# Patient Record
Sex: Female | Born: 1945 | Race: White | Hispanic: No | Smoking: Former smoker
Health system: Southern US, Community
[De-identification: ages and names within clinical notes are randomized; demographics above are authoritative.]

## PROBLEM LIST (undated history)

## (undated) DIAGNOSIS — I509 Heart failure, unspecified: Secondary | ICD-10-CM

## (undated) DIAGNOSIS — C801 Malignant (primary) neoplasm, unspecified: Secondary | ICD-10-CM

## (undated) DIAGNOSIS — I350 Nonrheumatic aortic (valve) stenosis: Secondary | ICD-10-CM

## (undated) DIAGNOSIS — I5033 Acute on chronic diastolic (congestive) heart failure: Secondary | ICD-10-CM

## (undated) DIAGNOSIS — M1990 Unspecified osteoarthritis, unspecified site: Secondary | ICD-10-CM

## (undated) DIAGNOSIS — I272 Pulmonary hypertension, unspecified: Secondary | ICD-10-CM

## (undated) DIAGNOSIS — R0602 Shortness of breath: Secondary | ICD-10-CM

## (undated) DIAGNOSIS — I340 Nonrheumatic mitral (valve) insufficiency: Secondary | ICD-10-CM

## (undated) DIAGNOSIS — E669 Obesity, unspecified: Secondary | ICD-10-CM

## (undated) DIAGNOSIS — J90 Pleural effusion, not elsewhere classified: Secondary | ICD-10-CM

## (undated) DIAGNOSIS — I50810 Right heart failure, unspecified: Secondary | ICD-10-CM

## (undated) DIAGNOSIS — G4733 Obstructive sleep apnea (adult) (pediatric): Secondary | ICD-10-CM

## (undated) DIAGNOSIS — R590 Localized enlarged lymph nodes: Secondary | ICD-10-CM

## (undated) DIAGNOSIS — I2510 Atherosclerotic heart disease of native coronary artery without angina pectoris: Secondary | ICD-10-CM

## (undated) DIAGNOSIS — J40 Bronchitis, not specified as acute or chronic: Secondary | ICD-10-CM

## (undated) DIAGNOSIS — E785 Hyperlipidemia, unspecified: Secondary | ICD-10-CM

## (undated) DIAGNOSIS — I071 Rheumatic tricuspid insufficiency: Secondary | ICD-10-CM

## (undated) DIAGNOSIS — E66811 Obesity, class 1: Secondary | ICD-10-CM

## (undated) DIAGNOSIS — I219 Acute myocardial infarction, unspecified: Secondary | ICD-10-CM

## (undated) DIAGNOSIS — J449 Chronic obstructive pulmonary disease, unspecified: Secondary | ICD-10-CM

## (undated) DIAGNOSIS — Z8719 Personal history of other diseases of the digestive system: Secondary | ICD-10-CM

## (undated) DIAGNOSIS — K219 Gastro-esophageal reflux disease without esophagitis: Secondary | ICD-10-CM

## (undated) DIAGNOSIS — F419 Anxiety disorder, unspecified: Secondary | ICD-10-CM

## (undated) DIAGNOSIS — J189 Pneumonia, unspecified organism: Secondary | ICD-10-CM

## (undated) HISTORY — PX: APPENDECTOMY: SHX54

## (undated) HISTORY — DX: Pulmonary hypertension, unspecified: I27.20

## (undated) HISTORY — DX: Localized enlarged lymph nodes: R59.0

## (undated) HISTORY — PX: HEMORRHOID SURGERY: SHX153

## (undated) HISTORY — DX: Chronic obstructive pulmonary disease, unspecified: J44.9

## (undated) HISTORY — PX: OTHER SURGICAL HISTORY: SHX169

## (undated) HISTORY — PX: LUMBAR DISC SURGERY: SHX700

## (undated) HISTORY — DX: Acute myocardial infarction, unspecified: I21.9

## (undated) HISTORY — DX: Obstructive sleep apnea (adult) (pediatric): G47.33

## (undated) HISTORY — DX: Hyperlipidemia, unspecified: E78.5

## (undated) HISTORY — PX: TONSILLECTOMY AND ADENOIDECTOMY: SUR1326

## (undated) HISTORY — PX: CORONARY ANGIOPLASTY: SHX604

## (undated) HISTORY — PX: EYE SURGERY: SHX253

---

## 2000-11-13 ENCOUNTER — Inpatient Hospital Stay (HOSPITAL_COMMUNITY): Admission: EM | Admit: 2000-11-13 | Discharge: 2000-11-15 | Payer: Self-pay | Admitting: Emergency Medicine

## 2000-11-13 ENCOUNTER — Encounter: Payer: Self-pay | Admitting: Emergency Medicine

## 2000-12-24 ENCOUNTER — Encounter (HOSPITAL_COMMUNITY): Admission: RE | Admit: 2000-12-24 | Discharge: 2001-03-24 | Payer: Self-pay | Admitting: Cardiovascular Disease

## 2001-01-13 ENCOUNTER — Other Ambulatory Visit: Admission: RE | Admit: 2001-01-13 | Discharge: 2001-01-13 | Payer: Self-pay

## 2001-01-20 ENCOUNTER — Encounter: Payer: Self-pay | Admitting: Family Medicine

## 2001-01-20 ENCOUNTER — Encounter: Admission: RE | Admit: 2001-01-20 | Discharge: 2001-01-20 | Payer: Self-pay | Admitting: Family Medicine

## 2001-03-16 ENCOUNTER — Encounter (HOSPITAL_COMMUNITY): Admission: RE | Admit: 2001-03-16 | Discharge: 2001-06-14 | Payer: Self-pay | Admitting: Cardiovascular Disease

## 2002-08-17 ENCOUNTER — Encounter: Payer: Self-pay | Admitting: Family Medicine

## 2002-08-17 ENCOUNTER — Other Ambulatory Visit: Admission: RE | Admit: 2002-08-17 | Discharge: 2002-08-17 | Payer: Self-pay | Admitting: Family Medicine

## 2002-08-17 ENCOUNTER — Encounter: Admission: RE | Admit: 2002-08-17 | Discharge: 2002-08-17 | Payer: Self-pay | Admitting: Family Medicine

## 2002-08-31 ENCOUNTER — Encounter: Payer: Self-pay | Admitting: Family Medicine

## 2003-07-03 ENCOUNTER — Inpatient Hospital Stay (HOSPITAL_COMMUNITY): Admission: EM | Admit: 2003-07-03 | Discharge: 2003-07-04 | Payer: Self-pay | Admitting: Emergency Medicine

## 2004-06-27 ENCOUNTER — Ambulatory Visit: Payer: Self-pay | Admitting: Family Medicine

## 2004-10-03 ENCOUNTER — Ambulatory Visit: Payer: Self-pay | Admitting: Family Medicine

## 2006-02-11 ENCOUNTER — Ambulatory Visit: Payer: Self-pay | Admitting: Family Medicine

## 2006-06-17 ENCOUNTER — Ambulatory Visit: Payer: Self-pay | Admitting: Family Medicine

## 2006-07-17 ENCOUNTER — Ambulatory Visit: Payer: Self-pay | Admitting: Family Medicine

## 2007-03-17 ENCOUNTER — Telehealth: Payer: Self-pay | Admitting: Family Medicine

## 2007-08-04 ENCOUNTER — Ambulatory Visit: Payer: Self-pay | Admitting: Family Medicine

## 2007-08-04 DIAGNOSIS — K219 Gastro-esophageal reflux disease without esophagitis: Secondary | ICD-10-CM

## 2007-08-04 DIAGNOSIS — R609 Edema, unspecified: Secondary | ICD-10-CM

## 2007-08-04 DIAGNOSIS — M1990 Unspecified osteoarthritis, unspecified site: Secondary | ICD-10-CM | POA: Insufficient documentation

## 2007-08-04 DIAGNOSIS — I10 Essential (primary) hypertension: Secondary | ICD-10-CM | POA: Insufficient documentation

## 2007-08-04 DIAGNOSIS — F411 Generalized anxiety disorder: Secondary | ICD-10-CM | POA: Insufficient documentation

## 2007-08-04 DIAGNOSIS — E785 Hyperlipidemia, unspecified: Secondary | ICD-10-CM | POA: Insufficient documentation

## 2007-08-04 DIAGNOSIS — I2510 Atherosclerotic heart disease of native coronary artery without angina pectoris: Secondary | ICD-10-CM | POA: Insufficient documentation

## 2007-08-12 ENCOUNTER — Ambulatory Visit: Payer: Self-pay | Admitting: Family Medicine

## 2007-08-13 ENCOUNTER — Telehealth (INDEPENDENT_AMBULATORY_CARE_PROVIDER_SITE_OTHER): Payer: Self-pay | Admitting: *Deleted

## 2007-08-13 ENCOUNTER — Ambulatory Visit: Payer: Self-pay | Admitting: Family Medicine

## 2007-08-14 ENCOUNTER — Telehealth (INDEPENDENT_AMBULATORY_CARE_PROVIDER_SITE_OTHER): Payer: Self-pay | Admitting: *Deleted

## 2007-08-15 ENCOUNTER — Telehealth: Payer: Self-pay | Admitting: Family Medicine

## 2007-08-18 ENCOUNTER — Ambulatory Visit: Payer: Self-pay | Admitting: Family Medicine

## 2007-08-18 DIAGNOSIS — E119 Type 2 diabetes mellitus without complications: Secondary | ICD-10-CM

## 2007-08-18 DIAGNOSIS — I5032 Chronic diastolic (congestive) heart failure: Secondary | ICD-10-CM

## 2007-08-18 LAB — CONVERTED CEMR LAB
Bilirubin Urine: NEGATIVE
Protein, U semiquant: NEGATIVE
Urobilinogen, UA: 0.2
WBC Urine, dipstick: NEGATIVE

## 2007-08-26 ENCOUNTER — Ambulatory Visit: Payer: Self-pay | Admitting: Cardiology

## 2007-08-29 ENCOUNTER — Telehealth: Payer: Self-pay | Admitting: Family Medicine

## 2007-09-01 ENCOUNTER — Ambulatory Visit: Payer: Self-pay

## 2007-09-01 ENCOUNTER — Encounter: Payer: Self-pay | Admitting: Family Medicine

## 2007-09-01 ENCOUNTER — Encounter: Payer: Self-pay | Admitting: Cardiology

## 2007-09-24 ENCOUNTER — Ambulatory Visit: Payer: Self-pay

## 2007-09-30 ENCOUNTER — Ambulatory Visit: Payer: Self-pay | Admitting: Family Medicine

## 2007-10-02 ENCOUNTER — Telehealth: Payer: Self-pay | Admitting: Family Medicine

## 2007-10-02 LAB — CONVERTED CEMR LAB
Creatinine,U: 110 mg/dL
Hgb A1c MFr Bld: 8.3 % — ABNORMAL HIGH (ref 4.6–6.0)

## 2007-10-06 ENCOUNTER — Ambulatory Visit: Payer: Self-pay | Admitting: Family Medicine

## 2007-10-06 ENCOUNTER — Encounter: Admission: RE | Admit: 2007-10-06 | Discharge: 2007-10-06 | Payer: Self-pay | Admitting: Family Medicine

## 2007-10-07 ENCOUNTER — Encounter: Payer: Self-pay | Admitting: Family Medicine

## 2007-10-27 ENCOUNTER — Ambulatory Visit: Payer: Self-pay | Admitting: Family Medicine

## 2007-11-05 ENCOUNTER — Telehealth: Payer: Self-pay | Admitting: Family Medicine

## 2007-11-12 ENCOUNTER — Telehealth: Payer: Self-pay | Admitting: Family Medicine

## 2008-01-19 ENCOUNTER — Telehealth: Payer: Self-pay | Admitting: Family Medicine

## 2008-02-03 ENCOUNTER — Telehealth: Payer: Self-pay | Admitting: Family Medicine

## 2008-06-15 ENCOUNTER — Ambulatory Visit: Payer: Self-pay | Admitting: Family Medicine

## 2008-07-13 ENCOUNTER — Telehealth: Payer: Self-pay | Admitting: Family Medicine

## 2008-08-03 ENCOUNTER — Telehealth: Payer: Self-pay | Admitting: Family Medicine

## 2008-08-04 ENCOUNTER — Ambulatory Visit: Payer: Self-pay | Admitting: Family Medicine

## 2008-08-04 DIAGNOSIS — R109 Unspecified abdominal pain: Secondary | ICD-10-CM

## 2008-08-04 LAB — CONVERTED CEMR LAB
Blood in Urine, dipstick: NEGATIVE
Glucose, Urine, Semiquant: NEGATIVE
Specific Gravity, Urine: 1.02
WBC Urine, dipstick: NEGATIVE
pH: 5.5

## 2008-12-06 ENCOUNTER — Encounter: Payer: Self-pay | Admitting: Family Medicine

## 2009-02-03 ENCOUNTER — Telehealth (INDEPENDENT_AMBULATORY_CARE_PROVIDER_SITE_OTHER): Payer: Self-pay | Admitting: *Deleted

## 2009-05-03 ENCOUNTER — Ambulatory Visit: Payer: Self-pay | Admitting: Family Medicine

## 2009-05-09 ENCOUNTER — Telehealth: Payer: Self-pay | Admitting: Family Medicine

## 2009-05-09 LAB — CONVERTED CEMR LAB
ALT: 22 units/L (ref 0–35)
Albumin: 3.8 g/dL (ref 3.5–5.2)
Alkaline Phosphatase: 63 units/L (ref 39–117)
Basophils Relative: 0 % (ref 0.0–3.0)
Bilirubin, Direct: 0.1 mg/dL (ref 0.0–0.3)
CO2: 36 meq/L — ABNORMAL HIGH (ref 19–32)
Chloride: 95 meq/L — ABNORMAL LOW (ref 96–112)
Creatinine, Ser: 0.7 mg/dL (ref 0.4–1.2)
Eosinophils Relative: 1.4 % (ref 0.0–5.0)
Hemoglobin: 14 g/dL (ref 12.0–15.0)
MCHC: 32.9 g/dL (ref 30.0–36.0)
MCV: 87.4 fL (ref 78.0–100.0)
Monocytes Absolute: 0.3 10*3/uL (ref 0.1–1.0)
Neutro Abs: 5.5 10*3/uL (ref 1.4–7.7)
Neutrophils Relative %: 68.1 % (ref 43.0–77.0)
Potassium: 3.6 meq/L (ref 3.5–5.1)
Pro B Natriuretic peptide (BNP): 14 pg/mL (ref 0.0–100.0)
RBC: 4.85 M/uL (ref 3.87–5.11)
Sodium: 142 meq/L (ref 135–145)
Total Protein: 7 g/dL (ref 6.0–8.3)
WBC: 8.1 10*3/uL (ref 4.5–10.5)

## 2009-05-13 ENCOUNTER — Ambulatory Visit: Payer: Self-pay | Admitting: Family Medicine

## 2009-05-13 ENCOUNTER — Telehealth: Payer: Self-pay | Admitting: Family Medicine

## 2009-05-17 ENCOUNTER — Ambulatory Visit: Payer: Self-pay

## 2009-05-17 ENCOUNTER — Encounter: Payer: Self-pay | Admitting: Family Medicine

## 2009-05-17 ENCOUNTER — Ambulatory Visit (HOSPITAL_COMMUNITY): Admission: RE | Admit: 2009-05-17 | Discharge: 2009-05-17 | Payer: Self-pay | Admitting: Family Medicine

## 2009-05-17 ENCOUNTER — Ambulatory Visit: Payer: Self-pay | Admitting: Cardiology

## 2009-05-19 ENCOUNTER — Telehealth: Payer: Self-pay | Admitting: Family Medicine

## 2009-05-19 ENCOUNTER — Encounter: Payer: Self-pay | Admitting: Cardiovascular Disease

## 2009-05-19 ENCOUNTER — Ambulatory Visit: Payer: Self-pay

## 2009-05-19 ENCOUNTER — Encounter: Payer: Self-pay | Admitting: Family Medicine

## 2009-05-25 ENCOUNTER — Ambulatory Visit: Payer: Self-pay | Admitting: Cardiology

## 2009-05-25 DIAGNOSIS — I272 Pulmonary hypertension, unspecified: Secondary | ICD-10-CM

## 2009-05-26 ENCOUNTER — Telehealth: Payer: Self-pay | Admitting: Cardiology

## 2009-05-26 ENCOUNTER — Ambulatory Visit: Payer: Self-pay | Admitting: Cardiology

## 2009-05-26 LAB — CONVERTED CEMR LAB
BUN: 11 mg/dL (ref 6–23)
Basophils Absolute: 0 10*3/uL (ref 0.0–0.1)
CO2: 37 meq/L — ABNORMAL HIGH (ref 19–32)
Calcium: 9.5 mg/dL (ref 8.4–10.5)
Chloride: 93 meq/L — ABNORMAL LOW (ref 96–112)
Creatinine, Ser: 0.8 mg/dL (ref 0.4–1.2)
Eosinophils Absolute: 0.1 10*3/uL (ref 0.0–0.7)
HCT: 43.1 % (ref 36.0–46.0)
Lymphs Abs: 1.3 10*3/uL (ref 0.7–4.0)
MCV: 84 fL (ref 78.0–100.0)
Monocytes Absolute: 0.5 10*3/uL (ref 0.1–1.0)
Neutrophils Relative %: 75.3 % (ref 43.0–77.0)
Platelets: 221 10*3/uL (ref 150.0–400.0)
Prothrombin Time: 11.2 s (ref 9.1–11.7)
RDW: 14.5 % (ref 11.5–14.6)

## 2009-05-30 ENCOUNTER — Telehealth: Payer: Self-pay | Admitting: Cardiology

## 2009-05-30 ENCOUNTER — Ambulatory Visit: Payer: Self-pay | Admitting: Cardiology

## 2009-05-30 ENCOUNTER — Ambulatory Visit (HOSPITAL_COMMUNITY): Admission: RE | Admit: 2009-05-30 | Discharge: 2009-05-30 | Payer: Self-pay | Admitting: Cardiology

## 2009-05-30 ENCOUNTER — Inpatient Hospital Stay (HOSPITAL_BASED_OUTPATIENT_CLINIC_OR_DEPARTMENT_OTHER): Admission: RE | Admit: 2009-05-30 | Discharge: 2009-05-30 | Payer: Self-pay | Admitting: Cardiology

## 2009-05-30 DIAGNOSIS — J439 Emphysema, unspecified: Secondary | ICD-10-CM

## 2009-06-01 ENCOUNTER — Ambulatory Visit (HOSPITAL_COMMUNITY): Admission: RE | Admit: 2009-06-01 | Discharge: 2009-06-01 | Payer: Self-pay | Admitting: Cardiology

## 2009-06-01 ENCOUNTER — Encounter: Payer: Self-pay | Admitting: Cardiology

## 2009-06-06 ENCOUNTER — Telehealth: Payer: Self-pay | Admitting: Family Medicine

## 2009-06-09 ENCOUNTER — Telehealth: Payer: Self-pay | Admitting: Cardiology

## 2009-06-10 ENCOUNTER — Ambulatory Visit: Payer: Self-pay | Admitting: Cardiology

## 2009-06-10 ENCOUNTER — Ambulatory Visit: Payer: Self-pay | Admitting: Pulmonary Disease

## 2009-06-10 DIAGNOSIS — E662 Morbid (severe) obesity with alveolar hypoventilation: Secondary | ICD-10-CM

## 2009-06-10 DIAGNOSIS — G4733 Obstructive sleep apnea (adult) (pediatric): Secondary | ICD-10-CM | POA: Insufficient documentation

## 2009-06-10 LAB — CONVERTED CEMR LAB
BUN: 25 mg/dL — ABNORMAL HIGH (ref 6–23)
Calcium: 9.8 mg/dL (ref 8.4–10.5)
Creatinine, Ser: 0.8 mg/dL (ref 0.40–1.20)
Glucose, Bld: 61 mg/dL — ABNORMAL LOW (ref 70–99)

## 2009-06-13 ENCOUNTER — Encounter: Payer: Self-pay | Admitting: Pulmonary Disease

## 2009-06-13 ENCOUNTER — Ambulatory Visit: Admission: RE | Admit: 2009-06-13 | Discharge: 2009-06-13 | Payer: Self-pay | Admitting: Pulmonary Disease

## 2009-06-14 ENCOUNTER — Telehealth: Payer: Self-pay | Admitting: Pulmonary Disease

## 2009-06-14 ENCOUNTER — Telehealth (INDEPENDENT_AMBULATORY_CARE_PROVIDER_SITE_OTHER): Payer: Self-pay | Admitting: *Deleted

## 2009-06-14 ENCOUNTER — Ambulatory Visit: Payer: Self-pay | Admitting: Cardiology

## 2009-06-15 ENCOUNTER — Encounter: Payer: Self-pay | Admitting: Pulmonary Disease

## 2009-06-15 ENCOUNTER — Telehealth (INDEPENDENT_AMBULATORY_CARE_PROVIDER_SITE_OTHER): Payer: Self-pay | Admitting: *Deleted

## 2009-06-19 ENCOUNTER — Encounter: Payer: Self-pay | Admitting: Pulmonary Disease

## 2009-06-19 ENCOUNTER — Ambulatory Visit (HOSPITAL_BASED_OUTPATIENT_CLINIC_OR_DEPARTMENT_OTHER): Admission: RE | Admit: 2009-06-19 | Discharge: 2009-06-19 | Payer: Self-pay | Admitting: Pulmonary Disease

## 2009-06-20 ENCOUNTER — Telehealth: Payer: Self-pay | Admitting: Family Medicine

## 2009-06-20 LAB — CONVERTED CEMR LAB
Calcium: 9.8 mg/dL (ref 8.4–10.5)
GFR calc non Af Amer: 76.46 mL/min (ref >60)
Glucose, Bld: 96 mg/dL (ref 70–99)
Sodium: 138 meq/L (ref 135–145)

## 2009-06-22 ENCOUNTER — Ambulatory Visit: Payer: Self-pay | Admitting: Family Medicine

## 2009-06-24 ENCOUNTER — Ambulatory Visit: Payer: Self-pay | Admitting: Family Medicine

## 2009-06-24 ENCOUNTER — Telehealth: Payer: Self-pay | Admitting: Family Medicine

## 2009-07-06 ENCOUNTER — Ambulatory Visit: Payer: Self-pay | Admitting: Pulmonary Disease

## 2009-07-06 ENCOUNTER — Telehealth (INDEPENDENT_AMBULATORY_CARE_PROVIDER_SITE_OTHER): Payer: Self-pay | Admitting: *Deleted

## 2009-07-12 ENCOUNTER — Telehealth (INDEPENDENT_AMBULATORY_CARE_PROVIDER_SITE_OTHER): Payer: Self-pay | Admitting: *Deleted

## 2009-07-13 ENCOUNTER — Ambulatory Visit: Payer: Self-pay | Admitting: Pulmonary Disease

## 2009-07-18 ENCOUNTER — Telehealth (INDEPENDENT_AMBULATORY_CARE_PROVIDER_SITE_OTHER): Payer: Self-pay | Admitting: *Deleted

## 2009-07-18 ENCOUNTER — Telehealth: Payer: Self-pay | Admitting: Pulmonary Disease

## 2009-07-21 ENCOUNTER — Ambulatory Visit: Payer: Self-pay | Admitting: Family Medicine

## 2009-07-25 ENCOUNTER — Ambulatory Visit: Payer: Self-pay | Admitting: Cardiology

## 2009-07-30 ENCOUNTER — Encounter: Payer: Self-pay | Admitting: Cardiology

## 2009-08-08 ENCOUNTER — Telehealth: Payer: Self-pay | Admitting: Cardiology

## 2009-08-12 ENCOUNTER — Ambulatory Visit: Payer: Self-pay

## 2009-08-12 ENCOUNTER — Encounter: Payer: Self-pay | Admitting: Cardiology

## 2009-08-12 ENCOUNTER — Telehealth (INDEPENDENT_AMBULATORY_CARE_PROVIDER_SITE_OTHER): Payer: Self-pay | Admitting: *Deleted

## 2009-08-17 ENCOUNTER — Ambulatory Visit: Payer: Self-pay | Admitting: Pulmonary Disease

## 2009-08-17 DIAGNOSIS — R0902 Hypoxemia: Secondary | ICD-10-CM | POA: Insufficient documentation

## 2009-08-17 DIAGNOSIS — M79609 Pain in unspecified limb: Secondary | ICD-10-CM | POA: Insufficient documentation

## 2009-08-18 ENCOUNTER — Telehealth: Payer: Self-pay | Admitting: Family Medicine

## 2009-08-30 ENCOUNTER — Encounter: Payer: Self-pay | Admitting: Pulmonary Disease

## 2009-08-30 ENCOUNTER — Ambulatory Visit (HOSPITAL_BASED_OUTPATIENT_CLINIC_OR_DEPARTMENT_OTHER): Admission: RE | Admit: 2009-08-30 | Discharge: 2009-08-30 | Payer: Self-pay | Admitting: Pulmonary Disease

## 2009-09-26 ENCOUNTER — Telehealth: Payer: Self-pay | Admitting: Pulmonary Disease

## 2009-10-04 ENCOUNTER — Ambulatory Visit: Payer: Self-pay | Admitting: Pulmonary Disease

## 2009-10-12 ENCOUNTER — Telehealth: Payer: Self-pay | Admitting: Pulmonary Disease

## 2009-10-14 ENCOUNTER — Telehealth: Payer: Self-pay | Admitting: Cardiology

## 2009-10-14 ENCOUNTER — Encounter: Payer: Self-pay | Admitting: Cardiology

## 2009-10-17 ENCOUNTER — Encounter: Payer: Self-pay | Admitting: Cardiology

## 2009-11-07 ENCOUNTER — Ambulatory Visit: Payer: Self-pay | Admitting: Cardiology

## 2009-11-07 LAB — CONVERTED CEMR LAB
AST: 26 units/L (ref 0–37)
Chloride: 100 meq/L (ref 96–112)
Cholesterol: 206 mg/dL — ABNORMAL HIGH (ref 0–200)
Creatinine, Ser: 0.9 mg/dL (ref 0.4–1.2)
Pro B Natriuretic peptide (BNP): 3.7 pg/mL (ref 0.0–100.0)
Sodium: 141 meq/L (ref 135–145)
Total Bilirubin: 0.6 mg/dL (ref 0.3–1.2)
Total CHOL/HDL Ratio: 5
Triglycerides: 213 mg/dL — ABNORMAL HIGH (ref 0.0–149.0)
VLDL: 42.6 mg/dL — ABNORMAL HIGH (ref 0.0–40.0)

## 2009-11-10 ENCOUNTER — Encounter: Payer: Self-pay | Admitting: Pulmonary Disease

## 2009-11-14 ENCOUNTER — Ambulatory Visit: Payer: Self-pay | Admitting: Cardiology

## 2009-11-21 ENCOUNTER — Encounter: Payer: Self-pay | Admitting: Pulmonary Disease

## 2009-12-08 ENCOUNTER — Encounter: Payer: Self-pay | Admitting: Pulmonary Disease

## 2009-12-20 ENCOUNTER — Encounter: Payer: Self-pay | Admitting: Pulmonary Disease

## 2010-01-16 ENCOUNTER — Ambulatory Visit: Payer: Self-pay | Admitting: Pulmonary Disease

## 2010-01-16 ENCOUNTER — Ambulatory Visit: Payer: Self-pay | Admitting: Cardiology

## 2010-01-18 LAB — CONVERTED CEMR LAB
ALT: 24 units/L (ref 0–35)
Albumin: 3.8 g/dL (ref 3.5–5.2)
Alkaline Phosphatase: 64 units/L (ref 39–117)
Cholesterol: 159 mg/dL (ref 0–200)
LDL Cholesterol: 98 mg/dL (ref 0–99)
Total Protein: 6.6 g/dL (ref 6.0–8.3)
Triglycerides: 131 mg/dL (ref 0.0–149.0)
VLDL: 26.2 mg/dL (ref 0.0–40.0)

## 2010-01-22 ENCOUNTER — Encounter: Payer: Self-pay | Admitting: Pulmonary Disease

## 2010-02-08 ENCOUNTER — Telehealth: Payer: Self-pay | Admitting: Cardiology

## 2010-03-01 ENCOUNTER — Telehealth: Payer: Self-pay | Admitting: Family Medicine

## 2010-04-10 ENCOUNTER — Ambulatory Visit: Payer: Self-pay | Admitting: Pulmonary Disease

## 2010-04-12 ENCOUNTER — Ambulatory Visit: Payer: Self-pay | Admitting: Cardiology

## 2010-04-17 ENCOUNTER — Telehealth: Payer: Self-pay | Admitting: Cardiology

## 2010-04-17 LAB — CONVERTED CEMR LAB
Albumin: 4.2 g/dL (ref 3.5–5.2)
Alkaline Phosphatase: 64 units/L (ref 39–117)
Bilirubin, Direct: 0.1 mg/dL (ref 0.0–0.3)
HDL: 38.5 mg/dL — ABNORMAL LOW (ref >39.00)
Total Protein: 7.1 g/dL (ref 6.0–8.3)
Triglycerides: 176 mg/dL — ABNORMAL HIGH (ref 0.0–149.0)
VLDL: 35.2 mg/dL (ref 0.0–40.0)

## 2010-04-27 ENCOUNTER — Ambulatory Visit: Payer: Self-pay

## 2010-04-27 ENCOUNTER — Encounter: Payer: Self-pay | Admitting: Cardiology

## 2010-04-27 ENCOUNTER — Ambulatory Visit (HOSPITAL_COMMUNITY)
Admission: RE | Admit: 2010-04-27 | Discharge: 2010-04-27 | Payer: Self-pay | Source: Home / Self Care | Admitting: Cardiology

## 2010-04-27 ENCOUNTER — Ambulatory Visit: Payer: Self-pay | Admitting: Cardiology

## 2010-05-31 ENCOUNTER — Encounter: Payer: Self-pay | Admitting: Family Medicine

## 2010-06-08 ENCOUNTER — Encounter: Payer: Self-pay | Admitting: Pulmonary Disease

## 2010-06-12 ENCOUNTER — Encounter (HOSPITAL_COMMUNITY)
Admission: RE | Admit: 2010-06-12 | Discharge: 2010-06-27 | Payer: Self-pay | Source: Home / Self Care | Attending: Pulmonary Disease | Admitting: Pulmonary Disease

## 2010-06-25 LAB — CONVERTED CEMR LAB
ALT: 26 units/L (ref 0–35)
Alkaline Phosphatase: 85 units/L (ref 39–117)
BUN: 15 mg/dL (ref 6–23)
Basophils Relative: 0.2 % (ref 0.0–1.0)
Bilirubin, Direct: 0.2 mg/dL (ref 0.0–0.3)
CO2: 35 meq/L — ABNORMAL HIGH (ref 19–32)
CO2: 39 meq/L — ABNORMAL HIGH (ref 19–32)
Calcium: 9.1 mg/dL (ref 8.4–10.5)
Calcium: 9.2 mg/dL (ref 8.4–10.5)
Chloride: 90 meq/L — ABNORMAL LOW (ref 96–112)
Creatinine, Ser: 0.7 mg/dL (ref 0.4–1.2)
Creatinine, Ser: 1 mg/dL (ref 0.4–1.2)
Eosinophils Relative: 1.9 % (ref 0.0–5.0)
GFR calc Af Amer: 109 mL/min
Glucose, Bld: 214 mg/dL — ABNORMAL HIGH (ref 70–99)
Hemoglobin: 13.8 g/dL (ref 12.0–15.0)
Lymphocytes Relative: 28.4 % (ref 12.0–46.0)
Monocytes Absolute: 0.3 10*3/uL (ref 0.2–0.7)
Neutro Abs: 5.2 10*3/uL (ref 1.4–7.7)
Potassium: 3.7 meq/L (ref 3.5–5.1)
Pro B Natriuretic peptide (BNP): 11.9 pg/mL (ref 0.0–100.0)
RDW: 14.3 % (ref 11.5–14.6)
Total Bilirubin: 0.8 mg/dL (ref 0.3–1.2)
Total Protein: 6.1 g/dL (ref 6.0–8.3)
WBC: 7.9 10*3/uL (ref 4.5–10.5)

## 2010-06-27 NOTE — Assessment & Plan Note (Signed)
Summary: 6 WKS/D.MILLER      Allergies Added:   Referring Provider:  Marca Ancona Primary Provider:  Nelwyn Salisbury MD  CC:  6 week follow up/D. Miller.  Pt states even with oxygen she is still winded walking in today.  She says that sometimes it even seems worse.  She has decided to see Dr. Craige Cotta instead of Dr. Shelle Iron.Marland Kitchen  History of Present Illness: 65 yo with history of CAD s/p RCA PCI in 2002 and CHF with peripheral edema presented initially for evaluation of shortness of breath and edema.  Patient had significant peripheral edema in 2009.  Workup at that time revealed a normal stress test and preserved EF on echo. Patient was treated with Lasix and swelling improved.  She redeveloped significant lower extremity edema over the last 2-3 months.  She got to the point where she was short of breath with pulling her trash can to the street or walking the equivalent of about 1/2 a city block.  Echo was done showing RV failure and pulmonary hypertension.   We found her oxygen saturation to be low on room air at rest and with exertion.  Right heart catheterization showed moderate pulmonary hypertension that responded well to oxygen with decreased PA pressure.  She had PFTs that suggested COPD and was diagnosed with OHS/OSA by the pulmonary service.  She is going back for CPAP titration.    On oxygen, she has felt better and is less short of breath.  She is using oxygen most of the time and is sleeping with it.  She is still short of breath after walking 50-100 feet.  She is out of work now.  She continues to stay off cigarettes.  Her weight is up 1 lb since last appointment.  Her peripheral edema has gone down but she is eating more off cigarettes.    Labs (12/10): creatinine 0.8, BNP 73, TSH normal, HCT 42.4, ANA negative Labs (1/11): K 4.4, creatinine 0.8  Current Medications (verified): 1)  Prevacid 15 Mg Cpdr (Lansoprazole) .Marland Kitchen.. 1-2 Caps Once Daily 2)  Simvastatin 80 Mg Tabs (Simvastatin) .Marland Kitchen.. 1  By Mouth Once Daily 3)  Furosemide 40 Mg  Tabs (Furosemide) .... Take 2 Tabs By Mouth Two Times A Day 4)  Bayer Low Strength 81 Mg  Tbec (Aspirin) .... Once Daily 5)  Metformin Hcl 1000 Mg  Tabs (Metformin Hcl) .Marland Kitchen.. 1 Tab  By Mouth Two Times A Day 6)  Accu-Chek Aviva  Soln (Blood Glucose Calibration) .... Check Once Daily 7)  Tessalon Perles 100 Mg Caps (Benzonatate) .Marland Kitchen.. 1 Q 4 Hours As Needed Cough 8)  Xanax 0.5 Mg Tabs (Alprazolam) .... One Tablet By Mouth Three Times A Day 9)  Neurontin 300 Mg Caps (Gabapentin) .... 3  Times A Day 10)  Glipizide 10 Mg Tabs (Glipizide) .Marland Kitchen.. 1 By Mouth Two Times A Day 11)  Spironolactone 50 Mg Tabs (Spironolactone) .... Take 1 Tablet By Mouth One  Time A Day 12)  Zyrtec Allergy 10 Mg Caps (Cetirizine Hcl) .... As Needed 13)  Mucinex 600 Mg Xr12h-Tab (Guaifenesin) .... As Needed 14)  Aleve 220 Mg Tabs (Naproxen Sodium) .... As Needed 15)  Chantix Continuing Month Pak 1 Mg Tabs (Varenicline Tartrate) .... Use As Directed 16)  Lancets  Misc (Lancets) .... Once Daily 17)  Symbicort 160-4.5 Mcg/act  Aero (Budesonide-Formoterol Fumarate) .... Two Puffs Twice Daily 18)  Ventolin Hfa 108 (90 Base) Mcg/act  Aers (Albuterol Sulfate) .... 2 Puffs Every 4-6 Hours As  Needed 19)  Oxygen .... 2l For 24 Hours  Allergies (verified): 1)  ! Codeine  Past History:  Past Medical History: 1. GERD 2. Hyperlipidemia 3. Hypertension 4. CAD: MI 2002 with PCI to proximal and distal RCA.  Adenosine myoview (4/09) with EF 72%, no ischemia or infarction. 5. Diabetes mellitus, type II 6. CHF: Echo (4/09) showed EF 60-65% with mild diastolic dysfunction.  Echo (12/10) showed mild LVH, EF 60-65%, mild AS (mean gradient 13 mmHg), mildly dilated RV with mild to moderate RV systolic dysfunction, and PASP 59 mmHg (moderate pulmonary hypertension).  7.  COPD: Has quit smoking.   8.  OSA/obesity-hypoventilation 9.  Pulmonary hypertension: Suspect secondary to hypoxic pulmonary  vasoconstriction (COPD, OHS/OSA).  RHC (1/11) showed mean RA pressure 15, PA 57/42 mean 41 on room air and PA 43/24 mean 32 on nonrebreather mask, PCWP 15, SVO2 51%, CI 1.9.   ANA was negative and V/Q scan did not show evidence for chronic PE.    Family History: Reviewed history from 06/14/2009 and no changes required. 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 pt has no children. pt started smoking at age 66s.  1 1/2 ppd for years but quit 1/11 using Chantix.   Review of Systems       All systems reviewed and negative except as per HPI.   Vital Signs:  Patient profile:   65 year old female Height:      66 inches Weight:      214 pounds BMI:     34.67 O2 Sat:      91 % on Room air Pulse rate:   95 / minute Pulse rhythm:   regular BP sitting:   98 / 64  (left arm) Cuff size:   regular  Vitals Entered By: [name] CMA (July 25, 2009 9:49 AM)  O2 Flow:  Room air  Physical Exam  General:  Well developed, well nourished, in no acute distress. Neck:  Neck thick, JVP difficult. No masses, thyromegaly or abnormal cervical nodes. Lungs:  Clear bilaterally to auscultation and percussion. Heart:  Somewhat distant heart sounds.  Non-displaced PMI, chest non-tender; regular rate and rhythm, S1, S2 without murmurs, rubs or gallops. Carotid upstroke normal, no bruit. Pedals normal pulses. No edema, no varicosities. Abdomen:  Bowel sounds positive; abdomen soft and non-tender without masses, organomegaly, or hernias noted. No hepatosplenomegaly. Extremities:  No clubbing or cyanosis. Neurologic:  Alert and oriented x 3. Psych:  Normal affect.   Impression &  Recommendations:  Problem # 1:  PULMONARY HYPERTENSION (ICD-416.8) Patient has moderate pulmonary hyperetnsion by echo and cath as well as a dilated RV.  I suspect that this is primarily due to hypoxic pulmonary vasoconstriction from COPD and OSA/OHS.  V/Q scan showed no evidence of chronic PE.  On right heart cath, her mean PA pressure decreased from 41 mmHg to 32 mmHg with oxygen.  She is now on oxygen at all times.  She is going to undergo CPAP titration. Right heart hemodynamics should improve over time on oxygen.  Will continue current diuretics for now.  Will check BNP and BMET prior to next appointment.   Problem # 2:  CORONARY ARTERY DISEASE (ICD-414.00) No chest pain, normal LV systolic function on echo.  Continue  ASA and statin.  Will need fasting lipids/LFTs prior to next appointment.    Patient Instructions: 1)  Your physician recommends that you schedule a follow-up appointment in: 4 months with Dr Shirlee Latch. 2)  Your physician recommends that you return for lab work  a few days before the appointment in 4 months with Dr McLean--BMP/BNP 416.8

## 2010-06-27 NOTE — Procedures (Signed)
Summary: Oximetry/[facility]  Oximetry/[facility]   Imported By: Lester [place] 02/06/2010 10:57:19  _____________________________________________________________________  External Attachment:    Type:   Image     Comment:   External Document

## 2010-06-27 NOTE — Letter (Signed)
Summary: Physician's Statement  Physician's Statement   Imported By: Marylou Mccoy 11/29/2009 11:33:11  _____________________________________________________________________  External Attachment:    Type:   Image     Comment:   External Document

## 2010-06-27 NOTE — Assessment & Plan Note (Signed)
Summary: fill out forms/dm   Vital Signs:  Patient profile:   65 year old female Weight:      217 pounds Temp:     97.8 degrees F oral BP sitting:   102 / 66  (left arm) Cuff size:   large  Vitals Entered By: Alfred Levins, CMA (June 22, 2009 3:34 PM) CC: hypotensive   History of Present Illness: Here to discuss forms that she needs to be filled out. First she needs me to certify that she has been totally unable to work since 05-13-09, which was the day i saw her in clinic with severe SOB and leg swelling. At that point we ordered an ECHO and LE venous dopplers, and we found evidence of right sided heart failure and pulmonary HTN. She has been seeing Dr. Shirlee Latch and Dr. Shelle Iron for these problems. Also she needs a handicapped form filled out for the [facility]. Sh eis wearing continuous oxygen, and this has helped a great deal. She is proud to tell me that she quit smoking over a week ago and intends to stay off tobacco.   Current Medications (verified): 1)  Prevacid 15 Mg Cpdr (Lansoprazole) .Marland Kitchen.. 1-2 Caps Once Daily 2)  Simvastatin 80 Mg Tabs (Simvastatin) .Marland Kitchen.. 1 By Mouth Once Daily 3)  Furosemide 40 Mg  Tabs (Furosemide) .... Take 2 Tabs By Mouth Two Times A Day 4)  Bayer Low Strength 81 Mg  Tbec (Aspirin) .... Once Daily 5)  Metformin Hcl 1000 Mg  Tabs (Metformin Hcl) .Marland Kitchen.. 1 Tab  By Mouth Two Times A Day 6)  Accu-Chek Aviva  Soln (Blood Glucose Calibration) .... Check Once Daily 7)  Tessalon Perles 100 Mg Caps (Benzonatate) .Marland Kitchen.. 1 Q 4 Hours As Needed Cough 8)  Xanax 0.5 Mg Tabs (Alprazolam) .... One Tablet By Mouth Three Times A Day 9)  Neurontin 300 Mg Caps (Gabapentin) .... 3  Times A Day 10)  Glipizide 10 Mg Tabs (Glipizide) .Marland Kitchen.. 1 By Mouth Two Times A Day 11)  Vicodin 5-500 Mg Tabs (Hydrocodone-Acetaminophen) .Marland Kitchen.. 1 Q 6 Hours As Needed Pain 12)  Spironolactone 50 Mg Tabs (Spironolactone) .... Take 1 Tablet By Mouth One  Time A Day 13)  Zyrtec Allergy 10 Mg Caps (Cetirizine Hcl) .... As  Needed 14)  Mucinex 600 Mg Xr12h-Tab (Guaifenesin) .... As Needed 15)  Aleve 220 Mg Tabs (Naproxen Sodium) .... As Needed 16)  Spiriva Handihaler 18 Mcg Caps (Tiotropium Bromide Monohydrate) .... Use Daily As Directed 17)  Chantix Starting Month Pak 0.5 Mg X 11 & 1 Mg X 42 Tabs (Varenicline Tartrate) .... Use As Directed 18)  Chantix Continuing Month Pak 1 Mg Tabs (Varenicline Tartrate) .... Use As Directed 19)  Lancets  Misc (Lancets) .... Once Daily 20)  Symbicort 160-4.5 Mcg/act  Aero (Budesonide-Formoterol Fumarate) .... Two Puffs Twice Daily 21)  Ventolin Hfa 108 (90 Base) Mcg/act  Aers (Albuterol Sulfate) .... 2 Puffs Every 4-6 Hours As Needed 22)  Oxygen .... 2l For 24 Hours 23)  Spiriva Handihaler 18 Mcg  Caps (Tiotropium Bromide Monohydrate) .... Contents of One Capsule Inhaled Daily  Allergies (verified): 1)  ! Codeine  Past History:  Past Medical History: 1. GERD 2. Hyperlipidemia 3. Hypertension 4. CAD: MI 2002 with PCI to proximal and distal RCA.  Adenosine myoview (4/09) with EF 72%, no ischemia or infarction. Sees Dr. Marca Ancona 5. Diabetes mellitus, type II 6. CHF: Echo (4/09) showed EF 60-65% with mild diastolic dysfunction.  Echo (12/10) showed mild LVH, EF  60-65%, mild AS (mean gradient 13 mmHg), mildly dilated RV with mild to moderate RV systolic dysfunction, and PASP 59 mmHg (moderate pulmonary hypertension).  7.  COPD, sees Dr. Marcelyn Bruins  8.  Possible OSA/obesity-hypoventilation 9.  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 OHS).  RHC (1/11) showed mean RA pressure 15, PA 57/42 mean 41 on room air and PA 43/24 mean 32 on nonrebreather mask, PCWP 15, SVO2 51%, CI 1.9.   ANA was negative and V/Q scan did not show evidence for chronic PE.    Past Surgical History: Reviewed history from 06/10/2009 and no changes required. Lumbar disk surgery 1985 Appendectomy 1960s Hemorrhoidectomy Tonsillectomy and  adenoidectomy colonoscopy 09-23-03 per Dr. Corinda Gubler, repeat 5 yrs stents 2002  Review of Systems  The patient denies anorexia, fever, weight loss, weight gain, vision loss, decreased hearing, hoarseness, chest pain, syncope, peripheral edema, prolonged cough, headaches, hemoptysis, abdominal pain, melena, hematochezia, severe indigestion/heartburn, hematuria, incontinence, genital sores, muscle weakness, suspicious skin lesions, transient blindness, difficulty walking, depression, unusual weight change, abnormal bleeding, enlarged lymph nodes, angioedema, breast masses, and testicular masses.    Physical Exam  General:  Well-developed,well-nourished,in no acute distress; alert,appropriate and cooperative throughout examination, wearing O2 Lungs:  Normal respiratory effort, chest expands symmetrically. Lungs are clear to auscultation, no crackles or wheezes. Heart:  Normal rate and regular rhythm. S1 and S2 normal without gallop, murmur, click, rub or other extra sounds. Extremities:  2+ left pedal edema and 2+ right pedal edema.     Impression & Recommendations:  Problem # 1:  COPD (ICD-496)  The following medications were removed from the medication list:    Spiriva Handihaler 18 Mcg Caps (Tiotropium bromide monohydrate) .Marland Kitchen... Contents of one capsule inhaled daily Her updated medication list for this problem includes:    Spiriva Handihaler 18 Mcg Caps (Tiotropium bromide monohydrate) ..... Use daily as directed    Symbicort 160-4.5 Mcg/act Aero (Budesonide-formoterol fumarate) .Marland Kitchen..Marland Kitchen Two puffs twice daily    Ventolin Hfa 108 (90 Base) Mcg/act Aers (Albuterol sulfate) .Marland Kitchen... 2 puffs every 4-6 hours as needed  Problem # 2:  PULMONARY HYPERTENSION (ICD-416.8)  Problem # 3:  CONGESTIVE HEART FAILURE (ICD-428.0)  Her updated medication list for this problem includes:    Furosemide 40 Mg Tabs (Furosemide) .Marland Kitchen... Take 2 tabs by mouth two times a day    Bayer Low Strength 81 Mg Tbec (Aspirin)  ..... Once daily    Spironolactone 50 Mg Tabs (Spironolactone) .Marland Kitchen... Take 1 tablet by mouth one  time a day  Problem # 4:  DEPENDENT EDEMA (ICD-782.3)  Her updated medication list for this problem includes:    Furosemide 40 Mg Tabs (Furosemide) .Marland Kitchen... Take 2 tabs by mouth two times a day    Spironolactone 50 Mg Tabs (Spironolactone) .Marland Kitchen... Take 1 tablet by mouth one  time a day  Complete Medication List: 1)  Prevacid 15 Mg Cpdr (Lansoprazole) .Marland Kitchen.. 1-2 caps once daily 2)  Simvastatin 80 Mg Tabs (Simvastatin) .Marland Kitchen.. 1 by mouth once daily 3)  Furosemide 40 Mg Tabs (Furosemide) .... Take 2 tabs by mouth two times a day 4)  Bayer Low Strength 81 Mg Tbec (Aspirin) .... Once daily 5)  Metformin Hcl 1000 Mg Tabs (Metformin hcl) .Marland Kitchen.. 1 tab  by mouth two times a day 6)  Accu-chek Aviva Soln (Blood glucose calibration) .... Check once daily 7)  Tessalon Perles 100 Mg Caps (Benzonatate) .Marland Kitchen.. 1 q 4 hours as needed cough 8)  Xanax 0.5 Mg Tabs (  Alprazolam) .... One tablet by mouth three times a day 9)  Neurontin 300 Mg Caps (Gabapentin) .... 3  times a day 10)  Glipizide 10 Mg Tabs (Glipizide) .Marland Kitchen.. 1 by mouth two times a day 11)  Vicodin 5-500 Mg Tabs (Hydrocodone-acetaminophen) .Marland Kitchen.. 1 q 6 hours as needed pain 12)  Spironolactone 50 Mg Tabs (Spironolactone) .... Take 1 tablet by mouth one  time a day 13)  Zyrtec Allergy 10 Mg Caps (Cetirizine hcl) .... As needed 14)  Mucinex 600 Mg Xr12h-tab (Guaifenesin) .... As needed 15)  Aleve 220 Mg Tabs (Naproxen sodium) .... As needed 16)  Spiriva Handihaler 18 Mcg Caps (Tiotropium bromide monohydrate) .... Use daily as directed 17)  Chantix Starting Month Pak 0.5 Mg X 11 & 1 Mg X 42 Tabs (Varenicline tartrate) .... Use as directed 18)  Chantix Continuing Month Pak 1 Mg Tabs (Varenicline tartrate) .... Use as directed 19)  Lancets Misc (Lancets) .... Once daily 20)  Symbicort 160-4.5 Mcg/act Aero (Budesonide-formoterol fumarate) .... Two puffs twice daily 21)   Ventolin Hfa 108 (90 Base) Mcg/act Aers (Albuterol sulfate) .... 2 puffs every 4-6 hours as needed 22)  Oxygen  .... 2l for 24 hours  Patient Instructions: 1)  She seems to be doing fairly well. I congratulated her on staying off tobacco. Forms were filled out as above.  2)  Please schedule a follow-up appointment in 1 month.   Appended Document: fill out forms/dm her COPD, pulmonary HTN, CHF, and dependent edema are all stable and unchanged

## 2010-06-27 NOTE — Assessment & Plan Note (Signed)
Summary: switch from [facility] to VS/ COPD and SLEEP APNEA/mg   Copy to:  Marca Ancona Primary Provider/Referring Provider:  Nelwyn Salisbury MD  CC:  Former Dr. Shelle Iron patient switching to Dr. Craige Cotta for sleep apnea and COPD.  The patient c/o increased sob with any exertion and says she is not wearing CPAP.Marland Kitchen  History of Present Illness: 65 yo female with COPD, Tobacco abuse, OSA, hypoxemia, and secondary pulmonary hypertension.  She was initially seen by Dr. Shelle Iron for these problems, but has requested a change of pulmonologists.  She has an extensive prior history of smoking, but quit in December 2010 after using chantix.  Her breathing has improved since then, and with the addition of inhaler therapy.  She still gets winded, but does not have much cough anymore.  Her wheeze has also improved.  She does not have much sputum, and denies hemoptysis.  She has not been having fever or sweats.  She does get allergies in the Spring.  She has not needed to use her ventolin much.  She was to be set up for an autoCPAP as an outpatient.  She has some type of reaction to the mask when being fitted, and as a result has not been started on CPAP yet.  She feels sleepy during the day, and gets tired very easily.  She says her mood is okay.  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  She c/o having pain in her legs when she walks.  Echo from 05/17/09: EF 60%, grade 1 diastolic dysfx, mild AS Rt heart cath 05/30/09: PA 57/29, PAOP 15 V/Q scan 06/01/09: low probability for PE, mild obstruction ABG 06/13/09: 7.44/50.6/55 on RA PFT 06/13/09: FEV1 1.96(76%), FVC 2.70(79%), FEV1% 73, TLC 5.39(101%), DLCO 65%, +BD PSG 06/19/09: AHI 13   Preventive Screening-Counseling & Management  Alcohol-Tobacco     Smoking Status: quit < 6 months     Year Quit: Jan 2011     Pack years: 35 years x2 ppd  Current Medications (verified): 1)  Prevacid 15 Mg Cpdr (Lansoprazole) .Marland Kitchen.. 1-2 Caps  Once Daily 2)  Simvastatin 80 Mg Tabs (Simvastatin) .Marland Kitchen.. 1 By Mouth Once Daily 3)  Furosemide 40 Mg  Tabs (Furosemide) .... Take 2 Tabs By Mouth Two Times A Day 4)  Bayer Low Strength 81 Mg  Tbec (Aspirin) .... Once Daily 5)  Metformin Hcl 1000 Mg  Tabs (Metformin Hcl) .Marland Kitchen.. 1 Tab  By Mouth Two Times A Day 6)  Accu-Chek Aviva  Soln (Blood Glucose Calibration) .... Check Once Daily 7)  Xanax 0.5 Mg Tabs (Alprazolam) .... One Tablet By Mouth Three Times A Day 8)  Neurontin 300 Mg Caps (Gabapentin) .... 3  Times A Day 9)  Glipizide 10 Mg Tabs (Glipizide) .Marland Kitchen.. 1 By Mouth Two Times A Day 10)  Spironolactone 50 Mg Tabs (Spironolactone) .... Take One Tablet Two Times A Day 11)  Zyrtec Allergy 10 Mg Caps (Cetirizine Hcl) .... As Needed 12)  Aleve 220 Mg Tabs (Naproxen Sodium) .... As Needed 13)  Chantix Continuing Month Pak 1 Mg Tabs (Varenicline Tartrate) .... Use As Directed 14)  Lancets  Misc (Lancets) .... Once Daily 15)  Symbicort 160-4.5 Mcg/act  Aero (Budesonide-Formoterol Fumarate) .... Two Puffs Twice Daily 16)  Ventolin Hfa 108 (90 Base) Mcg/act  Aers (Albuterol Sulfate) .... 2 Puffs Every 4-6 Hours As Needed 17)  Oxygen .... 2l For 24 Hours 18)  Spiriva Handihaler 18 Mcg Caps (Tiotropium Bromide  Monohydrate) .Marland Kitchen.. 1 Capsule in Handihaler Daily 19)  Hydrocodone-Acetaminophen 5-500 Mg Tabs (Hydrocodone-Acetaminophen) .Marland Kitchen.. 1 By Mouth Every 6 Hours As Needed  Allergies (verified): 1)  ! Codeine  Past History:  Past Medical History: 1. GERD 2. Hyperlipidemia 3. Hypertension 4. CAD: MI 2002 with PCI to proximal and distal RCA.  Adenosine myoview (4/09) with EF 72%, no ischemia or infarction. 5. Diabetes mellitus, type II 6. CHF: Echo (4/09) showed EF 60-65% with mild diastolic dysfunction.  Echo (12/10) showed mild LVH, EF 60-65%, mild AS (mean gradient 13 mmHg), mildly dilated RV with mild to moderate RV systolic dysfunction, and PASP 59 mmHg (moderate pulmonary hypertension).  7.  COPD:  Has quit smoking.          - PFT 06/13/09 FEV1 1.97(76%), FVC 2.70(79%), FEV1% 73, TLC 5.35(101%), DLCO 65% 8.  OSA/obesity-hypoventilation        - PSG 06/19/09 AHI 13        - ABG 06/13/09 7.44/50.6/55 on room air 9.  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  RHC (1/11) showed mean RA pressure 15, PA 57/42 mean 41 on room air and PA 43/24 mean 32 on nonrebreather mask, PCWP 15, SVO2 51%, CI 1.9.   ANA was negative and V/Q scan did not show evidence for chronic PE.    Past Surgical History: Reviewed history from 06/10/2009 and no changes required. Lumbar disk surgery 1985 Appendectomy 1960s Hemorrhoidectomy Tonsillectomy and adenoidectomy colonoscopy 09-23-03 per Dr. Corinda Gubler, repeat 5 yrs stents 2002  Family History: Reviewed history from 06/14/2009 and no changes required. Family History High cholesterol Family History Hypertension emphysema: maternal grandfather heart disease: father, mother, paternal grandfather clotting disorder: mother (stroke) rheumatism: mother cancer: mother ( female organs) brother (lung)   Social History: Reviewed history from 07/25/2009 and no changes required. 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 pt started smoking at age 45s.  1 1/2 ppd for years but quit 1/11 using Chantix.  Smoking Status:  quit < 6 months Pack years:  35 years x2 ppd  Review of Systems       The patient complains of shortness of breath with activity, shortness of breath at rest, non-productive cough, tooth/dental problems, hand/feet swelling, and joint stiffness or pain.  The patient denies productive cough, coughing up blood, chest pain, irregular heartbeats, acid heartburn, indigestion, loss of appetite, weight change, abdominal pain, difficulty swallowing, sore throat, headaches, nasal congestion/difficulty breathing through nose, sneezing, itching, ear  ache, anxiety, depression, rash, change in color of mucus, and fever.    Vital Signs:  Patient profile:   65 year old female Height:      66 inches (167.64 cm) Weight:      219 pounds (99.55 kg) BMI:     35.48 O2 Sat:      92 % on Room air Temp:     97.9 degrees F (36.61 degrees C) oral Pulse rate:   95 / minute BP sitting:   118 / 76  (left arm) Cuff size:   large  Vitals Entered By: Michel Bickers CMA (August 17, 2009 10:12 AM)  O2 Sat at Rest %:  89 O2 Flow:  Room air  O2 Sat Comments Patient came in on 4 liters oxygen. Oxygen removed to get a room air sat.Michel Bickers CMA  August 17, 2009 10:14 AM  Serial Vital Signs/Assessments:  Comments: 10:26 AM Ambulatory Pulse Oximetry  Resting; HR__96___  02 Sat__92___  Lap1 (185 feet)   HR__102___   02 Sat___92__ Lap2 (185 feet)   HR__106___   02 Sat__91___    Lap3 (185 feet)   HR__103__   02 Sat_92____  _x__Test Completed without Difficulty   The patient did c/o feeling short of breath and leg fatigue. ___Test Stopped due to:  By: Michel Bickers CMA   CC: Former Dr. Shelle Iron patient switching to Dr. Craige Cotta for sleep apnea and COPD.  The patient c/o increased sob with any exertion and says she is not wearing CPAP.   Physical Exam  General:  obese.   Nose:  no deformity, discharge, inflammation, or lesions Mouth:  MP 3, no oral lesions Neck:  no JVD.   Chest Wall:  no deformities noted Lungs:  diminished breath sounds, no wheezing or rales Heart:  regular rhythm, normal rate, and no murmurs.   Abdomen:  obese, soft, nontender Pulses:  pulses normal Extremities:  no clubbing, cyanosis, edema, or deformity noted Neurologic:  decreased sensation in lower extremities Cervical Nodes:  no significant adenopathy Psych:  alert and cooperative; normal mood and affect; normal attention span and concentration   Impression & Recommendations:  Problem # 1:  COPD (ICD-496) She has improved with smoking cessation, and inhaler therapy.   She is concerned about the expense of spiriva.  I have advised her to try using this every other day.  She is to continue symbicort, and as needed ventolin.  Problem # 2:  OBSTRUCTIVE SLEEP APNEA (ICD-327.23) She had difficulty with outpt set up CPAP.  Will arrange for in-lab titration study.  Will use BPAP as she has chronic hypoxic, and hypercapneic respiratory failure.  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  Problem # 4:  PULMONARY HYPERTENSION (ICD-416.8) This is secondary to her COPD, OSA, OHS, and diastolic dysfunction.    Problem # 5:  HYPOXEMIA (ICD-799.02) Her oxygen level was okay on room air at rest today.  Will continue supplemental oxygen at 2 liters with exertion, and 4 liters with sleep.  Will adjust this after review of her sleep test.  Problem # 6:  LEG PAIN (ICD-729.5) She has leg pain with exertion.  She may have peripheral vascular disease as the cause of this. I have advised her to follow up with primary care and cardiology for further assessment of this.  Medications Added to Medication List This Visit: 1)  Spiriva Handihaler 18 Mcg Caps (Tiotropium bromide monohydrate) .... One puff every other day 2)  Spiriva Handihaler 18 Mcg Caps (Tiotropium bromide monohydrate) .Marland Kitchen.. 1 capsule in handihaler daily 3)  Hydrocodone-acetaminophen 5-500 Mg Tabs (Hydrocodone-acetaminophen) .Marland Kitchen.. 1 by mouth every 6 hours as needed  Complete Medication List: 1)  Spiriva Handihaler 18 Mcg Caps (Tiotropium bromide monohydrate) .... One puff every other day 2)  Symbicort 160-4.5 Mcg/act Aero (Budesonide-formoterol fumarate) .... Two puffs twice daily 3)  Ventolin Hfa 108 (90 Base) Mcg/act Aers (Albuterol sulfate) .... 2 puffs every 4-6 hours as needed 4)  Prevacid 15 Mg Cpdr (Lansoprazole) .Marland Kitchen.. 1-2 caps once daily 5)  Simvastatin 80 Mg Tabs (Simvastatin) .Marland Kitchen.. 1 by mouth once daily 6)  Furosemide 40 Mg  Tabs (Furosemide) .... Take 2 tabs by mouth two times a day 7)  Bayer Low Strength 81 Mg Tbec (Aspirin) .... Once daily 8)  Metformin Hcl 1000 Mg Tabs (Metformin hcl) .Marland Kitchen.. 1 tab  by mouth two times a day 9)  Accu-chek Aviva Soln (Blood glucose calibration) .... Check once daily 10)  Xanax 0.5 Mg Tabs (Alprazolam) .... One tablet by mouth three times a day 11)  Neurontin 300 Mg Caps (Gabapentin) .... 3  times a day 12)  Glipizide 10 Mg Tabs (Glipizide) .Marland Kitchen.. 1 by mouth two times a day 13)  Spironolactone 50 Mg Tabs (Spironolactone) .... Take one tablet two times a day 14)  Zyrtec Allergy 10 Mg Caps (Cetirizine hcl) .... As needed 15)  Aleve 220 Mg Tabs (Naproxen sodium) .... As needed 16)  Chantix Continuing Month Pak 1 Mg Tabs (Varenicline tartrate) .... Use as directed 17)  Lancets Misc (Lancets) .... Once daily 18)  Oxygen  .... 2l for 24 hours 19)  Hydrocodone-acetaminophen 5-500 Mg Tabs (Hydrocodone-acetaminophen) .Marland Kitchen.. 1 by mouth every 6 hours as needed  Other Orders: Est. Patient Level IV (16109) Sleep Disorder Referral (Sleep Disorder)  Patient Instructions: 1)  Spiriva one puff every other day  2)  Will arrange for sleep test 3)  Continue symbicort two puffs two times a day 4)  Ventolin two puffs up to four times per day as needed for cough, wheeze, congestion, or shortness of breath 5)  Continue oxygen at 2 liters with exertion, and 4 liters with sleep until after review of sleep test 6)  Will call to schedule follow up after sleep test reviewed

## 2010-06-27 NOTE — Assessment & Plan Note (Signed)
Summary: COPD and eval. for sleep study/jd   CC:  Pulmonary Consult.  History of Present Illness: The pt is a very complicated 65y/o female who I have been asked to see for pulmonary htn and obstructive lung disease.  She has had a recent echo which showed mild diastolic dysfunction, mildly dilated RV, and elevated pulmonary artery pressures.  She underwent right heart cath the beginning of the year which showed PA pressure of 43/24, PAOP of 15, CO 3.5, and a calculated PVR at 4 wood units.  She has had a v/q scan with nothing to suggest chronic thromboembolic disease, but has been found to have chronic hypoxemia which is now being treated with supplemental oxygen.  She has had spirometry which showed an abnormal FVC and FEV1, but her ratio is normal.  Lung volumes are not available to differentiate btw obstruction vs. restriction.  Current Medications (verified): 1)  Prevacid 15 Mg Cpdr (Lansoprazole) .Marland Kitchen.. 1-2 Caps Once Daily 2)  Simvastatin 80 Mg Tabs (Simvastatin) .Marland Kitchen.. 1 By Mouth Once Daily 3)  Furosemide 40 Mg  Tabs (Furosemide) .... Take 2 Tabs By Mouth Two Times A Day 4)  Bayer Low Strength 81 Mg  Tbec (Aspirin) .... Once Daily 5)  Metformin Hcl 1000 Mg  Tabs (Metformin Hcl) .Marland Kitchen.. 1 Tab  By Mouth Two Times A Day 6)  Accu-Chek Aviva  Soln (Blood Glucose Calibration) .... Check Once Daily 7)  Tessalon Perles 100 Mg Caps (Benzonatate) .Marland Kitchen.. 1 Q 4 Hours As Needed Cough 8)  Xanax 0.5 Mg Tabs (Alprazolam) .... One Tablet By Mouth Three Times A Day 9)  Neurontin 300 Mg Caps (Gabapentin) .... 3  Times A Day 10)  Glipizide 10 Mg Tabs (Glipizide) .Marland Kitchen.. 1 By Mouth Two Times A Day 11)  Vicodin 5-500 Mg Tabs (Hydrocodone-Acetaminophen) .Marland Kitchen.. 1 Q 6 Hours As Needed Pain 12)  Spironolactone 50 Mg Tabs (Spironolactone) .... Take 1 Tablet By Mouth Two Times A Day 13)  Zyrtec Allergy 10 Mg Caps (Cetirizine Hcl) .... As Needed 14)  Mucinex 600 Mg Xr12h-Tab (Guaifenesin) .... As Needed 15)  Aleve 220 Mg Tabs  (Naproxen Sodium) .... As Needed 16)  Spiriva Handihaler 18 Mcg Caps (Tiotropium Bromide Monohydrate) .... Use Daily As Directed 17)  Chantix Starting Month Pak 0.5 Mg X 11 & 1 Mg X 42 Tabs (Varenicline Tartrate) .... Use As Directed 18)  Chantix Continuing Month Pak 1 Mg Tabs (Varenicline Tartrate) .... Use As Directed 19)  Lancets  Misc (Lancets) .... Once Daily  Allergies: 1)  ! Codeine  Past History:  Past Medical History: Reviewed history from 05/25/2009 and no changes required. 1. GERD 2. Hyperlipidemia 3. Hypertension 4. CAD: MI 2002 with PCI to proximal and distal RCA.  Adenosine myoview (4/09) with EF 72%, no ischemia or infarction.  5. Diabetes mellitus, type II 6. CHF: Echo (4/09) showed EF 60-65% with mild diastolic dysfunction.  Echo (12/10) showed mild LVH, EF 60-65%, mild AS (mean gradient 13 mmHg), mildly dilated RV with mild to moderate RV systolic dysfunction, and PASP 59 mmHg (moderate pulmonary hypertension).  7.  Probable COPD with active tobacco abuse.   Past Surgical History: Lumbar disk surgery 1985 Appendectomy 1960s Hemorrhoidectomy Tonsillectomy and adenoidectomy colonoscopy 09-23-03 per Dr. 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   pt is divorced and now single. pt has no children. pt started smoking at age 17s.  1 1/2 ppd.   Review of Systems       The patient complains of shortness of breath with activity, shortness of breath at rest, productive cough, non-productive cough, chest pain, weight change, tooth/dental problems, headaches, sneezing, itching, anxiety, hand/feet swelling, joint stiffness or pain, and change  in color of mucus.  The patient denies coughing up blood, irregular heartbeats, acid heartburn, indigestion, loss of appetite, abdominal pain, difficulty swallowing, sore throat, nasal congestion/difficulty breathing through nose, ear ache, depression, rash, and fever.    Vital Signs:  Patient profile:   65 year old female Height:      66 inches Weight:      220.50 pounds BMI:     35.72 O2 Sat:      91 % on Room air Temp:     98.0 degrees F oral Pulse rate:   90 / minute BP sitting:   108 / 60  (left arm) Cuff size:   regular  Vitals Entered By: Arman Filter LPN (June 10, 2009 9:22 AM)  O2 Flow:  Room air CC: Pulmonary Consult Comments Medications reviewed with patient  Arman Filter LPN  June 10, 2009 9:22 AM    Physical Exam  General:  obese female in nad Eyes:  PERRLA and EOMI.   Nose:  mild narrowing bilat Mouth:  moderate elongation of soft palate and uvula Neck:  no jvd, tmg, LN Lungs:  mod decrease in bs bilat, no wheezing, mild rhonchi Heart:  rrr, 2/6 sem Abdomen:  soft and nontender, bs+ Extremities:  2+ pedal edema, no cyanosis pulses intact distally Neurologic:  alert, but sleepy, answers all questions appropriately. moves all 4.   Impression & Recommendations:  Problem # 1:  PULMONARY HYPERTENSION (ICD-416.8) the pt has secondary pulmonary htn that is multifactorial.  She clearly has chronic hypoxemia, underlying obstructive lung disease, probable osa/ohs.  She does not have chronic thromboembolic disease by v/q, and unlikely to have autoimmune disease.  At this point, the treatment should consist of correcting her hypoxemia with supplemental oxygen, checking a sleep study ( I suspect she has both osa and ohs), and aggressive treatment of her obstructive lung disease.  She will also need to continue her diuresis per cardiology.  Once we have all the treatments in place, would recheck echo in 6mos to see if we have made any headway.  I have also  encouraged her to work aggressively on weight loss.  Problem # 2:  COPD (ICD-496)  The pt has a decreased FEV1 and FVC with a normal FEV1/FVC.  It is unclear how much of this is due to restriction, and how much may be due to airtrapping (i.e.  obstruction).  She will need lung volumes to sort this out.  She obviously has some degree of emphysema though.  It will be essential for her to stop smoking, and I have told her she will never improve until she does.  I would like to treat her with an aggressive bronchodilator regimen, but the pt states that she cannot afford them.  I have given her samples, and have referred to pt assistance program for assistance.  I have also told her the money she will save from smoking cessation can also be used for her meds!!  She has a very elevated serum bicarbonate, and I suspect she has significant CO2 retention.  This could be from copd or ohs.  Will check abg.  Her updated medication list for this problem includes:    Spiriva Handihaler 18 Mcg Caps (Tiotropium bromide monohydrate) ..... Use daily as directed    Symbicort 160-4.5 Mcg/act Aero (Budesonide-formoterol fumarate) .Marland Kitchen..Marland Kitchen Two puffs twice daily    Ventolin Hfa 108 (90 Base) Mcg/act Aers (Albuterol sulfate) .Marland Kitchen... 2 puffs every 4-6 hours as needed    Spiriva Handihaler 18 Mcg Caps (Tiotropium bromide monohydrate) ..... One puff in handihaler daily  Orders: Consultation Level V (04540) Pulmonary Referral (Pulmonary) Misc. Referral (Misc. Ref) Sleep Disorder Referral (Sleep Disorder)  Medications Added to Medication List This Visit: 1)  Furosemide 40 Mg Tabs (Furosemide) .... Take 2 tabs by mouth two times a day 2)  Spironolactone 50 Mg Tabs (Spironolactone) .... Take 1 tablet by mouth two times a day 3)  Symbicort 160-4.5 Mcg/act Aero (Budesonide-formoterol fumarate) .... Two puffs twice daily 4)  Ventolin Hfa 108 (90 Base) Mcg/act Aers (Albuterol sulfate) .... 2 puffs every 4-6 hours as needed 5)   Spiriva Handihaler 18 Mcg Caps (Tiotropium bromide monohydrate) .... One puff in handihaler daily  Patient Instructions: 1)  will set up for sleep study 2)  will set up for arterial blood gas and full breathing studies. 3)  you must stop smoking!! 4)  spiriva one each am 5)  symbicort 160/4.5  2 inhalations am and pm...rinse mouth 6)  ventolin (albuterol)  2 inhalations every 6 hrs if needed for rescue/emergencies only. 7)  will see if you qualify for pt assistance program for inhalers 8)  work on weight loss 9)  stay on oxygen 24/7 for now 10)  continue your fluid medicine per Dr Shirlee Latch 11)  followup with me in 3 weeks.   Prescriptions: SPIRIVA HANDIHALER 18 MCG  CAPS (TIOTROPIUM BROMIDE MONOHYDRATE) one puff in handihaler daily  #30 x 6   Entered and Authorized by:   Barbaraann Share MD   Signed by:   Barbaraann Share MD on 06/10/2009   Method used:   Print then Give to Patient   RxID:   [id number] VENTOLIN HFA 108 (90 BASE) MCG/ACT  AERS (ALBUTEROL SULFATE) 2 puffs every 4-6 hours as needed  #1 x 6   Entered and Authorized by:   Barbaraann Share MD   Signed by:   Barbaraann Share MD on 06/10/2009   Method used:   Print then Give to Patient   RxID:   [id number] SYMBICORT 160-4.5 MCG/ACT  AERO (BUDESONIDE-FORMOTEROL FUMARATE) Two puffs twice daily  #1 x 6   Entered and Authorized by:   Barbaraann Share MD   Signed by:   Barbaraann Share MD on 06/10/2009   Method used:   Print then Give to Patient   RxID:   [id number]

## 2010-06-27 NOTE — Progress Notes (Signed)
Summary: CHANGE DRS  Phone Note Call from Patient Call back at [phone]   Caller: Patient Call For: clance Summary of Call: pt does want to see you would like to see Dr Micheal Likens Initial call taken by: Rickard Patience,  July 18, 2009 8:21 AM  Follow-up for Phone Call        Please advise.Michel Bickers [facility]  July 18, 2009 8:56 AM  Additional Follow-up for Phone Call Additional follow up Details #1::        ok with me if ok with Dr. Craige Cotta.  He should review records and decide if he wants to be her managing pulmonologist. Additional Follow-up by: Barbaraann Share MD,  July 18, 2009 9:15 AM    Additional Follow-up for Phone Call Additional follow up Details #2::    Dr. Craige Cotta is this swtich okay with you? Please advise.Carron Curie CMA  July 18, 2009 9:18 AM   Additional Follow-up for Phone Call Additional follow up Details #3:: Details for Additional Follow-up Action Taken: That is fine.  Please advise the patient that I will be on vacation the first several weeks in March, so may not be able to schedule evaluation until after that. Additional Follow-up by: Coralyn Helling MD,  July 18, 2009 3:21 PM   Appended Document: CHANGE DRS LMOMTCB  Appended Document: CHANGE DRS LMTCB  Appended Document: CHANGE DRS called and spoke with pt.  pt scheduled to see VS as a consult on 08-17-2009 at 10:00am

## 2010-06-27 NOTE — Letter (Signed)
Summary: Unable to Work  Unable to Work   Imported By: Maryln Gottron 07/14/2009 13:05:33  _____________________________________________________________________  External Attachment:    Type:   Image     Comment:   External Document

## 2010-06-27 NOTE — Progress Notes (Signed)
Summary: SOB    Phone Note Call from Patient Call back at Home Phone [phone] Call back at Work Phone [phone]   Caller: Patient Reason for Call: Talk to Nurse Summary of Call: last friday was walking into to the [employer], SOB and heaviness in her legs, request appt in the am w/ Dr Shirlee Latch Initial call taken by: Migdalia Dk,  August 08, 2009 4:25 PM  Follow-up for Phone Call        spoke with pt, last friday she had an episode of walking into [employer] and she got SOB and was very fatiqued. she was wearing her O2. she states she has had other episodes but the one on friday bothered her. she weighs daily and she is up about 3 1/2 lbs since last week. she has some swelling in her feet and ankles today but not alot. she is not SOB with ADL or walking inside the house to the bathroom. she has noticed she gets out of breath with shorter distances than usual. she is not smoking. medications confirmed with pt. she wants to see dr Shirlee Latch tomorrow or friday. schedule is full will foward to dr Shirlee Latch for his review Deliah Goody, RN  August 08, 2009 5:00 PM reviewed with Dr Shirlee Latch and discussed with patient--appt with Dr Shirlee Latch 08-12-09

## 2010-06-27 NOTE — Progress Notes (Signed)
Summary: cpap  Phone Note Call from Patient   Caller: [email or web address]   Call For: clance Summary of Call: pt has been set up on cpap of 10cm and she just can not tolerate it Initial call taken by: Oneita Jolly,  July 18, 2009 8:58 AM  Follow-up for Phone Call        please call the pt and find out what her tolerance issues are specifically. Follow-up by: Barbaraann Share MD,  July 18, 2009 9:16 AM  Additional Follow-up for Phone Call Additional follow up Details #1::        Pt states when she went to [facility] for fitting when the mask was placed onher her and cpap was turned on her face became very fllushed as well as her neck and chest, she felt like she couldn't breathe, she became nauseated as well. She was using a nasal mask, it was not full face mask. Please advise. Carron Curie CMA  July 18, 2009 9:27 AM     Additional Follow-up for Phone Call Additional follow up Details #2::    called pt at home to discuss, and lmom to call us back with number where we can reach her. Follow-up by: Barbaraann Share MD,  July 18, 2009 4:11 PM  Additional Follow-up for Phone Call Additional follow up Details #3:: Details for Additional Follow-up Action Taken: pt called back.  pt can be reached on her cell phone:  [phone].Marland KitchenMarland KitchenAundra Millet Reynolds LPN  July 19, 2009 10:41 AM   Called and spoke with pt.  she is describing what sounds like a panic attack or anxiety attack when tried on cpap.  Will try bipap as an alternative.  will send order to [facility]. Additional Follow-up by: Barbaraann Share MD,  July 20, 2009 5:41 PM

## 2010-06-27 NOTE — Assessment & Plan Note (Signed)
Summary: follow up/cb   Copy to:  Marca Ancona Primary Provider/Referring Provider:  Nelwyn Salisbury MD  CC:  The patient says Jillian Bright could not do sleep study and is not on cpap. Jillian Bright says Jillian Bright feels like Jillian Bright is suffocationg.  Patient says her breathing is only good when Jillian Bright wears the oxygen.Jillian Bright  History of Present Illness: 65 yo female with mild COPD, Tobacco abuse, OSA/OHS, hypoxemia, and secondary pulmonary hypertension.  Jillian Bright has tried using BPAP, but has not been able to tolerate this.  Jillian Bright has tried multiple different masks.  Jillian Bright can not get used to have air blowing in her face while trying to sleep.  Jillian Bright continues to use oxygen.  Jillian Bright gets winded easily.  Jillian Bright has occasional cough with clear sputum, mostly in the morning.  Jillian Bright has been getting some leg swelling.  Jillian Bright recenlty had her dose of lasix decreased.  Jillian Bright has not been using ventolin much.   Current Medications (verified): 1)  Spiriva Handihaler 18 Mcg Caps (Tiotropium Bromide Monohydrate) .... One Puff Every Other Day 2)  Symbicort 160-4.5 Mcg/act  Aero (Budesonide-Formoterol Fumarate) .... Two Puffs Twice Daily 3)  Ventolin Hfa 108 (90 Base) Mcg/act  Aers (Albuterol Sulfate) .... 2 Puffs Every 4-6 Hours As Needed 4)  Prevacid 15 Mg Cpdr (Lansoprazole) .Jillian Bright.. 1-2 Caps Once Daily 5)  Furosemide 40 Mg  Tabs (Furosemide) .... Take One  By Mouth Two Times A Day 6)  Bayer Low Strength 81 Mg  Tbec (Aspirin) .... Once Daily 7)  Metformin Hcl 1000 Mg  Tabs (Metformin Hcl) .Jillian Bright.. 1 Tab  By Mouth Two Times A Day 8)  Accu-Chek Aviva  Soln (Blood Glucose Calibration) .... Check Once Daily 9)  Xanax 0.5 Mg Tabs (Alprazolam) .... One Tablet By Mouth Three Times A Day 10)  Neurontin 300 Mg Caps (Gabapentin) .... 3  Times A Day 11)  Glipizide 10 Mg Tabs (Glipizide) .Jillian Bright.. 1 By Mouth Two Times A Day 12)  Spironolactone 25 Mg Tabs (Spironolactone) .... One Twice A Day 13)  Zyrtec Allergy 10 Mg Caps (Cetirizine Hcl) .... As Needed 14)  Aleve 220 Mg Tabs  (Naproxen Sodium) .... As Needed 15)  Lancets  Misc (Lancets) .... Once Daily 16)  Oxygen .... 2l For 24 Hours 17)  Hydrocodone-Acetaminophen 5-500 Mg Tabs (Hydrocodone-Acetaminophen) .Jillian Bright.. 1 By Mouth Every 6 Hours As Needed 18)  Lipitor 80 Mg Tabs (Atorvastatin Calcium) .... One Tablet Daily  Allergies (verified): 1)  ! Codeine  Past History:  Past Medical History: 1. GERD 2. Hyperlipidemia 3. Hypertension 4. CAD: MI 2002 with PCI to proximal and distal RCA.  Adenosine myoview (4/09) with EF 72%, no ischemia or infarction. 5. Diabetes mellitus, type II 6. CHF: Echo (4/09) showed EF 60-65% with mild diastolic dysfunction.  Echo (12/10) showed mild LVH, EF 60-65%, mild AS (mean gradient 13 mmHg), mildly dilated RV with mild to moderate RV systolic dysfunction, and PASP 59 mmHg (moderate pulmonary hypertension).  7.  COPD: Has quit smoking.          - PFT 06/13/09 FEV1 1.97(76%), FVC 2.70(79%), FEV1% 73, TLC 5.35(101%), DLCO 65% 8.  OSA/obesity-hypoventilation        - PSG 06/19/09 AHI 13        - ABG 06/13/09 7.44/50.6/55 on room air        - intolerant of CPAP/BPAP 9.  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  RHC (1/11) showed mean RA pressure 15, PA 57/42 mean 41 on  room air and PA 43/24 mean 32 on nonrebreather mask, PCWP 15, SVO2 51%, CI 1.9.   ANA was negative and V/Q scan did not show evidence for chronic PE.    Past Surgical History: Reviewed history from 06/10/2009 and no changes required. Lumbar disk surgery 1985 Appendectomy 1960s Hemorrhoidectomy Tonsillectomy and adenoidectomy colonoscopy 09-23-03 per Dr. Corinda Gubler, repeat 5 yrs stents 2002  Social History: Lives alone, recently quit working, was a Interior and spatial designer Jillian Bright is divorced and now single. Jillian Bright has no children. Jillian Bright started smoking at age 42s.  1 1/2 ppd for years but quit 1/11 after using Chantix.   Vital Signs:  Patient profile:   65 year old female Height:       66 inches (167.64 cm) Weight:      213 pounds (96.82 kg) BMI:     34.50 O2 Sat:      90 % on 3 L/min pulsed Temp:     97.7 degrees F (36.50 degrees C) oral Pulse rate:   96 / minute BP sitting:   120 / 78  (right arm) Cuff size:   large  Vitals Entered By: Michel Bickers CMA (January 16, 2010 3:29 PM)  O2 Sat at Rest %:  90 O2 Flow:  3 L/min pulsed  Serial Vital Signs/Assessments:  Comments: Ambulatory Pulse Oximetry  Resting; HR__95___    02 Sat___94__ on 3 liters pulsed  Lap1 (185 feet)   HR_106____   02 Sat__90___ Lap2 (185 feet)   HR__108___   02 Sat__90___    Lap3 (185 feet)   HR_____   02 Sat_____  ___Test Completed without Difficulty __x_Test Stopped due to: Jillian Bright request   By: Carron Curie CMA   CC: The patient says Jillian Bright could not do sleep study and is not on cpap. Jillian Bright says Jillian Bright feels like Jillian Bright is suffocationg.  Patient says her breathing is only good when Jillian Bright wears the oxygen. Comments Medications reviewed with the patient. Daytime phone verified. Michel Bickers [facility]  January 16, 2010 3:39 PM   Physical Exam  General:  obese, on supplemental oxygen.   Nose:  no deformity, discharge, inflammation, or lesions Mouth:  MP 3, no oral lesions Neck:  no JVD.   Lungs:  diminished breath sounds, no wheezing or rales Heart:  regular rhythm, normal rate, and no murmurs.   Extremities:  minimal ankle edema Cervical Nodes:  no significant adenopathy   Impression & Recommendations:  Problem # 1:  COPD (ICD-496) Jillian Bright has mild COPD based on recent PFT.  Jillian Bright is not sure if the inhalers are helping that much.  Will try her on spiriva every other day, and symbicort one puff two times a day.  Advised her to call if her breathing gets worse with change to inhaler regimen.  Problem # 2:  OBSTRUCTIVE SLEEP APNEA (ICD-327.23) Jillian Bright has not been able to tolerate CPAP/BPAP.  Explained to her the rationale for using PAP therapy in relation to her cardiovascular disease and pulmonary  hypertension.  Offered multiple options to get her used to sleeping with BPAP, and explained to her that Jillian Bright was able to sleep with BPAP therapy during her recent in-lab titration study.  Jillian Bright is reluctant to try PAP therapy again at this time.  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  Explained that rationale for trying to continue with BPAP therapy.  Problem # 4:  HYPOXEMIA (ICD-799.02) Jillian Bright is to continue on supplemental oxygen at 3  liters.  Since Jillian Bright is not using BPAP at night, will arrange for ONO on 3 liters to see if her oxygen requirements need adjustment.  Problem # 5:  PULMONARY HYPERTENSION (ICD-416.8) This is secondary to her COPD, OSA, OHS, and diastolic dysfunction.  Continue to optimize therapies for these.  Explained to her that we may be somewhat limited in our ability to improve this without being able to treat her sleep apnea more aggressively.  Problem # 6:  DEPENDENT EDEMA (ICD-782.3) Jillian Bright has noticed some increase in leg swelling.  I have advised her to monitor her weight.  If this persists, I have advised her to follow up with primary care and cardiology to assess whether her diuretic regimen needs to be adjusted.  Medications Added to Medication List This Visit: 1)  Symbicort 160-4.5 Mcg/act Aero (Budesonide-formoterol fumarate) .... One puff bid 2)  Oxygen  .Jillian Bright.. 3 liters 24/7  Complete Medication List: 1)  Spiriva Handihaler 18 Mcg Caps (Tiotropium bromide monohydrate) .... One puff every other day 2)  Symbicort 160-4.5 Mcg/act Aero (Budesonide-formoterol fumarate) .... One puff bid 3)  Ventolin Hfa 108 (90 Base) Mcg/act Aers (Albuterol sulfate) .... 2 puffs every 4-6 hours as needed 4)  Oxygen  .Jillian Bright.. 3 liters 24/7 5)  Prevacid 15 Mg Cpdr (Lansoprazole) .Jillian Bright.. 1-2 caps once daily 6)  Furosemide 40 Mg Tabs (Furosemide) .... Take one  by mouth two times a day 7)  Spironolactone 25 Mg Tabs  (Spironolactone) .... One twice a day 8)  Bayer Low Strength 81 Mg Tbec (Aspirin) .... Once daily 9)  Lipitor 80 Mg Tabs (Atorvastatin calcium) .... One tablet daily 10)  Metformin Hcl 1000 Mg Tabs (Metformin hcl) .Jillian Bright.. 1 tab  by mouth two times a day 11)  Glipizide 10 Mg Tabs (Glipizide) .Jillian Bright.. 1 by mouth two times a day 12)  Accu-chek Aviva Soln (Blood glucose calibration) .... Check once daily 13)  Lancets Misc (Lancets) .... Once daily 14)  Xanax 0.5 Mg Tabs (Alprazolam) .... One tablet by mouth three times a day 15)  Neurontin 300 Mg Caps (Gabapentin) .... 3  times a day 16)  Hydrocodone-acetaminophen 5-500 Mg Tabs (Hydrocodone-acetaminophen) .Jillian Bright.. 1 by mouth every 6 hours as needed 17)  Aleve 220 Mg Tabs (Naproxen sodium) .... As needed 18)  Zyrtec Allergy 10 Mg Caps (Cetirizine hcl) .... As needed  Other Orders: Est. Patient Level III ([address]) DME Referral (DME)  Patient Instructions: 1)  Spiriva one puff every other day 2)  Symbicort one puff two times a day 3)  Ventolin two puffs up to four times per day as needed 4)  Will set up oxygen test at night 5)  Follow up in 2 to 3 months

## 2010-06-27 NOTE — Progress Notes (Signed)
Summary: results  Phone Note Call from Patient   Caller: Patient Call For: Leeandre Nordling Summary of Call: calling for sleep study results Initial call taken by: Rickard Patience,  Sep 26, 2009 3:28 PM  Follow-up for Phone Call        please advise. Carron Curie CMA  Sep 26, 2009 3:31 PM

## 2010-06-27 NOTE — Miscellaneous (Signed)
Summary: pfts/abg results.   Clinical Lists Changes  pfts show no obstruction by FEV1%, but there is mild airtrapping on lung volumes.  There is a 15% improvement in FVC with bronchodilator.  This is c/w mild obstruction at best, and most likely will resolve with smoking cessation.  Her Abg shows hypoxemia and hypercarbia, and given her pfts, are most c/w obesity hypoventilation syndrome.  She has a pending sleep study.

## 2010-06-27 NOTE — Letter (Signed)
Summary: Generic Letter--continuous oxygen  [employer], Main Office  [address]   [place], [place] [address]   Phone: [phone]  Fax: [phone]    06/14/2009  Jillian Bright [address] [place], [place]  [address]   To Whom It May Concern:  Since December 29,2010 it has been medically necessary for [name] to have continuous oxygen, both with rest and with exertion.           Sincerely,  Luana Shu for Dr Marca Ancona

## 2010-06-27 NOTE — Assessment & Plan Note (Signed)
Summary: rov for review of pfts and npsg   Copy to:  Marca Ancona Primary Provider/Referring Provider:  Nelwyn Salisbury MD  CC:  Pt is here for a f/u appt to discuss PFT and Sleep Study results.  Pt c/o increased sob with exertion.  Pt states she has quit smoking 4 weeks ago!  Pt states she is out of Spiriva samples.  Cannot afford prescription.  History of Present Illness: The pt comes in today for f/u of her recent sleep study and full pfts.  She was found on pfts to have a normal FEV1%, but did have increased RV/TLC c/w airtrapping.  There was a mild improvement with bronchodilators.  This is c/w mild obstructive disease with FEV1 69% of predicted.  Her DLCO was only mildly reduced.  She was also found to have on her abg a pCO2 of 51 with normal pH.  Her sleep study showed mild osa with AHI of 13/hr and desat to 75% even on 2 LPM of oxygen primarily during her REM events.  She has continued on her symbicort, but is unable to afford spiriva until her drug coverage plan kicks in.  She has not smoked in 4 weeks!  Current Medications (verified): 1)  Prevacid 15 Mg Cpdr (Lansoprazole) .Marland Kitchen.. 1-2 Caps Once Daily 2)  Simvastatin 80 Mg Tabs (Simvastatin) .Marland Kitchen.. 1 By Mouth Once Daily 3)  Furosemide 40 Mg  Tabs (Furosemide) .... Take 2 Tabs By Mouth Two Times A Day 4)  Bayer Low Strength 81 Mg  Tbec (Aspirin) .... Once Daily 5)  Metformin Hcl 1000 Mg  Tabs (Metformin Hcl) .Marland Kitchen.. 1 Tab  By Mouth Two Times A Day 6)  Accu-Chek Aviva  Soln (Blood Glucose Calibration) .... Check Once Daily 7)  Tessalon Perles 100 Mg Caps (Benzonatate) .Marland Kitchen.. 1 Q 4 Hours As Needed Cough 8)  Xanax 0.5 Mg Tabs (Alprazolam) .... One Tablet By Mouth Three Times A Day 9)  Neurontin 300 Mg Caps (Gabapentin) .... 3  Times A Day 10)  Glipizide 10 Mg Tabs (Glipizide) .Marland Kitchen.. 1 By Mouth Two Times A Day 11)  Vicodin 5-500 Mg Tabs (Hydrocodone-Acetaminophen) .Marland Kitchen.. 1 Q 6 Hours As Needed Pain 12)  Spironolactone 50 Mg Tabs (Spironolactone) .... Take  1 Tablet By Mouth One  Time A Day 13)  Zyrtec Allergy 10 Mg Caps (Cetirizine Hcl) .... As Needed 14)  Mucinex 600 Mg Xr12h-Tab (Guaifenesin) .... As Needed 15)  Aleve 220 Mg Tabs (Naproxen Sodium) .... As Needed 16)  Chantix Continuing Month Pak 1 Mg Tabs (Varenicline Tartrate) .... Use As Directed 17)  Lancets  Misc (Lancets) .... Once Daily 18)  Symbicort 160-4.5 Mcg/act  Aero (Budesonide-Formoterol Fumarate) .... Two Puffs Twice Daily 19)  Ventolin Hfa 108 (90 Base) Mcg/act  Aers (Albuterol Sulfate) .... 2 Puffs Every 4-6 Hours As Needed 20)  Oxygen .... 2l For 24 Hours  Allergies (verified): 1)  ! Codeine  Review of Systems      See HPI  Vital Signs:  Patient profile:   65 year old female Height:      66 inches Weight:      216.38 pounds O2 Sat:      91 % on Room air Temp:     97.8 degrees F oral Pulse rate:   109 / minute BP sitting:   110 / 60  (left arm) Cuff size:   regular  Vitals Entered By: Arman Filter LPN (July 13, 2009 10:14 AM)  O2 Flow:  Room air CC:  Pt is here for a f/u appt to discuss PFT and Sleep Study results.  Pt c/o increased sob with exertion.  Pt states she has quit smoking 4 weeks ago!  Pt states she is out of Spiriva samples.  Cannot afford prescription Comments Medications reviewed with patient Arman Filter LPN  July 13, 2009 10:16 AM    Physical Exam  General:  obese female in nad   Impression & Recommendations:  Problem # 1:  OBESITY HYPOVENTILATION SYNDROME (ICD-278.03) The pt clearly has OHS/OSA that will benefit from noninvasive positive pressure ventilation.  Her OHS in more of a problem than her sleep apnea.  Her lung disease is not signficant enough to cause hypercarbia.  Will start her on cpap initially, but may have to convert over to bipap if she does not tolerate.  I have emphasized the importance of weight loss.  She will need ONO done once we get her to an optimal pressure to make sure that we are adequately oxygenating  her at night.  DME is to bleed in 2 lpm for now thru cpap machine.  Time spent with pt today reviewing all tests and treatments was .  Problem # 2:  COPD (ICD-496) the pt has mild disease by her pfts.  She has done the main treatment with smoking cessation.  I would like for her to continue on an aggressive bronchodilator regimen for now.  Will assist with samples until her drug covage begins next month.  Problem # 3:  PULMONARY HYPERTENSION (ICD-416.8) multifactorial, and probably related to chronic nocturnal hypoxemia more than anything else.  Agree with continued diuretic therapy, and hopefully will improve once we improve nocturnal sats with cpap/oxygen.    Other Orders: Est. Patient Level IV ([address]) DME Referral (DME)  Patient Instructions: 1)  will start on cpap for your sleep apnea and small lung volumes while sleeping.  Stay on oxygen at night.  Please call if having issues with tolerance. 2)  stay off cigarettes, no change in breathing meds. 3)  work on weight loss 4)  stay on fluid meds per cardiology 5)  followup with me in 4 weeks.

## 2010-06-27 NOTE — Progress Notes (Signed)
Summary: pt calling again for results-requesting call back today  Phone Note Call from Patient   Caller: Patient Call For: clance Summary of Call: pt calling to get results from lung test Initial call taken by: Rickard Patience,  June 15, 2009 4:41 PM  Follow-up for Phone Call        pt calling again formPFT and AGB results that were done on 06/13/09/ at [facility]-  requesting a call back today.  Will forward message to [facility] advise.  THanks!  Follow-up by: Gweneth Dimitri RN,  June 15, 2009 4:45 PM  Additional Follow-up for Phone Call Additional follow up Details #1::        let her know that she has very mild emphysema, but still needs to quit smoking because it makes her more sob.  Her blood gas does show that her carbon dioxide level is up, and I think this is due to her inability to take a deep breath because of her abdomen squishing her lungs.  I will get her back into office once sleep study is done to go over all of this. Additional Follow-up by: Barbaraann Share MD,  June 15, 2009 5:04 PM    Additional Follow-up for Phone Call Additional follow up Details #2::    called, spoke with pt.  Pt informed of results and recs as stated above per KC-she verbalized understanding. Follow-up by: Gweneth Dimitri RN,  June 15, 2009 5:11 PM

## 2010-06-27 NOTE — Progress Notes (Signed)
Summary: calling with question about lab   Phone Note Call from Patient Call back at Home Phone [phone] Call back at Work Phone [phone]   Caller: Patient Summary of Call: Pt calling regarding getting her labs done Friday instead of Monday. Initial call taken by: Judie Grieve,  June 09, 2009 8:30 AM  Follow-up for Phone Call        [facility] Katina Dung, RN, BSN  June 09, 2009 10:16 AM  [facility] Katina Dung, RN, BSN  June 09, 2009 1:50 PM  [facility] Katina Dung, RN, BSN  June 09, 2009 5:30 PM talked with pt at appt with Dr. Marca Ancona 06-14-09

## 2010-06-27 NOTE — Letter (Signed)
Summary: CMN for Supplies / [facility]  CMN for Supplies / [facility]   Imported By: Lennie Odor 12/12/2009 16:13:12  _____________________________________________________________________  External Attachment:    Type:   Image     Comment:   External Document

## 2010-06-27 NOTE — Progress Notes (Signed)
Summary: Calling to get paper work filled out for oxygen   Phone Note Call from Patient Call back at [facility] Phone [phone] Call back at Work Phone [phone]   Caller: Patient Summary of Call: Pt calling regarding  getting paper work filled out for oxygen  for pt to go out of town  Initial call taken by: Judie Grieve,  Oct 12, 2009 4:42 PM

## 2010-06-27 NOTE — Progress Notes (Signed)
Summary: refill alprazolam   Phone Note From Pharmacy   [facility]  [facility] [address] [phone]* Reason for Call: Needs renewal Summary of Call: refill apprazolam  Initial call taken by: Pura Spice, RN,  March 01, 2010 4:41 PM  Follow-up for Phone Call        call in #90 with 5 rf Follow-up by: Nelwyn Salisbury MD,  March 02, 2010 2:19 PM    Prescriptions: Jillian Bright 0.5 MG TABS (ALPRAZOLAM) one tablet by mouth three times a day  #90 x 5   Entered by:   Kathrynn Speed CMA   Authorized by:   Nelwyn Salisbury MD   Signed by:   Kathrynn Speed CMA on 03/02/2010   Method used:   Telephoned to ...       [facility] [address] [phone]* (retail)       [address]       [place], [place]  [address]       Ph: [phone] or [phone]       Fax: [phone]   RxID:   [id number]

## 2010-06-27 NOTE — Assessment & Plan Note (Signed)
Summary: rov  Medications Added SPIRONOLACTONE 50 MG TABS (SPIRONOLACTONE) take one tablet two times a day      Allergies Added:   Referring Provider:  Marca Ancona Primary Provider:  Nelwyn Salisbury MD   History of Present Illness: 65 yo with history of CAD s/p RCA PCI in 2002 and CHF with peripheral edema presented initially for evaluation of shortness of breath and edema.  Patient had significant peripheral edema in 2009.  Workup at that time revealed a normal stress test and preserved EF on echo. Patient was treated with Lasix and swelling improved.  She redeveloped significant lower extremity edema over the last 2-3 months.  She got to the point where she was short of breath with pulling her trash can to the street or walking the equivalent of about 1/2 a city block.  Echo was done showing RV failure and pulmonary hypertension.   We found her oxygen saturation to be low on room air at rest and with exertion.  Right heart catheterization showed moderate pulmonary hypertension that responded well to oxygen with decreased PA pressure.  She had PFTs that suggested COPD and was diagnosed with OHS/OSA by the pulmonary service.  She has quit smoking.  On oxygen, she has felt better but still has significant exertional dyspnea.  Last Friday, she noted herself becoming extremely short of breath walking down the hall at the [facility].  She is still short of breath after walking about 50 feet.  No cough, fever, congestion, or productive sputum.  No chest pain.  She is using her oxygen at almost all times.  She has had some trouble with the CPAP mask and will be seeing pulmonary again next week to deal with this.   We walked the patient today with 2 liters oxygen by nasal cannula on.  She dropped her oxygen saturation to as low as 83%.  After turning the oxygen up to 4 liters, saturation stayed in the 90s with ambulation.   Labs (12/10): creatinine 0.8, BNP 73, TSH normal, HCT 42.4, ANA  negative Labs (1/11): K 4.4, creatinine 0.8  Current Medications (verified): 1)  Prevacid 15 Mg Cpdr (Lansoprazole) .Marland Kitchen.. 1-2 Caps Once Daily 2)  Simvastatin 80 Mg Tabs (Simvastatin) .Marland Kitchen.. 1 By Mouth Once Daily 3)  Furosemide 40 Mg  Tabs (Furosemide) .... Take 2 Tabs By Mouth Two Times A Day 4)  Bayer Low Strength 81 Mg  Tbec (Aspirin) .... Once Daily 5)  Metformin Hcl 1000 Mg  Tabs (Metformin Hcl) .Marland Kitchen.. 1 Tab  By Mouth Two Times A Day 6)  Accu-Chek Aviva  Soln (Blood Glucose Calibration) .... Check Once Daily 7)  Xanax 0.5 Mg Tabs (Alprazolam) .... One Tablet By Mouth Three Times A Day 8)  Neurontin 300 Mg Caps (Gabapentin) .... 3  Times A Day 9)  Glipizide 10 Mg Tabs (Glipizide) .Marland Kitchen.. 1 By Mouth Two Times A Day 10)  Spironolactone 50 Mg Tabs (Spironolactone) .... Take One Tablet Two Times A Day 11)  Zyrtec Allergy 10 Mg Caps (Cetirizine Hcl) .... As Needed 12)  Aleve 220 Mg Tabs (Naproxen Sodium) .... As Needed 13)  Chantix Continuing Month Pak 1 Mg Tabs (Varenicline Tartrate) .... Use As Directed 14)  Lancets  Misc (Lancets) .... Once Daily 15)  Symbicort 160-4.5 Mcg/act  Aero (Budesonide-Formoterol Fumarate) .... Two Puffs Twice Daily 16)  Ventolin Hfa 108 (90 Base) Mcg/act  Aers (Albuterol Sulfate) .... 2 Puffs Every 4-6 Hours As Needed 17)  Oxygen .... 2l  For 24 Hours  Allergies (verified): 1)  ! Codeine  Past History:  Past Medical History: Reviewed history from 07/25/2009 and no changes required. 1. GERD 2. Hyperlipidemia 3. Hypertension 4. CAD: MI 2002 with PCI to proximal and distal RCA.  Adenosine myoview (4/09) with EF 72%, no ischemia or infarction. 5. Diabetes mellitus, type II 6. CHF: Echo (4/09) showed EF 60-65% with mild diastolic dysfunction.  Echo (12/10) showed mild LVH, EF 60-65%, mild AS (mean gradient 13 mmHg), mildly dilated RV with mild to moderate RV systolic dysfunction, and PASP 59 mmHg (moderate pulmonary hypertension).  7.  COPD: Has quit smoking.   8.   OSA/obesity-hypoventilation 9.  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  RHC (1/11) showed mean RA pressure 15, PA 57/42 mean 41 on room air and PA 43/24 mean 32 on nonrebreather mask, PCWP 15, SVO2 51%, CI 1.9.   ANA was negative and V/Q scan did not show evidence for chronic PE.    Family History: Reviewed history from 06/14/2009 and no changes required. Family History High cholesterol Family History Hypertension  emphysema: maternal grandfather heart disease: father, mother, paternal grandfather clotting disorder: mother (stroke) rheumatism: mother cancer: mother ( female organs) brother (lung)   Social History: Reviewed history from 07/25/2009 and no changes required. 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 pt started smoking at age 61s.  1 1/2 ppd for years but quit 1/11 using Chantix.   Review of Systems       All systems reviewed and negative except as per HPI.   Vital Signs:  Patient profile:   65 year old female Height:      66 inches Weight:      217 pounds BMI:     35.15 O2 Sat:      88 % on Room air Pulse rate:   97 / minute Pulse rhythm:   regular BP sitting:   97 / 65  (left arm) Cuff size:   large  Vitals Entered By: [name] CMA (August 12, 2009 11:01 AM)  O2 Flow:  Room air  Serial Vital Signs/Assessments:  Comments: 11:56 AM O2 sat with 2 liters at 88% start-after brisk walk O2 at 83 % O2 sat with 4 liters at 95% start-after brisk walk O2 at 88%  Pt reports being winded, and legs were feeling very heavy By: [name] CMA    Physical Exam  General:  Well developed, well nourished, in no acute distress. Obese. Neck:  Neck supple, no JVD. No masses, thyromegaly or abnormal cervical nodes. Lungs:  Clear bilaterally to auscultation and percussion. Heart:  Somewhat distant heart sounds.  Non-displaced PMI, chest  non-tender; regular rate and rhythm, S1, S2 without murmurs, rubs or gallops. Carotid upstroke normal, no bruit. Pedals normal pulses. 1+ edema 3/4 up lower legs bilaterally.  Abdomen:  Bowel sounds positive; abdomen soft and non-tender without masses, organomegaly, or hernias noted. No hepatosplenomegaly. Extremities:  No clubbing or cyanosis. Neurologic:  Alert and oriented x 3. Psych:  Normal affect.   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  She is wearing her O2 during the day but has had trouble with the CPAP mask.  She will see Dr. Craige Cotta next week to address this.  She is still desaturating significantly with exertion despite 2 liters oxygen.  I will have her increase oxygen to 4 liters by  nasal cannula (good oxygen saturation with exertion on this setting).   Problem # 2:  PULMONARY HYPERTENSION (ICD-416.8) Patient had moderate pulmonary hyperetnsion by echo and cath as well as a dilated RV.  I suspect that this is primarily due to hypoxic pulmonary vasoconstriction from COPD and OSA/OHS.  V/Q scan showed no evidence of chronic PE.  On right heart cath, her mean PA pressure decreased from 41 mmHg to 32 mmHg with oxygen.  She is now on oxygen at all times.  She will be starting CPAP. Right heart hemodynamics should improve over time on oxygen.  Will continue current diuretics for now.    Problem # 3:  CORONARY ARTERY DISEASE (ICD-414.00) No chest pain, normal LV systolic function on echo.  Continue ASA and statin.  Will need fasting lipids/LFTs prior to next appointment.    Patient Instructions: 1)  Your physician recommends that you return for a FASTING lipid profile/liver profile in 3 months.  June 13,2011 2)  Your physician recommends that you schedule a follow-up appointment in: 3  months with Dr Shirlee Latch.    JUNE 20,2011 at 9:45am 3)  Increase you oxygen to 4L/min.

## 2010-06-27 NOTE — Assessment & Plan Note (Signed)
Summary: per check out/echo @ 8:30/saf  Medications Added FUROSEMIDE 40 MG  TABS (FUROSEMIDE) take one daily SPIRONOLACTONE 25 MG TABS (SPIRONOLACTONE) one daily      Allergies Added:   Visit Type:  follow-up / echo Primary Provider:  Nelwyn Salisbury MD  CC:  swelling, SOB, and CP.  History of Present Illness: 65 yo with history of CAD s/p RCA PCI in 2002 and CHF with peripheral edema presented initially for evaluation of shortness of breath and edema.  Patient had significant peripheral edema in 2009.  Workup at that time revealed a normal stress test and preserved EF on echo. Patient was treated with Lasix and swelling improved.  She redeveloped significant lower extremity edema in early 2010 as well .  Echo was done showing RV failure and pulmonary hypertension.   We found her oxygen saturation to be low on room air at rest and with exertion.  Right heart catheterization showed moderate pulmonary hypertension that responded well to oxygen with decreased PA pressure.  She had PFTs that suggested COPD and was diagnosed with OHS/OSA by the pulmonary service.  She has quit smoking and is using CPAP.   Patient still has significant dyspnea.  She is using oxygen at all times.  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  She has stable dyspnea after walking 50-100 feet.  She gets very short of breath pushing her trashcans to the street.  She is looking into pulmonary rehab.  She does have occasional episodes of atypical chest pain.  This is lower substernal aching that lasts < 1 minute and is not related to exertion.  This pattern has been stable for over a year.  Weight is down 5 lbs since last appointment.   I reviewed her echo today.  The RV does appear improved and there is less tricuspid regurgitation (unable to estimate PA systolic pressure).   Labs (12/10): creatinine 0.8, BNP 73, TSH normal, HCT 42.4, ANA negative Labs (1/11): K 4.4, creatinine  0.8 Labs (6/11): K 3.7, creatinine 0.9, BNP 37, HDL 38, TGs 213, LDL 137 Labs (11/11): LDL 87, HDL 39, LFTs normal Labs 12/11): BNP 12  ECG: NSR, RBBB  Current Medications (verified): 1)  Ventolin Hfa 108 (90 Base) Mcg/act  Aers (Albuterol Sulfate) .... 2 Puffs Every 4-6 Hours As Needed 2)  Oxygen .Marland Kitchen.. 3 Liters 24/7 3)  Prevacid 15 Mg Cpdr (Lansoprazole) .Marland Kitchen.. 1-2 Caps Once Daily 4)  Furosemide 40 Mg  Tabs (Furosemide) .... Take One  By Mouth Two Times A Day 5)  Spironolactone 25 Mg Tabs (Spironolactone) .... One Twice A Day 6)  Bayer Low Strength 81 Mg  Tbec (Aspirin) .... Once Daily 7)  Crestor 40 Mg Tabs (Rosuvastatin Calcium) .... One Daily 8)  Metformin Hcl 1000 Mg  Tabs (Metformin Hcl) .Marland Kitchen.. 1 Tab  By Mouth Two Times A Day 9)  Glipizide 10 Mg Tabs (Glipizide) .Marland Kitchen.. 1 By Mouth Two Times A Day 10)  Accu-Chek Aviva  Soln (Blood Glucose Calibration) .... Check Once Daily 11)  Lancets  Misc (Lancets) .... Once Daily 12)  Xanax 0.5 Mg Tabs (Alprazolam) .... One Tablet By Mouth Three Times A Day 13)  Neurontin 300 Mg Caps (Gabapentin) .... 3  Times A Day 14)  Hydrocodone-Acetaminophen 5-500 Mg Tabs (Hydrocodone-Acetaminophen) .Marland Kitchen.. 1 By Mouth Every 6 Hours As Needed 15)  Aleve 220 Mg Tabs (Naproxen Sodium) .... As Needed 16)  Zyrtec Allergy 10 Mg Caps (Cetirizine  Hcl) .... As Needed  Allergies (verified): 1)  ! Codeine  Past History:  Past Medical History: 1. GERD 2. Hyperlipidemia 3. Hypertension 4. CAD: MI 2002 with PCI to proximal and distal RCA.  Adenosine myoview (4/09) with EF 72%, no ischemia or infarction. 5. Diabetes mellitus, type II 6. CHF: Echo (4/09) showed EF 60-65% with mild diastolic dysfunction.  Echo (12/10) showed mild LVH, EF 60-65%, mild AS (mean gradient 13 mmHg), mildly dilated RV with mild to moderate RV systolic dysfunction, and PASP 59 mmHg (moderate pulmonary hypertension).  Echo (11/11): EF 60-65%, mild aortic stenosis with mean gradient 12 mmHg, grade I  diastolic dysfunction, normal RV size and systolic function, unable to estimate PA systolic pressure.  7.  COPD: Has quit smoking.          - PFT 06/13/09 FEV1 1.97(76%), FEV1% 73, TLC 5.35(101%), DLCO 65% 8.  OSA/obesity-hypoventilation        - PSG 06/19/09 AHI 13        - ABG 06/13/09 7.44/50.6/55 on room air        - intolerant of CPAP/BPAP 9.  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  RHC (1/11) showed mean RA pressure 15, PA 57/42 mean 41 on room air and PA 43/24 mean 32 on nonrebreather mask, PCWP 15, SVO2 51%, CI 1.9.   ANA was negative and V/Q scan did not show evidence for chronic PE.  RV appeared improved on 11/11 echo.  10. Mild aortic stenosis.   Family History: Reviewed history from 08/17/2009 and no changes required. Family History High cholesterol Family History Hypertension emphysema: maternal grandfather heart disease: father, mother, paternal grandfather clotting disorder: mother (stroke) rheumatism: mother cancer: mother ( female organs) brother (lung)   Social History: Reviewed history from 01/16/2010 and no changes required. 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 pt started smoking at age 47s.  1 1/2 ppd for years but quit 1/11 after using Chantix.   Review of Systems       All systems reviewed and negative except as per HPI.   Vital Signs:  Patient profile:   65 year old female Height:      68 inches Weight:      204.50 pounds BMI:     31.21 Pulse rate:   88 / minute BP sitting:   102 / 64  (left arm) Cuff size:   regular  Vitals Entered By: Caralee Ates CMA (April 27, 2010 9:56 AM)  Physical Exam  General:  obese, on supplemental oxygen.   Neck:  Neck supple, no JVD. No masses, thyromegaly or abnormal cervical nodes. Lungs:  Distant breath sounds.   Heart:  Non-displaced PMI, chest non-tender; regular rate and rhythm, S1,  S2 without rubs or gallops. 2/6 SEM.  Carotid upstroke normal, no bruit. Pedals normal pulses. No edema, no varicosities. Abdomen:  Bowel sounds positive; abdomen soft and non-tender without masses, organomegaly, or hernias noted. No hepatosplenomegaly. Extremities:  No clubbing or cyanosis. Neurologic:  Alert and oriented x 3. Psych:  Normal affect.   Impression & Recommendations:  Problem # 1:  PULMONARY HYPERTENSION (ICD-416.8) This is secondary to her COPD, OSA, OHS, and diastolic dysfunction.  Her RV looked better on echo today and there was less tricuspid regurgitation.  No complete TR doppler signal so I was unable to estimate PA systolic pressure.  However, I suspect that her PA pressures are lower, likely due  to oxygen use and CPAP.  BNP today was normal.  She had no peripheral edema and neck veins were flat.  I think that she can decrease Lasix and sprionolactone to once daily.   Problem # 2:  HYPERLIPIDEMIA (ICD-272.4) Goal LDL < 70.  Problem # 3:  CORONARY ARTERY DISEASE (ICD-414.00) Atypical chest pain only.  Continue treatment with aspirin and statin, monitor symptoms.    Problem # 4:  AORTIC STENOSIS Mild by echo and exam.   Other Orders: TLB-BNP (B-Natriuretic Peptide) (83880-BNPR) TLB-BMP (Basic Metabolic Panel-BMET) (80048-METABOL)  Patient Instructions: 1)  Your physician has recommended you make the following change in your medication:  2)  Decrease Spironolactone to 25mg  daily. 3)  Decrease Lasix(furosemide) to 40mg  daily. 4)  Lab today--BMP/BNP 414.01 5)  Your physician wants you to follow-up in: 6 months with Dr Shirlee Latch.  You will receive a reminder letter in the mail two months in advance. If you don't receive a letter, please call our office to schedule the follow-up appointment.

## 2010-06-27 NOTE — Letter (Signed)
Summary: Architectural technologist - Walk-In Patient Form  Architectural technologist - Walk-In Patient Form   Imported By: Marylou Mccoy 11/29/2009 11:39:42  _____________________________________________________________________  External Attachment:    Type:   Image     Comment:   External Document

## 2010-06-27 NOTE — Assessment & Plan Note (Signed)
Summary: 3 month f/u ///kp   Visit Type:  Follow-up Copy to:  Marca Ancona Primary Rithvik Orcutt/Referring Vesta Wheeland:  Nelwyn Salisbury MD  CC:  COPD...OSA...the patient c/owaking up 3-4 times every night...slight increase in SOB with exertion...fatigues easily.  History of Present Illness: 65 yo female with mild COPD, Tobacco abuse, OSA/OHS, hypoxemia, and secondary pulmonary hypertension.  She continues to get winded easily with exertion.  She has occasional cough and wheeze.  She is not having much sputum.  She denies chest pain, hemoptysis, or fever.  She gets occasional leg swelling.    She continues to use oxygen day and night.  She wakes up several times per night.  She feels sleepy and has no energy during the day.  This has not changed much.  She is worried that she may have to apply for disability.   Current Medications (verified): 1)  Spiriva Handihaler 18 Mcg Caps (Tiotropium Bromide Monohydrate) .... One Puff Every Other Day 2)  Symbicort 160-4.5 Mcg/act  Aero (Budesonide-Formoterol Fumarate) .... One Puff Bid 3)  Ventolin Hfa 108 (90 Base) Mcg/act  Aers (Albuterol Sulfate) .... 2 Puffs Every 4-6 Hours As Needed 4)  Oxygen .Marland Kitchen.. 3 Liters 24/7 5)  Prevacid 15 Mg Cpdr (Lansoprazole) .Marland Kitchen.. 1-2 Caps Once Daily 6)  Furosemide 40 Mg  Tabs (Furosemide) .... Take One  By Mouth Two Times A Day 7)  Spironolactone 25 Mg Tabs (Spironolactone) .... One Twice A Day 8)  Bayer Low Strength 81 Mg  Tbec (Aspirin) .... Once Daily 9)  Crestor 40 Mg Tabs (Rosuvastatin Calcium) .... One Daily 10)  Metformin Hcl 1000 Mg  Tabs (Metformin Hcl) .Marland Kitchen.. 1 Tab  By Mouth Two Times A Day 11)  Glipizide 10 Mg Tabs (Glipizide) .Marland Kitchen.. 1 By Mouth Two Times A Day 12)  Accu-Chek Aviva  Soln (Blood Glucose Calibration) .... Check Once Daily 13)  Lancets  Misc (Lancets) .... Once Daily 14)  Xanax 0.5 Mg Tabs (Alprazolam) .... One Tablet By Mouth Three Times A Day 15)  Neurontin 300 Mg Caps (Gabapentin) .... 3  Times A  Day 16)  Hydrocodone-Acetaminophen 5-500 Mg Tabs (Hydrocodone-Acetaminophen) .Marland Kitchen.. 1 By Mouth Every 6 Hours As Needed 17)  Aleve 220 Mg Tabs (Naproxen Sodium) .... As Needed 18)  Zyrtec Allergy 10 Mg Caps (Cetirizine Hcl) .... As Needed  Allergies (verified): 1)  ! Codeine  Past History:  Past Medical History: 1. GERD 2. Hyperlipidemia 3. Hypertension 4. CAD: MI 2002 with PCI to proximal and distal RCA.  Adenosine myoview (4/09) with EF 72%, no ischemia or infarction. 5. Diabetes mellitus, type II 6. CHF: Echo (4/09) showed EF 60-65% with mild diastolic dysfunction.  Echo (12/10) showed mild LVH, EF 60-65%, mild AS (mean gradient 13 mmHg), mildly dilated RV with mild to moderate RV systolic dysfunction, and PASP 59 mmHg (moderate pulmonary hypertension).  7.  COPD: Has quit smoking.          - PFT 06/13/09 FEV1 1.97(76%), FEV1% 73, TLC 5.35(101%), DLCO 65% 8.  OSA/obesity-hypoventilation        - PSG 06/19/09 AHI 13        - ABG 06/13/09 7.44/50.6/55 on room air        - intolerant of CPAP/BPAP 9.  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  RHC (1/11) showed mean RA pressure 15, PA 57/42 mean 41 on room air and PA 43/24 mean 32 on nonrebreather mask, PCWP 15, SVO2 51%, CI 1.9.   ANA was negative  and V/Q scan did not show evidence for chronic PE.    Past Surgical History: Reviewed history from 06/10/2009 and no changes required. Lumbar disk surgery 1985 Appendectomy 1960s Hemorrhoidectomy Tonsillectomy and adenoidectomy colonoscopy 09-23-03 per Dr. Corinda Gubler, repeat 5 yrs stents 2002  Vital Signs:  Patient profile:   65 year old female Height:      68 inches (172.72 cm) Weight:      207 pounds (94.09 kg) BMI:     31.59 O2 Sat:      95 % on 3 L/min Temp:     97.9 degrees F (36.61 degrees C) oral Pulse rate:   93 / minute BP sitting:   126 / 82  (left arm) Cuff size:   large  Vitals Entered By: Michel Bickers CMA (April 10, 2010 1:42 PM)  O2 Sat at Rest %:  95 O2 Flow:  3 L/min CC: COPD...OSA...the patient c/owaking up 3-4 times every night...slight increase in SOB with exertion...fatigues easily Comments Medications reviewed with patient Michel Bickers [facility]  April 10, 2010 1:43 PM   Physical Exam  General:  obese, on supplemental oxygen.   Nose:  no deformity, discharge, inflammation, or lesions Mouth:  MP 3, no oral lesions, wears dentures Neck:  no JVD.   Lungs:  diminished breath sounds, no wheezing or rales Heart:  regular rhythm, normal rate, and no murmurs.   Extremities:  minimal ankle edema Neurologic:  normal CN II-XII and strength normal.   Cervical Nodes:  no significant adenopathy   Impression & Recommendations:  Problem # 1:  COPD (ICD-496) This is relatively stable.  Will stop spiriva.  She is to continue symbicort and as needed ventolin.  She is to call if her breathing gets worse off of spiriva.  Will arrange for pulmonary rehab.  She will get her flu and pneumonia shot today.  Problem # 2:  HYPOXEMIA (ICD-799.02) Her oxygen saturation was okay with exertion on room air.  Will continue supplemental oxygen at night.  Problem # 3:  OBSTRUCTIVE SLEEP APNEA (ICD-327.23) She has been intolerant of CPAP.  She is not a candidate for oral appliance.  Surgery would not be a good option.  Encouraged her to try to loss weight.  Hopefully she will get benefit from pulmonary rehab.  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  Problem # 5:  PULMONARY HYPERTENSION (ICD-416.8)  This is secondary to her COPD, OSA, OHS, and diastolic dysfunction.  Continue to optimize therapies for these.  Explained to her that we may be somewhat limited in our ability to improve this without being able to treat her sleep apnea more aggressively.  She is to have Echo later this month with cardiology.  Complete Medication List: 1)  Symbicort 160-4.5 Mcg/act Aero  (Budesonide-formoterol fumarate) .... One puff bid 2)  Ventolin Hfa 108 (90 Base) Mcg/act Aers (Albuterol sulfate) .... 2 puffs every 4-6 hours as needed 3)  Oxygen  .Marland Kitchen.. 3 liters 24/7 4)  Prevacid 15 Mg Cpdr (Lansoprazole) .Marland Kitchen.. 1-2 caps once daily 5)  Furosemide 40 Mg Tabs (Furosemide) .... Take one  by mouth two times a day 6)  Spironolactone 25 Mg Tabs (Spironolactone) .... One twice a day 7)  Bayer Low Strength 81 Mg Tbec (Aspirin) .... Once daily 8)  Crestor 40 Mg Tabs (Rosuvastatin calcium) .... One daily 9)  Metformin Hcl 1000 Mg Tabs (Metformin hcl) .Marland Kitchen.. 1 tab  by mouth two times a day 10)  Glipizide 10 Mg Tabs (  Glipizide) .Marland Kitchen.. 1 by mouth two times a day 11)  Accu-chek Aviva Soln (Blood glucose calibration) .... Check once daily 12)  Lancets Misc (Lancets) .... Once daily 13)  Xanax 0.5 Mg Tabs (Alprazolam) .... One tablet by mouth three times a day 14)  Neurontin 300 Mg Caps (Gabapentin) .... 3  times a day 15)  Hydrocodone-acetaminophen 5-500 Mg Tabs (Hydrocodone-acetaminophen) .Marland Kitchen.. 1 by mouth every 6 hours as needed 16)  Aleve 220 Mg Tabs (Naproxen sodium) .... As needed 17)  Zyrtec Allergy 10 Mg Caps (Cetirizine hcl) .... As needed  Other Orders: Est. Patient Level III (16109) Rehabilitation Referral (Rehab)  Patient Instructions: 1)  Stop spiriva 2)  Symbicort one puff two times a day 3)  Ventolin two puffs as needed 4)  Flu and pneumonia shot today 5)  Will arrange for pulmonary rehab 6)  Use oxygen at night, and as needed during the day 7)  Follow up in 4 months    Appended Document: 3 month f/u ///kp Ambulatory Pulse Oximetry  Resting; HR__87___    02 Sat__95%RA___  Lap1 (185 feet)   HR__103___   02 Sat__93%RA___ Lap2 (185 feet)   HR__104___   02 Sat__92%RA___    Lap3 (185 feet)   HR__106___   02 Sat__93%RA___  _X__Test Completed without Difficulty ___Test Stopped due to:     Appended Document: 3 month f/u ///kp     Clinical Lists  Changes  Observations: Added new observation of FLU VAX: Historical (04/10/2010 8:44)       Immunization History:  Influenza Immunization History:    Influenza:  historical (04/10/2010)

## 2010-06-27 NOTE — Progress Notes (Signed)
Summary: pt rtn your call/lg   Phone Note Call from Patient Call back at Home Phone [phone] Call back at Work Phone [phone]   Caller: Patient Reason for Call: Talk to Nurse, Talk to Doctor Summary of Call: pt rtn your call not sure why you called but would like a rtn call today Initial call taken by: Omer Jack,  April 17, 2010 4:10 PM  Follow-up for Phone Call        pt given results of lab

## 2010-06-27 NOTE — Letter (Signed)
Summary: Certificate of Medical Necessity   Certificate of Medical Necessity   Imported By: Roderic Ovens 08/18/2009 11:43:01  _____________________________________________________________________  External Attachment:    Type:   Image     Comment:   External Document

## 2010-06-27 NOTE — Progress Notes (Signed)
Summary: handicapped sticker and letter to insurance company.  Phone Note Call from Patient   Caller: Patient Summary of Call: Pt needs a handicapped sticker signed.  Then.....she needs a note TO WHOM IT MAY CONCERN stating the date in December that Dr. Clent Ridges instructed her get off of her feet and elevate her legs, and was referred to Cardiology.  Needs to be on official office letter head, and pt needs it for her house insurance policy.  Would like to pick both up ASAP.  Please call when ready.  [phone] Initial call taken by: Lynann Beaver CMA,  June 20, 2009 8:22 AM  Follow-up for Phone Call        make an OV for this, it takes time and we need to get dates straight Follow-up by: Nelwyn Salisbury MD,  June 20, 2009 1:29 PM  Additional Follow-up for Phone Call Additional follow up Details #1::        appt scheduled. Additional Follow-up by: Lynann Beaver CMA,  June 20, 2009 2:55 PM

## 2010-06-27 NOTE — Progress Notes (Signed)
Summary: post cath instructions  Medications Added FUROSEMIDE 40 MG  TABS (FUROSEMIDE) three tablets   by mouth in the morning and two tablets by mouth in the evening       Phone Note Outgoing Call   Call placed by: Katina Dung, RN, BSN,  May 30, 2009 2:19 PM Call placed to: Patient Summary of Call: post cath instructions  Follow-up for Phone Call        post-cath instructions per Dr Lindaann Slough Lasix to 120mg  in the morning and 80mg  in the evening--appt in 10-14 days with Dr Christie Beckers prior to appt with Dr Shirlee Latch in 10-14 days--referral to pulmonary for management of  COPD and right heart failure and evaluation for sleep study--VQ scan to R/O chronic PE ---talked with [name] at [facility] lab and he will discuss with pt      New Problems: COPD (ICD-496)   New Problems: COPD (ICD-496) New/Updated Medications: FUROSEMIDE 40 MG  TABS (FUROSEMIDE) three tablets   by mouth in the morning and two tablets by mouth in the evening

## 2010-06-27 NOTE — Letter (Signed)
Summary: Increase oxygen flow rate  [facility], Main Office  [address]   [place], [place] [address]   Phone: [phone]  Fax: [phone]        August 12, 2009 MRN: [id number]    Jillian Bright    [address] [place], [place]  [address]  Increase  oxygen to 4L/min.          Jillian Trejos,MD           This letter has been electronically signed by your physician.  Appended Document: Increase oxygen flow rate faxed to [facility] [phone]

## 2010-06-27 NOTE — Assessment & Plan Note (Signed)
Summary: 4 month rov/sl  Medications Added FUROSEMIDE 40 MG  TABS (FUROSEMIDE) take one  by mouth two times a day SPIRONOLACTONE 25 MG TABS (SPIRONOLACTONE) one twice a day * CPAP  LIPITOR 40 MG TABS (ATORVASTATIN CALCIUM) one tablet daily      Allergies Added:   Referring Provider:  Marca Ancona Primary Provider:  Nelwyn Salisbury MD   History of Present Illness: 65 yo with history of CAD s/p RCA PCI in 2002 and CHF with peripheral edema presented initially for evaluation of shortness of breath and edema.  Patient had significant peripheral edema in 2009.  Workup at that time revealed a normal stress test and preserved EF on echo. Patient was treated with Lasix and swelling improved.  She redeveloped significant lower extremity edema in early 2001as well .  Echo was done showing RV failure and pulmonary hypertension.   We found her oxygen saturation to be low on room air at rest and with exertion.  Right heart catheterization showed moderate pulmonary hypertension that responded well to oxygen with decreased PA pressure.  She had PFTs that suggested COPD and was diagnosed with OHS/OSA by the pulmonary service.  She has quit smoking and is using CPAP.   On oxygen, she has felt better but still has significant exertional dyspnea.  She does not have any leg swelling.  She is still short of breath with housework or walking about 50 feet.  Weight is down 8 lbs.   Labs (12/10): creatinine 0.8, BNP 73, TSH normal, HCT 42.4, ANA negative Labs (1/11): K 4.4, creatinine 0.8 Labs (6/11): K 3.7, creatinine 0.9, BNP 37, HDL 38, TGs 213, LDL 137  Current Medications (verified): 1)  Spiriva Handihaler 18 Mcg Caps (Tiotropium Bromide Monohydrate) .... One Puff Every Other Day 2)  Symbicort 160-4.5 Mcg/act  Aero (Budesonide-Formoterol Fumarate) .... Two Puffs Twice Daily 3)  Ventolin Hfa 108 (90 Base) Mcg/act  Aers (Albuterol Sulfate) .... 2 Puffs Every 4-6 Hours As Needed 4)  Prevacid 15 Mg Cpdr  (Lansoprazole) .Marland Kitchen.. 1-2 Caps Once Daily 5)  Simvastatin 80 Mg Tabs (Simvastatin) .Marland Kitchen.. 1 By Mouth Once Daily 6)  Furosemide 40 Mg  Tabs (Furosemide) .... Take 2 Tabs By Mouth Two Times A Day 7)  Bayer Low Strength 81 Mg  Tbec (Aspirin) .... Once Daily 8)  Metformin Hcl 1000 Mg  Tabs (Metformin Hcl) .Marland Kitchen.. 1 Tab  By Mouth Two Times A Day 9)  Accu-Chek Aviva  Soln (Blood Glucose Calibration) .... Check Once Daily 10)  Xanax 0.5 Mg Tabs (Alprazolam) .... One Tablet By Mouth Three Times A Day 11)  Neurontin 300 Mg Caps (Gabapentin) .... 3  Times A Day 12)  Glipizide 10 Mg Tabs (Glipizide) .Marland Kitchen.. 1 By Mouth Two Times A Day 13)  Spironolactone 50 Mg Tabs (Spironolactone) .... Take One Tablet Two Times A Day 14)  Zyrtec Allergy 10 Mg Caps (Cetirizine Hcl) .... As Needed 15)  Aleve 220 Mg Tabs (Naproxen Sodium) .... As Needed 16)  Lancets  Misc (Lancets) .... Once Daily 17)  Oxygen .... 2l For 24 Hours 18)  Hydrocodone-Acetaminophen 5-500 Mg Tabs (Hydrocodone-Acetaminophen) .Marland Kitchen.. 1 By Mouth Every 6 Hours As Needed  Allergies (verified): 1)  ! Codeine  Past History:  Past Medical History: Reviewed history from 08/17/2009 and no changes required. 1. GERD 2. Hyperlipidemia 3. Hypertension 4. CAD: MI 2002 with PCI to proximal and distal RCA.  Adenosine myoview (4/09) with EF 72%, no ischemia or infarction. 5. Diabetes mellitus, type II 6.  CHF: Echo (4/09) showed EF 60-65% with mild diastolic dysfunction.  Echo (12/10) showed mild LVH, EF 60-65%, mild AS (mean gradient 13 mmHg), mildly dilated RV with mild to moderate RV systolic dysfunction, and PASP 59 mmHg (moderate pulmonary hypertension).  7.  COPD: Has quit smoking.          - PFT 06/13/09 FEV1 1.97(76%), FVC 2.70(79%), FEV1% 73, TLC 5.35(101%), DLCO 65% 8.  OSA/obesity-hypoventilation        - PSG 06/19/09 AHI 13        - ABG 06/13/09 7.44/50.6/55 on room air 9.  Pulmonary hypertension: Suspect secondary to hypoxic pulmonary vasoconstriction  (COPD, OHS/OSA).  RHC (1/11) showed mean RA pressure 15, PA 57/42 mean 41 on room air and PA 43/24 mean 32 on nonrebreather mask, PCWP 15, SVO2 51%, CI 1.9.   ANA was negative and V/Q scan did not show evidence for chronic PE.    Family History: Reviewed history from 08/17/2009 and no changes required. Family History High cholesterol Family History Hypertension emphysema: maternal grandfather heart disease: father, mother, paternal grandfather clotting disorder: mother (stroke) rheumatism: mother cancer: mother ( female organs) brother (lung)   Social History: Reviewed history from 07/25/2009 and no changes required. 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 pt started smoking at age 41s.  1 1/2 ppd for years but quit 1/11 using Chantix.   Review of Systems       All systems reviewed and negative except as per HPI.   Vital Signs:  Patient profile:   65 year old female Height:      66 inches Weight:      209 pounds BMI:     33.86 O2 Sat:      94 % on Room air Pulse rate:   97 / minute Pulse rhythm:   regular BP sitting:   90 / 62  (left arm) Cuff size:   large  Vitals Entered By: [name] CMA (November 14, 2009 9:37 AM)  O2 Flow:  Room air  Physical Exam  General:  Well developed, well nourished, in no acute distress.  Obese.  Neck:  Neck supple, no JVD. No masses, thyromegaly or abnormal cervical nodes. Lungs:  Diminished breath sounds, no wheezing or rales Heart:  Non-displaced PMI, chest non-tender; regular rate and rhythm, S1, S2 without murmurs, rubs or gallops. Carotid upstroke normal, no bruit.  Pedals normal pulses. No edema, prominent lower leg varicosities.   Abdomen:  Bowel sounds positive; abdomen soft and non-tender without masses, organomegaly, or hernias noted. No hepatosplenomegaly. Extremities:  No clubbing or cyanosis. Neurologic:  Alert and oriented x 3. Psych:  Normal  affect.   Impression & Recommendations:  Problem # 1:  CORONARY ARTERY DISEASE (ICD-414.00) No chest pain, normal LV systolic function on echo.  Continue ASA and statin.    Problem # 2:  HYPERLIPIDEMIA (ICD-272.4) Goal LDL < 70.  She has been on Zocor 80 mg daily, which should be changed.  I will have her start Lipitor 80 mg daily.   Problem # 3:  PULMONARY HYPERTENSION (ICD-416.8) Patient had moderate pulmonary hyperetnsion by echo and cath as well as a dilated RV.  I suspect that this is primarily due to hypoxic pulmonary vasoconstriction from COPD and OSA/OHS.  V/Q scan showed no evidence of chronic PE.  On right heart cath, her mean PA pressure decreased from 41 mmHg to 32 mmHg with oxygen.  She is now  on oxygen at all times.  She is using CPAP.  Right heart hemodynamics will hopefully improve over time on oxygen.  I would like to try to lower her Lasix dose as I think that she is not volume overloaded today and oxygen therapy should have improved her RV function and PA pressure.  I will decrease Lasix to 80 mg qam and 40 mg qpm today and then decrease it to 40 mg two times a day in 1 week.  I will decrease spironolactone to 50 mg qam and 25 mg qpm then decrease it to 25 mg two times a day in 1 week.  I will have her followup in 6 months with an echo beforehand to reassess RV function and PA pressure.   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   Other Orders: Echocardiogram (Echo)  Patient Instructions: 1)  Your physician has recommended you make the following change in your medication:  2)  Stop Simvastatin 3)  Start Lipitor 40mg  daily 4)  Decrease Furosemide to 80mg  in the morning and 40mg  in the evening for 1 week, then decrease to 40mg  twice a day 5)  Decrease Spironolactone to 50mg  in the morning and 25mg  in the evening for 1 week, then decrease to 25mg  twice a day 6)  Your physician recommends that you return for a FASTING lipid profile/liver  profile in 2 months--786.09 272.0 v58.69 416.0 7)  Your physician has requested that you have an echocardiogram.  Echocardiography is a painless test that uses sound waves to create images of your heart. It provides your doctor with information about the size and shape of your heart and how well your heart's chambers and valves are working.  This procedure takes approximately one hour. There are no restrictions for this procedure. In 6 MONTHS 8)  Your physician wants you to follow-up in:  6 months with Dr Shirlee Latch. You will receive a reminder letter in the mail two months in advance. If you don't receive a letter, please call our office to schedule the follow-up appointment. Prescriptions: SPIRONOLACTONE 25 MG TABS (SPIRONOLACTONE) one twice a day  #60 x 11   Entered by:   Katina Dung, RN, BSN   Authorized by:   Marca Ancona, MD   Signed by:   Katina Dung, RN, BSN on 11/14/2009   Method used:   [facility]ron[facility] to        [facility] [address] [phone]* (retail)       [address]       [place], [place]  [address]       Ph: [phone] or [phone]       Fax: [phone]   RxID:   [id number] FUROSEMIDE 40 MG  TABS (FUROSEMIDE) take one  by mouth two times a day  #60 x 11   Entered by:   Katina Dung, RN, BSN   Authorized by:   Marca Ancona, MD   Signed by:   Katina Dung, RN, BSN on 11/14/2009   Method used:   [facility]        [facility] [address] [phone]* (retail)       [address]       [place], [place]  [address]       Ph: [phone] or [phone]       Fax: [phone]   RxID:   [id number] LIPITOR 40 MG TABS (ATORVASTATIN  CALCIUM) one tablet daily  #30 x 3   Entered by:   Katina Dung, RN, BSN   Authorized by:   Marca Ancona, MD   Signed by:   Katina Dung, RN, BSN on 11/14/2009   Method used:   [facility]        [facility] [address] [phone]* (retail)       [address]       [place], [place]  [address]       Ph: [phone] or [phone]       Fax: [phone]   RxID:   [id number]    Appended Document: 4 month rov/sl Dr Shirlee Latch recommended pt take Lipitor 80mg  instead of Lipitor 40mg  --LMTCB  Appended Document: 4 month rov/sl discussed with pt by telephone--she will start Lipitor 80mg    Clinical Lists Changes  Medications: Changed medication from LIPITOR 40 MG TABS (ATORVASTATIN CALCIUM) one tablet daily to LIPITOR 80 MG TABS (ATORVASTATIN CALCIUM) one tablet daily - Signed Rx of LIPITOR 80 MG TABS (ATORVASTATIN CALCIUM) one tablet daily;  #30 x 2;  Signed;  Entered by: Katina Dung, RN, BSN;  Authorized by: Marca Ancona, MD;  Method used: [facility] [address] [phone]*, [address] [place], [place], [place]  [address], Ph: [phone] or [phone], Fax: [phone]    Prescriptions: LIPITOR 80 MG TABS (ATORVASTATIN CALCIUM) one tablet daily  #30 x 2   Entered by:   Katina Dung, RN, BSN   Authorized by:   Marca Ancona, MD   Signed by:   Katina Dung, RN, BSN on 11/14/2009   Method used:   [facility]ronically to        [facility] [address] [phone]* (retail)       [address]       [place], [place]  [address]       Ph: [phone] or [phone]       Fax: [phone]   RxID:   [id number]

## 2010-06-27 NOTE — Miscellaneous (Signed)
Summary: BPAP titration study 08/30/09   Clinical Lists Changes Titrate to BPAP 15/8 with AHI 0.  Observed in REM and supine sleep.  She used 3 liters supplemental oxygen.  She still had oxygen desaturation inspite of using BPAP and 3 liters oxygen.  Will arrange for BPAP 15/10 with 3 liters oxygen.  Will get download from machine, and ONO on this set up. Will f/u in the office 6 to 8 weeks after set up. Orders: Added new Referral order of DME Referral (DME) - Signed

## 2010-06-27 NOTE — Miscellaneous (Signed)
  Clinical Lists Changes  Observations: Added new observation of CARDCATHFIND: IMPRESSION:  This is a 65 year old with right heart failure and moderate pulmonary hypertension.  She additionally has a diagnosis of chronic obstructive pulmonary disease and is hypoxic on room air at rest.  She has been recently started on oxygen.  I suspect that a significant portion of her pulmonary hypertension is due to hypoxic pulmonary vasoconstriction.  On room air, her mean PA saturation was 41 mmHg and when she breathed 100% oxygen mean, PA pressure dropped to 32 mmHg which is significantly different.  Pulmonary capillary wedge pressure was only mildly elevated at most.  I did strongly encourage the patient to wear oxygen at all times.  She also has been started on Spiriva.  She has significant peripheral edema and has very uncomfortable swelling in her ankles.  I am going to go ahead and increase her Lasix to 120 mg in the morning and 80 mg in the evening; however, this probably will not have much effect if she does not wear her oxygen.  I will refer her to pulmonary service for treatment of her chronic obstructive pulmonary disease and also evaluation for sleep apnea.  We will get a VQ scan to rule out chronic pulmonary embolus as a contribution to her presentation.         (05/30/2009 11:59)      Cardiac Cath  Procedure date:  05/30/2009  Findings:      IMPRESSION:  This is a 65 year old with right heart failure and moderate pulmonary hypertension.  She additionally has a diagnosis of chronic obstructive pulmonary disease and is hypoxic on room air at rest.  She has been recently started on oxygen.  I suspect that a significant portion of her pulmonary hypertension is due to hypoxic pulmonary vasoconstriction.  On room air, her mean PA saturation was 41 mmHg and when she breathed 100% oxygen mean, PA pressure dropped to 32 mmHg which is significantly different.  Pulmonary capillary  wedge pressure was only mildly elevated at most.  I did strongly encourage the patient to wear oxygen at all times.  She also has been started on Spiriva.  She has significant peripheral edema and has very uncomfortable swelling in her ankles.  I am going to go ahead and increase her Lasix to 120 mg in the morning and 80 mg in the evening; however, this probably will not have much effect if she does not wear her oxygen.  I will refer her to pulmonary service for treatment of her chronic obstructive pulmonary disease and also evaluation for sleep apnea.  We will get a VQ scan to rule out chronic pulmonary embolus as a contribution to her presentation.

## 2010-06-27 NOTE — Miscellaneous (Signed)
Summary: Orders Update-pna   Clinical Lists Changes  Orders: Added new Service order of Pneumococcal Vaccine (02725) - Signed Added new Service order of Admin 1st Vaccine (36644) - Signed Observations: Added new observation of PNEUMOVAXLOT: 1137aa (04/10/2010 14:59) Added new observation of PNEUMOVAXEXP: 09/21/2011 (04/10/2010 14:59) Added new observation of PNEUMOVAXBY: Zackery Barefoot CMA (04/10/2010 14:59) Added new observation of PNEUMOVAXRTE: IM (04/10/2010 14:59) Added new observation of PNEUMOVAXDOS: 0.5 ml (04/10/2010 14:59) Added new observation of PNEUMOVAXMFR: Merck (04/10/2010 14:59) Added new observation of PNEUMOVAXSIT: left deltoid (04/10/2010 14:59) Added new observation of PNEUMOVAX: Pneumovax (Medicare) (04/10/2010 14:59)      Immunizations Administered:  Pneumonia Vaccine:    Vaccine Type: Pneumovax (Medicare)    Site: left deltoid    Mfr: Merck    Dose: 0.5 ml    Route: IM    Given by: Zackery Barefoot CMA    Exp. Date: 09/21/2011    Lot #: [id number]

## 2010-06-27 NOTE — Assessment & Plan Note (Signed)
Summary: P CATH  Medications Added SPIRONOLACTONE 50 MG TABS (SPIRONOLACTONE) Take 1 tablet by mouth one  time a day      Allergies Added:   Referring Provider:  Marca Ancona Primary Provider:  Nelwyn Salisbury MD  CC:  follow up catheterization.  History of Present Illness: 65 yo with history of CAD s/p RCA PCI in 2002 and CHF with peripheral edema presented initially for evaluation of shortness of breath and edema.  Patient had significant peripheral edema in 2009.  Workup at that time revealed a normal stress test and preserved EF on echo. Patient was treated with Lasix and swelling improved.  She redeveloped significant lower extremity edema over the last 2-3 months.  She got to the point where she was short of breath with pulling her trash can to the street or walking the equivalent of about 1/2 a city block.  Echo was done showing RV failure and pulmonary hypertension.   We found her oxygen saturation to be low on room air at rest and with exertion.  Right heart catheterization showed moderate pulmonary hypertension that responded well to oxygen with decreased PA pressure.  She had PFTs that suggested COPD and had an appointment with the pulmonary service who suspected that she has OSA probably with obesity-hypoventilation syndrome so a sleep study has been set up.    On oxygen, she has felt better and is less short of breath.  Her lower extremity edema has started to resolve with a 13 lb weight loss since I last saw her in clinic.  She quit smoking using Chantix 2 days ago.   Labs (12/10): creatinine 0.8, BNP 73, TSH normal, HCT 42.4, ANA negative  Current Medications (verified): 1)  Prevacid 15 Mg Cpdr (Lansoprazole) .Marland Kitchen.. 1-2 Caps Once Daily 2)  Simvastatin 80 Mg Tabs (Simvastatin) .Marland Kitchen.. 1 By Mouth Once Daily 3)  Furosemide 40 Mg  Tabs (Furosemide) .... Take 2 Tabs By Mouth Two Times A Day 4)  Bayer Low Strength 81 Mg  Tbec (Aspirin) .... Once Daily 5)  Metformin Hcl 1000 Mg  Tabs  (Metformin Hcl) .Marland Kitchen.. 1 Tab  By Mouth Two Times A Day 6)  Accu-Chek Aviva  Soln (Blood Glucose Calibration) .... Check Once Daily 7)  Tessalon Perles 100 Mg Caps (Benzonatate) .Marland Kitchen.. 1 Q 4 Hours As Needed Cough 8)  Xanax 0.5 Mg Tabs (Alprazolam) .... One Tablet By Mouth Three Times A Day 9)  Neurontin 300 Mg Caps (Gabapentin) .... 3  Times A Day 10)  Glipizide 10 Mg Tabs (Glipizide) .Marland Kitchen.. 1 By Mouth Two Times A Day 11)  Vicodin 5-500 Mg Tabs (Hydrocodone-Acetaminophen) .Marland Kitchen.. 1 Q 6 Hours As Needed Pain 12)  Spironolactone 50 Mg Tabs (Spironolactone) .... Take 1 Tablet By Mouth One  Time A Day 13)  Zyrtec Allergy 10 Mg Caps (Cetirizine Hcl) .... As Needed 14)  Mucinex 600 Mg Xr12h-Tab (Guaifenesin) .... As Needed 15)  Aleve 220 Mg Tabs (Naproxen Sodium) .... As Needed 16)  Spiriva Handihaler 18 Mcg Caps (Tiotropium Bromide Monohydrate) .... Use Daily As Directed 17)  Chantix Starting Month Pak 0.5 Mg X 11 & 1 Mg X 42 Tabs (Varenicline Tartrate) .... Use As Directed 18)  Chantix Continuing Month Pak 1 Mg Tabs (Varenicline Tartrate) .... Use As Directed 19)  Lancets  Misc (Lancets) .... Once Daily 20)  Symbicort 160-4.5 Mcg/act  Aero (Budesonide-Formoterol Fumarate) .... Two Puffs Twice Daily 21)  Ventolin Hfa 108 (90 Base) Mcg/act  Aers (Albuterol Sulfate) .... 2 Puffs  Every 4-6 Hours As Needed  Allergies (verified): 1)  ! Codeine  Past History:  Past Medical History: 1. GERD 2. Hyperlipidemia 3. Hypertension 4. CAD: MI 2002 with PCI to proximal and distal RCA.  Adenosine myoview (4/09) with EF 72%, no ischemia or infarction.  5. Diabetes mellitus, type II 6. CHF: Echo (4/09) showed EF 60-65% with mild diastolic dysfunction.  Echo (12/10) showed mild LVH, EF 60-65%, mild AS (mean gradient 13 mmHg), mildly dilated RV with mild to moderate RV systolic dysfunction, and PASP 59 mmHg (moderate pulmonary hypertension).  7.  COPD 8.  Possible OSA/obesity-hypoventilation 9.  Pulmonary hypertension:  Suspect secondary to hypoxic pulmonary vasoconstriction (COPD, ? OHS).  RHC (1/11) showed mean RA pressure 15, PA 57/42 mean 41 on room air and PA 43/24 mean 32 on nonrebreather mask, PCWP 15, SVO2 51%, CI 1.9.   ANA was negative and V/Q scan did not show evidence for chronic PE.    Family History: Reviewed history from 06/10/2009 and no changes required. 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   pt is divorced and now single. pt has no children. pt started smoking at age 50s.  1 1/2 ppd for years but quit 1/11 using Chantix.   Review of Systems       All systems reviewed and negative except as per HPI.   Vital Signs:  Patient profile:   65 year old female Height:      66 inches Weight:      213 pounds BMI:     34.50 O2 Sat:      91 % on Room air Pulse rate:   92 / minute Pulse rhythm:   regular BP sitting:   106 / 64  (left arm) Cuff size:   regular  Vitals Entered By: [name] CMA (June 14, 2009 11:35 AM)  O2 Flow:  Room air  Physical Exam  General:  obese female in nad Neck:  Neck thick, JVP difficult. No masses, thyromegaly or abnormal cervical nodes. Lungs:  Decreased breath sounds bilaterally.  Heart:  Non-displaced PMI, chest non-tender; regular rate and rhythm, S1, S2 without rubs or gallops. 1/6 HSM LLSB.  Carotid upstroke normal, no bruit.  Pedals normal pulses. 1+ edema to knees, improved.  Abdomen:  Bowel sounds positive; abdomen soft and non-tender without masses, organomegaly, or hernias noted. No hepatosplenomegaly. Extremities:  No clubbing or cyanosis. Neurologic:  Alert and oriented x 3. Psych:  Normal affect.   Impression & Recommendations:  Problem # 1:  PULMONARY  HYPERTENSION (ICD-416.8) Patient has moderate pulmonary hyperetnsion by echo and cath as well as a dilated RV.  I suspect that this is primarily due to hypoxic pulmonary vasoconstriction from COPD and possible untreated OSA/OHS.  V/Q scan showed no evidence of chronic PE.  On right heart cath, her mean PA pressure decreased from 41 mmHg to 32 mmHg with oxygen.  She will continue home oxygen and will be evaluated for OSA/OHS (sleep study this weekend).  It will be imperative for her to continue to use her oxygen.  She actually seems to be doing quite a bit better since starting oxygen.  Weight is down 13 lbs.  Possibly with treatment of hypoxia, RV function has improved (because of lower PA pressure).   - Decrease Lasix back  to 80 mg two times a day (and may be able to lower further).  - BMET today.   Problem # 2:  CORONARY ARTERY DISEASE (ICD-414.00) No chest pain, normal LV systolic function on echo.  Continue ASA and statin.   Problem # 3:  SMOKING It is imperative that she stay off cigarettes.  Continue Chantix.   Other Orders: TLB-BMP (Basic Metabolic Panel-BMET) (80048-METABOL)  Patient Instructions: 1)  Your physician has recommended you make the following change in your medication:  2)  Decrease Lasix(furosemide) to 80mg  twice a day 3)  Your physician recommends that you have lab work today--BMP 428.32  4)  Your physician recommends that you schedule a follow-up appointment in: 6 weeks with Dr. Marca Ancona

## 2010-06-27 NOTE — Miscellaneous (Signed)
Summary: CPAP Bi-Level Set up info/[facility]  CPAP Bi-Level Set up info/[facility]   Imported By: Sherian Rein 11/17/2009 15:26:26  _____________________________________________________________________  External Attachment:    Type:   Image     Comment:   External Document

## 2010-06-27 NOTE — Progress Notes (Signed)
  Pt is requesting a coupon for Chantix.  Told pt that I would check for one and if we had one I would call her back.  She will either pick it up or have it mailed.  Follow up needed.  [name], CMA/AAMA Pt contacted to inform we do not have any coupons for Chantix.  I checked with [facility], and in supply closet.  Pt will need to check with her pharmacy for programs.     Follow-up for Phone Call       Follow-up by: [name] CMA,  July 15, 2009 8:54 AM

## 2010-06-27 NOTE — Progress Notes (Signed)
Summary: Pt req script for Accu Check Test Strips and Lancets  Phone Note Call from Patient Call back at Home Phone [phone]   Caller: Patient Summary of Call: Pt called and said that [facility] will cover Accu- check  testing strips at [facility] at [address]. Pts script has expired and needs new script and also for the lancets.  Initial call taken by: Lucy Antigua,  June 06, 2009 11:47 AM  Follow-up for Phone Call        Phone Call Completed, Rx Called In Follow-up by: Alfred Levins, CMA,  June 06, 2009 2:16 PM    New/Updated Medications: LANCETS  MISC (LANCETS) once daily Prescriptions: LANCETS  MISC (LANCETS) once daily  #50 x 11   Entered by:   Alfred Levins, CMA   Authorized by:   Nelwyn Salisbury MD   Signed by:   Alfred Levins, CMA on 06/06/2009   Method used:   Electronically to        [facility] [address] [phone]* (retail)       [address]       [address]       [place], [place]  [address]       Ph: [phone]       Fax: [phone]   RxID:   [id number] ACCU-CHEK AVIVA  SOLN (BLOOD GLUCOSE CALIBRATION) check once daily  #50 x 11   Entered by:   Alfred Levins, CMA   Authorized by:   Nelwyn Salisbury MD   Signed by:   Alfred Levins, CMA on 06/06/2009   Method used:   Electronically to        [facility] [address] [phone]* (retail)       [address]       [address]       [place], [place]  [address]       Ph: [phone]       Fax: [phone]   RxID:   [id number]

## 2010-06-27 NOTE — Miscellaneous (Signed)
Summary: Overnight oximetry on 3 liters oxygen   Clinical Lists Changes Test time 6hrs 39 min.  Mean SpO2 90%, low 67%.  Spent 44 min (11%) with SpO2 < 88%.  Explained that supplemental oxygen is helping, but not sufficient.  Explained that she would need to use either CPAP or BPAP to get maximal benefit.  Offered to assist in getting her used to using PAP therapy.  She again expressed great reluctance in using PAP therapy.  Will continue on her current oxygen set up.  Advised her to contact me if she changes her mind about using PAP.  Will also discuss further at her next ROV.

## 2010-06-27 NOTE — Progress Notes (Signed)
Summary: Status of Paperwork  Phone Note Call from Patient   Caller: Patient [phone] Reason for Call: Talk to Nurse, Talk to Doctor Summary of Call: Pt called to ck on status of paperwork that she brought in for Dr Clent Ridges / Arline Asp, CMA to fill out.... Pt adv that she can be reached @ [phone] when same is ready.  Initial call taken by: Debbra Riding,  June 24, 2009 2:29 PM  Follow-up for Phone Call        forms are done and signed, ready for p/u Follow-up by: Alfred Levins, CMA,  June 24, 2009 3:24 PM

## 2010-06-27 NOTE — Miscellaneous (Signed)
Summary: Fluvirin/[facility]  [facility]in/[facility]   Imported By: Lester Wayland 04/17/2010 08:01:46  _____________________________________________________________________  External Attachment:    Type:   Image     Comment:   External Document

## 2010-06-27 NOTE — Progress Notes (Signed)
Summary: sleep study   [id number]  Phone Note Call from Patient Call back at Home Phone [phone]   Caller: Patient Call For: clance Summary of Call: Scheduled for sleep on 1/23, wants to know if this is absolutely necessary, re: insurance issue. Please advise. Initial call taken by: Darletta Moll,  June 14, 2009 9:08 AM  Follow-up for Phone Call        [facility].   Aundra Millet Reynolds LPN  June 14, 2009 9:23 AM  pt returned my call.  pt just wanted to check with [facility] to make sure "sleep study was absolutely necessary" because pt states study is very expensive and has concerns regarding this.  Will forward message to [facility] to address.  Aundra Millet Reynolds LPN  June 14, 2009 10:55 AM   Additional Follow-up for Phone Call Additional follow up Details #1::        Her abnormal pressure may be from sleep apnea, which is a treatable disease.  It is absolutely necessary. Additional Follow-up by: Barbaraann Share MD,  June 14, 2009 11:23 AM    Additional Follow-up for Phone Call Additional follow up Details #2::    [facility] informing pt per [facility] test is absolutely necessary.  Aundra Millet Reynolds LPN  June 14, 2009 11:46 AM

## 2010-06-27 NOTE — Miscellaneous (Signed)
Summary: [facility]  [facility]   Imported By: Kassie Mends 08/08/2009 13:14:57  _____________________________________________________________________  External Attachment:    Type:   Image     Comment:   External Document

## 2010-06-27 NOTE — Miscellaneous (Signed)
Summary: pulmonary consult for hypoxemia, dyspnea   Clinical Lists Changes     CC:  Pulmonary Consult.  History of Present Illness: The pt is a very complicated 65y/o female who I have been asked to see for pulmonary htn and obstructive lung disease.  She has had a recent echo which showed mild diastolic dysfunction, mildly dilated RV, and elevated pulmonary artery pressures.  She underwent right heart cath the beginning of the year which showed PA pressure of 43/24, PAOP of 15, CO 3.5, and a calculated PVR at 4 wood units.  She has had a v/q scan with nothing to suggest chronic thromboembolic disease, but has been found to have chronic hypoxemia which is now being treated with supplemental oxygen.  She has had spirometry which showed an abnormal FVC and FEV1, but her ratio is normal.  Lung volumes are not available to differentiate btw obstruction vs. restriction.  Current Medications (verified): 1)  Prevacid 15 Mg Cpdr (Lansoprazole) .Marland Kitchen.. 1-2 Caps Once Daily 2)  Simvastatin 80 Mg Tabs (Simvastatin) .Marland Kitchen.. 1 By Mouth Once Daily 3)  Furosemide 40 Mg  Tabs (Furosemide) .... Take 2 Tabs By Mouth Two Times A Day 4)  Bayer Low Strength 81 Mg  Tbec (Aspirin) .... Once Daily 5)  Metformin Hcl 1000 Mg  Tabs (Metformin Hcl) .Marland Kitchen.. 1 Tab  By Mouth Two Times A Day 6)  Accu-Chek Aviva  Soln (Blood Glucose Calibration) .... Check Once Daily 7)  Tessalon Perles 100 Mg Caps (Benzonatate) .Marland Kitchen.. 1 Q 4 Hours As Needed Cough 8)  Xanax 0.5 Mg Tabs (Alprazolam) .... One Tablet By Mouth Three Times A Day 9)  Neurontin 300 Mg Caps (Gabapentin) .... 3  Times A Day 10)  Glipizide 10 Mg Tabs (Glipizide) .Marland Kitchen.. 1 By Mouth Two Times A Day 11)  Vicodin 5-500 Mg Tabs (Hydrocodone-Acetaminophen) .Marland Kitchen.. 1 Q 6 Hours As Needed Pain 12)  Spironolactone 50 Mg Tabs (Spironolactone) .... Take 1 Tablet By Mouth Two Times A Day 13)  Zyrtec Allergy 10 Mg Caps (Cetirizine Hcl) .... As Needed 14)  Mucinex 600 Mg Xr12h-Tab (Guaifenesin)  .... As Needed 15)  Aleve 220 Mg Tabs (Naproxen Sodium) .... As Needed 16)  Spiriva Handihaler 18 Mcg Caps (Tiotropium Bromide Monohydrate) .... Use Daily As Directed 17)  Chantix Starting Month Pak 0.5 Mg X 11 & 1 Mg X 42 Tabs (Varenicline Tartrate) .... Use As Directed 18)  Chantix Continuing Month Pak 1 Mg Tabs (Varenicline Tartrate) .... Use As Directed 19)  Lancets  Misc (Lancets) .... Once Daily  Allergies: 1)  ! Codeine  Past History:  Past Medical History: Reviewed history from 05/25/2009 and no changes required. 1. GERD 2. Hyperlipidemia 3. Hypertension 4. CAD: MI 2002 with PCI to proximal and distal RCA.  Adenosine myoview (4/09) with EF 72%, no ischemia or infarction.  5. Diabetes mellitus, type II 6. CHF: Echo (4/09) showed EF 60-65% with mild diastolic dysfunction.  Echo (12/10) showed mild LVH, EF 60-65%, mild AS (mean gradient 13 mmHg), mildly dilated RV with mild to moderate RV systolic dysfunction, and PASP 59 mmHg (moderate pulmonary hypertension).  7.  Probable COPD with active tobacco abuse.   Past Surgical History: Lumbar disk surgery 1985 Appendectomy 1960s Hemorrhoidectomy Tonsillectomy and adenoidectomy colonoscopy 09-23-03 per Dr. 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   pt is divorced and now single. pt has no children. pt started smoking at age 11s.  1 1/2 ppd.   Review of Systems       The patient complains of shortness of breath with activity, shortness of breath at rest, productive cough, non-productive cough, chest pain, weight change, tooth/dental problems, headaches, sneezing, itching, anxiety, hand/feet  swelling, joint stiffness or pain, and change in color of mucus.  The patient denies coughing up blood, irregular heartbeats, acid heartburn, indigestion, loss of appetite, abdominal pain, difficulty swallowing, sore throat, nasal congestion/difficulty breathing through nose, ear ache, depression, rash, and fever.    Vital Signs:  Patient profile:   65 year old female Height:      66 inches Weight:      220.50 pounds BMI:     35.72 O2 Sat:      91 % on Room air Temp:     98.0 degrees F oral Pulse rate:   90 / minute BP sitting:   108 / 60  (left arm) Cuff size:   regular  Vitals Entered By: Arman Filter LPN (June 10, 2009 9:22 AM)  O2 Flow:  Room air CC: Pulmonary Consult Comments Medications reviewed with patient  Arman Filter LPN  June 10, 2009 9:22 AM    Physical Exam  General:  obese female in nad Eyes:  PERRLA and EOMI.   Nose:  mild narrowing bilat Mouth:  moderate elongation of soft palate and uvula Neck:  no jvd, tmg, LN Lungs:  mod decrease in bs bilat, no wheezing, mild rhonchi Heart:  rrr, 2/6 sem Abdomen:  soft and nontender, bs+ Extremities:  2+ pedal edema, no cyanosis pulses intact distally Neurologic:  alert, but sleepy, answers all questions appropriately. moves all 4.   Impression & Recommendations:  Problem # 1:  PULMONARY HYPERTENSION (ICD-416.8) the pt has secondary pulmonary htn that is multifactorial.  She clearly has chronic hypoxemia, underlying obstructive lung disease, probable osa/ohs.  She does not have chronic thromboembolic disease by v/q, and unlikely to have autoimmune disease.  At this point, the treatment should consist of correcting her hypoxemia with supplemental oxygen, checking a sleep study ( I suspect she has both osa and ohs), and aggressive treatment of her obstructive lung disease.  She will also need to continue her diuresis per cardiology.  Once we have all the treatments in place, would recheck echo in 6mos to see if we  have made any headway.  I have also encouraged her to work aggressively on weight loss.  Problem # 2:  COPD (ICD-496)  The pt has a decreased FEV1 and FVC with a normal FEV1/FVC.  It is unclear how much of this is due to restriction, and how much may be due to airtrapping (i.e.  obstruction).  She will need lung volumes to sort this out.  She obviously has some degree of emphysema though.  It will be essential for her to stop smoking, and I have told her she will never improve until she does.  I would like to treat her with an aggressive bronchodilator regimen, but the pt states that she cannot afford them.  I have given her samples, and have referred to pt assistance program for assistance.  I have also told her the money she will save from smoking cessation can also be used for her meds!!  She has a very elevated serum bicarbonate, and I suspect she has  significant CO2 retention.  This could be from copd or ohs.  Will check abg.  Her updated medication list for this problem includes:    Spiriva Handihaler 18 Mcg Caps (Tiotropium bromide monohydrate) ..... Use daily as directed    Symbicort 160-4.5 Mcg/act Aero (Budesonide-formoterol fumarate) .Marland Kitchen..Marland Kitchen Two puffs twice daily    Ventolin Hfa 108 (90 Base) Mcg/act Aers (Albuterol sulfate) .Marland Kitchen... 2 puffs every 4-6 hours as needed    Spiriva Handihaler 18 Mcg Caps (Tiotropium bromide monohydrate) ..... One puff in handihaler daily  Orders: Consultation Level V (45409) Pulmonary Referral (Pulmonary) Misc. Referral (Misc. Ref) Sleep Disorder Referral (Sleep Disorder)  Medications Added to Medication List This Visit: 1)  Furosemide 40 Mg Tabs (Furosemide) .... Take 2 tabs by mouth two times a day 2)  Spironolactone 50 Mg Tabs (Spironolactone) .... Take 1 tablet by mouth two times a day 3)  Symbicort 160-4.5 Mcg/act Aero (Budesonide-formoterol fumarate) .... Two puffs twice daily 4)  Ventolin Hfa 108 (90 Base) Mcg/act Aers (Albuterol sulfate) .... 2 puffs  every 4-6 hours as needed 5)  Spiriva Handihaler 18 Mcg Caps (Tiotropium bromide monohydrate) .... One puff in handihaler daily  Patient Instructions: 1)  will set up for sleep study 2)  will set up for arterial blood gas and full breathing studies. 3)  you must stop smoking!! 4)  spiriva one each am 5)  symbicort 160/4.5  2 inhalations am and pm...rinse mouth 6)  ventolin (albuterol)  2 inhalations every 6 hrs if needed for rescue/emergencies only. 7)  will see if you qualify for pt assistance program for inhalers 8)  work on weight loss 9)  stay on oxygen 24/7 for now 10)  continue your fluid medicine per Dr Shirlee Latch 11)  followup with me in 3 weeks.   Prescriptions: SPIRIVA HANDIHALER 18 MCG  CAPS (TIOTROPIUM BROMIDE MONOHYDRATE) one puff in handihaler daily  #30 x 6   Entered and Authorized by:   Barbaraann Share MD   Signed by:   Barbaraann Share MD on 06/10/2009   Method used:   Print then Give to Patient   RxID:   [id number] VENTOLIN HFA 108 (90 BASE) MCG/ACT  AERS (ALBUTEROL SULFATE) 2 puffs every 4-6 hours as needed  #1 x 6   Entered and Authorized by:   Barbaraann Share MD   Signed by:   Barbaraann Share MD on 06/10/2009   Method used:   Print then Give to Patient   RxID:   [id number] SYMBICORT 160-4.5 MCG/ACT  AERO (BUDESONIDE-FORMOTEROL FUMARATE) Two puffs twice daily  #1 x 6   Entered and Authorized by:   Barbaraann Share MD   Signed by:   Barbaraann Share MD on 06/10/2009   Method used:   Print then Give to Patient   RxID:   [id number]     Signed by Jenelle Mages on 06/10/2009 at 6:07 PM  ________________________________________________________________________ This is an addendum to the above consult note..someone signed my note who was not authorized to do so before the note was finished.    Copy to:  Marca Ancona Primary Provider/Referring Provider:  Nelwyn Salisbury MD   History of Present Illness: The pt has had breathing issues for at least 2  years, and has been progressive in nature.   The pt describes less than one block doe at a moderate pace, and sometimes can get sob just walking through the house.  This is better since she has been diuresed.  She  describes a cough with white mucus, but no purulence.  She has had very severe LE edema.    Allergies: 1)  ! Codeine    Signed by Barbaraann Share MD on 06/14/2009 at 11:30 AM  ________________________________________________________________________

## 2010-06-27 NOTE — Miscellaneous (Signed)
Summary: [facility] Orders  [facility] Orders   Imported By: Kassie Mends 06/01/2009 09:58:13  _____________________________________________________________________  External Attachment:    Type:   Image     Comment:   External Document

## 2010-06-27 NOTE — Letter (Signed)
Summary: Certificate of Medical Necessity   Certificate of Medical Necessity   Imported By: Roderic Ovens 08/18/2009 11:27:47  _____________________________________________________________________  External Attachment:    Type:   Image     Comment:   External Document

## 2010-06-27 NOTE — Progress Notes (Signed)
Summary: need to sched ov with KC  [id number]  ---- Converted from flag ---- ---- 07/06/2009 10:41 AM, Arman Filter LPN wrote: pt needs ov with kc to discuss sleep study results. ------------------------------  [id number].  Aundra Millet Reynolds LPN  July 06, 2009 10:44 AM  Phone Note Call from Patient   Caller: Patient Call For: clance Summary of Call: pt in on 07/13/2009 @ 10:15am to discuss sleep results and pft's. Initial call taken by: Eugene Gavia,  July 06, 2009 3:25 PM

## 2010-06-27 NOTE — Letter (Signed)
Summary: PHysician Statement for Work Release  PHysician Statement for Work Release   Imported By: Maryln Gottron 07/25/2009 10:27:08  _____________________________________________________________________  External Attachment:    Type:   Image     Comment:   External Document

## 2010-06-27 NOTE — Progress Notes (Signed)
   Recieved paper pt left today while @ Appt w/ Dr.McLean..please complete and call when ready pt will pick-up form from [facility]..forwarded to [place] for processing [facility]  August 12, 2009 12:42 PM

## 2010-06-27 NOTE — Progress Notes (Signed)
Summary: oxygen   Phone Note Call from Patient Call back at Work Phone [phone]   Caller: Patient Summary of Call: calling again about oxygen, has not heard anything back, request call back, needs this done by Monday Initial call taken by: Migdalia Dk,  Oct 14, 2009 10:32 AM  Follow-up for Phone Call        I called and spoke with the pt. She states she has paperwork that Dr. Shirlee Latch needs to fill out in order for her to fly with oxygen next week. She needs this filled out by monday so she can fly out on tues. It has to last her 10 days after being signed. I explained I will need to contact Dr. Shirlee Latch and run this by him. I will call the pt back.  Follow-up by: Sherri Rad, RN, BSN,  Oct 14, 2009 10:47 AM     Appended Document: oxygen That is fine.  I am not in office today.  Maybe DOD will sign paperwork for her to fly with oxygen?   Appended Document: oxygen I spoke with Dr. Shirlee Latch. He states it is ok to fill this paperwork out and have the DOD sign. I have contacted the pt. She will try to get this to [place] today and I will call her on monday after it has been completed and signed. She verbalizes understanding.   Appended Document: oxygen The pt's form was signed by Dr. Jens Som and the pt is aware it has been placed at the front desk for pick up.

## 2010-06-27 NOTE — Progress Notes (Signed)
Summary: results  Phone Note Call from Patient Call back at Home Phone [phone]   Caller: Patient Call For: clance Summary of Call: pt had tests done at [facility] last fri 1/17. she was told [facility] had the results. she wants to hear from nurse "one way or the other today".  Initial call taken by: Tivis Ringer,  June 14, 2009 1:49 PM  Follow-up for Phone Call        pt calling for PFT and ABG results done at [facility] yesterday 06-13-2009.  Informed pt KC out of the office this afternoon and will return tomorrow.  Arman Filter LPN  June 14, 2009 3:30 PM   test results in [facility] very important look at folder.  Aundra Millet Reynolds LPN  June 15, 2009 3:18 PM   Additional Follow-up for Phone Call Additional follow up Details #1::        noted.  see repeat phone note. Additional Follow-up by: Barbaraann Share MD,  June 15, 2009 5:01 PM

## 2010-06-27 NOTE — Progress Notes (Signed)
Summary: refill xanax  Phone Note Refill Request Message from:  Pharmacy on August 18, 2009 9:41 AM  Refills Requested: Medication #1:  XANAX 0.5 MG TABS one tablet by mouth three times a day   Dosage confirmed as above?Dosage Confirmed   Supply Requested: 1 month   Last Refilled: 07/26/2009 Initial call taken by: Raechel Ache, RN,  August 18, 2009 9:41 AM  Follow-up for Phone Call        call in #90 with 5 rf Follow-up by: Nelwyn Salisbury MD,  August 18, 2009 12:21 PM  Additional Follow-up for Phone Call Additional follow up Details #1::        Rx called to pharmacy Additional Follow-up by: Raechel Ache, RN,  August 18, 2009 12:34 PM    Prescriptions: Prudy Feeler 0.5 MG TABS (ALPRAZOLAM) one tablet by mouth three times a day  #90 x 5   Entered by:   Raechel Ache, RN   Authorized by:   Nelwyn Salisbury MD   Signed by:   Raechel Ache, RN on 08/18/2009   Method used:   Historical   RxID:   [id number]

## 2010-06-27 NOTE — Progress Notes (Signed)
Summary: questions re med   Phone Note Call from Patient   Refills Requested: Medication #1:  CRESTOR 40 MG TABS one daily  Medication #2:  VENTOLIN HFA 108 (90 BASE) MCG/ACT  AERS 2 puffs every 4-6 hours as needed Reason for Call: Talk to Nurse Summary of Call: pt has questions re [phone]  Initial call taken by: Glynda Jaeger,  February 08, 2010 4:58 PM  Follow-up for Phone Call        discussed meds with pt ---pt states her face/hands/feet are puffy--she has not weighed regularly and she states there has not been a change in her breathing--she saw Dr Craige Cotta 01/16/10 and her symptoms are unchanged from that time--currently she is taking Lasix 40mg  twice a day --I will review with Dr Gae Gallop, RN, BSN  February 08, 2010 5:48 PM --reviewed with Dr Darvin Neighbours recommended pt weigh daily and if increase  in weight or SOB to call  [facility] Katina Dung, RN, BSN  February 09, 2010 1:49 PM   Additional Follow-up for Phone Call Additional follow up Details #1::        pt returned call-pls call [phone] Glynda Jaeger  February 09, 2010 4:35 PM--I talked with pt by telephone--she is aware of Dr Alford Highland recommendations

## 2010-06-27 NOTE — Assessment & Plan Note (Signed)
Summary: consult re: swelling/dissability/cjr   Vital Signs:  Patient profile:   65 year old female Weight:      214 pounds Temp:     97.6 degrees F oral BP sitting:   104 / 70  (left arm) Cuff size:   regular  Vitals Entered By: Alfred Levins, CMA (July 21, 2009 9:06 AM) CC: discuss paperwork   History of Present Illness: Here to follow up on CHF, pulmonary HTN, and CPOD among other things. She is seeing Dr. Shirlee Latch within the week, and she will see Dr. Craige Cotta next month. She is doing about the same, she admits that wearing her oxygen continuously helps her feel much better. She remains off of cigarettes, and she is proud of this achievement. She brings more frome with her today to be filled out as to her disability status.   Current Medications (verified): 1)  Prevacid 15 Mg Cpdr (Lansoprazole) .Marland Kitchen.. 1-2 Caps Once Daily 2)  Simvastatin 80 Mg Tabs (Simvastatin) .Marland Kitchen.. 1 By Mouth Once Daily 3)  Furosemide 40 Mg  Tabs (Furosemide) .... Take 2 Tabs By Mouth Two Times A Day 4)  Bayer Low Strength 81 Mg  Tbec (Aspirin) .... Once Daily 5)  Metformin Hcl 1000 Mg  Tabs (Metformin Hcl) .Marland Kitchen.. 1 Tab  By Mouth Two Times A Day 6)  Accu-Chek Aviva  Soln (Blood Glucose Calibration) .... Check Once Daily 7)  Tessalon Perles 100 Mg Caps (Benzonatate) .Marland Kitchen.. 1 Q 4 Hours As Needed Cough 8)  Xanax 0.5 Mg Tabs (Alprazolam) .... One Tablet By Mouth Three Times A Day 9)  Neurontin 300 Mg Caps (Gabapentin) .... 3  Times A Day 10)  Glipizide 10 Mg Tabs (Glipizide) .Marland Kitchen.. 1 By Mouth Two Times A Day 11)  Vicodin 5-500 Mg Tabs (Hydrocodone-Acetaminophen) .Marland Kitchen.. 1 Q 6 Hours As Needed Pain 12)  Spironolactone 50 Mg Tabs (Spironolactone) .... Take 1 Tablet By Mouth One  Time A Day 13)  Zyrtec Allergy 10 Mg Caps (Cetirizine Hcl) .... As Needed 14)  Mucinex 600 Mg Xr12h-Tab (Guaifenesin) .... As Needed 15)  Aleve 220 Mg Tabs (Naproxen Sodium) .... As Needed 16)  Chantix Continuing Month Pak 1 Mg Tabs (Varenicline Tartrate)  .... Use As Directed 17)  Lancets  Misc (Lancets) .... Once Daily 18)  Symbicort 160-4.5 Mcg/act  Aero (Budesonide-Formoterol Fumarate) .... Two Puffs Twice Daily 19)  Ventolin Hfa 108 (90 Base) Mcg/act  Aers (Albuterol Sulfate) .... 2 Puffs Every 4-6 Hours As Needed 20)  Oxygen .... 2l For 24 Hours  Allergies (verified): 1)  ! Codeine  Past History:  Past Medical History: 1. GERD 2. Hyperlipidemia 3. Hypertension 4. CAD: MI 2002 with PCI to proximal and distal RCA.  Adenosine myoview (4/09) with EF 72%, no ischemia or infarction. Sees Dr. Marca Ancona 5. Diabetes mellitus, type II 6. CHF: Echo (4/09) showed EF 60-65% with mild diastolic dysfunction.  Echo (12/10) showed mild LVH, EF 60-65%, mild AS (mean gradient 13 mmHg), mildly dilated RV with mild to moderate RV systolic dysfunction, and PASP 59 mmHg (moderate pulmonary hypertension).  7.  COPD, sees Dr. Marcelyn Bruins  8.  OSA/obesity-hypoventilation 9.  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 OHS).  RHC (1/11) showed mean RA pressure 15, PA 57/42 mean 41 on room air and PA 43/24 mean 32 on nonrebreather mask, PCWP 15, SVO2 51%, CI 1.9.   ANA was negative and V/Q scan did not show evidence for chronic PE.    Past Surgical History:  Reviewed history from 06/10/2009 and no changes required. Lumbar disk surgery 1985 Appendectomy 1960s Hemorrhoidectomy Tonsillectomy and adenoidectomy colonoscopy 09-23-03 per Dr. Corinda Gubler, repeat 5 yrs stents 2002  Review of Systems  The patient denies anorexia, fever, weight loss, weight gain, vision loss, decreased hearing, hoarseness, chest pain, syncope, peripheral edema, prolonged cough, headaches, hemoptysis, abdominal pain, melena, hematochezia, severe indigestion/heartburn, hematuria, incontinence, genital sores, muscle weakness, suspicious skin lesions, transient blindness, difficulty walking, depression, unusual weight change, abnormal bleeding,  enlarged lymph nodes, angioedema, breast masses, and testicular masses.    Physical Exam  General:  Well-developed,well-nourished,in no acute distress; alert,appropriate and cooperative throughout examination Lungs:  Normal respiratory effort, chest expands symmetrically. Lungs are clear to auscultation, no crackles or wheezes. Heart:  Normal rate and regular rhythm. S1 and S2 normal without gallop, murmur, click, rub or other extra sounds.   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 2 puffs every 4-6 hours as needed  Problem # 4:  PULMONARY HYPERTENSION (ICD-416.8)  Problem # 5:  CONGESTIVE HEART FAILURE (ICD-428.0)  Her updated medication list for this problem includes:    Furosemide 40 Mg Tabs (Furosemide) .Marland Kitchen... Take 2 tabs by mouth two times a day    Bayer Low Strength 81 Mg Tbec (Aspirin) ..... Once daily    Spironolactone 50 Mg Tabs (Spironolactone) .Marland Kitchen... Take 1 tablet by mouth one  time a day  Complete Medication List: 1)  Prevacid 15 Mg Cpdr (Lansoprazole) .Marland Kitchen.. 1-2 caps once daily 2)  Simvastatin 80 Mg Tabs (Simvastatin) .Marland Kitchen.. 1 by mouth once daily 3)  Furosemide 40 Mg Tabs (Furosemide) .... Take 2 tabs by mouth two times a day 4)  Bayer Low Strength 81 Mg Tbec (Aspirin) .... Once daily 5)  Metformin Hcl 1000 Mg Tabs (Metformin hcl) .Marland Kitchen.. 1 tab  by mouth two times a day 6)  Accu-chek Aviva Soln (Blood glucose calibration) .... Check once daily 7)  Tessalon Perles 100 Mg Caps (Benzonatate) .Marland Kitchen.. 1 q 4 hours as needed cough 8)  Xanax 0.5 Mg Tabs (Alprazolam) .... One tablet by mouth three times a day 9)  Neurontin 300 Mg Caps  (Gabapentin) .... 3  times a day 10)  Glipizide 10 Mg Tabs (Glipizide) .Marland Kitchen.. 1 by mouth two times a day 11)  Vicodin 5-500 Mg Tabs (Hydrocodone-acetaminophen) .Marland Kitchen.. 1 q 6 hours as needed pain 12)  Spironolactone 50 Mg Tabs (Spironolactone) .... Take 1 tablet by mouth one  time a day 13)  Zyrtec Allergy 10 Mg Caps (Cetirizine hcl) .... As needed 14)  Mucinex 600 Mg Xr12h-tab (Guaifenesin) .... As needed 15)  Aleve 220 Mg Tabs (Naproxen sodium) .... As needed 16)  Chantix Continuing Month Pak 1 Mg Tabs (Varenicline tartrate) .... Use as directed 17)  Lancets Misc (Lancets) .... Once daily 18)  Symbicort 160-4.5 Mcg/act Aero (Budesonide-formoterol fumarate) .... Two puffs twice daily 19)  Ventolin Hfa 108 (90 Base) Mcg/act Aers (Albuterol sulfate) .... 2 puffs every 4-6 hours as needed 20)  Oxygen  .... 2l for 24 hours  Patient Instructions: 1)  I did fill out her papers certifying her a disabled, but of course I cannot conjecture as to when (if ever)  she could return to work. These decisions need to be made by Dr. Shirlee Latch and Dr. Craige Cotta, and  I communicated this to her insurance company. I congratulated her again on quitting smoking. Prescriptions: VICODIN 5-500 MG TABS (HYDROCODONE-ACETAMINOPHEN) 1 q 6 hours as needed pain  #120 x 2   Entered and Authorized by:   Nelwyn Salisbury MD   Signed by:   Nelwyn Salisbury MD on 07/21/2009   Method used:   Print then Give to Patient   RxID:   [id number]

## 2010-06-29 NOTE — Letter (Signed)
Summary: CMN for Oxygen / [facility]  PT Order / [facility]   Imported By: Lennie Odor 06/19/2010 12:00:22  _____________________________________________________________________  External Attachment:    Type:   Image     Comment:   External Document

## 2010-06-29 NOTE — Medication Information (Signed)
Summary: Order for Diabetic Supplies  Order for Diabetic Supplies   Imported By: Maryln Gottron 06/07/2010 12:46:40  _____________________________________________________________________  External Attachment:    Type:   Image     Comment:   External Document

## 2010-07-01 ENCOUNTER — Other Ambulatory Visit: Payer: Self-pay | Admitting: Family Medicine

## 2010-07-03 NOTE — Telephone Encounter (Signed)
Call in one year supply  

## 2010-07-03 NOTE — Telephone Encounter (Signed)
Completed.

## 2010-07-04 ENCOUNTER — Encounter (HOSPITAL_COMMUNITY): Payer: Medicare Other | Attending: Pulmonary Disease

## 2010-07-04 ENCOUNTER — Other Ambulatory Visit: Payer: Self-pay

## 2010-07-04 DIAGNOSIS — J4489 Other specified chronic obstructive pulmonary disease: Secondary | ICD-10-CM | POA: Insufficient documentation

## 2010-07-04 DIAGNOSIS — J449 Chronic obstructive pulmonary disease, unspecified: Secondary | ICD-10-CM | POA: Insufficient documentation

## 2010-07-04 DIAGNOSIS — Z5189 Encounter for other specified aftercare: Secondary | ICD-10-CM | POA: Insufficient documentation

## 2010-07-05 LAB — GLUCOSE, CAPILLARY: Glucose-Capillary: 96 mg/dL (ref 70–99)

## 2010-07-06 ENCOUNTER — Encounter (HOSPITAL_COMMUNITY): Payer: Medicare Other

## 2010-07-08 ENCOUNTER — Other Ambulatory Visit: Payer: Self-pay | Admitting: Family Medicine

## 2010-07-10 NOTE — Telephone Encounter (Signed)
Call in #60 with no refills. She needs an OV soon  

## 2010-07-11 ENCOUNTER — Encounter (HOSPITAL_COMMUNITY): Payer: Medicare Other

## 2010-07-13 ENCOUNTER — Other Ambulatory Visit: Payer: Self-pay

## 2010-07-13 ENCOUNTER — Encounter (HOSPITAL_COMMUNITY): Payer: Medicare Other

## 2010-07-18 ENCOUNTER — Encounter (HOSPITAL_COMMUNITY): Payer: Medicare Other

## 2010-07-20 ENCOUNTER — Encounter (HOSPITAL_COMMUNITY): Payer: Medicare Other

## 2010-07-25 ENCOUNTER — Encounter (HOSPITAL_COMMUNITY): Payer: Medicare Other

## 2010-07-27 ENCOUNTER — Encounter (HOSPITAL_COMMUNITY): Payer: Medicare Other | Attending: Pulmonary Disease

## 2010-07-27 DIAGNOSIS — J449 Chronic obstructive pulmonary disease, unspecified: Secondary | ICD-10-CM | POA: Insufficient documentation

## 2010-07-27 DIAGNOSIS — Z5189 Encounter for other specified aftercare: Secondary | ICD-10-CM | POA: Insufficient documentation

## 2010-07-27 DIAGNOSIS — J4489 Other specified chronic obstructive pulmonary disease: Secondary | ICD-10-CM | POA: Insufficient documentation

## 2010-08-01 ENCOUNTER — Encounter (HOSPITAL_COMMUNITY): Payer: Medicare Other

## 2010-08-03 ENCOUNTER — Encounter (HOSPITAL_COMMUNITY): Payer: Medicare Other

## 2010-08-08 ENCOUNTER — Encounter (HOSPITAL_COMMUNITY): Payer: Medicare Other

## 2010-08-09 ENCOUNTER — Other Ambulatory Visit: Payer: Self-pay | Admitting: Family Medicine

## 2010-08-10 ENCOUNTER — Encounter (HOSPITAL_COMMUNITY): Payer: Medicare Other

## 2010-08-12 LAB — POCT I-STAT 3, VENOUS BLOOD GAS (G3P V)
Acid-Base Excess: 12 mmol/L — ABNORMAL HIGH (ref 0.0–2.0)
O2 Saturation: 51 %
TCO2: 43 mmol/L (ref 0–100)
pH, Ven: 7.382 — ABNORMAL HIGH (ref 7.250–7.300)

## 2010-08-14 LAB — BLOOD GAS, ARTERIAL
O2 Saturation: 90.8 %
Patient temperature: 98.6
TCO2: 30 mmol/L (ref 0–100)
pCO2 arterial: 50.6 mmHg — ABNORMAL HIGH (ref 35.0–45.0)
pH, Arterial: 7.447 — ABNORMAL HIGH (ref 7.350–7.400)
pO2, Arterial: 55 mmHg — ABNORMAL LOW (ref 80.0–100.0)

## 2010-08-15 ENCOUNTER — Encounter (HOSPITAL_COMMUNITY): Payer: Medicare Other

## 2010-08-17 ENCOUNTER — Encounter (HOSPITAL_COMMUNITY): Payer: Medicare Other

## 2010-08-22 ENCOUNTER — Encounter (HOSPITAL_COMMUNITY): Payer: Medicare Other

## 2010-08-24 ENCOUNTER — Encounter (HOSPITAL_COMMUNITY): Payer: Medicare Other

## 2010-08-29 ENCOUNTER — Encounter (HOSPITAL_COMMUNITY): Payer: Medicare Other | Attending: Pulmonary Disease

## 2010-08-29 DIAGNOSIS — J449 Chronic obstructive pulmonary disease, unspecified: Secondary | ICD-10-CM | POA: Insufficient documentation

## 2010-08-29 DIAGNOSIS — Z5189 Encounter for other specified aftercare: Secondary | ICD-10-CM | POA: Insufficient documentation

## 2010-08-29 DIAGNOSIS — J4489 Other specified chronic obstructive pulmonary disease: Secondary | ICD-10-CM | POA: Insufficient documentation

## 2010-08-31 ENCOUNTER — Encounter (HOSPITAL_COMMUNITY): Payer: Medicare Other

## 2010-09-03 ENCOUNTER — Other Ambulatory Visit: Payer: Self-pay | Admitting: Family Medicine

## 2010-09-05 ENCOUNTER — Encounter (HOSPITAL_COMMUNITY): Payer: Medicare Other

## 2010-09-07 ENCOUNTER — Encounter (HOSPITAL_COMMUNITY): Payer: Medicare Other

## 2010-09-11 ENCOUNTER — Ambulatory Visit (INDEPENDENT_AMBULATORY_CARE_PROVIDER_SITE_OTHER): Payer: Medicare Other | Admitting: Family Medicine

## 2010-09-11 ENCOUNTER — Encounter: Payer: Self-pay | Admitting: Family Medicine

## 2010-09-11 VITALS — BP 120/80 | HR 103 | Wt 208.0 lb

## 2010-09-11 DIAGNOSIS — F411 Generalized anxiety disorder: Secondary | ICD-10-CM

## 2010-09-11 DIAGNOSIS — F419 Anxiety disorder, unspecified: Secondary | ICD-10-CM

## 2010-09-11 DIAGNOSIS — I2789 Other specified pulmonary heart diseases: Secondary | ICD-10-CM

## 2010-09-11 DIAGNOSIS — I272 Pulmonary hypertension, unspecified: Secondary | ICD-10-CM

## 2010-09-11 DIAGNOSIS — I509 Heart failure, unspecified: Secondary | ICD-10-CM

## 2010-09-11 DIAGNOSIS — E119 Type 2 diabetes mellitus without complications: Secondary | ICD-10-CM

## 2010-09-11 LAB — CBC WITH DIFFERENTIAL/PLATELET
Basophils Absolute: 0 10*3/uL (ref 0.0–0.1)
Eosinophils Relative: 0.7 % (ref 0.0–5.0)
Monocytes Relative: 6.2 % (ref 3.0–12.0)
Neutrophils Relative %: 65.4 % (ref 43.0–77.0)
Platelets: 163 10*3/uL (ref 150.0–400.0)
WBC: 9.5 10*3/uL (ref 4.5–10.5)

## 2010-09-11 LAB — POCT URINALYSIS DIPSTICK
Glucose, UA: NEGATIVE
Leukocytes, UA: NEGATIVE
Nitrite, UA: NEGATIVE
Urobilinogen, UA: 0.2

## 2010-09-11 LAB — BASIC METABOLIC PANEL
CO2: 36 mEq/L — ABNORMAL HIGH (ref 19–32)
Calcium: 9.4 mg/dL (ref 8.4–10.5)
Creatinine, Ser: 0.8 mg/dL (ref 0.4–1.2)
Glucose, Bld: 102 mg/dL — ABNORMAL HIGH (ref 70–99)

## 2010-09-11 LAB — HEPATIC FUNCTION PANEL
Albumin: 4.1 g/dL (ref 3.5–5.2)
Bilirubin, Direct: 0.1 mg/dL (ref 0.0–0.3)
Total Protein: 7.2 g/dL (ref 6.0–8.3)

## 2010-09-11 LAB — TSH: TSH: 2.47 u[IU]/mL (ref 0.35–5.50)

## 2010-09-11 LAB — HEMOGLOBIN A1C: Hgb A1c MFr Bld: 7.3 % — ABNORMAL HIGH (ref 4.6–6.5)

## 2010-09-11 MED ORDER — METFORMIN HCL 1000 MG PO TABS: 1000.0000 mg | ORAL_TABLET | Freq: Two times a day (BID) | ORAL | Status: DC

## 2010-09-11 MED ORDER — GABAPENTIN 300 MG PO CAPS: 300.0000 mg | ORAL_CAPSULE | Freq: Three times a day (TID) | ORAL | Status: DC

## 2010-09-11 MED ORDER — SPIRONOLACTONE 25 MG PO TABS: 25.0000 mg | ORAL_TABLET | Freq: Every day | ORAL | Status: DC

## 2010-09-11 MED ORDER — HYDROCODONE-ACETAMINOPHEN 5-500 MG PO TABS: 1.0000 | ORAL_TABLET | Freq: Four times a day (QID) | ORAL | Status: DC | PRN

## 2010-09-11 MED ORDER — FUROSEMIDE 40 MG PO TABS: 80.0000 mg | ORAL_TABLET | Freq: Two times a day (BID) | ORAL | Status: DC

## 2010-09-11 MED ORDER — ALPRAZOLAM 0.5 MG PO TABS: 0.5000 mg | ORAL_TABLET | Freq: Three times a day (TID) | ORAL | Status: DC | PRN

## 2010-09-11 MED ORDER — GLIPIZIDE 10 MG PO TABS: 10.0000 mg | ORAL_TABLET | Freq: Two times a day (BID) | ORAL | Status: DC

## 2010-09-11 MED ORDER — ROSUVASTATIN CALCIUM 40 MG PO TABS: 40.0000 mg | ORAL_TABLET | Freq: Every day | ORAL | Status: DC

## 2010-09-11 MED ORDER — OMEPRAZOLE 40 MG PO CPDR: 40.0000 mg | DELAYED_RELEASE_CAPSULE | Freq: Every day | ORAL | Status: DC

## 2010-09-11 NOTE — Progress Notes (Signed)
  Subjective:    Patient ID: Jillian Bright, female    DOB: Mar 17, 1946, 65 y.o.   MRN: [id number]  HPI Here for med refills and labs. She is doing well in general. Wearing her oxygen at all times and attending pulmonary rehab twice a week. To see Dr. Craige Cotta tomorrow. She is not fasting today. Some SOB but no chest pains. Her glucoses run the 100s mostly.    Review of Systems  Constitutional: Negative.   Respiratory: Positive for shortness of breath. Negative for cough and wheezing.   Cardiovascular: Negative.        Objective:   Physical Exam  Constitutional: She appears well-developed and well-nourished.  Cardiovascular: Normal rate, regular rhythm, normal heart sounds and intact distal pulses.   Pulmonary/Chest: Effort normal and breath sounds normal.  Psychiatric: She has a normal mood and affect. Her behavior is normal. Judgment and thought content normal.          Assessment & Plan:  Get labs today including an A1c. Refilled her meds.

## 2010-09-12 ENCOUNTER — Encounter (HOSPITAL_COMMUNITY): Payer: Medicare Other

## 2010-09-12 ENCOUNTER — Encounter: Payer: Self-pay | Admitting: Pulmonary Disease

## 2010-09-12 ENCOUNTER — Ambulatory Visit (INDEPENDENT_AMBULATORY_CARE_PROVIDER_SITE_OTHER): Payer: Medicare Other | Admitting: Pulmonary Disease

## 2010-09-12 ENCOUNTER — Telehealth: Payer: Self-pay

## 2010-09-12 DIAGNOSIS — I2789 Other specified pulmonary heart diseases: Secondary | ICD-10-CM

## 2010-09-12 DIAGNOSIS — E662 Morbid (severe) obesity with alveolar hypoventilation: Secondary | ICD-10-CM

## 2010-09-12 DIAGNOSIS — J449 Chronic obstructive pulmonary disease, unspecified: Secondary | ICD-10-CM

## 2010-09-12 DIAGNOSIS — J4489 Other specified chronic obstructive pulmonary disease: Secondary | ICD-10-CM

## 2010-09-12 MED ORDER — BUDESONIDE-FORMOTEROL FUMARATE 160-4.5 MCG/ACT IN AERO: 2.0000 | INHALATION_SPRAY | Freq: Two times a day (BID) | RESPIRATORY_TRACT | Status: DC

## 2010-09-12 NOTE — Assessment & Plan Note (Signed)
This is relatively stable. She is to continue symbicort and as needed ventolin. She is to complete pulmonary rehab.

## 2010-09-12 NOTE — Assessment & Plan Note (Signed)
She has oxygen desaturation at rest on room air.  She is to continue with supplemental oxygen 24/7 at 3 liters.  I have written a letter for her to travel with her oxygen.

## 2010-09-12 NOTE — Progress Notes (Signed)
Subjective:    Patient ID: Jillian Bright, female    DOB: Dec 05, 1945, 65 y.o.   MRN: [id number]  HPI 65 yo female ex-smoker with mild COPD, OSA/OHS unable to tolerate CPAP, and secondary pulmonary hypertension.  Her breathing has been doing okay.  She uses albuterol 3 or 4 times per week.  She has not smoked.  She has occasional dry cough.  She will get sinus congestion and phlegm in the morning.  She thinks she is having allergies with the change of season.  She gets occasional wheeze.  She denies chest pain, and is not having much leg swelling.  She continues to use oxygen 24/7.  Past Medical History  Diagnosis Date  . Diabetes mellitus      No family history on file.   History   Social History  . Marital Status: Widowed    Spouse Name: N/A    Number of Children: N/A  . Years of Education: N/A   Occupational History  . Not on file.   Social History Main Topics  . Smoking status: Former Smoker -- 2.0 packs/day for 36 years    Types: Cigarettes    Quit date: 05/28/2009  . Smokeless tobacco: Not on file  . Alcohol Use: Not on file  . Drug Use: Not on file  . Sexually Active: Not on file   Other Topics Concern  . Not on file   Social History Narrative  . No narrative on file     Allergies  Allergen Reactions  . Codeine      Outpatient Prescriptions Prior to Visit  Medication Sig Dispense Refill  . albuterol (PROAIR HFA) 108 (90 BASE) MCG/ACT inhaler Inhale 2 puffs into the lungs every 6 (six) hours as needed.        . ALPRAZolam (XANAX) 0.5 MG tablet Take 1 tablet (0.5 mg total) by mouth 3 (three) times daily as needed for anxiety.  90 tablet  5  . furosemide (LASIX) 40 MG tablet Take 2 tablets (80 mg total) by mouth 2 (two) times daily.  120 tablet  11  . gabapentin (NEURONTIN) 300 MG capsule Take 1 capsule (300 mg total) by mouth 3 (three) times daily.  90 capsule  11  . glipiZIDE (GLUCOTROL) 10 MG tablet Take 1 tablet (10 mg total) by mouth 2 (two) times daily  before a meal.  60 tablet  11  . glucose blood (ACCU-CHEK AVIVA) test strip        . HYDROcodone-acetaminophen (VICODIN) 5-500 MG per tablet Take 1 tablet by mouth every 6 (six) hours as needed for pain.  60 tablet  5  . metFORMIN (GLUCOPHAGE) 1000 MG tablet Take 1 tablet (1,000 mg total) by mouth 2 (two) times daily.  60 tablet  11  . NON FORMULARY Oxygen 3L       . omeprazole (PRILOSEC) 40 MG capsule Take 1 capsule (40 mg total) by mouth daily.  30 capsule  11  . rosuvastatin (CRESTOR) 40 MG tablet Take 1 tablet (40 mg total) by mouth daily.  30 tablet  11  . spironolactone (ALDACTONE) 25 MG tablet Take 1 tablet (25 mg total) by mouth daily.  30 tablet  11       Review of Systems     Objective:   Physical Exam Filed Vitals:   09/12/10 1621 09/12/10 1622  BP:  100/58  Pulse:  96  Temp: 97.7 F (36.5 C)   TempSrc: Oral   Height: 5\' 6"  (1.676 m)  Weight: 213 lb (96.616 kg)   SpO2:  94%   General: obese, on supplemental oxygen.  Nose: no deformity, discharge, inflammation, or lesions  Mouth: MP 3, no oral lesions, wears dentures  Neck: no JVD.  Lungs: diminished breath sounds, no wheezing or rales  Heart: regular rhythm, normal rate, and no murmurs.  Extremities: minimal ankle edema  Neurologic: normal CN II-XII and strength normal.  Cervical Nodes: no significant adenopathy         Assessment & Plan:   PULMONARY HYPERTENSION This is secondary to her COPD, OSA, OHS, and diastolic dysfunction. Continue to optimize therapies for these. Explained to her that we may be somewhat limited in our ability to improve this without being able to treat her sleep apnea more aggressively.  COPD This is relatively stable. She is to continue symbicort and as needed ventolin. She is to complete pulmonary rehab.   Obesity hypoventilation syndrome She has oxygen desaturation at rest on room air.  She is to continue with supplemental oxygen 24/7 at 3 liters.  I have written a letter  for her to travel with her oxygen.    Updated Medication List Outpatient Encounter Prescriptions as of 09/12/2010  Medication Sig Dispense Refill  . albuterol (PROAIR HFA) 108 (90 BASE) MCG/ACT inhaler Inhale 2 puffs into the lungs every 6 (six) hours as needed.        . ALPRAZolam (XANAX) 0.5 MG tablet Take 1 tablet (0.5 mg total) by mouth 3 (three) times daily as needed for anxiety.  90 tablet  5  . furosemide (LASIX) 40 MG tablet Take 2 tablets (80 mg total) by mouth 2 (two) times daily.  120 tablet  11  . gabapentin (NEURONTIN) 300 MG capsule Take 1 capsule (300 mg total) by mouth 3 (three) times daily.  90 capsule  11  . glipiZIDE (GLUCOTROL) 10 MG tablet Take 1 tablet (10 mg total) by mouth 2 (two) times daily before a meal.  60 tablet  11  . glucose blood (ACCU-CHEK AVIVA) test strip        . HYDROcodone-acetaminophen (VICODIN) 5-500 MG per tablet Take 1 tablet by mouth every 6 (six) hours as needed for pain.  60 tablet  5  . metFORMIN (GLUCOPHAGE) 1000 MG tablet Take 1 tablet (1,000 mg total) by mouth 2 (two) times daily.  60 tablet  11  . NON FORMULARY Oxygen 3L       . omeprazole (PRILOSEC) 40 MG capsule Take 1 capsule (40 mg total) by mouth daily.  30 capsule  11  . rosuvastatin (CRESTOR) 40 MG tablet Take 1 tablet (40 mg total) by mouth daily.  30 tablet  11  . spironolactone (ALDACTONE) 25 MG tablet Take 1 tablet (25 mg total) by mouth daily.  30 tablet  11  . budesonide-formoterol (SYMBICORT) 160-4.5 MCG/ACT inhaler Inhale 2 puffs into the lungs 2 (two) times daily.  1 Inhaler  12

## 2010-09-12 NOTE — Telephone Encounter (Signed)
Pt aware.

## 2010-09-12 NOTE — Patient Instructions (Signed)
Follow up in 6 months 

## 2010-09-12 NOTE — Assessment & Plan Note (Signed)
This is secondary to her COPD, OSA, OHS, and diastolic dysfunction. Continue to optimize therapies for these. Explained to her that we may be somewhat limited in our ability to improve this without being able to treat her sleep apnea more aggressively.

## 2010-09-12 NOTE — Telephone Encounter (Signed)
Message copied by Madison Hickman on Tue Sep 12, 2010 11:11 AM ------      Message from: Dwaine Deter      Created: Tue Sep 12, 2010  6:11 AM       Normal except her diabetes needs to be better controlled. Stay on current meds, but watch a stricter diet

## 2010-09-14 ENCOUNTER — Encounter (HOSPITAL_COMMUNITY): Payer: Medicare Other

## 2010-09-19 ENCOUNTER — Encounter (HOSPITAL_COMMUNITY): Payer: Medicare Other

## 2010-09-21 ENCOUNTER — Encounter (HOSPITAL_COMMUNITY): Payer: Medicare Other

## 2010-09-26 ENCOUNTER — Encounter (HOSPITAL_COMMUNITY): Payer: Self-pay

## 2010-09-28 ENCOUNTER — Encounter (HOSPITAL_COMMUNITY): Payer: Self-pay

## 2010-10-01 ENCOUNTER — Other Ambulatory Visit: Payer: Self-pay | Admitting: Cardiology

## 2010-10-02 ENCOUNTER — Telehealth: Payer: Self-pay | Admitting: *Deleted

## 2010-10-02 NOTE — Telephone Encounter (Signed)
Pharmacy requesting refill for: metformin HCL 1000mg  #60x1 Pt was seen in office on 09/11/10 and record on file of Printed Rx given at OV #60x11  Please Advise.

## 2010-10-03 ENCOUNTER — Encounter (HOSPITAL_COMMUNITY): Payer: Self-pay

## 2010-10-03 MED ORDER — METFORMIN HCL 1000 MG PO TABS: 1000.0000 mg | ORAL_TABLET | Freq: Two times a day (BID) | ORAL | Status: DC

## 2010-10-03 NOTE — Telephone Encounter (Signed)
Call in #60 with 11 rf 

## 2010-10-03 NOTE — Telephone Encounter (Signed)
Done

## 2010-10-05 ENCOUNTER — Encounter (HOSPITAL_COMMUNITY): Payer: Self-pay

## 2010-10-10 ENCOUNTER — Encounter: Payer: Self-pay | Admitting: *Deleted

## 2010-10-10 ENCOUNTER — Telehealth: Payer: Self-pay | Admitting: Pulmonary Disease

## 2010-10-10 ENCOUNTER — Encounter (HOSPITAL_COMMUNITY): Payer: Self-pay

## 2010-10-10 NOTE — Assessment & Plan Note (Signed)
[facility]                            CARDIOLOGY OFFICE NOTE   NAME:Jillian Bright, Jillian Bright                      MRN:          [id number]  DATE:08/26/2007                            DOB:          1945/08/11    The primary is Tera Mater. Clent Ridges, MD   REASON FOR PRESENTATION:  Evaluate patient with shortness of breath,  coronary disease and lower extremity edema.   HISTORY OF PRESENT ILLNESS:  The patient is a 65 year old white female  with a past history of coronary disease and stenting in her right  coronary.  She has not been seen here since 2004.  She had a stress  perfusion study in 2004 demonstrating no ischemia.  The patient is  particularly taking care of herself.  She said she was caring for an  ailing mother over the last couple of years.  Her mother passed away  last year.  She did not really follow up aggressively with secondary  risk reduction.  She has had progressive lower extremity swelling.  This  has been her most noticeable problem.  This has been happening over a  year.  She had had some increased shortness of breath, though she is  vague about this.  She has had some coughing.  She did see Dr. Clent Ridges  recently and has had her Lasix taken from a chronic dose of 20 mg up to  40 and then most recently to 80 mg because of her lower extremity edema.  She did have a chest x-ray, which demonstrated cardiomegaly and some  mild vascular congestion as well as emphysema.  Said she has had  improvement of her lower extremity swelling.  She is not quite sure that  her breathing is any different.  Again, she does not describe a dramatic  change in her breathing but just an overall dyspnea with exertion.  She  has had progressive exercise intolerance, however.  She says she is  really unable to do as much as she was able to do a year ago.  She gets  fatigued with moderate activity.  She does work all day but at the end  of the day she finds it exhausting  just to  put one foot in front of  the other.  The patient is not describing substernal chest pressure  similar to her previous angina.  She does not have any neck or arm  discomfort.  However, she does get some fleeting discomfort that is  sporadic.  This is very hard for her to quantify.  It is not necessarily  with exertion.  It may happen at rest.  There are no associated  symptoms.   The patient overall feels very poorly with generalized fatigue being a  predominant complaint and lower extremity swelling being an objective  finding.  She, unfortunately, continues to smoke one pack of cigarettes  a day.  She was recently diagnosed with diabetes.   PAST MEDICAL HISTORY:  1. Coronary artery disease.  Myocardial infarction in 2002.  She had      95% right coronary artery stenosis, 70% distal  right coronary      artery stenosis.  She had stenting of both areas.  The left main      had 20% stenosis, the LAD had luminal irregularities and the      circumflex had 30% stenosis.  2. Hyperlipidemia since 2002.  3. Diabetes mellitus, recently diagnosed.  4. Ongoing tobacco abuse.   PAST SURGICAL HISTORY:  Back surgery, tubal ligation, appendectomy.   ALLERGIES:   CURRENT MEDICATIONS:  1. Furosemide 40 mg b.i.d.  2. Hydrocodone p.r.n. back pain  3. Simvastatin 80 mg daily.  4. Aleve.  5. Metoprolol 25 mg b.i.d.  6. Prilosec.  7. Zyrtec 10 mg daily.  8. Aspirin 325 mg daily.  9. Metformin 500 mg b.i.d.  10.Ambien 12.5 mg nightly.  11.Alprazolam 0.5 mg t.i.d.   SOCIAL HISTORY:  The patient is a Interior and spatial designer.  She is divorced.  She  does not have any children.  She has been smoking one pack per day for  30 years.  She had quit for a brief period after her heart attack but  resumed cigarettes.  She drinks alcohol socially.   FAMILY HISTORY:  Contributory for her father dying of a myocardial  infarction at age 81.  Her mother had heart disease at a later age.   REVIEW OF SYSTEMS:   As stated in the HPI and positive for occasional  dizziness with walking, fatigue, gastroesophageal reflux, joint pains.  Negative for other systems.   PHYSICAL EXAMINATION:  The patient is in no acute distress.  She does  seem somewhat depressed and exasperated by her chronic complaints.  Blood pressure 119/69, heart rate 90 and regular, body mass index 35.  HEENT:  Eyelids unremarkable.  Pupils equal, round and reactive to  light.  Fundi within normal limits.  Oral mucosa unremarkable.  NECK:  No jugular venous distention at 45 degrees.  Carotid upstroke  brisk and symmetrical.  No bruits.  No Thyromegaly.  LYMPHATICS:  No cervical, axillary or inguinal adenopathy.  LUNGS:  Decreased breath sounds without wheezing or crackles.  BACK:  No costovertebral angle tenderness.  CHEST:  Unremarkable.  HEART:  PMI not displaced or sustained, S1 and S2  within normal limits, no S3, no S4, no clicks, no rubs, a 2/6 apical  systolic murmur radiating out the aortic outflow tract with some  radiation into the carotids, no diastolic murmurs.  ABDOMEN:  Obese, positive bowel sounds, normal in frequency and pitch,  no bruits, no rebound, no guarding, no midline pulsatile mass, no  hepatomegaly, no splenomegaly.  SKIN:  No rashes, no nodules.  EXTREMITIES:  2+ pulses upper, 2+ femorals, 1+ dorsalis pedis and  posterior tibialis on the right, absent dorsalis pedis and posterior  tibialis on the left.  Mild bilateral lower extremity edema.  Varicosities in lower extremities.  Dependent rubor.  NEUROLOGIC:  Oriented to person, place and time.  Cranial nerves II-XII  grossly intact.  Motor grossly intact.   EKG:  Right bundle branch block, right axis deviation, intervals within  normal limits.  The right bundle branch block is new since her previous  EKG in 2004.   ASSESSMENT/PLAN:  1. Dyspnea and fatigue.  This may be an anginal equivalent in a      patient who has known coronary disease and  significant      cardiovascular risk factors.  She has not participated in secondary      risk reduction.  The pretest probability that these symptoms are  related to obstructive coronary artery disease is at least      moderately high.  She needs a stress test.  She would not be able      to walk on a treadmill, so she will have an adenosine Cardiolite.      She is not wheezing, so she ought to be able to tolerate this.  2. Murmur.  The patient has a heart murmur.  This may be aortic      sclerosis.  We will get an echocardiogram to evaluate this.  This      will also allow me to judge any pulmonary pressures that might be      contributing to her lower extremity swelling.  3. Edema as above.  Currently this is managed with the Lasix and she      will continue that.  4. Peripheral vascular disease.  The patient clearly has reduced      pulses.  On careful questioning, she has some pain in her hips with      standing or walking.  This could be claudication.  She will get      ABIs.  5. Risk reduction.  This patient needs some very aggressive lipid      control.  I would suggest an LDL of less than 70 and HDL greater      than 50.  I will keep in mind that some of her symptoms could be      related to the high dose of simvastatin.  It might be prudent, if      nothing else turns up as a cause for weakness and fatigue, to try a      different statin.  6. Tobacco.  I discussed with her the need to stop smoking (greater      than 3 minutes).  Dr. Clent Ridges has given her prescription for Chantix in      the past and I encouraged her to use this.  7. Diabetes.  This was recently diagnosed by Dr. Clent Ridges.  He will follow.  8. Follow-up.  I would like to see her back after the results of the      above to review and plan any further treatment or testing.     Rollene Rotunda, MD, [facility]  Electronically Signed    JH/MedQ  DD: 08/26/2007  DT: 08/27/2007  Job #: [id number]   cc:   Jeannett Senior A. Clent Ridges, MD

## 2010-10-10 NOTE — Telephone Encounter (Signed)
Spoke with pt and she states she needs the letter VS typed to be dated for 10-11-10 as well as the physician statement to be dated the 16th as well b/c it needs to be within 10 days of flight. Per VS ok to change the dates and he signed the forms. Pt request the form be mailed to her home. Carron Curie, CMA

## 2010-10-10 NOTE — Assessment & Plan Note (Signed)
[facility]                            CARDIOLOGY OFFICE NOTE   NAME:Jillian Bright, Jillian Bright                      MRN:          [id number]  DATE:09/24/2007                            DOB:          04-08-1946    PRIMARY CARE PHYSICIAN:  Tera Mater. Clent Ridges, M.D.   REASON FOR PRESENTATION:  Evaluate patient with shortness of breath and  lower extremity edema.   HISTORY OF PRESENT ILLNESS:  The patient is 65 years old.  She has  symptoms as described in the August 26, 2007 note.  Because of these, I  sent her for an adenosine perfusion study.  This demonstrated an EF of  72% and no evidence of ischemia or infarct.  She had lower extremity  Dopplers because of her leg pain.  These were normal.  She then had an  echocardiogram demonstrating an EF of 60-65%.  There is some mild aortic  valve thickening but no significant valvular abnormalities.   The patient still complains of dyspnea with exertion.  She is better  with the Lasix.  She has had less lower extremity swelling with the  Lasix, but still has significant hip, knee and also peripheral burning  discomfort.  She has had no chest pressure, neck or arm discomfort.  She  had no new palpitations.  No presyncope or syncope.  She says she is  wanting to quit smoking!  She actually now has a prescription for  Chantix and Sep 26, 2007 as a date to quit.   PAST MEDICAL HISTORY:  1. Coronary artery disease (myocardial infarction in 2002.  Ninety-      five percent right coronary artery stenosis, 70% distal right      coronary artery stenosis.  She had stenting to both areas.  The      left main had 20% stenosis, the LAD had luminal irregularities and      the circumflex 30% stenosis).  2. Hyperlipidemia.  3. Diabetes mellitus.  4. Ongoing tobacco use.  5. Back surgery.  6. Tubal ligation.  7. Appendectomy.   ALLERGIES:  CODEINE.   MEDICATIONS:  1. Furosemide 40 mg b.i.d.  2. Simvastatin 80 mg daily.  3.  Metoprolol 25 mg b.i.d.  4. Prilosec.  5. Zyrtec.  6. Metformin 500 mg b.i.d.  7. Ambien 12.5 mg daily.  8. Alprazolam.   REVIEW OF SYSTEMS:  As stated in the HPI and otherwise negative for  other systems.   PHYSICAL EXAMINATION:  GENERAL:  The patient is in no distress.  Blood  pressure 108/60, heart rate 95 and regular, weight 216 pounds, body mass  index 35.  HEENT:  Eyelids unremarkable.  Pupils equal, round, reactive to light.  Fundi not visualized.  NECK:  No jugular distention at 45 degrees.  Carotid upstrokes brisk and  symmetric.  No bruits, no thyromegaly.  LYMPHATICS:  No cervical, axillary, inguinal adenopathy.  LUNGS:  Clear to auscultation bilaterally.  BACK:  No costovertebral tenderness.  CHEST:  Unremarkable.  HEART:  PMI not displaced or sustained.  S1-S2 within normal limits.  No  S3, no S4,  no clicks, no rubs.  A 2/6 apical systolic murmur radiating  at the aortic outflow tract and into the carotids.  No diastolic  murmurs.  ABDOMEN:  Obese, positive bowel sounds normal in frequency and pitch.  No bruits, no rebound, no guarding.  No midline pulsatile mass, no  organomegaly.  SKIN:  No rashes, no nodules.  EXTREMITIES:  2+ upper pulses, 2+ femorals, 1+ dorsalis pedis and  posterior tibialis bilaterally.  Mild bilateral lower extremity edema.  Chronic venous stasis changes.  NEUROLOGIC:  Grossly intact.   ASSESSMENT/PLAN:  1. Dyspnea.  The patient's dyspnea does not seem to have a cardiac      etiology.  I cannot exclude some component of diastolic      dysfunction, so we continued the diuretic and would suggest salt      restriction.  However, no further cardiovascular testing is      suggested.  I would suggest that she get pulmonary function testing      and see a pulmonologist.  She will discuss this with Dr. Clent Ridges.  She      does not want to do this now.  2. Tobacco.  I am excited that she wants to quit smoking and encourage      this.  Dr. Clent Ridges will  help her in any way possible.  3. Lower extremity pain.  The patient may have arthritis.  She also      may well have a neuropathy.  I have asked her to discuss this with      Dr. Clent Ridges, and whether he would empirically treat this or send her to      a neurologist.  4. Risk reduction.  The patient needs a lipid level with an LDL less      than 70 and HDL greater than 50.  I will defer to Dr. Clent Ridges.  Of      note, if she has no improvement in the future with treatment of      neuropathy or is thought not to have neuropathy, we might      discontinue the simvastatin to see if her lower extremity      complaints are related to this.  5. Follow up.  She will come back in 1 year.     Rollene Rotunda, MD, [facility]  Electronically Signed    JH/MedQ  DD: 09/24/2007  DT: 09/24/2007  Job #: [id number]   cc:   Jeannett Senior A. Clent Ridges, MD

## 2010-10-12 ENCOUNTER — Encounter (HOSPITAL_COMMUNITY): Payer: Self-pay

## 2010-10-13 NOTE — Discharge Summary (Signed)
NAME:  Jillian Bright, Jillian Bright                         ACCOUNT NO.:  [id number]   MEDICAL RECORD NO.:  [id number]                   PATIENT TYPE:  INP   LOCATION:  2004                                 FACILITY:  [facility]   PHYSICIAN:  Stacie Glaze, M.D. [facility]           DATE OF BIRTH:  1945/11/25   DATE OF ADMISSION:  07/03/2003  DATE OF DISCHARGE:  07/04/2003                                 DISCHARGE SUMMARY   ADMISSION DIAGNOSES:  1. Chest pain.  2. Intractable nausea and vomiting.  3. Dehydration.   DISCHARGE DIAGNOSES:  1. Gastroenteritis.  2. Probably Mallory-Weiss tear.  3. Gastrointestinal chest pain.   HOSPITAL COURSE:  The patient is a 65 year old white female who was admitted  through the emergency room after presenting with intractable nausea and  vomiting at home and demonstratable nausea and vomiting in the emergency  room x 3 that was heme positive.   Her laboratory evaluation was unremarkable, but the heme positivity  warranted 24-hour observation for probable Mallory-Weiss tear.   She was admitted to the general medicine service and placed on telemetry  with H&Hs q.8h.   Her hemoglobin and hematocrit were stable throughout her 24-hour admission  and she had no further nausea and vomiting and no reported chest pain.  Telemetry was unremarkable.   The presumptive diagnosis is viral gastroenteritis, now resolved, with  probable Mallory-Weiss tear from violent nausea and vomiting.  She will be  discharged to home in stable condition with followup with Dr. Clent Ridges within one  week.  She was placed on Carafate 1 g slurry p.o. q.i.d. for two weeks and  Prilosec over the counter 20 mg p.o. daily for two weeks.  She is to follow  up with Jeannett Senior A. Clent Ridges, M.D., for consideration for EGD followup as an  outpatient.   The patient relays a history of cardiovascular disease, having had a PTCA  and stent in 2002.  She had a negative Cardiolite in 2004.   Cardiac markers were negative  during this admission and telemetry was also  negative.   The patient is discharged home in stable condition.  Followup and  medications were discussed with the patient.  The patient demonstrated  understanding.                                               Stacie Glaze, M.D. [facility]   JEJ/MEDQ  D:  07/04/2003  T:  07/04/2003  Job:  [id number]

## 2010-10-13 NOTE — Op Note (Signed)
[facility]  Patient:    Jillian Bright, Jillian Bright                     MRN: [id number] Proc. Date: 11/13/00 Attending:  Daisey Must, M.D. [facility] CC:         Noralyn Pick. Eden Emms, M.D. [facility]  Cardiac Catheterization Laboratory  [facility]   Operative Report  PROCEDURES PERFORMED: 1. Left heart catheterization with coronary angiography and left    ventriculography. 2. Percutaneous transluminal coronary angioplasty with stent placement    in the mid right coronary artery.  INDICATIONS:  The patient is a 65 year old woman, who presented to the emergency room with acute onset of severe chest pain.  ECG was consistent with an acute inferior wall myocardial infarction.  She was treated with aspirin and heparin in the emergency room.  She had resolution of pain with resolution of ST segment elevation.  However, she developed hypotension and bradycardia requiring institution of dopamine.  She is brought emergently to the cardiac catheterization laboratory.  CATHETERIZATION PROCEDURE:  A 6 French sheath was placed in the right femoral vein, 7 French sheath in the right femoral artery.  Standard Judkins 6 French catheters were utilized.  Contrast was Omnipaque.  There were no complications.  RESULTS:  HEMODYNAMICS:  Left ventricular pressure 156/28.  Aortic pressure 160/88. There was no aortic valve gradient.  LEFT VENTRICULOGRAM:  There is mild hypokinesis of the inferior wall. Ejection fraction is calculated at 62%, trace mitral regurgitation.  CORONARY ARTERIOGRAPHY:  (Right dominant).  Left main:  Left main has a distal 20% stenosis.  Left anterior descending:  The left anterior descending artery has minor luminal irregularities in the mid vessel.  The LAD gives rise to a normal sized first diagonal branch.  Left circumflex:  The left circumflex has a tubular 30% stenosis at its origin.  It gives rise to a single large obtuse marginal  branch with three sub-branches.  Right coronary artery:  The right coronary artery is a dominant vessel.  In the proximal vessel are tandem 30% stenosis.  In the mid vessel is a 95% stenosis just proximal to the right ventricular branch.  There is some haziness and irregularity associated with this lesion but TIMI-3 flow beyond it. Further down in the mid vessel at the acute angle is a 70% stenosis. The distal vessel has a diffuse 25% stenosis.  The right coronary artery gives rise to a normal sized posterior descending artery and normal sized first posterolateral branch and a small second posterolateral branch.  IMPRESSIONS: 1. Preserved left ventricular systolic function with only mild inferior    wall hypokinesis. 2. One-vessel coronary artery disease as described.  The culprit lesion    appears to be the 95% stenosis in the mid right coronary artery.  PLAN:  Percutaneous intervention of the right coronary artery, see below.  PERCUTANEOUS TRANSLUMINAL CORONARY ANGIOPLASTY PROCEDURE:  Following completion of the diagnostic catheterization, we proceeded directly with coronary intervention.  Heparin and ReoPro were administered per protocol.  We used a 7 [place] guiding catheter with side holes and a BMW wire.  A 95% stenosis in the mid right coronary artery was pre-dilated with a 2.5 x 15 mm Quantum balloon inflated to 12 atmospheres.  We then deployed a 2.75 x 33 mm Bx Velocity stent extending across the 95% followed by the 70% stenosis in the mid RCA.  This stent was deployed at 15 atmospheres.  The stent was then  post-dilated with a 3.0 x 20 mm Quantum balloon inflated to 14 atmospheres in the distal aspect of the stent, 18 atmospheres in the proximal end of the stent.  Final angiographic images revealed patency of the right coronary artery with less than 10% residual stenosis and TIMI-3 flow.  COMPLICATIONS:  None.  RESULTS:  Successful percutaneous transluminal coronary  angioplasty with stent placement in the mid right coronary artery.  A 95% stenosis followed by a 70% stenosis was reduced to less than 10% residual with TIMI-3 flow.  PLAN:  ReoPro will be continued for 12 hours.  Plavix will be administered for four weeks.  The patient needs aggressive risk factor modification. DD:  11/13/00 TD:  11/13/00 Job: [id number]

## 2011-01-31 ENCOUNTER — Other Ambulatory Visit: Payer: Self-pay | Admitting: Family Medicine

## 2011-02-20 ENCOUNTER — Other Ambulatory Visit: Payer: Self-pay | Admitting: Cardiology

## 2011-04-03 ENCOUNTER — Ambulatory Visit (INDEPENDENT_AMBULATORY_CARE_PROVIDER_SITE_OTHER)
Admission: RE | Admit: 2011-04-03 | Discharge: 2011-04-03 | Disposition: A | Discharge status: Home / Self Care | Payer: Medicare Other | Source: Ambulatory Visit | Attending: Family Medicine | Admitting: Family Medicine

## 2011-04-03 ENCOUNTER — Telehealth: Payer: Self-pay | Admitting: Family Medicine

## 2011-04-03 ENCOUNTER — Encounter: Payer: Self-pay | Admitting: Family Medicine

## 2011-04-03 ENCOUNTER — Ambulatory Visit (INDEPENDENT_AMBULATORY_CARE_PROVIDER_SITE_OTHER): Payer: Medicare Other | Admitting: Family Medicine

## 2011-04-03 ENCOUNTER — Other Ambulatory Visit: Payer: Self-pay | Admitting: Family Medicine

## 2011-04-03 VITALS — BP 116/70 | HR 92 | Temp 98.1°F | Wt 216.0 lb

## 2011-04-03 DIAGNOSIS — M546 Pain in thoracic spine: Secondary | ICD-10-CM

## 2011-04-03 DIAGNOSIS — E119 Type 2 diabetes mellitus without complications: Secondary | ICD-10-CM

## 2011-04-03 DIAGNOSIS — R0781 Pleurodynia: Secondary | ICD-10-CM

## 2011-04-03 DIAGNOSIS — R079 Chest pain, unspecified: Secondary | ICD-10-CM

## 2011-04-03 DIAGNOSIS — Z23 Encounter for immunization: Secondary | ICD-10-CM

## 2011-04-03 DIAGNOSIS — M545 Low back pain: Secondary | ICD-10-CM

## 2011-04-03 DIAGNOSIS — R32 Unspecified urinary incontinence: Secondary | ICD-10-CM

## 2011-04-03 LAB — HEMOGLOBIN A1C: Hgb A1c MFr Bld: 7.6 % — ABNORMAL HIGH (ref 4.6–6.5)

## 2011-04-03 MED ORDER — HYDROCODONE-ACETAMINOPHEN 10-660 MG PO TABS: 1.0000 | ORAL_TABLET | Freq: Four times a day (QID) | ORAL | Status: DC | PRN

## 2011-04-03 NOTE — Telephone Encounter (Signed)
Pt called to get xray results. Pls call asap. Pt said that 12 xray were taken.

## 2011-04-03 NOTE — Progress Notes (Signed)
Addended by: Gershon Crane A on: 04/03/2011 10:37 AM   Modules accepted: Orders

## 2011-04-03 NOTE — Progress Notes (Signed)
  Subjective:    Patient ID: Jillian Bright, female    DOB: 1946-03-29, 65 y.o.   MRN: [id number]  HPI Here asking about various pain syndromes. She has diabetic neuropathy, and she is still on Gabapentin. She takes Vicodin at times, but this no longer works very well. She also describes a sharp pain that started 2 months ago in the left middle back and in the left sided ribs. This hurts when she moves or bends over or takes a deep breath. No cough or SOB. No hx of trauma. She does not check her glucoses at home. She would like to see a Urologist for her urinary incontinence.    Review of Systems  Constitutional: Negative.   Respiratory: Negative.   Cardiovascular: Negative.   Musculoskeletal: Positive for back pain.       Objective:   Physical Exam  Constitutional: She appears well-developed and well-nourished.  Neck: No thyromegaly present.  Cardiovascular: Normal rate, regular rhythm, normal heart sounds and intact distal pulses.   Pulmonary/Chest: Effort normal and breath sounds normal.  Musculoskeletal:       Tender in the middle left back and around the left ribs  Lymphadenopathy:    She has no cervical adenopathy.          Assessment & Plan:  Increase the dose of Vicodin. Get Xrays today of the spine and ribs. Get an A1c today. Refer to Urology.

## 2011-04-03 NOTE — Progress Notes (Signed)
Addended by: Aniceto Boss A on: 04/03/2011 01:00 PM   Modules accepted: Orders

## 2011-04-04 NOTE — Telephone Encounter (Signed)
Pt called again to check the status of xray results from yesterday. Call her cell number. Thanks.

## 2011-04-05 ENCOUNTER — Telehealth: Payer: Self-pay

## 2011-04-05 DIAGNOSIS — M545 Low back pain: Secondary | ICD-10-CM

## 2011-04-05 NOTE — Progress Notes (Signed)
Quick Note:  Pt aware ______ 

## 2011-04-05 NOTE — Telephone Encounter (Signed)
Pt was given lab results and states she is still hurting on a daily basis; pt states when she bends over she has a sharpe stabbing pain that takes her breathe away.  Pt states this happens when she turns a certain way or bends over.  Pt would

## 2011-04-05 NOTE — Telephone Encounter (Signed)
No fractures were seen . See the report

## 2011-04-05 NOTE — Telephone Encounter (Signed)
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 Pt would like to know if she needs to have a follow up visit.

## 2011-04-06 NOTE — Telephone Encounter (Signed)
Pt aware.

## 2011-04-06 NOTE — Telephone Encounter (Signed)
I have referred her to a pain specialist, Dr. Ethelene Hal. Camelia Eng will be giving her a call about the appt

## 2011-04-09 ENCOUNTER — Telehealth: Payer: Self-pay | Admitting: Family Medicine

## 2011-04-09 NOTE — Telephone Encounter (Signed)
Refill request for Alprazolam 0.5 mg take 1 po tid prn and pt last here on 04/03/11.

## 2011-04-10 NOTE — Telephone Encounter (Signed)
Call in a 6 month supply  

## 2011-04-11 MED ORDER — ALPRAZOLAM 0.5 MG PO TABS: 0.5000 mg | ORAL_TABLET | Freq: Three times a day (TID) | ORAL | Status: DC | PRN

## 2011-04-11 NOTE — Telephone Encounter (Signed)
Script was phoned in.

## 2011-05-06 ENCOUNTER — Other Ambulatory Visit: Payer: Self-pay | Admitting: Family Medicine

## 2011-06-10 ENCOUNTER — Other Ambulatory Visit: Payer: Self-pay | Admitting: Family Medicine

## 2011-06-15 ENCOUNTER — Ambulatory Visit (INDEPENDENT_AMBULATORY_CARE_PROVIDER_SITE_OTHER): Payer: Medicare Other | Admitting: Family Medicine

## 2011-06-15 ENCOUNTER — Encounter: Payer: Self-pay | Admitting: Family Medicine

## 2011-06-15 VITALS — BP 118/70 | HR 102 | Temp 98.4°F | Wt 206.0 lb

## 2011-06-15 DIAGNOSIS — J441 Chronic obstructive pulmonary disease with (acute) exacerbation: Secondary | ICD-10-CM

## 2011-06-15 DIAGNOSIS — J40 Bronchitis, not specified as acute or chronic: Secondary | ICD-10-CM

## 2011-06-15 MED ORDER — METHYLPREDNISOLONE ACETATE 80 MG/ML IJ SUSP
120.0000 mg | Freq: Once | INTRAMUSCULAR | Status: AC
  Administered 2011-06-15: 120 mg via INTRAMUSCULAR

## 2011-06-15 MED ORDER — IPRATROPIUM-ALBUTEROL 0.5-2.5 (3) MG/3ML IN SOLN
3.0000 mL | Freq: Once | RESPIRATORY_TRACT | Status: AC
  Administered 2011-06-15: 3 mL via RESPIRATORY_TRACT

## 2011-06-15 MED ORDER — HYDROCODONE-HOMATROPINE 5-1.5 MG/5ML PO SYRP: 5.0000 mL | ORAL_SOLUTION | ORAL | Status: DC | PRN

## 2011-06-15 MED ORDER — AZITHROMYCIN 250 MG PO TABS: ORAL_TABLET | ORAL | Status: AC

## 2011-06-15 NOTE — Progress Notes (Signed)
  Subjective:    Patient ID: Jillian Bright, female    DOB: Aug 29, 1945, 66 y.o.   MRN: [id number]  HPI Here for 2 weeks of coughing up green sputum, SOB, and nausea. No fever. On Mucinex.    Review of Systems  Constitutional: Negative.   HENT: Negative.   Eyes: Negative.   Respiratory: Positive for cough, chest tightness, shortness of breath and wheezing.   Cardiovascular: Negative.        Objective:   Physical Exam  Constitutional: She appears well-developed and well-nourished.  HENT:  Head: Normocephalic.  Right Ear: External ear normal.  Left Ear: External ear normal.  Nose: Nose normal.  Mouth/Throat: Oropharynx is clear and moist. No oropharyngeal exudate.  Eyes: Conjunctivae are normal.  Neck: Neck supple. No thyromegaly present.  Pulmonary/Chest: Effort normal. She has no rales.       Scattered rhonchi and wheezes  Lymphadenopathy:    She has no cervical adenopathy.          Assessment & Plan:  Recheck prn.

## 2011-06-15 NOTE — Progress Notes (Signed)
Addended by: Aniceto Boss A on: 06/15/2011 02:33 PM   Modules accepted: Orders

## 2011-06-22 ENCOUNTER — Telehealth: Payer: Self-pay | Admitting: Family Medicine

## 2011-06-22 MED ORDER — HYDROCODONE-HOMATROPINE 5-1.5 MG/5ML PO SYRP: 5.0000 mL | ORAL_SOLUTION | ORAL | Status: AC | PRN

## 2011-06-22 NOTE — Telephone Encounter (Signed)
Call in another 240 ml bottle  

## 2011-06-22 NOTE — Telephone Encounter (Signed)
Pt called and says that she still has a little cough. Pt is req a refill of HYDROcodone-homatropine (HYDROMET) 5-1.5 MG/5ML syrup to be called in to [facility] on [address].

## 2011-06-22 NOTE — Telephone Encounter (Signed)
Script called in

## 2011-07-20 ENCOUNTER — Encounter: Payer: Self-pay | Admitting: Family Medicine

## 2011-07-20 ENCOUNTER — Ambulatory Visit (INDEPENDENT_AMBULATORY_CARE_PROVIDER_SITE_OTHER): Payer: Medicare Other | Admitting: Family Medicine

## 2011-07-20 VITALS — BP 102/60 | HR 103 | Temp 98.1°F | Wt 208.0 lb

## 2011-07-20 DIAGNOSIS — J40 Bronchitis, not specified as acute or chronic: Secondary | ICD-10-CM

## 2011-07-20 MED ORDER — AMOXICILLIN-POT CLAVULANATE 875-125 MG PO TABS: 1.0000 | ORAL_TABLET | Freq: Two times a day (BID) | ORAL | Status: DC

## 2011-07-20 MED ORDER — HYDROCODONE-HOMATROPINE 5-1.5 MG/5ML PO SYRP: 5.0000 mL | ORAL_SOLUTION | ORAL | Status: AC | PRN

## 2011-07-20 MED ORDER — AMOXICILLIN-POT CLAVULANATE 875-125 MG PO TABS: 1.0000 | ORAL_TABLET | Freq: Two times a day (BID) | ORAL | Status: AC

## 2011-07-20 MED ORDER — NEOMYCIN-POLYMYXIN-HC OP SUSP: 2.0000 [drp] | Freq: Four times a day (QID) | OPHTHALMIC | Status: DC

## 2011-07-20 NOTE — Progress Notes (Signed)
  Subjective:    Patient ID: Jillian Bright, female    DOB: Feb 16, 1946, 66 y.o.   MRN: [id number]  HPI Here for continued chest congestion and coughing up green sputum. No fever. She took a Zpack last month, and she seemed to get a little better. Then the symptoms returned this week like before. Now she also has redness and burning in both eyes.    Review of Systems  Constitutional: Negative.   HENT: Positive for congestion, postnasal drip and sinus pressure.   Eyes: Positive for discharge, redness and itching.  Respiratory: Positive for cough, shortness of breath and wheezing.        Objective:   Physical Exam  Constitutional: She appears well-developed and well-nourished.       Coughing a lot. Wearing her usual [place] oxygen at 3 liters   HENT:  Right Ear: External ear normal.  Left Ear: External ear normal.  Nose: Nose normal.  Mouth/Throat: Oropharynx is clear and moist. No oropharyngeal exudate.  Eyes: Right eye exhibits no discharge. Left eye exhibits no discharge.       Both conjunctivae are pink   Neck: Neck supple.  Pulmonary/Chest: She has no rales.       Scattered wheezes and rhonchi   Lymphadenopathy:    She has no cervical adenopathy.          Assessment & Plan:  Partially treated bronchitis and now with conjunctivitis.

## 2011-08-09 ENCOUNTER — Telehealth: Payer: Self-pay | Admitting: Family Medicine

## 2011-08-09 MED ORDER — HYDROCODONE-HOMATROPINE 5-1.5 MG/5ML PO SYRP: 5.0000 mL | ORAL_SOLUTION | Freq: Three times a day (TID) | ORAL | Status: AC | PRN

## 2011-08-09 NOTE — Telephone Encounter (Signed)
Script called in

## 2011-08-09 NOTE — Telephone Encounter (Signed)
Pt  Called and is req refill of HYDROcodone-homatropine (HYDROMET) 5-1.5 MG/5ML syrup to be called in today to [facility] on [address]. Pt req that this med be called in today asap, because pt is leaving to go out of town tomorrow at 12 noon.

## 2011-08-09 NOTE — Telephone Encounter (Signed)
Call in another 240 ml bottle  

## 2011-09-03 ENCOUNTER — Telehealth: Payer: Self-pay | Admitting: Family Medicine

## 2011-09-03 NOTE — Telephone Encounter (Signed)
Pulled from Triage vmail. Pharmacy states patient is on BOTH Crestor & Simvastatin. Crestor last filled 4/5 - Simvastatin last filled 4/1. Pt and pharmacy are confused. Please call [facility]. Thanks.

## 2011-09-05 NOTE — Telephone Encounter (Signed)
Left voice message for pt to return my call. I did leave the below information and I did speak with pharmacy.

## 2011-09-05 NOTE — Telephone Encounter (Signed)
She should be on Crestor 40 mg a day, not Simvastatin

## 2011-09-07 NOTE — Telephone Encounter (Signed)
Pt did call back and leave a message. She is taking Crestor and has stopped the Simvastatin.

## 2011-09-07 NOTE — Telephone Encounter (Signed)
Left another voice message for pt to return my call.  

## 2011-09-29 ENCOUNTER — Other Ambulatory Visit: Payer: Self-pay | Admitting: Family Medicine

## 2011-10-05 ENCOUNTER — Telehealth: Payer: Self-pay | Admitting: Family Medicine

## 2011-10-05 MED ORDER — HYDROCODONE-HOMATROPINE 5-1.5 MG/5ML PO SYRP: 5.0000 mL | ORAL_SOLUTION | ORAL | Status: AC | PRN

## 2011-10-05 NOTE — Telephone Encounter (Signed)
Call in a 240 ml bottle  

## 2011-10-05 NOTE — Telephone Encounter (Signed)
Refill request for Hydrocodone/Homatropine syrup take 5 mls q 8hrs prn and pt last here on 07/23/11.

## 2011-10-05 NOTE — Telephone Encounter (Signed)
I called in script 

## 2011-10-08 ENCOUNTER — Other Ambulatory Visit: Payer: Self-pay | Admitting: Family Medicine

## 2011-10-29 ENCOUNTER — Ambulatory Visit (INDEPENDENT_AMBULATORY_CARE_PROVIDER_SITE_OTHER): Payer: Medicare Other | Admitting: Family Medicine

## 2011-10-29 ENCOUNTER — Encounter: Payer: Self-pay | Admitting: Family Medicine

## 2011-10-29 VITALS — BP 102/64 | HR 109 | Temp 98.2°F | Wt 208.0 lb

## 2011-10-29 DIAGNOSIS — R600 Localized edema: Secondary | ICD-10-CM

## 2011-10-29 DIAGNOSIS — R0602 Shortness of breath: Secondary | ICD-10-CM

## 2011-10-29 DIAGNOSIS — I509 Heart failure, unspecified: Secondary | ICD-10-CM

## 2011-10-29 DIAGNOSIS — L0291 Cutaneous abscess, unspecified: Secondary | ICD-10-CM

## 2011-10-29 DIAGNOSIS — R609 Edema, unspecified: Secondary | ICD-10-CM

## 2011-10-29 DIAGNOSIS — L0390 Cellulitis, unspecified: Secondary | ICD-10-CM

## 2011-10-29 LAB — CBC WITH DIFFERENTIAL/PLATELET
Basophils Relative: 0.4 % (ref 0.0–3.0)
Eosinophils Relative: 0.1 % (ref 0.0–5.0)
HCT: 45.5 % (ref 36.0–46.0)
Hemoglobin: 13.9 g/dL (ref 12.0–15.0)
Lymphs Abs: 1.4 10*3/uL (ref 0.7–4.0)
MCV: 75.9 fl — ABNORMAL LOW (ref 78.0–100.0)
Monocytes Absolute: 0.5 10*3/uL (ref 0.1–1.0)
Monocytes Relative: 7.3 % (ref 3.0–12.0)
RBC: 5.99 Mil/uL — ABNORMAL HIGH (ref 3.87–5.11)
WBC: 7.2 10*3/uL (ref 4.5–10.5)

## 2011-10-29 LAB — BASIC METABOLIC PANEL
Calcium: 9.2 mg/dL (ref 8.4–10.5)
Chloride: 93 mEq/L — ABNORMAL LOW (ref 96–112)
Creatinine, Ser: 0.7 mg/dL (ref 0.4–1.2)
GFR: 90.04 mL/min (ref >60.00)

## 2011-10-29 LAB — TSH: TSH: 1.99 u[IU]/mL (ref 0.35–5.50)

## 2011-10-29 LAB — HEPATIC FUNCTION PANEL
Bilirubin, Direct: 0.1 mg/dL (ref 0.0–0.3)
Total Bilirubin: 0.6 mg/dL (ref 0.3–1.2)

## 2011-10-29 MED ORDER — FUROSEMIDE 40 MG PO TABS: 80.0000 mg | ORAL_TABLET | Freq: Two times a day (BID) | ORAL | Status: DC

## 2011-10-29 MED ORDER — POTASSIUM CHLORIDE CRYS ER 20 MEQ PO TBCR: 20.0000 meq | EXTENDED_RELEASE_TABLET | Freq: Two times a day (BID) | ORAL | Status: DC

## 2011-10-29 MED ORDER — CEPHALEXIN 500 MG PO CAPS: 500.0000 mg | ORAL_CAPSULE | Freq: Three times a day (TID) | ORAL | Status: DC

## 2011-10-29 MED ORDER — GABAPENTIN 300 MG PO CAPS: 300.0000 mg | ORAL_CAPSULE | Freq: Two times a day (BID) | ORAL | Status: DC

## 2011-10-29 NOTE — Progress Notes (Signed)
  Subjective:    Patient ID: Jillian Bright, female    DOB: [birth year], 66 y.o.   MRN: [id number]  HPI Here for 2 weeks of swelling and pain in the feet and lower legs. No change in her breathing, no more SOB than normal. No chest pains. No fever. She has not seen Dr. Shirlee Latch or Dr. Craige Cotta in almost 2 years. Her last ECHO on 04-27-10 showed an EF of 60-65% with some diastolic relaxation dysfunction and mild aortic stenosis.   Review of Systems  Constitutional: Positive for fatigue. Negative for unexpected weight change.  Respiratory: Negative.   Cardiovascular: Positive for leg swelling. Negative for chest pain and palpitations.       Objective:   Physical Exam  Constitutional: She appears well-developed and well-nourished. No distress.  Neck: No thyromegaly present.  Cardiovascular: Normal rate, regular rhythm and intact distal pulses.  Exam reveals no gallop and no friction rub.        2/6 SM over the aortic area   Pulmonary/Chest: Effort normal and breath sounds normal. No respiratory distress. She has no wheezes. She has no rales.  Musculoskeletal:       3+ edema in the right lower leg, 2+ edema in the left lower leg. Both are warm, pink, and tender  Lymphadenopathy:    She has no cervical adenopathy.          Assessment & Plan:  She has some early cellulitis in the legs so we will start on Keflex. It is unclear why she is having more edema lately. Get labs including a BMET and BNP. Increase lasix to 80 mg bid and add KCl.

## 2011-10-31 NOTE — Progress Notes (Signed)
Quick Note:  I spoke with pt ______ 

## 2011-11-05 ENCOUNTER — Encounter: Payer: Self-pay | Admitting: Family Medicine

## 2011-11-05 ENCOUNTER — Ambulatory Visit (INDEPENDENT_AMBULATORY_CARE_PROVIDER_SITE_OTHER): Payer: Medicare Other | Admitting: Family Medicine

## 2011-11-05 VITALS — BP 108/66 | HR 110 | Temp 98.3°F | Wt 208.0 lb

## 2011-11-05 DIAGNOSIS — I509 Heart failure, unspecified: Secondary | ICD-10-CM

## 2011-11-05 DIAGNOSIS — L0390 Cellulitis, unspecified: Secondary | ICD-10-CM

## 2011-11-05 DIAGNOSIS — L0291 Cutaneous abscess, unspecified: Secondary | ICD-10-CM

## 2011-11-05 MED ORDER — GLUCOSE BLOOD VI STRP: 1.0000 | ORAL_STRIP | Freq: Two times a day (BID) | Status: DC

## 2011-11-05 MED ORDER — CLINDAMYCIN HCL 300 MG PO CAPS: 300.0000 mg | ORAL_CAPSULE | Freq: Three times a day (TID) | ORAL | Status: AC

## 2011-11-05 NOTE — Progress Notes (Signed)
  Subjective:    Patient ID: Jillian Bright, female    DOB: 03-07-1946, 66 y.o.   MRN: [id number]  HPI Here to recheck swelling in the legs and cellulitis. She has been taking Keflex with slight improvement in the redness. She is still mildly SOB. The swelling is better since we doubled up on the Lasix. No fever.    Review of Systems  Constitutional: Negative.   Respiratory: Positive for shortness of breath.   Cardiovascular: Positive for leg swelling. Negative for chest pain and palpitations.       Objective:   Physical Exam  Constitutional: She appears well-developed and well-nourished.  Cardiovascular: Normal rate, regular rhythm, normal heart sounds and intact distal pulses.   Pulmonary/Chest: Effort normal and breath sounds normal.  Musculoskeletal:       2+ edema in both legs. They are red and tender, but not warm           Assessment & Plan:  Stay on current meds for the leg edema. We will have her see Dr. Shirlee Latch soon. swithc to Clindamycin for the cellulitis.

## 2011-11-06 ENCOUNTER — Encounter: Payer: Self-pay | Admitting: Cardiology

## 2011-11-06 ENCOUNTER — Telehealth: Payer: Self-pay | Admitting: Family Medicine

## 2011-11-06 ENCOUNTER — Ambulatory Visit (INDEPENDENT_AMBULATORY_CARE_PROVIDER_SITE_OTHER): Payer: Medicare Other | Admitting: Cardiology

## 2011-11-06 VITALS — BP 98/56 | HR 94 | Ht 66.0 in | Wt 205.0 lb

## 2011-11-06 DIAGNOSIS — I509 Heart failure, unspecified: Secondary | ICD-10-CM

## 2011-11-06 DIAGNOSIS — I272 Pulmonary hypertension, unspecified: Secondary | ICD-10-CM

## 2011-11-06 DIAGNOSIS — I2789 Other specified pulmonary heart diseases: Secondary | ICD-10-CM

## 2011-11-06 DIAGNOSIS — E662 Morbid (severe) obesity with alveolar hypoventilation: Secondary | ICD-10-CM

## 2011-11-06 DIAGNOSIS — I2510 Atherosclerotic heart disease of native coronary artery without angina pectoris: Secondary | ICD-10-CM

## 2011-11-06 NOTE — Patient Instructions (Signed)
Your physician has requested that you have an echocardiogram. Echocardiography is a painless test that uses sound waves to create images of your heart. It provides your doctor with information about the size and shape of your heart and how well your heart's chambers and valves are working. This procedure takes approximately one hour. There are no restrictions for this procedure.  You have been referred to Dr Craige Cotta.  Your physician recommends that you schedule a follow-up appointment in: 2 months with Dr Shirlee Latch.

## 2011-11-06 NOTE — Progress Notes (Signed)
PCP: Dr. Clent Ridges  66 yo with history of CAD s/p RCA PCI in 2002 and CHF with peripheral edema presented initially for evaluation of shortness of breath and edema. Patient had significant peripheral edema in 2009. Workup at that time revealed a normal stress test and preserved EF on echo. Patient was treated with Lasix and swelling improved. She redeveloped significant lower extremity edema in early 2010 as well . Echo was done showing RV failure and pulmonary hypertension.  We found her oxygen saturation to be low on room air at rest and with exertion. Right heart catheterization showed moderate pulmonary hypertension that responded well to oxygen with decreased PA pressure. She had PFTs that suggested COPD and was diagnosed with OHS/OSA by the pulmonary service. She has quit smoking and is using home oxygen but is unable to tolerate CPAP.   Patient still has significant dyspnea. She is using oxygen at all times.  She has stable dyspnea after walking 50-100 feet. She gets very short of breath pushing her trashcans to the street.  No chest pain.  She was seen by Dr. Clent Ridges recently because of lower leg redness and increased lower leg edema.  Lasix was increased on 6/3 to 80 mg bid.  She reports a decrease in her lower leg edema though dyspnea is unchanged.    ECG: NSR, iRBBB, anterior nonspecific T wave inversions  Labs (12/10): creatinine 0.8, BNP 73, TSH normal, HCT 42.4, ANA negative  Labs (1/11): K 4.4, creatinine 0.8  Labs (6/11): K 3.7, creatinine 0.9, BNP 37, HDL 38, TGs 213, LDL 137  Labs (11/11): LDL 87, HDL 39, LFTs normal  Labs 19/14): BNP 12  Labs (6/13): K 3.9, creatinine 0.7, proBNP 55, TSH normal  Allergies (verified):  1) ! Codeine   Past Medical History:  1. GERD  2. Hyperlipidemia  3. Hypertension  4. CAD: MI 2002 with PCI to proximal and distal RCA. Adenosine myoview (4/09) with EF 72%, no ischemia or infarction.  5. Diabetes mellitus, type II  6. CHF: Echo (4/09) showed EF 60-65%  with mild diastolic dysfunction. Echo (12/10) showed mild LVH, EF 60-65%, mild AS (mean gradient 13 mmHg), mildly dilated RV with mild to moderate RV systolic dysfunction, and PASP 59 mmHg (moderate pulmonary hypertension). Echo (11/11): EF 60-65%, mild aortic stenosis with mean gradient 12 mmHg, grade I diastolic dysfunction, normal RV size and systolic function, unable to estimate PA systolic pressure.  7. COPD: Has quit smoking.  - PFT 06/13/09 FEV1 1.97(76%), FEV1% 73, TLC 5.35(101%), DLCO 65%  8. OSA/obesity-hypoventilation  - PSG 06/19/09 AHI 13  - ABG 06/13/09 7.44/50.6/55 on room air  - intolerant of CPAP/BPAP  9. 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 RHC (1/11) showed mean RA pressure 15, PA 57/42 mean 41 on room air and PA 43/24 mean 32 on nonrebreather mask, PCWP 15, SVO2 51%, CI 1.9. ANA was negative and V/Q scan did not show evidence for chronic PE. RV appeared improved on 11/11 echo.  10. Mild aortic stenosis.   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  pt has no children.  pt started smoking at age 49s. 1 1/2 ppd for years but quit 1/11 after  using Chantix.   Review of Systems  All systems reviewed and negative except as per HPI.   Current Outpatient Prescriptions  Medication Sig Dispense Refill  . albuterol (PROAIR HFA) 108 (90 BASE) MCG/ACT inhaler Inhale 2 puffs into the lungs every 6 (six) hours as needed.        . ALPRAZolam (XANAX) 0.5 MG tablet Take 1 tablet (0.5 mg total) by mouth 3 (three) times daily as needed for anxiety.  90 tablet  5  . CRESTOR 40 MG tablet TAKE 1 TABLET BY MOUTH  DAILY  30 tablet  11  . furosemide (LASIX) 40 MG tablet Take 2 tablets (80 mg total) by mouth 2 (two) times daily.  120 tablet  11  . gabapentin (NEURONTIN) 300 MG capsule Take 1 capsule (300 mg total) by mouth 2 (two) times daily.  60 capsule  5  . glipiZIDE (GLUCOTROL) 10 MG tablet TAKE 1 TABLET BY MOUTH 2 TIMES A DAY  60 tablet  11  . glucose blood (ACCU-CHEK AVIVA) test strip 1 each by Other route 2 (two) times daily.  100 each  11  . Hydrocodone-Acetaminophen 10-660 MG TABS Take 1 tablet by mouth every 6 (six) hours as needed (pain).  60 each  5  . metFORMIN (GLUCOPHAGE) 1000 MG tablet TAKE 1 TABLET (1,000 MG TOTAL) BY MOUTH 2 (TWO) TIMES DAILY.  60 tablet  11  . NEOMYCIN-POLYMYXIN-HC, OPHTH, SUSP Apply 2 drops to eye 4 (four) times daily as needed.      . NON FORMULARY Oxygen 3L       . omeprazole (PRILOSEC) 40 MG capsule TAKE 1 CAPSULE EVERY DAY  30 capsule  11  . potassium chloride SA (KLOR-CON M20) 20 MEQ tablet Take 1 tablet (20 mEq total) by mouth 2 (two) times daily.  60 tablet  11  . spironolactone (ALDACTONE) 25 MG tablet TAKE 1 TABLET BY MOUTH DAILY  30 tablet  11  . budesonide-formoterol (SYMBICORT) 160-4.5 MCG/ACT inhaler Inhale 2 puffs into the lungs 2 (two) times daily.  1 Inhaler  12  . clindamycin (CLEOCIN) 300 MG capsule Take 1 capsule (300 mg total) by mouth 3 (three) times daily.  90 capsule  0    BP 98/56  Pulse 94  Ht 5\' 6"  (1.676 m)  Wt 92.987 kg (205 lb)  BMI 33.09 kg/m2  SpO2 91% General: NAD, obese Neck: Thick, JVP difficult, no thyromegaly or thyroid nodule.  Lungs: Slight crackles at the bases.  CV: Nondisplaced PMI.  Heart regular S1/S2, no S3/S4, 2/6 early SEM RUSB.  1+ edema to knee on right, 1+ edema 1/4 up lower leg on left.  No carotid bruit.  Normal pedal pulses.  Abdomen: Soft, nontender, no hepatosplenomegaly, no distention.  Skin: Erythema anterior right lower leg.  Neurologic: Alert and oriented x 3.  Psych: Normal affect. Extremities: No  clubbing or cyanosis.

## 2011-11-06 NOTE — Telephone Encounter (Signed)
Refill request for Hydrocodone-Homatropine syrup take 1 tsp every 4 hrs prn.

## 2011-11-07 ENCOUNTER — Other Ambulatory Visit: Payer: Self-pay | Admitting: *Deleted

## 2011-11-07 DIAGNOSIS — I2510 Atherosclerotic heart disease of native coronary artery without angina pectoris: Secondary | ICD-10-CM | POA: Insufficient documentation

## 2011-11-07 MED ORDER — HYDROCODONE-HOMATROPINE 5-1.5 MG/5ML PO SYRP: 5.0000 mL | ORAL_SOLUTION | ORAL | Status: AC | PRN

## 2011-11-07 NOTE — Telephone Encounter (Signed)
I called in script 

## 2011-11-07 NOTE — Telephone Encounter (Signed)
Pharmacist called to check on status of getting refill for pts Hydrocodone-Homatropine syrup.

## 2011-11-07 NOTE — Telephone Encounter (Signed)
Call in another 240 ml bottle to use 5 ml q 4 hours prn cough

## 2011-11-07 NOTE — Assessment & Plan Note (Signed)
Chronic diastolic CHF.  Stable to mildly worsened NYHA class III symptoms.  I agree with increasing her lasix to 80 mg bid.  Her lower extremity edema is improving so will keep her on that dose for now.  I will get an echo to reassess LV function and PA pressure.

## 2011-11-07 NOTE — Assessment & Plan Note (Signed)
Jillian Bright wears oxygen at all times but has not been able to tolerate CPAP.  She has chronic exertional dyspnea likely due to OHS/OSA, diastolic CHF, and deconditioning.  She has a history of pulmonary arterial HTN likely secondary to OHS/OSA.  PA pressures improved on RHC with oxygen.  I will reassess PA pressure noninvasively with an echo.  I will have her followup with Dr. Craige Cotta also.

## 2011-11-07 NOTE — Assessment & Plan Note (Signed)
Distant history of RCA PCI.  No chest pain.  Last myoview in 2009 with no ischemia.  Needs to continue ASA, statin.  Needs lipids checked with goal LDL < 70.

## 2011-11-12 ENCOUNTER — Other Ambulatory Visit (INDEPENDENT_AMBULATORY_CARE_PROVIDER_SITE_OTHER): Payer: Medicare Other

## 2011-11-12 ENCOUNTER — Ambulatory Visit (HOSPITAL_COMMUNITY): Payer: Medicare Other | Attending: Cardiology

## 2011-11-12 DIAGNOSIS — I517 Cardiomegaly: Secondary | ICD-10-CM | POA: Insufficient documentation

## 2011-11-12 DIAGNOSIS — R0989 Other specified symptoms and signs involving the circulatory and respiratory systems: Secondary | ICD-10-CM | POA: Insufficient documentation

## 2011-11-12 DIAGNOSIS — J4489 Other specified chronic obstructive pulmonary disease: Secondary | ICD-10-CM | POA: Insufficient documentation

## 2011-11-12 DIAGNOSIS — R0609 Other forms of dyspnea: Secondary | ICD-10-CM | POA: Insufficient documentation

## 2011-11-12 DIAGNOSIS — I359 Nonrheumatic aortic valve disorder, unspecified: Secondary | ICD-10-CM

## 2011-11-12 DIAGNOSIS — I509 Heart failure, unspecified: Secondary | ICD-10-CM

## 2011-11-12 DIAGNOSIS — E662 Morbid (severe) obesity with alveolar hypoventilation: Secondary | ICD-10-CM | POA: Insufficient documentation

## 2011-11-12 DIAGNOSIS — I2510 Atherosclerotic heart disease of native coronary artery without angina pectoris: Secondary | ICD-10-CM | POA: Insufficient documentation

## 2011-11-12 DIAGNOSIS — R609 Edema, unspecified: Secondary | ICD-10-CM | POA: Insufficient documentation

## 2011-11-12 DIAGNOSIS — J449 Chronic obstructive pulmonary disease, unspecified: Secondary | ICD-10-CM | POA: Insufficient documentation

## 2011-11-12 DIAGNOSIS — E119 Type 2 diabetes mellitus without complications: Secondary | ICD-10-CM | POA: Insufficient documentation

## 2011-11-12 DIAGNOSIS — I059 Rheumatic mitral valve disease, unspecified: Secondary | ICD-10-CM | POA: Insufficient documentation

## 2011-11-12 DIAGNOSIS — I2789 Other specified pulmonary heart diseases: Secondary | ICD-10-CM | POA: Insufficient documentation

## 2011-11-12 DIAGNOSIS — I272 Pulmonary hypertension, unspecified: Secondary | ICD-10-CM

## 2011-11-12 LAB — LIPID PANEL
HDL: 40.3 mg/dL (ref >39.00)
LDL Cholesterol: 68 mg/dL (ref 0–99)
VLDL: 27.4 mg/dL (ref 0.0–40.0)

## 2011-11-12 NOTE — Progress Notes (Signed)
Echocardiogram performed.  

## 2011-11-26 ENCOUNTER — Ambulatory Visit (INDEPENDENT_AMBULATORY_CARE_PROVIDER_SITE_OTHER)
Admission: RE | Admit: 2011-11-26 | Discharge: 2011-11-26 | Disposition: A | Discharge status: Home / Self Care | Payer: Medicare Other | Source: Ambulatory Visit | Attending: Pulmonary Disease | Admitting: Pulmonary Disease

## 2011-11-26 ENCOUNTER — Encounter: Payer: Self-pay | Admitting: Pulmonary Disease

## 2011-11-26 ENCOUNTER — Ambulatory Visit (INDEPENDENT_AMBULATORY_CARE_PROVIDER_SITE_OTHER): Payer: Medicare Other | Admitting: Pulmonary Disease

## 2011-11-26 VITALS — BP 100/56 | HR 98 | Temp 97.9°F | Ht 66.0 in | Wt 209.0 lb

## 2011-11-26 DIAGNOSIS — R0600 Dyspnea, unspecified: Secondary | ICD-10-CM

## 2011-11-26 DIAGNOSIS — R0989 Other specified symptoms and signs involving the circulatory and respiratory systems: Secondary | ICD-10-CM

## 2011-11-26 DIAGNOSIS — E662 Morbid (severe) obesity with alveolar hypoventilation: Secondary | ICD-10-CM

## 2011-11-26 DIAGNOSIS — I2789 Other specified pulmonary heart diseases: Secondary | ICD-10-CM

## 2011-11-26 DIAGNOSIS — J4489 Other specified chronic obstructive pulmonary disease: Secondary | ICD-10-CM

## 2011-11-26 DIAGNOSIS — J449 Chronic obstructive pulmonary disease, unspecified: Secondary | ICD-10-CM

## 2011-11-26 DIAGNOSIS — R0609 Other forms of dyspnea: Secondary | ICD-10-CM

## 2011-11-26 DIAGNOSIS — G4733 Obstructive sleep apnea (adult) (pediatric): Secondary | ICD-10-CM

## 2011-11-26 MED ORDER — ALBUTEROL SULFATE HFA 108 (90 BASE) MCG/ACT IN AERS: 2.0000 | INHALATION_SPRAY | Freq: Four times a day (QID) | RESPIRATORY_TRACT | Status: DC | PRN

## 2011-11-26 MED ORDER — BUDESONIDE-FORMOTEROL FUMARATE 160-4.5 MCG/ACT IN AERO: 2.0000 | INHALATION_SPRAY | Freq: Two times a day (BID) | RESPIRATORY_TRACT | Status: DC

## 2011-11-26 NOTE — Assessment & Plan Note (Signed)
She has oxygen desaturation with exertion in spite of being on 3 liters oxygen.  Will increase oxygen to 4 liters 24/7 and arrange for overnight oximetry.

## 2011-11-26 NOTE — Progress Notes (Signed)
Subjective:    Patient ID: Jillian Bright, female    DOB: [birth year], 66 y.o.   MRN: [id number]  HPI 66 yo female ex-smoker with mild COPD, OSA/OHS unable to tolerate CPAP, and secondary pulmonary hypertension.  I last saw her in April 2012.  She is not currently using inhalers.  She has not had recent chest xray or PFT.  She was seen by cardiology recently.  She was noted to have continued exertional dyspnea, and advised to follow up with pulmonary service.  She continues to have cough, wheeze, and sputum production.  She has been getting nose bleeds from her left nostril.  She felt that symbicort helped some when she was using this.  She is currently using 3 liters oxygen.  She again states that she will not consider using CPAP again.  Past Medical History  Diagnosis Date  . Diabetes mellitus   . COPD (chronic obstructive pulmonary disease)     sees Dr. Coralyn Helling  . Sleep apnea, obstructive   . Pulmonary HTN      No family history on file.   History   Social History  . Marital Status: Widowed    Spouse Name: N/A    Number of Children: N/A  . Years of Education: N/A   Occupational History  . Not on file.   Social History Main Topics  . Smoking status: Former Smoker -- 2.0 packs/day for 36 years    Types: Cigarettes    Quit date: 05/28/2009  . Smokeless tobacco: Never Used  . Alcohol Use: Yes     few times a year  . Drug Use: No  . Sexually Active: Not on file   Other Topics Concern  . Not on file   Social History Narrative  . No narrative on file     Allergies  Allergen Reactions  . Codeine      Outpatient Prescriptions Prior to Visit  Medication Sig Dispense Refill  . ALPRAZolam (XANAX) 0.5 MG tablet Take 1 tablet (0.5 mg total) by mouth 3 (three) times daily as needed for anxiety.  90 tablet  5  . aspirin EC 81 MG tablet Take 1 tablet (81 mg total) by mouth daily.      . CRESTOR 40 MG tablet TAKE 1 TABLET BY MOUTH DAILY  30 tablet  11  .  furosemide (LASIX) 40 MG tablet Take 2 tablets (80 mg total) by mouth 2 (two) times daily.  120 tablet  11  . gabapentin (NEURONTIN) 300 MG capsule Take 1 capsule (300 mg total) by mouth 2 (two) times daily.  60 capsule  5  . glipiZIDE (GLUCOTROL) 10 MG tablet TAKE 1 TABLET BY MOUTH 2 TIMES A DAY  60 tablet  11  . glucose blood (ACCU-CHEK AVIVA) test strip 1 each by Other route 2 (two) times daily.  100 each  11  . Hydrocodone-Acetaminophen 10-660 MG TABS Take 1 tablet by mouth every 6 (six) hours as needed (pain).  60 each  5  . metFORMIN (GLUCOPHAGE) 1000 MG tablet TAKE 1 TABLET (1,000 MG TOTAL) BY MOUTH 2 (TWO) TIMES DAILY.  60 tablet  11  . NON FORMULARY Oxygen 3L       . omeprazole (PRILOSEC) 40 MG capsule TAKE 1 CAPSULE EVERY DAY  30 capsule  11  . potassium chloride SA (KLOR-CON M20) 20 MEQ tablet Take 1 tablet (20 mEq total) by mouth 2 (two) times daily.  60 tablet  11  . spironolactone (ALDACTONE) 25 MG  tablet TAKE 1 TABLET BY MOUTH DAILY  30 tablet  11  . albuterol (PROAIR HFA) 108 (90 BASE) MCG/ACT inhaler Inhale 2 puffs into the lungs every 6 (six) hours as needed.        . budesonide-formoterol (SYMBICORT) 160-4.5 MCG/ACT inhaler Inhale 2 puffs into the lungs 2 (two) times daily.  1 Inhaler  12  . NEOMYCIN-POLYMYXIN-HC, OPHTH, SUSP Apply 2 drops to eye 4 (four) times daily as needed.           Objective:   Physical Exam Filed Vitals:   11/26/11 1531 11/26/11 1532  BP:  100/56  Pulse:  98  Temp:  97.9 F (36.6 C)  TempSrc:  Oral  Height:  5\' 6"  (1.676 m)  Weight:  209 lb (94.802 kg)  SpO2: 86% 93%   General: obese, on supplemental oxygen.  Nose: no deformity, discharge, inflammation, or lesions  Mouth: MP 3, no oral lesions, wears dentures  Neck: no JVD.  Lungs: diminished breath sounds, no wheezing or rales  Heart: regular rhythm, normal rate, and no murmurs.  Extremities: minimal ankle edema  Neurologic: normal CN II-XII and strength normal.  Cervical Nodes: no  significant adenopathy  Lab Results  Component Value Date   WBC 7.2 10/29/2011   HGB 13.9 10/29/2011   HCT 45.5 10/29/2011   MCV 75.9* 10/29/2011   PLT 238.0 10/29/2011   Lab Results  Component Value Date   CREATININE 0.7 10/29/2011   BUN 12 10/29/2011   NA 139 10/29/2011   K 3.9 10/29/2011   CL 93* 10/29/2011   CO2 36* 10/29/2011   Lab Results  Component Value Date   ALT 17 10/29/2011   AST 18 10/29/2011   ALKPHOS 70 10/29/2011   BILITOT 0.6 10/29/2011       Component Value Date/Time   PROBNP 55.0 10/29/2011 1407    Echo 11/12/11>>mild LVH, EF 60 to 65%, grade 1 diastolic dysfx, mod AS, mild RV systolic dysfx, PAS  Assessment & Plan:    Updated Medication List Outpatient Encounter Prescriptions as of 11/26/2011  Medication Sig Dispense Refill  . ALPRAZolam (XANAX) 0.5 MG tablet Take 1 tablet (0.5 mg total) by mouth 3 (three) times daily as needed for anxiety.  90 tablet  5  . aspirin EC 81 MG tablet Take 1 tablet (81 mg total) by mouth daily.      . CRESTOR 40 MG tablet TAKE 1 TABLET BY MOUTH DAILY  30 tablet  11  . furosemide (LASIX) 40 MG tablet Take 2 tablets (80 mg total) by mouth 2 (two) times daily.  120 tablet  11  . gabapentin (NEURONTIN) 300 MG capsule Take 1 capsule (300 mg total) by mouth 2 (two) times daily.  60 capsule  5  . glipiZIDE (GLUCOTROL) 10 MG tablet TAKE 1 TABLET BY MOUTH 2 TIMES A DAY  60 tablet  11  . glucose blood (ACCU-CHEK AVIVA) test strip 1 each by Other route 2 (two) times daily.  100 each  11  . Guaifenesin 1200 MG TB12 Take 1 tablet by mouth every 12 (twelve) hours as needed.      . Hydrocodone-Acetaminophen 10-660 MG TABS Take 1 tablet by mouth every 6 (six) hours as needed (pain).  60 each  5  . metFORMIN (GLUCOPHAGE) 1000 MG tablet TAKE 1 TABLET (1,000 MG TOTAL) BY MOUTH 2 (TWO) TIMES DAILY.  60 tablet  11  . NON FORMULARY Oxygen 3L       . omeprazole (PRILOSEC) 40  MG capsule TAKE 1 CAPSULE EVERY DAY  30 capsule  11  . potassium chloride SA (KLOR-CON M20)  20 MEQ tablet Take 1 tablet (20 mEq total) by mouth 2 (two) times daily.  60 tablet  11  . solifenacin (VESICARE) 5 MG tablet Take 10 mg by mouth daily.      Marland Kitchen spironolactone (ALDACTONE) 25 MG tablet TAKE 1 TABLET BY MOUTH DAILY  30 tablet  11  . albuterol (PROAIR HFA) 108 (90 BASE) MCG/ACT inhaler Inhale 2 puffs into the lungs every 6 (six) hours as needed.        . budesonide-formoterol (SYMBICORT) 160-4.5 MCG/ACT inhaler Inhale 2 puffs into the lungs 2 (two) times daily.  1 Inhaler  12  . NEOMYCIN-POLYMYXIN-HC, OPHTH, SUSP Apply 2 drops to eye 4 (four) times daily as needed.

## 2011-11-26 NOTE — Assessment & Plan Note (Signed)
This is secondary to her COPD, OSA, OHS, valvular heart disease and diastolic dysfunction. Continue to optimize therapies for these. Explained to her that we may be somewhat limited in our ability to improve this without being able to treat her sleep apnea more aggressively.

## 2011-11-26 NOTE — Assessment & Plan Note (Signed)
She is adamant about not wanting to use CPAP therapy again.

## 2011-11-26 NOTE — Assessment & Plan Note (Signed)
Will repeat PFT and CXR.  Will restart symbicort and prn albuterol.

## 2011-11-26 NOTE — Patient Instructions (Addendum)
Chest xray today Will arrange for breathing test (PFT) Symbicort two puffs twice per day, and rinse mouth after each use Proair two puffs up to four times per day as needed for cough, wheeze, or chest congestion Increase oxygen to 4 liters during day Will arrange for oxygen test at night Follow up in 3 to 4 weeks

## 2011-11-27 ENCOUNTER — Telehealth: Payer: Self-pay | Admitting: Pulmonary Disease

## 2011-11-27 NOTE — Telephone Encounter (Signed)
I spoke with patient about results and she verbalized understanding and had no questions 

## 2011-11-27 NOTE — Telephone Encounter (Signed)
Dg Chest 2 View  11/26/2011  *RADIOLOGY REPORT*  Clinical Data: Cough and shortness of breath.  Former smoker.  CHEST - 2 VIEW  Comparison: 04/03/2011  Findings: Mild cardiac enlargement.  No pleural effusion or edema. No airspace consolidation identified.  Chronic interstitial coarsening noted compatible with COPD.  IMPRESSION:  1.  No acute cardiopulmonary abnormalities.  Original Report Authenticated By: Rosealee Albee, M.D.    Will have my nurse inform patient that CXR showed expected changes from COPD.  No other worrisome findings.  Will discuss in more detail at next visit.  No change to current plan.

## 2011-12-06 ENCOUNTER — Telehealth: Payer: Self-pay | Admitting: Family Medicine

## 2011-12-06 NOTE — Telephone Encounter (Signed)
Refill request for Alprazolam 0.5 mg take 1 po tid prn and last here on 11/05/11.

## 2011-12-07 MED ORDER — ALPRAZOLAM 0.5 MG PO TABS: 0.5000 mg | ORAL_TABLET | Freq: Three times a day (TID) | ORAL | Status: DC | PRN

## 2011-12-07 NOTE — Telephone Encounter (Signed)
Call in #[phone] with 5 rf 

## 2011-12-25 ENCOUNTER — Ambulatory Visit (INDEPENDENT_AMBULATORY_CARE_PROVIDER_SITE_OTHER): Payer: Medicare Other | Admitting: Pulmonary Disease

## 2011-12-25 ENCOUNTER — Encounter: Payer: Self-pay | Admitting: Pulmonary Disease

## 2011-12-25 VITALS — BP 102/62 | HR 95 | Temp 98.0°F | Ht 66.0 in | Wt 206.0 lb

## 2011-12-25 DIAGNOSIS — R0902 Hypoxemia: Secondary | ICD-10-CM

## 2011-12-25 DIAGNOSIS — J9611 Chronic respiratory failure with hypoxia: Secondary | ICD-10-CM

## 2011-12-25 DIAGNOSIS — J9610 Chronic respiratory failure, unspecified whether with hypoxia or hypercapnia: Secondary | ICD-10-CM

## 2011-12-25 DIAGNOSIS — J984 Other disorders of lung: Secondary | ICD-10-CM

## 2011-12-25 DIAGNOSIS — R0600 Dyspnea, unspecified: Secondary | ICD-10-CM

## 2011-12-25 DIAGNOSIS — J449 Chronic obstructive pulmonary disease, unspecified: Secondary | ICD-10-CM

## 2011-12-25 DIAGNOSIS — E662 Morbid (severe) obesity with alveolar hypoventilation: Secondary | ICD-10-CM

## 2011-12-25 DIAGNOSIS — R0989 Other specified symptoms and signs involving the circulatory and respiratory systems: Secondary | ICD-10-CM

## 2011-12-25 LAB — PULMONARY FUNCTION TEST

## 2011-12-25 MED ORDER — TIOTROPIUM BROMIDE MONOHYDRATE 18 MCG IN CAPS: 18.0000 ug | ORAL_CAPSULE | Freq: Every day | RESPIRATORY_TRACT | Status: DC

## 2011-12-25 NOTE — Progress Notes (Signed)
Chief Complaint  Patient presents with  . Follow-up    Review PFT. Pt wants to know about switching to a different tank thats a little more efficient for her- she is concerned when she is out about her O2 running out. Pt states breathing has been about the same-no change.      History of Present Illness: Jillian Bright is a 66 y.o. female ex-smoker with dyspnea from COPD, OSA/OHS unable to tolerate CPAP, moderate aortic stenosis, chronic diastolic heart failure and secondary pulmonary hypertension.  She thinks symbicort has helped some with her breathing.  She still gets winded easily.  She has been using 4 liters oxygen 24/7.  She did not have ONO done.  She would like a different oxygen set up.  Her current portable tanks don't last very long, and as a result she can't go out for very long.   Past Medical History  Diagnosis Date  . Diabetes mellitus   . COPD (chronic obstructive pulmonary disease)     sees Dr. Coralyn Helling  . Sleep apnea, obstructive   . Pulmonary HTN     Past Surgical History  Procedure Date  . Appendectomy   . Tonsillectomy and adenoidectomy   . Lumbar disc surgery   . Hemorrhoid surgery     Outpatient Encounter Prescriptions as of 12/25/2011  Medication Sig Dispense Refill  . albuterol (PROAIR HFA) 108 (90 BASE) MCG/ACT inhaler Inhale 2 puffs into the lungs every 6 (six) hours as needed for wheezing.  1 Inhaler  3  . ALPRAZolam (XANAX) 0.5 MG tablet Take 1 tablet (0.5 mg total) by mouth 3 (three) times daily as needed for anxiety.  90 tablet  5  . aspirin EC 81 MG tablet Take 1 tablet (81 mg total) by mouth daily.      . budesonide-formoterol (SYMBICORT) 160-4.5 MCG/ACT inhaler Inhale 2 puffs into the lungs 2 (two) times daily.  1 Inhaler  6  . CRESTOR 40 MG tablet TAKE 1 TABLET BY MOUTH DAILY  30 tablet  11  . furosemide (LASIX) 40 MG tablet Take 2 tablets (80 mg total) by mouth 2 (two) times daily.  120 tablet  11  . gabapentin (NEURONTIN) 300 MG capsule  Take 1 capsule (300 mg total) by mouth 2 (two) times daily.  60 capsule  5  . glipiZIDE (GLUCOTROL) 10 MG tablet TAKE 1 TABLET BY MOUTH 2 TIMES A DAY  60 tablet  11  . glucose blood (ACCU-CHEK AVIVA) test strip 1 each by Other route 2 (two) times daily.  100 each  11  . Guaifenesin 1200 MG TB12 Take 1 tablet by mouth every 12 (twelve) hours as needed.      . Hydrocodone-Acetaminophen 10-660 MG TABS Take 1 tablet by mouth every 6 (six) hours as needed (pain).  60 each  5  . metFORMIN (GLUCOPHAGE) 1000 MG tablet TAKE 1 TABLET (1,000 MG TOTAL) BY MOUTH 2 (TWO) TIMES DAILY.  60 tablet  11  . NEOMYCIN-POLYMYXIN-HC, OPHTH, SUSP Apply 2 drops to eye 4 (four) times daily as needed.      . NON FORMULARY Oxygen 3L       . omeprazole (PRILOSEC) 40 MG capsule TAKE 1 CAPSULE EVERY DAY  30 capsule  11  . potassium chloride SA (KLOR-CON M20) 20 MEQ tablet Take 1 tablet (20 mEq total) by mouth 2 (two) times daily.  60 tablet  11  . solifenacin (VESICARE) 5 MG tablet Take 10 mg by mouth daily.      Marland Kitchen  spironolactone (ALDACTONE) 25 MG tablet TAKE 1 TABLET BY MOUTH DAILY  30 tablet  11  . budesonide-formoterol (SYMBICORT) 160-4.5 MCG/ACT inhaler Inhale 2 puffs into the lungs 2 (two) times daily.  1 Inhaler  12  . DISCONTD: albuterol (PROAIR HFA) 108 (90 BASE) MCG/ACT inhaler Inhale 2 puffs into the lungs every 6 (six) hours as needed.          Allergies  Allergen Reactions  . Codeine     Physical Exam:  Blood pressure 102/62, pulse 95, temperature 98 F (36.7 C), temperature source Oral, height 5\' 6"  (1.676 m), weight 206 lb (93.441 kg), SpO2 91.00%.  Body mass index is 33.25 kg/(m^2). Wt Readings from Last 2 Encounters:  12/25/11 206 lb (93.441 kg)  11/26/11 209 lb (94.802 kg)    General - No distress, wearing oxygen ENT - no sinus tenderness, no oral exudate, no LAN, no thyromegaly Cardiac - s1s2 regular, 2/6 SM Chest - normal respiratory excursion, prolonged expiratory phase, no wheeze, faint  basilar rales Back - no focal tenderness Abd - soft, non-tender, + bowel sounds Ext - normal motor strength Neuro - Cranial nerves are normal. PERLA. EOM's intact. Skin - no discernible active dermatitis, erythema, urticaria or inflammatory process. Psych - normal mood, and behavior.   Dg Chest 2 View 11/26/2011  *RADIOLOGY REPORT*  Clinical Data: Cough and shortness of breath.  Former smoker.  CHEST - 2 VIEW  Comparison: 04/03/2011  Findings: Mild cardiac enlargement.  No pleural effusion or edema. No airspace consolidation identified.  Chronic interstitial coarsening noted compatible with COPD.  IMPRESSION:  1.  No acute cardiopulmonary abnormalities.  Original Report Authenticated By: Rosealee Albee, M.D.    PFT 12/25/11>>FEV1 1.17 (52%), FEV1% 58, TLC 3.67 (70%), DLCO 50%, +BD  Assessment/Plan:  Coralyn Helling, MD [facility] Pager:  [phone] 12/25/2011, 4:18 PM

## 2011-12-25 NOTE — Progress Notes (Signed)
PFT done today. 

## 2011-12-25 NOTE — Assessment & Plan Note (Signed)
She has moderate to severe obstruction on PFT with good bronchodilator response, and diffusion defect.  She is to continue symbicort, and will try adding spiriva again.  She is to use proair as needed.  I have explained that, given her PFT findings, she will likely always have trouble with her breathing.

## 2011-12-25 NOTE — Assessment & Plan Note (Signed)
Continue oxygen to 4 liters 24/7 and arrange for overnight oximetry.

## 2011-12-25 NOTE — Patient Instructions (Signed)
Will schedule CT chest and call with results Will schedule overnight oxygen test and call with results Will try to arrange for portable oxygen concentrator Spiriva one puff daily Symbicort two puffs twice per day, and rinse mouth after each use Proair two puffs as needed for cough, wheeze, or chest congestion Follow up in 3 months

## 2011-12-28 ENCOUNTER — Other Ambulatory Visit: Payer: Self-pay | Admitting: Pulmonary Disease

## 2011-12-28 DIAGNOSIS — J449 Chronic obstructive pulmonary disease, unspecified: Secondary | ICD-10-CM

## 2011-12-31 ENCOUNTER — Institutional Professional Consult (permissible substitution): Payer: Medicare Other | Admitting: Emergency Medicine

## 2011-12-31 ENCOUNTER — Other Ambulatory Visit (INDEPENDENT_AMBULATORY_CARE_PROVIDER_SITE_OTHER): Payer: Medicare Other

## 2011-12-31 ENCOUNTER — Ambulatory Visit (INDEPENDENT_AMBULATORY_CARE_PROVIDER_SITE_OTHER)
Admission: RE | Admit: 2011-12-31 | Discharge: 2011-12-31 | Disposition: A | Discharge status: Home / Self Care | Payer: Medicare Other | Source: Ambulatory Visit | Attending: Pulmonary Disease | Admitting: Pulmonary Disease

## 2011-12-31 DIAGNOSIS — J984 Other disorders of lung: Secondary | ICD-10-CM

## 2011-12-31 DIAGNOSIS — J449 Chronic obstructive pulmonary disease, unspecified: Secondary | ICD-10-CM

## 2011-12-31 LAB — BASIC METABOLIC PANEL
CO2: 36 mEq/L — ABNORMAL HIGH (ref 19–32)
Calcium: 9.2 mg/dL (ref 8.4–10.5)
Chloride: 90 mEq/L — ABNORMAL LOW (ref 96–112)
Glucose, Bld: 166 mg/dL — ABNORMAL HIGH (ref 70–99)
Sodium: 136 mEq/L (ref 135–145)

## 2011-12-31 MED ORDER — IOHEXOL 300 MG/ML  SOLN
80.0000 mL | Freq: Once | INTRAMUSCULAR | Status: AC | PRN
  Administered 2011-12-31: 80 mL via INTRAVENOUS

## 2012-01-01 ENCOUNTER — Telehealth: Payer: Self-pay | Admitting: Family Medicine

## 2012-01-01 NOTE — Telephone Encounter (Signed)
Refill request for Hydrocodon-Acetaminophn 10-660 mg take 1 po q6hrs prn and last here 11/05/11.

## 2012-01-02 ENCOUNTER — Telehealth: Payer: Self-pay | Admitting: Pulmonary Disease

## 2012-01-02 MED ORDER — HYDROCODONE-ACETAMINOPHEN 10-660 MG PO TABS: 1.0000 | ORAL_TABLET | Freq: Four times a day (QID) | ORAL | Status: DC | PRN

## 2012-01-02 NOTE — Telephone Encounter (Signed)
It has been 48 hrs since she had CT chest.  She can resume metformin.

## 2012-01-02 NOTE — Telephone Encounter (Signed)
Pt is aware okay to resume the metformin. She voiced her understanding and needed nothing further

## 2012-01-02 NOTE — Telephone Encounter (Signed)
Pt returned call. Call cell #. Jillian Bright

## 2012-01-02 NOTE — Telephone Encounter (Signed)
Called pt's home # - [email or web address]  Called pt's cell # - [email or web address]

## 2012-01-02 NOTE — Telephone Encounter (Signed)
I spoke with pt and she states she had to stop her metformin to do her CT scan. This morning her BS was 282. She called over to CT and was advised VS would have to be the one to tell her she start taking the metformin back again since he ordered the CT scan. Pt also requesting CT results. I have paged Dr. Craige Cotta. Please advise Dr. Craige Cotta thanks

## 2012-01-02 NOTE — Telephone Encounter (Signed)
I called in script 

## 2012-01-02 NOTE — Telephone Encounter (Signed)
Call in #60 with 5 rf 

## 2012-01-03 ENCOUNTER — Ambulatory Visit
Admission: RE | Admit: 2012-01-03 | Discharge: 2012-01-03 | Disposition: A | Discharge status: Home / Self Care | Payer: Medicare Other | Source: Ambulatory Visit | Attending: Orthopedic Surgery | Admitting: Orthopedic Surgery

## 2012-01-03 ENCOUNTER — Telehealth: Payer: Self-pay | Admitting: Family Medicine

## 2012-01-03 ENCOUNTER — Other Ambulatory Visit: Payer: Self-pay | Admitting: Orthopedic Surgery

## 2012-01-03 DIAGNOSIS — R52 Pain, unspecified: Secondary | ICD-10-CM

## 2012-01-03 NOTE — Telephone Encounter (Signed)
Patient calling, fell last night on the floor.  She stumbled over something and fell.  She tried to break the fall with the right hand.  Has swelling and extreme pain.  Also has soreness on the left side of her neck with movement or touch.    Triaged per Head Injury and Hand Injury. Needs to be seen in ED.  Patient refuses to go to ED, wants an appointment with Dr. Clent Ridges.  No appointments available.  Please advise per office instructions.

## 2012-01-03 NOTE — Telephone Encounter (Signed)
Pt is unsure if she hit her head.  She is more concerned about her hand.  It was swelling but she has iced it.  She refuses to go the ER and is requesting to see Dr Clent Ridges today.  She does not want to come in tomorrow she wants today.  Explained to pt that Dr Clent Ridges does not have anything today offered appt for tomorrow and she refused for tomorrow.  Told pt about the after hour orthopedic clinic gave pt the phone number.  Told pt she does not referral and they may be able to work her in today before the acute clinic.  Pt agreed to call them

## 2012-01-03 NOTE — Telephone Encounter (Signed)
Agreed. Send her to Orthopedics

## 2012-01-04 ENCOUNTER — Telehealth: Payer: Self-pay | Admitting: Pulmonary Disease

## 2012-01-04 DIAGNOSIS — R590 Localized enlarged lymph nodes: Secondary | ICD-10-CM

## 2012-01-04 NOTE — Telephone Encounter (Signed)
Called spoke with patient who is very upset about not hearing about her CT chest results done on 8.5.13.  Pt fell on yesterday, and had a CT c-spine done thru Dr Claris Che office and was told that the c-spine revealed a lung nodule.  Wants to know why she has not heard anything from this office.  Advised pt will page Dr Craige Cotta.  Management is aware per pt's request and CT needs to be resulted to pt by VS personally today.   Dr Craige Cotta paged at 12:47 pm. Message routed to VS.

## 2012-01-04 NOTE — Telephone Encounter (Signed)
VS returned call.  Stated he will call pt later today.  Will re-route to VS's inbox.

## 2012-01-04 NOTE — Telephone Encounter (Signed)
Pt calling again in ref to previous msg can be reached at  [phone] or [phone].Jillian Bright

## 2012-01-04 NOTE — Telephone Encounter (Signed)
ATC pt to advise her VS schedule is not out for 3 months yet. I will placed reminder in EPIC and a reminder for myself to schedule her once NOV. Schedule is out.

## 2012-01-04 NOTE — Telephone Encounter (Signed)
Noted by triage, will forward VS's nurse Mindy.

## 2012-01-04 NOTE — Telephone Encounter (Signed)
Ct Chest W Contrast  12/31/2011  *RADIOLOGY REPORT*  Clinical Data: Progressive dyspnea, COPD  CT CHEST WITH CONTRAST  Technique:  Multidetector CT imaging of the chest was performed following the standard protocol during bolus administration of intravenous contrast.  Contrast: 80mL OMNIPAQUE IOHEXOL 300 MG/ML  SOLN  Comparison: None.  Findings: Mild paraseptal and centrilobular emphysematous changes. Linear scarring at the right lung base.  No suspicious pulmonary nodules.  No pleural effusion or pneumothorax.  Visualized thyroid is unremarkable.  The heart is normal in size.  No pericardial effusion.  Coronary atherosclerosis.  Atherosclerotic calcifications of the aortic arch.  Mediastinal lymphadenopathy, including a 1.5 cm short axis precarinal node (series 2/image 22).  Additional nodal tissue in the left hilar region (series 2/images 23 and 26).  No suspicious axillary lymphadenopathy.  Visualized upper abdomen notable for atherosclerosis, aorta.  Degenerative changes of the visualized thoracolumbar spine. Multiple vertebral hemangiomas.  IMPRESSION: Mild paraseptal and centrilobular emphysematous changes.  Mediastinal and left hilar lymphadenopathy, as described above, nonspecific/possibly reactive.  Consider follow-up CT chest (with contrast) in 6 months to assess for interval change.  Original Report Authenticated By: Charline Bills, M.D.   Ct Cervical Spine Wo Contrast  01/03/2012  *RADIOLOGY REPORT*  Clinical Data: History of recent fall complaining of neck pain. Evaluate for potential cervical spine fracture.  CT CERVICAL SPINE WITHOUT CONTRAST  Technique:  Multidetector CT imaging of the cervical spine was performed. Multiplanar CT image reconstructions were also generated.  Comparison: No priors.  Comment:  This study is compromised by extensive gross patient motion.  Findings: Gross patient motion results in significant artifact that is most notable on the sagittal reconstructions.  Because of  this artifact, there is an appearance of pseudofractures throughout the cervical spine at multiple levels.  Correlation with the coronal reconstructions and axial images does not clearly identify any definite fractures.  Additionally, the surrounding soft tissues and prevertebral soft tissues are normal.  This makes the likelihood of underlying fracture extremely low.  There is multilevel degenerative disc disease, most pronounced at C4-C5 and C5-C6. Multilevel facet arthropathy is also noted.  Visualized portions of the upper thorax are remarkable for a 1.1 x 0.8 cm ground-glass attenuation nodule in the anterior aspect of the left upper lobe which is incompletely visualized.  IMPRESSION: 1.  The study is very limited secondary to gross patient motion. No definite displaced cervical spine fractures are identified.  If there continues to be strong clinical concern for cervical spine fractures, this examination could be repeated. 2.  Multilevel degenerative disc disease and cervical spondylosis, as above. 3.  Incidentally imaged portions of the upper thorax demonstrate a ground-glass attenuation nodule there is incompletely visualized in the left upper lobe that measures at least 11 x 8 mm. Initial follow-up by chest CT without contrast is recommended in 3 months to confirm persistence.   This recommendation follows the consensus statement: Recommendations for the Management of Subsolid Pulmonary Nodules Detected at CT:  A Statement from the Fleischner Society as published in Radiology 2013; 266:304-317.  These results will be called to the ordering clinician or representative by the Radiologist Assistant, and communication documented in the PACS Dashboard.  Original Report Authenticated By: Florencia Reasons, M.D.    I have reviewed results of CT chest and CT neck with patient.    Explained that CT chest shows changes of emphysema and borderline mediastinal adenopathy.  Explained that there was no evidence for  lung nodule on CT chest.  Explained  that CT neck findings of possible Lt upper lung nodule more likely represent atelectasis since this was not seen on CT chest from 3 days earlier.    She has chronic cough with sputum.  She denies fever, increase in sputum, or change to sputum color.  I explained that she will need follow up with me in 3 months to assess respiratory status (will forward to my nurse to ensure this is scheduled).  Explained that she will need f/u CT chest with IV contrast to assess stability of mediastinal adenopathy and ?of Lt upper lung nodule vs atelectasis.  Will also forward to Dr. Clent Ridges for his review.

## 2012-01-08 NOTE — Telephone Encounter (Signed)
lmomtcb x1 for pt 

## 2012-01-08 NOTE — Telephone Encounter (Signed)
Pt returned Mindy's call.  Informed her of below per Mindy.  She verbalized understanding of this.

## 2012-01-14 ENCOUNTER — Encounter: Payer: Self-pay | Admitting: Cardiology

## 2012-01-14 ENCOUNTER — Ambulatory Visit (INDEPENDENT_AMBULATORY_CARE_PROVIDER_SITE_OTHER): Payer: Medicare Other | Admitting: Cardiology

## 2012-01-14 VITALS — BP 110/64 | HR 72 | Ht 66.0 in | Wt 206.0 lb

## 2012-01-14 DIAGNOSIS — I350 Nonrheumatic aortic (valve) stenosis: Secondary | ICD-10-CM

## 2012-01-14 DIAGNOSIS — E662 Morbid (severe) obesity with alveolar hypoventilation: Secondary | ICD-10-CM

## 2012-01-14 DIAGNOSIS — J449 Chronic obstructive pulmonary disease, unspecified: Secondary | ICD-10-CM

## 2012-01-14 DIAGNOSIS — I359 Nonrheumatic aortic valve disorder, unspecified: Secondary | ICD-10-CM

## 2012-01-14 DIAGNOSIS — J4489 Other specified chronic obstructive pulmonary disease: Secondary | ICD-10-CM

## 2012-01-14 DIAGNOSIS — I2789 Other specified pulmonary heart diseases: Secondary | ICD-10-CM

## 2012-01-14 DIAGNOSIS — I2510 Atherosclerotic heart disease of native coronary artery without angina pectoris: Secondary | ICD-10-CM

## 2012-01-14 HISTORY — DX: Nonrheumatic aortic (valve) stenosis: I35.0

## 2012-01-14 NOTE — Assessment & Plan Note (Signed)
Moderate by last echo.  She would be a poor candidate for open repair if valve worsens (would need to evaluate her for TAVR in that situation).  I will repeat an echo in 6/15 unless symptoms change markedly.

## 2012-01-14 NOTE — Assessment & Plan Note (Signed)
Jillian Bright has pulmonary HTN likely due to a combination of OHS/OSA and COPD.  She has not been able to tolerate CPAP.  Oxygen was recently increased.  She does not have a lot of options for treatment of her pulmonary HTN beyond oxygen titration as it does not look like we will be able to get her on CPAP.  PA systolic pressure was 61 mmHg on last echo with mild RV dilation and dysfunction.

## 2012-01-14 NOTE — Progress Notes (Signed)
PCP: Dr. Clent Ridges  66 yo with history of CAD s/p RCA PCI in 2002 and CHF with peripheral edema presented initially for evaluation of shortness of breath and edema. Patient had significant peripheral edema in 2009. Workup at that time revealed a normal stress test and preserved EF on echo. Patient was treated with Lasix and swelling improved. She redeveloped significant lower extremity edema in early 2010 as well . Echo was done showing RV failure and pulmonary hypertension.  We found her oxygen saturation to be low on room air at rest and with exertion. Right heart catheterization showed moderate pulmonary hypertension that responded well to oxygen with decreased PA pressure. She had PFTs that suggested COPD and was diagnosed with OHS/OSA by the pulmonary service. She has quit smoking and is using home oxygen but is unable to tolerate CPAP.   Patient still has significant dyspnea. She is using oxygen at all times.  She has stable dyspnea after walking 50-100 feet. She gets very short of breath pushing her trashcans to the street.  No chest pain.  Symptoms are stable.  Her lower extremity edema is still present but is improved on a higher Lasix dose.  She saw Dr. Craige Cotta recently and had PFTs, showing moderate to severe obstructive lung disease.  Echo in 6/13 showed preserved LV EF with mild RV systolic dysfunction and dilation. PA systolic pressure was 61 mmHg.  Aortic stenosis had progressed to moderate range.  Since that time, her oxygen has been increased.   Labs (12/10): creatinine 0.8, BNP 73, TSH normal, HCT 42.4, ANA negative  Labs (1/11): K 4.4, creatinine 0.8  Labs (6/11): K 3.7, creatinine 0.9, BNP 37, HDL 38, TGs 213, LDL 137  Labs (11/11): LDL 87, HDL 39, LFTs normal  Labs 16/10): BNP 12  Labs (6/13): K 3.9, creatinine 0.7, proBNP 55, TSH normal, LDL 68, HDL 40 Labs (8/13): K 3.9, creatinine 0.8  Allergies (verified):  1) ! Codeine   Past Medical History:  1. GERD  2. Hyperlipidemia  3.  Hypertension  4. CAD: MI 2002 with PCI to proximal and distal RCA. Adenosine myoview (4/09) with EF 72%, no ischemia or infarction.  5. Diabetes mellitus, type II  6. CHF: Echo (4/09) showed EF 60-65% with mild diastolic dysfunction. Echo (12/10) showed mild LVH, EF 60-65%, mild AS (mean gradient 13 mmHg), mildly dilated RV with mild to moderate RV systolic dysfunction, and PASP 59 mmHg (moderate pulmonary hypertension). Echo (11/11): EF 60-65%, mild aortic stenosis with mean gradient 12 mmHg, grade I diastolic dysfunction, normal RV size and systolic function, unable to estimate PA systolic pressure.  Echo (6/13) with EF 60-65%, mild LVH, moderate AS (mean gradient 25 mmHg), mildly dilated RV with mild RV systolic dysfunction, PA systolic pressure 61 mmHg.  7. COPD: Has quit smoking.  - PFT 06/13/09 FEV1 1.97(76%), FEV1% 73, TLC 5.35(101%), DLCO 65%  8. OSA/obesity-hypoventilation  - PSG 06/19/09 AHI 13  - ABG 06/13/09 7.44/50.6/55 on room air  - intolerant of CPAP/BPAP  9. 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 RHC (1/11) showed mean RA pressure 15, PA 57/42 mean 41 on room air and PA 43/24 mean 32 on nonrebreather mask, PCWP 15, SVO2 51%, CI 1.9. ANA was negative and V/Q scan did not show evidence for chronic PE. RV appeared improved on 11/11 echo.  PA systolic pressure 61 mmHg on 6/13 echo (oxygen was increased).  10. Moderate aortic stenosis.   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  pt has no children.  pt started smoking at age 84s. 1 1/2 ppd for years but quit 1/11 after using Chantix.   ROS: All systems  reviewed and negative except as per HPI.    Current Outpatient Prescriptions  Medication Sig Dispense Refill  . albuterol (PROAIR HFA) 108 (90 BASE) MCG/ACT inhaler Inhale 2 puffs into the lungs every 6 (six) hours as needed for wheezing.  1 Inhaler  3  . ALPRAZolam (XANAX) 0.5 MG tablet Take 1 tablet (0.5 mg total) by mouth 3 (three) times daily as needed for anxiety.  90 tablet  5  . aspirin EC 81 MG tablet Take 1 tablet (81 mg total) by mouth daily.      . budesonide-formoterol (SYMBICORT) 160-4.5 MCG/ACT inhaler Inhale 2 puffs into the lungs 2 (two) times daily.  1 Inhaler  6  . CRESTOR 40 MG tablet TAKE 1 TABLET BY MOUTH DAILY  30 tablet  11  . furosemide (LASIX) 40 MG tablet Take 2 tablets (80 mg total) by mouth 2 (two) times daily.  120 tablet  11  . gabapentin (NEURONTIN) 300 MG capsule Take 1 capsule (300 mg total) by mouth 2 (two) times daily.  60 capsule  5  . glipiZIDE (GLUCOTROL) 10 MG tablet TAKE 1 TABLET BY MOUTH 2 TIMES A DAY  60 tablet  11  . glucose blood (ACCU-CHEK AVIVA) test strip 1 each by Other route 2 (two) times daily.  100 each  11  . Guaifenesin 1200 MG TB12 Take 1 tablet by mouth every 12 (twelve) hours as needed.      . Hydrocodone-Acetaminophen 10-660 MG TABS Take 1 tablet by mouth every 6 (six) hours as needed (pain).  60 each  5  . metFORMIN (GLUCOPHAGE) 1000 MG tablet TAKE 1 TABLET (1,000 MG TOTAL) BY MOUTH 2 (TWO) TIMES DAILY.  60 tablet  11  . NEOMYCIN-POLYMYXIN-HC, OPHTH, SUSP Apply 2 drops to eye 4 (four) times daily as needed.      . NON FORMULARY Oxygen 3L       . omeprazole (PRILOSEC) 40 MG capsule TAKE 1 CAPSULE EVERY DAY  30 capsule  11  . potassium chloride SA (KLOR-CON M20) 20 MEQ tablet Take 1 tablet (20 mEq total) by mouth 2 (two) times daily.  60 tablet  11  . solifenacin (VESICARE) 5 MG tablet Take 10 mg by mouth daily.      Marland Kitchen spironolactone (ALDACTONE) 25 MG tablet TAKE 1 TABLET BY MOUTH DAILY  30 tablet  11  . tiotropium (SPIRIVA) 18 MCG  inhalation capsule Place 1 capsule (18 mcg total) into inhaler and inhale daily.  30 capsule  5  . DISCONTD: budesonide-formoterol (SYMBICORT) 160-4.5 MCG/ACT inhaler Inhale 2 puffs into the lungs 2 (two) times daily.  1 Inhaler  12    BP 110/64  Pulse 72  Ht 5\' 6"  (1.676 m)  Wt 206 lb (93.441 kg)  BMI 33.25 kg/m2 General: NAD, obese Neck: Thick, JVP not elevated, no thyromegaly or thyroid nodule.  Lungs: Slight crackles at the bases with prolonged expiratory phase.  CV: Nondisplaced PMI.  Heart regular S1/S2, no S3/S4, 2/6 early SEM RUSB.  1+ edema 1/3 to knees bilaterally.  No carotid bruit.  Normal pedal pulses.  Abdomen: Soft, nontender, no hepatosplenomegaly, no distention.  Skin: Erythema anterior right lower leg.  Neurologic: Alert and oriented x 3.  Psych: Normal affect. Extremities: No clubbing or cyanosis.

## 2012-01-14 NOTE — Assessment & Plan Note (Signed)
Moderate to severe COPD by PFTs.  Followed by Dr. Craige Cotta.

## 2012-01-14 NOTE — Assessment & Plan Note (Signed)
Stable dyspneic symptoms that I think are mainly due to COPD and OSA/OHS.  Continue ASA 81 and statin.  Would avoid beta blocker with moderate to severe COPD.

## 2012-01-14 NOTE — Assessment & Plan Note (Signed)
OHS/OSA.  She cannot tolerate CPAP and will not try it again.  She wears home oxygen, and this was recently increased.

## 2012-01-14 NOTE — Patient Instructions (Addendum)
Your physician wants you to follow-up in: 6 months with Dr McLean.(February 2014) You will receive a reminder letter in the mail two months in advance. If you don't receive a letter, please call our office to schedule the follow-up appointment.  

## 2012-02-01 ENCOUNTER — Emergency Department (HOSPITAL_COMMUNITY): Payer: Medicare Other

## 2012-02-01 ENCOUNTER — Inpatient Hospital Stay (HOSPITAL_COMMUNITY): Payer: Medicare Other

## 2012-02-01 ENCOUNTER — Inpatient Hospital Stay (HOSPITAL_COMMUNITY)
Admission: EM | Admit: 2012-02-01 | Discharge: 2012-02-07 | DRG: 070 | Disposition: A | Discharge status: Home Health | Payer: Medicare Other | Attending: Critical Care Medicine | Admitting: Critical Care Medicine

## 2012-02-01 ENCOUNTER — Ambulatory Visit: Payer: Medicare Other | Admitting: Family Medicine

## 2012-02-01 ENCOUNTER — Telehealth: Payer: Self-pay | Admitting: Family Medicine

## 2012-02-01 ENCOUNTER — Encounter (HOSPITAL_COMMUNITY): Payer: Self-pay | Admitting: Emergency Medicine

## 2012-02-01 DIAGNOSIS — J449 Chronic obstructive pulmonary disease, unspecified: Secondary | ICD-10-CM

## 2012-02-01 DIAGNOSIS — Z7982 Long term (current) use of aspirin: Secondary | ICD-10-CM

## 2012-02-01 DIAGNOSIS — T40605A Adverse effect of unspecified narcotics, initial encounter: Secondary | ICD-10-CM | POA: Diagnosis present

## 2012-02-01 DIAGNOSIS — G9341 Metabolic encephalopathy: Principal | ICD-10-CM | POA: Diagnosis present

## 2012-02-01 DIAGNOSIS — I10 Essential (primary) hypertension: Secondary | ICD-10-CM | POA: Diagnosis present

## 2012-02-01 DIAGNOSIS — R4182 Altered mental status, unspecified: Secondary | ICD-10-CM

## 2012-02-01 DIAGNOSIS — R109 Unspecified abdominal pain: Secondary | ICD-10-CM

## 2012-02-01 DIAGNOSIS — N390 Urinary tract infection, site not specified: Secondary | ICD-10-CM | POA: Diagnosis present

## 2012-02-01 DIAGNOSIS — R911 Solitary pulmonary nodule: Secondary | ICD-10-CM | POA: Diagnosis present

## 2012-02-01 DIAGNOSIS — E662 Morbid (severe) obesity with alveolar hypoventilation: Secondary | ICD-10-CM

## 2012-02-01 DIAGNOSIS — J9600 Acute respiratory failure, unspecified whether with hypoxia or hypercapnia: Secondary | ICD-10-CM | POA: Diagnosis present

## 2012-02-01 DIAGNOSIS — R590 Localized enlarged lymph nodes: Secondary | ICD-10-CM

## 2012-02-01 DIAGNOSIS — N289 Disorder of kidney and ureter, unspecified: Secondary | ICD-10-CM

## 2012-02-01 DIAGNOSIS — I2510 Atherosclerotic heart disease of native coronary artery without angina pectoris: Secondary | ICD-10-CM | POA: Diagnosis present

## 2012-02-01 DIAGNOSIS — R7989 Other specified abnormal findings of blood chemistry: Secondary | ICD-10-CM | POA: Diagnosis present

## 2012-02-01 DIAGNOSIS — G4733 Obstructive sleep apnea (adult) (pediatric): Secondary | ICD-10-CM | POA: Diagnosis present

## 2012-02-01 DIAGNOSIS — G609 Hereditary and idiopathic neuropathy, unspecified: Secondary | ICD-10-CM | POA: Diagnosis present

## 2012-02-01 DIAGNOSIS — E860 Dehydration: Secondary | ICD-10-CM | POA: Diagnosis present

## 2012-02-01 DIAGNOSIS — R748 Abnormal levels of other serum enzymes: Secondary | ICD-10-CM

## 2012-02-01 DIAGNOSIS — J4489 Other specified chronic obstructive pulmonary disease: Secondary | ICD-10-CM | POA: Diagnosis present

## 2012-02-01 DIAGNOSIS — F411 Generalized anxiety disorder: Secondary | ICD-10-CM

## 2012-02-01 DIAGNOSIS — K219 Gastro-esophageal reflux disease without esophagitis: Secondary | ICD-10-CM | POA: Diagnosis present

## 2012-02-01 DIAGNOSIS — R609 Edema, unspecified: Secondary | ICD-10-CM | POA: Diagnosis present

## 2012-02-01 DIAGNOSIS — M1990 Unspecified osteoarthritis, unspecified site: Secondary | ICD-10-CM

## 2012-02-01 DIAGNOSIS — E785 Hyperlipidemia, unspecified: Secondary | ICD-10-CM

## 2012-02-01 DIAGNOSIS — T424X5A Adverse effect of benzodiazepines, initial encounter: Secondary | ICD-10-CM | POA: Diagnosis present

## 2012-02-01 DIAGNOSIS — E119 Type 2 diabetes mellitus without complications: Secondary | ICD-10-CM | POA: Diagnosis present

## 2012-02-01 DIAGNOSIS — I509 Heart failure, unspecified: Secondary | ICD-10-CM

## 2012-02-01 DIAGNOSIS — I350 Nonrheumatic aortic (valve) stenosis: Secondary | ICD-10-CM | POA: Diagnosis present

## 2012-02-01 DIAGNOSIS — N179 Acute kidney failure, unspecified: Secondary | ICD-10-CM | POA: Diagnosis present

## 2012-02-01 DIAGNOSIS — N170 Acute kidney failure with tubular necrosis: Secondary | ICD-10-CM | POA: Diagnosis present

## 2012-02-01 DIAGNOSIS — I359 Nonrheumatic aortic valve disorder, unspecified: Secondary | ICD-10-CM | POA: Diagnosis present

## 2012-02-01 DIAGNOSIS — G9340 Encephalopathy, unspecified: Secondary | ICD-10-CM | POA: Diagnosis present

## 2012-02-01 DIAGNOSIS — J9620 Acute and chronic respiratory failure, unspecified whether with hypoxia or hypercapnia: Secondary | ICD-10-CM | POA: Diagnosis present

## 2012-02-01 DIAGNOSIS — E1169 Type 2 diabetes mellitus with other specified complication: Secondary | ICD-10-CM | POA: Diagnosis not present

## 2012-02-01 DIAGNOSIS — Z79899 Other long term (current) drug therapy: Secondary | ICD-10-CM

## 2012-02-01 DIAGNOSIS — I2789 Other specified pulmonary heart diseases: Secondary | ICD-10-CM | POA: Diagnosis present

## 2012-02-01 DIAGNOSIS — I272 Pulmonary hypertension, unspecified: Secondary | ICD-10-CM | POA: Diagnosis present

## 2012-02-01 DIAGNOSIS — I5033 Acute on chronic diastolic (congestive) heart failure: Secondary | ICD-10-CM | POA: Diagnosis present

## 2012-02-01 DIAGNOSIS — Z87891 Personal history of nicotine dependence: Secondary | ICD-10-CM

## 2012-02-01 DIAGNOSIS — A498 Other bacterial infections of unspecified site: Secondary | ICD-10-CM | POA: Diagnosis present

## 2012-02-01 DIAGNOSIS — I214 Non-ST elevation (NSTEMI) myocardial infarction: Secondary | ICD-10-CM | POA: Diagnosis present

## 2012-02-01 DIAGNOSIS — I4510 Unspecified right bundle-branch block: Secondary | ICD-10-CM | POA: Diagnosis present

## 2012-02-01 DIAGNOSIS — R0689 Other abnormalities of breathing: Secondary | ICD-10-CM | POA: Diagnosis present

## 2012-02-01 HISTORY — DX: Obesity, class 1: E66.811

## 2012-02-01 HISTORY — DX: Obesity, unspecified: E66.9

## 2012-02-01 LAB — COMPREHENSIVE METABOLIC PANEL
Albumin: 3.3 g/dL — ABNORMAL LOW (ref 3.5–5.2)
Alkaline Phosphatase: 82 U/L (ref 39–117)
BUN: 43 mg/dL — ABNORMAL HIGH (ref 6–23)
CO2: 34 mEq/L — ABNORMAL HIGH (ref 19–32)
Chloride: 85 mEq/L — ABNORMAL LOW (ref 96–112)
Creatinine, Ser: 2.29 mg/dL — ABNORMAL HIGH (ref 0.50–1.10)
GFR calc non Af Amer: 21 mL/min — ABNORMAL LOW (ref >90)
Glucose, Bld: 81 mg/dL (ref 70–99)
Potassium: 4 mEq/L (ref 3.5–5.1)
Total Bilirubin: 0.5 mg/dL (ref 0.3–1.2)

## 2012-02-01 LAB — ETHANOL: Alcohol, Ethyl (B): <11 mg/dL (ref 0–11)

## 2012-02-01 LAB — URINALYSIS, ROUTINE W REFLEX MICROSCOPIC
Glucose, UA: NEGATIVE mg/dL
Ketones, ur: 15 mg/dL — AB
Nitrite: NEGATIVE
Specific Gravity, Urine: 1.018 (ref 1.005–1.030)
pH: 5.5 (ref 5.0–8.0)

## 2012-02-01 LAB — CBC WITH DIFFERENTIAL/PLATELET
Basophils Relative: 0 % (ref 0–1)
Eosinophils Absolute: 0 10*3/uL (ref 0.0–0.7)
Eosinophils Relative: 0 % (ref 0–5)
Hemoglobin: 11.8 g/dL — ABNORMAL LOW (ref 12.0–15.0)
Lymphs Abs: 1.6 10*3/uL (ref 0.7–4.0)
MCH: 21.8 pg — ABNORMAL LOW (ref 26.0–34.0)
MCHC: 29.5 g/dL — ABNORMAL LOW (ref 30.0–36.0)
MCV: 73.8 fL — ABNORMAL LOW (ref 78.0–100.0)
Monocytes Absolute: 1 10*3/uL (ref 0.1–1.0)
Neutro Abs: 7.3 10*3/uL (ref 1.7–7.7)
Neutrophils Relative %: 74 % (ref 43–77)
RBC: 5.42 MIL/uL — ABNORMAL HIGH (ref 3.87–5.11)

## 2012-02-01 LAB — POCT I-STAT 3, ART BLOOD GAS (G3+)
Acid-Base Excess: 9 mmol/L — ABNORMAL HIGH (ref 0.0–2.0)
Bicarbonate: 37.5 mEq/L — ABNORMAL HIGH (ref 20.0–24.0)
O2 Saturation: 92 %
TCO2: 39 mmol/L (ref 0–100)
pCO2 arterial: 81.1 mmHg (ref 35.0–45.0)
pH, Arterial: 7.284 — ABNORMAL LOW (ref 7.350–7.450)
pO2, Arterial: 69 mmHg — ABNORMAL LOW (ref 80.0–100.0)

## 2012-02-01 LAB — CK TOTAL AND CKMB (NOT AT ARMC)
CK, MB: 33.1 ng/mL (ref 0.3–4.0)
Relative Index: 0.6 (ref 0.0–2.5)

## 2012-02-01 LAB — GLUCOSE, CAPILLARY: Glucose-Capillary: 133 mg/dL — ABNORMAL HIGH (ref 70–99)

## 2012-02-01 LAB — PROTIME-INR
INR: 1.24 (ref 0.00–1.49)
Prothrombin Time: 15.9 seconds — ABNORMAL HIGH (ref 11.6–15.2)

## 2012-02-01 LAB — RAPID URINE DRUG SCREEN, HOSP PERFORMED
Amphetamines: NOT DETECTED
Barbiturates: NOT DETECTED
Benzodiazepines: POSITIVE — AB

## 2012-02-01 LAB — TROPONIN I: Troponin I: 1.62 ng/mL (ref <0.30)

## 2012-02-01 LAB — URINE MICROSCOPIC-ADD ON

## 2012-02-01 MED ORDER — ALBUTEROL SULFATE (5 MG/ML) 0.5% IN NEBU
2.5000 mg | INHALATION_SOLUTION | RESPIRATORY_TRACT | Status: DC
  Administered 2012-02-02 – 2012-02-03 (×9): 2.5 mg via RESPIRATORY_TRACT
  Filled 2012-02-01 (×9): qty 0.5

## 2012-02-01 MED ORDER — ACETAMINOPHEN 325 MG PO TABS
650.0000 mg | ORAL_TABLET | Freq: Four times a day (QID) | ORAL | Status: DC | PRN
  Administered 2012-02-05: 650 mg via ORAL
  Filled 2012-02-01: qty 2

## 2012-02-01 MED ORDER — INSULIN ASPART 100 UNIT/ML ~~LOC~~ SOLN
0.0000 [IU] | Freq: Three times a day (TID) | SUBCUTANEOUS | Status: DC
Start: 2012-02-02 — End: 2012-02-02
  Administered 2012-02-02: 5 [IU] via SUBCUTANEOUS

## 2012-02-01 MED ORDER — INSULIN ASPART 100 UNIT/ML ~~LOC~~ SOLN: 0.0000 [IU] | Freq: Every day | SUBCUTANEOUS | Status: DC

## 2012-02-01 MED ORDER — SODIUM CHLORIDE 0.9 % IV SOLN
INTRAVENOUS | Status: DC
  Administered 2012-02-01: 17:00:00 via INTRAVENOUS

## 2012-02-01 MED ORDER — ASPIRIN EC 81 MG PO TBEC
81.0000 mg | DELAYED_RELEASE_TABLET | Freq: Every day | ORAL | Status: DC
  Administered 2012-02-02 – 2012-02-07 (×6): 81 mg via ORAL
  Filled 2012-02-01 (×6): qty 1

## 2012-02-01 MED ORDER — POLYVINYL ALCOHOL 1.4 % OP SOLN
1.0000 [drp] | OPHTHALMIC | Status: DC | PRN
  Filled 2012-02-01: qty 15

## 2012-02-01 MED ORDER — FLUMAZENIL 0.5 MG/5ML IV SOLN
INTRAVENOUS | Status: AC
  Administered 2012-02-01: 0.4 mg via INTRAVENOUS
  Filled 2012-02-01: qty 5

## 2012-02-01 MED ORDER — METHYLPREDNISOLONE SODIUM SUCC 125 MG IJ SOLR
60.0000 mg | Freq: Every day | INTRAMUSCULAR | Status: DC
  Administered 2012-02-02: 60 mg via INTRAVENOUS
  Filled 2012-02-01: qty 2
  Filled 2012-02-01: qty 0.96

## 2012-02-01 MED ORDER — ONDANSETRON HCL 4 MG/2ML IJ SOLN: 4.0000 mg | Freq: Four times a day (QID) | INTRAMUSCULAR | Status: DC | PRN

## 2012-02-01 MED ORDER — BUDESONIDE-FORMOTEROL FUMARATE 160-4.5 MCG/ACT IN AERO: 2.0000 | INHALATION_SPRAY | Freq: Two times a day (BID) | RESPIRATORY_TRACT | Status: DC

## 2012-02-01 MED ORDER — ATORVASTATIN CALCIUM 80 MG PO TABS
80.0000 mg | ORAL_TABLET | Freq: Every day | ORAL | Status: DC
  Administered 2012-02-02 – 2012-02-06 (×4): 80 mg via ORAL
  Filled 2012-02-01 (×6): qty 1

## 2012-02-01 MED ORDER — ALBUTEROL SULFATE (5 MG/ML) 0.5% IN NEBU: 2.5000 mg | INHALATION_SOLUTION | Freq: Four times a day (QID) | RESPIRATORY_TRACT | Status: DC

## 2012-02-01 MED ORDER — ONDANSETRON HCL 4 MG PO TABS: 4.0000 mg | ORAL_TABLET | Freq: Four times a day (QID) | ORAL | Status: DC | PRN

## 2012-02-01 MED ORDER — HEPARIN BOLUS VIA INFUSION
4000.0000 [IU] | Freq: Once | INTRAVENOUS | Status: AC
  Administered 2012-02-01: 4000 [IU] via INTRAVENOUS
  Filled 2012-02-01: qty 4000

## 2012-02-01 MED ORDER — NALOXONE HCL 0.4 MG/ML IJ SOLN
0.2000 mg | INTRAMUSCULAR | Status: AC
  Administered 2012-02-01: 0.2 mg via INTRAVENOUS
  Filled 2012-02-01: qty 1

## 2012-02-01 MED ORDER — ASPIRIN 81 MG PO CHEW: 324.0000 mg | CHEWABLE_TABLET | ORAL | Status: AC

## 2012-02-01 MED ORDER — NEOMYCIN-POLYMYXIN-HC OP SUSP: 2.0000 [drp] | Freq: Four times a day (QID) | OPHTHALMIC | Status: DC | PRN

## 2012-02-01 MED ORDER — HEPARIN (PORCINE) IN NACL 100-0.45 UNIT/ML-% IJ SOLN
1400.0000 [IU]/h | INTRAMUSCULAR | Status: DC
  Administered 2012-02-02: 1400 [IU]/h via INTRAVENOUS
  Filled 2012-02-01 (×3): qty 250

## 2012-02-01 MED ORDER — BUDESONIDE-FORMOTEROL FUMARATE 160-4.5 MCG/ACT IN AERO
2.0000 | INHALATION_SPRAY | Freq: Two times a day (BID) | RESPIRATORY_TRACT | Status: DC
  Administered 2012-02-02: 2 via RESPIRATORY_TRACT
  Filled 2012-02-01: qty 6

## 2012-02-01 MED ORDER — ALBUTEROL SULFATE (5 MG/ML) 0.5% IN NEBU: 2.5000 mg | INHALATION_SOLUTION | RESPIRATORY_TRACT | Status: DC | PRN

## 2012-02-01 MED ORDER — ACETAMINOPHEN 650 MG RE SUPP: 650.0000 mg | Freq: Four times a day (QID) | RECTAL | Status: DC | PRN

## 2012-02-01 MED ORDER — SODIUM CHLORIDE 0.9 % IV SOLN
INTRAVENOUS | Status: AC
  Administered 2012-02-01: 100 mL/h via INTRAVENOUS
  Administered 2012-02-02: 08:00:00 via INTRAVENOUS

## 2012-02-01 MED ORDER — DEXTROSE 5 % IV SOLN
1.0000 g | Freq: Once | INTRAVENOUS | Status: AC
  Administered 2012-02-01: 1 g via INTRAVENOUS
  Filled 2012-02-01: qty 10

## 2012-02-01 MED ORDER — ASPIRIN 300 MG RE SUPP
300.0000 mg | RECTAL | Status: AC
  Administered 2012-02-02: 300 mg via RECTAL
  Filled 2012-02-01: qty 1

## 2012-02-01 MED ORDER — IPRATROPIUM BROMIDE 0.02 % IN SOLN
0.5000 mg | RESPIRATORY_TRACT | Status: DC
  Administered 2012-02-02 – 2012-02-03 (×9): 0.5 mg via RESPIRATORY_TRACT
  Filled 2012-02-01 (×9): qty 2.5

## 2012-02-01 MED ORDER — ASPIRIN EC 325 MG PO TBEC
325.0000 mg | DELAYED_RELEASE_TABLET | Freq: Every day | ORAL | Status: DC
  Filled 2012-02-01: qty 1

## 2012-02-01 MED ORDER — DEXTROSE 5 % IV SOLN
2.0000 g | Freq: Every day | INTRAVENOUS | Status: DC
  Administered 2012-02-02: 2 g via INTRAVENOUS
  Filled 2012-02-01 (×2): qty 2

## 2012-02-01 MED ORDER — FLUMAZENIL 0.5 MG/5ML IV SOLN
0.4000 mg | Freq: Once | INTRAVENOUS | Status: AC
  Administered 2012-02-01: 0.4 mg via INTRAVENOUS

## 2012-02-01 MED ORDER — PANTOPRAZOLE SODIUM 40 MG IV SOLR
40.0000 mg | Freq: Every day | INTRAVENOUS | Status: DC
  Administered 2012-02-02 – 2012-02-03 (×2): 40 mg via INTRAVENOUS
  Filled 2012-02-01 (×2): qty 40

## 2012-02-01 MED ORDER — DEXTROSE 5 % IV SOLN
500.0000 mg | Freq: Every day | INTRAVENOUS | Status: DC
  Administered 2012-02-02 (×2): 500 mg via INTRAVENOUS
  Filled 2012-02-01 (×3): qty 500

## 2012-02-01 MED ORDER — SODIUM CHLORIDE 0.9 % IV SOLN: 250.0000 mL | INTRAVENOUS | Status: DC | PRN

## 2012-02-01 NOTE — ED Notes (Signed)
Patient is resting comfortably. Patient does want her friend at bedside.

## 2012-02-01 NOTE — ED Notes (Signed)
Pr DR. Tat, he is okay with sats above 88%, pt will arouse and answers questions.

## 2012-02-01 NOTE — ED Notes (Addendum)
[facility] physician at bedside

## 2012-02-01 NOTE — ED Notes (Signed)
Notified Cardiologist that pt has not had repeat ABG since 1604 pm, pt on Bipap, still lethargic, Notified RT to come obtain stat repeat ABG per Dr. Arbutus Leas and Cardiologist.

## 2012-02-01 NOTE — Telephone Encounter (Signed)
Noted. It looks to me like she is being well cared for.

## 2012-02-01 NOTE — ED Notes (Signed)
Dr. Arbutus Leas notified of pt's ABG results.

## 2012-02-01 NOTE — ED Notes (Signed)
Per EMS: pt from PCP office parking lot; pt c/o generalized weakness with fatigue and generalized body aches x 1 week; pt with some trouble with walking due to weakness; pt sts was too tired to go to bed last night so slept at kitchen table; pt on home O2; pt noted to have white powder around mouth

## 2012-02-01 NOTE — ED Notes (Signed)
Pt is on BiPap and her oxygen saturation has increased to 98%

## 2012-02-01 NOTE — H&P (Signed)
[facility] History and Physical  Jillian Bright QMV:[id number] DOB: [birth year] DOA: 02/01/2012   PCP: Nelwyn Salisbury, MD   Chief Complaint: Generalized weakness and fatigue  HPI:  66 year old female with multiple medical problems including hypertension, coronary artery disease, diabetes mellitus type 2, and COPD/OHS presents with one-week history of progressive fatigue and weakness. The patient states that she was so weak to walk back to her bed yesterday, that she just slipped on the table. This morning, the patient was so weak that she cannot get up. Her friend came to visit her at the house. She was taken to her primary care physician who suggested that she be admitted to the hospital. The patient is currently encephalopathic, and her history is limited at this time. According to the nurse, the patient was more awake at the time of arrival than at the time of my examination. However, the patient does wake up to voice and does answer simple questions and follow one-step commands. Unfortunately she is unable to provide any detailed history. Currently, she denies any shortness breath, chest pain, abdominal pain, headache, vision loss. The patient has a diagnosis of COPD on supplemental oxygen at home. She tells me that she is on 4 L nasal cannula supplemental oxygen. As I was examining the patient, the patient will follow sleep. However she does wake up to voice to questions and my examination. Oxygen saturations fluctuated between 86 and 92% on 4 L nasal cannula. An ABG has been ordered-- results show 7.28/77/55/37 on 4 L nasal cannula. The patient does wake up accommodation has some left wrist pain as well as bilateral leg pain. However she cannot, the duration or the quality of the pain. She denies any recent trauma. Assessment/Plan: Acute hypercarbic and hypoxemic respiratory failure -I suspect that this is partly due to her opioid and benzodiazepine use which has resulted in  hypersomnolence -Her encephalopathy is multifactorial and is partly contributing to her respiratory failure -Chest x-ray is negative for any acute infiltrate or pulmonary edema or effusion -place on BiPaP, repeat ABG after one hour Acute encephalopathy -This is likely multifactorial due partly to presumptive UTI, medication effect (opiates, benzos), dehydration, AKI, possible sepsis -Blood cultures and start empiric ceftriaxone -TSH, hold  opioids and benzodiazepines Right bundle-branch block/elevated troponin -Right bundle-branch block does not appear to be new, it was present in December 2010 -Elevated troponin may be due to sepsis and demand ischemia as the patient currently has no chest discomfort -[facility] has been called Acute kidney injury -Likely a combination of ATN from possible sepsis and prerenal azotemia -Baseline creatinine is between 0.8 and 1.1 -abdominal u/s for AKI and elevated LFTs -start NS infusion COPD/OHS -Patient likely has cor pulmonale given most recent echo findings -Most recent PFTs reveal moderate to severe COPD -Continue home medications including Symbicort and Spiriva Diabetes mellitus type 2 -Hold metformin at this time. Hold glipizide -Check hemoglobin A1c, continue NovoLog sliding scale Leg pain -Venous Doppler to rule out DVT      Past Medical History  Diagnosis Date  . Diabetes mellitus   . COPD (chronic obstructive pulmonary disease)     sees Dr. Coralyn Helling  . Sleep apnea, obstructive   . Pulmonary HTN    Past Surgical History  Procedure Date  . Appendectomy   . Tonsillectomy and adenoidectomy   . Lumbar disc surgery   . Hemorrhoid surgery    Social History:  reports that she quit smoking about 2 years ago. Her smoking use included  Cigarettes. She has a 72 pack-year smoking history. She has never used smokeless tobacco. She reports that she drinks alcohol. She reports that she does not use illicit drugs.  Allergies   Allergen Reactions  . Codeine     History reviewed. No pertinent family history.  Prior to Admission medications   Medication Sig Start Date End Date Taking? Authorizing Provider  albuterol (PROVENTIL HFA;VENTOLIN HFA) 108 (90 BASE) MCG/ACT inhaler Inhale 2 puffs into the lungs every 6 (six) hours as needed. For wheezing   Yes Historical Provider, MD  ALPRAZolam Prudy Feeler) 0.5 MG tablet Take 0.5 mg by mouth 3 (three) times daily as needed. For anxiety   Yes Historical Provider, MD  aspirin EC 81 MG tablet Take 1 tablet (81 mg total) by mouth daily. 11/07/11  Yes [name], MD  budesonide-formoterol [facility]) 160-4.5 MCG/ACT inhaler Inhale 2 puffs into the lungs 2 (two) times daily.   Yes Historical Provider, MD  furosemide (LASIX) 40 MG tablet Take 80 mg by mouth 2 (two) times daily.   Yes Historical Provider, MD  gabapentin (NEURONTIN) 300 MG capsule Take 300 mg by mouth 2 (two) times daily.   Yes Historical Provider, MD  glipiZIDE (GLUCOTROL) 10 MG tablet Take 10 mg by mouth 2 (two) times daily before a meal.   Yes Historical Provider, MD  guaiFENesin (MUCINEX) 600 MG 12 hr tablet Take 1,200 mg by mouth 2 (two) times daily as needed. For congestion   Yes Historical Provider, MD  Hydrocodone-Acetaminophen 10-660 MG TABS Take 1 tablet by mouth every 6 (six) hours as needed. For pain   Yes Historical Provider, MD  metFORMIN (GLUCOPHAGE) 1000 MG tablet Take 1,000 mg by mouth 2 (two) times daily with a meal.   Yes Historical Provider, MD  NEOMYCIN-POLYMYXIN-HC, OPHTH, SUSP Apply 2 drops to eye 4 (four) times daily as needed. For infection 07/20/11  Yes Nelwyn Salisbury, MD  omeprazole (PRILOSEC) 40 MG capsule Take 40 mg by mouth daily.   Yes Historical Provider, MD  potassium chloride SA (K-DUR,KLOR-CON) 20 MEQ tablet Take 20 mEq by mouth 2 (two) times daily.   Yes Historical Provider, MD  rosuvastatin (CRESTOR) 40 MG tablet Take 40 mg by mouth daily.   Yes Historical Provider, MD  solifenacin  (VESICARE) 5 MG tablet Take 10 mg by mouth daily.   Yes Historical Provider, MD  spironolactone (ALDACTONE) 25 MG tablet Take 25 mg by mouth daily.   Yes Historical Provider, MD  tiotropium (SPIRIVA) 18 MCG inhalation capsule Place 18 mcg into inhaler and inhale daily.   Yes Historical Provider, MD    Review of Systems: Limited due to the patient's encephalopathy Constitutional:  No Fevers, chills.  Head&Eyes: No headache.  No vision loss.    No chest pain,  GI:  No  abdominal pain, nausea, vomiting, diarrhea Resp:  No shortness of breath with exertion or at rest.  Skin:  no rash or lesions.  GU:  no dysuria,  Musculoskeletal:  Bilateral leg pain and left wrist pain  No headache,   Physical Exam: Filed Vitals:   02/01/12 1445 02/01/12 1500 02/01/12 1515 02/01/12 1531  BP: 117/74 96/66 94/66    Pulse: 93 91 89   Temp:      TempSrc:      Resp:      SpO2: 89% 89% 88% 90%   General:  A&O x 2, NAD, nontoxic, Head/Eye: No conjunctival hemorrhage, no icterus, [place]/AT, No nystagmus ENT:  No icterus,  No thrush, no  pharyngeal exudate Neck:  No masses, no lymphadenpathy, no bruits CV:  RRR, no rub, no gallop, no S3 Lung:  Bilateral basilar crackles. Scattered wheezes. Good air movement. Abdomen: soft/NT, +BS, nondistended, no peritoneal signs Ext: No cyanosis, No rashes, No petechiae, No lymphangitis, 1+ edema bilateral lower extremities Neuro: Patient withdraws to protopathic stimuli. She is able to move all 4 extremities spontaneously antigravity.  Labs on Admission:  Basic Metabolic Panel:  Lab 02/01/12 1610  NA 131*  K 4.0  CL 85*  CO2 34*  GLUCOSE 81  BUN 43*  CREATININE 2.29*  CALCIUM 9.2  MG --  PHOS --   Liver Function Tests:  Lab 02/01/12 1335  AST 240*  ALT 176*  ALKPHOS 82  BILITOT 0.5  PROT 7.4  ALBUMIN 3.3*   No results found for this basename: LIPASE:5,AMYLASE:5 in the last 168 hours No results found for this basename: AMMONIA:5 in the last 168  hours CBC:  Lab 02/01/12 1335  WBC 9.9  NEUTROABS 7.3  HGB 11.8*  HCT 40.0  MCV 73.8*  PLT 195   Cardiac Enzymes:  Lab 02/01/12 1326  CKTOTAL --  CKMB --  CKMBINDEX --  TROPONINI 1.62*   BNP: No components found with this basename: POCBNP:5 CBG: No results found for this basename: GLUCAP:5 in the last 168 hours  Radiological Exams on Admission: Dg Chest 2 View  02/01/2012  *RADIOLOGY REPORT*  Clinical Data: Lethargy.  CHEST - 2 VIEW  Comparison: 11/26/2011  Findings: The cardiopericardial silhouette is enlarged. Interstitial markings are diffusely coarsened with chronic features.  Bibasilar atelectasis noted.  Bones are diffusely demineralized. Telemetry leads overlie the chest.  IMPRESSION: Cardiomegaly with underlying chronic interstitial coarsening and some basilar atelectasis.   Original Report Authenticated By: ERIC A. MANSELL, M.D.    Ct Head Wo Contrast  02/01/2012  *RADIOLOGY REPORT*  Clinical Data: Weakness and lethargy  CT HEAD WITHOUT CONTRAST  Technique:  Contiguous axial images were obtained from the base of the skull through the vertex without contrast.  Comparison: None.  Findings: Ventricle size is normal.  Negative for intracranial hemorrhage.  Negative for acute infarct or mass.  Hypodensity left parietal white matter may represent a chronic infarct.  Calvarium is intact.  Paranasal sinuses are clear.  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  No acute abnormality.   Original Report Authenticated By: Camelia Phenes, M.D.     EKG: Independently reviewed. Old RBBB    Time spend:70 minutes Code Status: full Family Communication: Family friend, Ann Maki, [phone]   Shalayna Ornstein, DO  [facility] Pager [phone]  If 7PM-7AM, please contact night-coverage [email or web address] Password [email or web address] 02/01/2012, 3:52 PM

## 2012-02-01 NOTE — ED Notes (Signed)
Dr. Arbutus Leas in room to see assess patient, turned oxygen down to 3 Liters, Patients sats while awake 90-92%

## 2012-02-01 NOTE — ED Provider Notes (Signed)
History     CSN: [id number]  Arrival date & time 02/01/12  1038   First MD Initiated Contact with Patient 02/01/12 1151      Chief Complaint  Patient presents with  . Weakness    (Consider location/radiation/quality/duration/timing/severity/associated sxs/prior treatment) The history is provided by the patient.   patient presents with generalized weakness and fatigue for the last week. She states she's been too weak to walk. She states she was too tired to go to bed so she slept a table last night. She states she saw her primary care doctor was worried she had a stroke and sent her in to the ER. No chest pain. She states she has pain in her neck. She states is worse when she moves. No nausea vomiting diarrhea. She appears mildly confused. She has some white powder on her lips. She states that it was wiped off and came back. She does not know what it is.  Past Medical History  Diagnosis Date  . Diabetes mellitus   . COPD (chronic obstructive pulmonary disease)     sees Dr. Coralyn Helling  . Sleep apnea, obstructive   . Pulmonary HTN     Past Surgical History  Procedure Date  . Appendectomy   . Tonsillectomy and adenoidectomy   . Lumbar disc surgery   . Hemorrhoid surgery     History reviewed. No pertinent family history.  History  Substance Use Topics  . Smoking status: Former Smoker -- 2.0 packs/day for 36 years    Types: Cigarettes    Quit date: 05/28/2009  . Smokeless tobacco: Never Used  . Alcohol Use: Yes     few times a year    OB History    Grav Para Term Preterm Abortions TAB SAB Ect Mult Living                  Review of Systems  Constitutional: Positive for fatigue. Negative for fever and chills.  HENT: Negative for nosebleeds.   Respiratory: Positive for shortness of breath.   Cardiovascular: Negative for chest pain and leg swelling.  Gastrointestinal: Negative for abdominal pain.  Genitourinary: Negative for frequency and flank pain.    Musculoskeletal: Negative for back pain.  Skin: Negative for rash and wound.  Neurological: Positive for weakness.  Psychiatric/Behavioral: Positive for confusion.    Allergies  Codeine  Home Medications   Current Outpatient Rx  Name Route Sig Dispense Refill  . ALBUTEROL SULFATE HFA 108 (90 BASE) MCG/ACT IN AERS Inhalation Inhale 2 puffs into the lungs every 6 (six) hours as needed. For wheezing    . ALPRAZOLAM 0.5 MG PO TABS Oral Take 0.5 mg by mouth 3 (three) times daily as needed. For anxiety    . ASPIRIN EC 81 MG PO TBEC Oral Take 1 tablet (81 mg total) by mouth daily.    . BUDESONIDE-FORMOTEROL FUMARATE 160-4.5 MCG/ACT IN AERO Inhalation Inhale 2 puffs into the lungs 2 (two) times daily.    . FUROSEMIDE 40 MG PO TABS Oral Take 80 mg by mouth 2 (two) times daily.    Marland Kitchen GABAPENTIN 300 MG PO CAPS Oral Take 300 mg by mouth 2 (two) times daily.    Marland Kitchen GLIPIZIDE 10 MG PO TABS Oral Take 10 mg by mouth 2 (two) times daily before a meal.    . GUAIFENESIN ER 600 MG PO TB12 Oral Take 1,200 mg by mouth 2 (two) times daily as needed. For congestion    . HYDROCODONE-ACETAMINOPHEN 10-660 MG PO  TABS Oral Take 1 tablet by mouth every 6 (six) hours as needed. For pain    . METFORMIN HCL 1000 MG PO TABS Oral Take 1,000 mg by mouth 2 (two) times daily with a meal.    . NEOMYCIN-POLYMYXIN-HC OP SUSP Ophthalmic Apply 2 drops to eye 4 (four) times daily as needed. For infection    . OMEPRAZOLE 40 MG PO CPDR Oral Take 40 mg by mouth daily.    Marland Kitchen POTASSIUM CHLORIDE CRYS ER 20 MEQ PO TBCR Oral Take 20 mEq by mouth 2 (two) times daily.    Marland Kitchen ROSUVASTATIN CALCIUM 40 MG PO TABS Oral Take 40 mg by mouth daily.    Marland Kitchen SOLIFENACIN SUCCINATE 5 MG PO TABS Oral Take 10 mg by mouth daily.    Marland Kitchen SPIRONOLACTONE 25 MG PO TABS Oral Take 25 mg by mouth daily.    Marland Kitchen TIOTROPIUM BROMIDE MONOHYDRATE 18 MCG IN CAPS Inhalation Place 18 mcg into inhaler and inhale daily.      BP 119/74  Pulse 92  Temp 98.5 F (36.9 C) (Oral)   Resp 13  SpO2 94%  Physical Exam  Constitutional: She appears well-developed.  HENT:  Head: Normocephalic.       White powder on lips.  Eyes: Pupils are equal, round, and reactive to light.  Neck:       No meningeal signs.  Cardiovascular: Normal rate and regular rhythm.   Pulmonary/Chest: Effort normal and breath sounds normal.  Abdominal: There is no tenderness.  Musculoskeletal: Normal range of motion.  Lymphadenopathy:    She has no cervical adenopathy.  Neurological: She is alert.       Mild confusion. Patient is generalized weakness in raising extremities. Some question of effort.  Skin: Skin is warm.    ED Course  Procedures (including critical care time)  Labs Reviewed  CBC WITH DIFFERENTIAL - Abnormal; Notable for the following:    RBC 5.42 (*)     Hemoglobin 11.8 (*)     MCV 73.8 (*)     MCH 21.8 (*)     MCHC 29.5 (*)     RDW 19.0 (*)     All other components within normal limits  URINALYSIS, ROUTINE W REFLEX MICROSCOPIC - Abnormal; Notable for the following:    Color, Urine AMBER (*)  BIOCHEMICALS MAY BE AFFECTED BY COLOR   APPearance TURBID (*)     Hgb urine dipstick LARGE (*)     Bilirubin Urine SMALL (*)     Ketones, ur 15 (*)     Protein, ur 100 (*)     Leukocytes, UA LARGE (*)     All other components within normal limits  COMPREHENSIVE METABOLIC PANEL - Abnormal; Notable for the following:    Sodium 131 (*)     Chloride 85 (*)     CO2 34 (*)     BUN 43 (*)     Creatinine, Ser 2.29 (*)     Albumin 3.3 (*)     AST 240 (*)     ALT 176 (*)     GFR calc non Af Amer 21 (*)     GFR calc Af Amer 24 (*)     All other components within normal limits  TROPONIN I - Abnormal; Notable for the following:    Troponin I 1.62 (*)     All other components within normal limits  URINE RAPID DRUG SCREEN (HOSP PERFORMED) - Abnormal; Notable for the following:    Opiates POSITIVE (*)  Benzodiazepines POSITIVE (*)     All other components within normal limits    URINE MICROSCOPIC-ADD ON - Abnormal; Notable for the following:    Squamous Epithelial / LPF FEW (*)     Bacteria, UA MANY (*)     Casts GRANULAR CAST (*)     All other components within normal limits  ETHANOL  URINE CULTURE   Dg Chest 2 View  02/01/2012  *RADIOLOGY REPORT*  Clinical Data: Lethargy.  CHEST - 2 VIEW  Comparison: 11/26/2011  Findings: The cardiopericardial silhouette is enlarged. Interstitial markings are diffusely coarsened with chronic features.  Bibasilar atelectasis noted.  Bones are diffusely demineralized. Telemetry leads overlie the chest.  IMPRESSION: Cardiomegaly with underlying chronic interstitial coarsening and some basilar atelectasis.   Original Report Authenticated By: ERIC A. MANSELL, M.D.    Ct Head Wo Contrast  02/01/2012  *RADIOLOGY REPORT*  Clinical Data: Weakness and lethargy  CT HEAD WITHOUT CONTRAST  Technique:  Contiguous axial images were obtained from the base of the skull through the vertex without contrast.  Comparison: None.  Findings: Ventricle size is normal.  Negative for intracranial hemorrhage.  Negative for acute infarct or mass.  Hypodensity left parietal white matter may represent a chronic infarct.  Calvarium is intact.  Paranasal sinuses are clear.  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  No acute abnormality.   Original Report Authenticated By: Camelia Phenes, M.D.      1. UTI (lower urinary tract infection)   2. Renal insufficiency   3. Cardiac enzymes elevated     Date: 02/01/2012  Rate: 86  Rhythm: normal sinus rhythm  QRS Axis: normal  Intervals: normal  ST/T Wave abnormalities: nonspecific ST/T changes  Conduction Disutrbances:right bundle branch block and left posterior fascicular block  Narrative Interpretation:   Old EKG Reviewed: changes noted     MDM  Patient presents with generalized weakness some confusion. Fatigue. She has apparent urinary tract infection and renal insufficiency. She also has elevated  troponin. Her EKG is relatively stable compared to previously. Blood pressures improved with some IV fluids. She'll be admitted to medicine and will have cardiology consult        Juliet Rude. Rubin Payor, MD 02/01/12 [phone]

## 2012-02-01 NOTE — Consult Note (Addendum)
CARDIOLOGY CONSULT NOTE   Patient ID: Jillian Bright MRN: [id number] DOB/AGE: 68-Jun-1947 68 y.o.  Admit date: 02/01/2012  Primary Physician   Jillian Salisbury, MD Primary Cardiologist   Jillian Bright Reason for Consultation   Elevated Troponin  HQI:ONGEXB Jillian Bright is a 66 y.o. female with a history of CAD, COPD and pulmonary HTN. She reports stumbling around the house for the last couple of days. She denies injuries but has fallen at least once. Today, a friend found her with decreased level of consciousness and  took her to [facility]. He had her transported by EMS to the ER. In the ER, her troponin was elevated and cardiology was asked to evaluate her.  Jillian Bright denies any chest pain. She feels her SOB is at baseline. Her home O2 is at 4 lpm, changed by the MD several weeks ago. She cannot walk far without getting SOB, but this is not new. She has chronic LE edema, no recent change. She is sleepy, but responds to loud verbal or physical stimulus. She knows she was "stumbling around the house", but doesn't know of any other new problems. She has leg pain, for which she was taking pain meds. She denies any other recent illnesses, fevers or chills. She has not had a productive cough and denies wheezing. She cannot say how long she has been "sleepy" and weak but her friend says she was much worse yesterday and today. Currently, she dislikes the CPAP and occasionally tries to take it off, but otherwise is not in acute distress.   Past Medical History  Diagnosis Date  . Diabetes mellitus   . COPD (chronic obstructive pulmonary disease)     sees Jillian. Coralyn Bright  . Sleep apnea, obstructive   . Pulmonary HTN   . Obesity (BMI 30.0-34.9)    Past Surgical History  Procedure Date  . Appendectomy   . Tonsillectomy and adenoidectomy   . Lumbar disc surgery   . Hemorrhoid surgery     Allergies  Allergen Reactions  . Codeine     I have reviewed the patient's current medications   . cefTRIAXone  (ROCEPHIN)  IV  1 g Intravenous Once   Medication Sig  albuterol (PROVENTIL HFA;VENTOLIN HFA) 108 (90 BASE) MCG/ACT inhaler Inhale 2 puffs into the lungs every 6 (six) hours as needed. For wheezing  ALPRAZolam (XANAX) 0.5 MG tablet Take 0.5 mg by mouth 3 (three) times daily as needed. For anxiety  aspirin EC 81 MG tablet Take 1 tablet (81 mg total) by mouth daily.  budesonide-formoterol (SYMBICORT) 160-4.5 MCG/ACT inhaler Inhale 2 puffs into the lungs 2 (two) times daily.  furosemide (LASIX) 40 MG tablet Take 80 mg by mouth 2 (two) times daily.  gabapentin (NEURONTIN) 300 MG capsule Take 300 mg by mouth 2 (two) times daily.  glipiZIDE (GLUCOTROL) 10 MG tablet Take 10 mg by mouth 2 (two) times daily before a meal.  guaiFENesin (MUCINEX) 600 MG 12 hr tablet Take 1,200 mg by mouth 2 (two) times daily as needed. For congestion  Hydrocodone-Acetaminophen 10-660 MG TABS Take 1 tablet by mouth every 6 (six) hours as needed. For pain  metFORMIN (GLUCOPHAGE) 1000 MG tablet Take 1,000 mg by mouth 2 (two) times daily with a meal.  NEOMYCIN-POLYMYXIN-HC, OPHTH, SUSP Apply 2 drops to eye 4  times daily as needed. For infection  omeprazole (PRILOSEC) 40 MG capsule Take 40 mg by mouth daily.  potassium chloride SA  20 MEQ tablet Take 20 mEq by mouth 2 (  two) times daily.  rosuvastatin (CRESTOR) 40 MG tablet Take 40 mg by mouth daily.  solifenacin (VESICARE) 5 MG tablet Take 10 mg by mouth daily.  spironolactone (ALDACTONE) 25 MG tablet Take 25 mg by mouth daily.  tiotropium (SPIRIVA) 18 MCG inhalation capsule Place 18 mcg into inhaler and inhale daily.   History   Social History  . Marital Status: Widowed    Spouse Name: N/A    Number of Children: N/A  . Years of Education: N/A   Occupational History  . used to work as a Jillian Bright    Social History Main Topics  . Smoking status: Former Smoker -- 2.0 packs/day for 36 years    Types: Cigarettes    Quit date: 05/28/2009  . Smokeless tobacco: Never  Used  . Alcohol Use: Yes     few times a year  . Drug Use: No  . Sexually Active: Not on file   Other Topics Concern  . Not on file   Social History Narrative   Lives alone     Family Status  Relation Status Death Age  . Mother Deceased     No CAD  . Father Deceased     No CAD  . Brother Alive     No CAD    ROS: No fevers/chills, minimal cough, non-productive. No bleeding problems. Full 14 point review of systems complete and found to be negative unless listed above.  Physical Exam: Blood pressure 103/85, pulse 90, temperature 98.5 F (36.9 C), temperature source Oral, resp. rate 10, SpO2 89.00%.  General: Well developed, well nourished, female sleepy, but in no acute distress Head: Eyes PERRLA, No xanthomas.   Normocephalic and atraumatic, oropharynx without edema or exudate. Dentition: good Lungs: bilateral basilar rales with some crackles Heart: HRRR S1 S2, no rub/gallop, 2-3/6 systolic murmur. pulses are 2+ all 4 extrem.   Neck: No carotid bruits. No lymphadenopathy.  JVD about 10 cm. Abdomen: Bowel sounds present, abdomen obese, soft and non-tender without masses or hernias noted. Msk:  No spine or cva tenderness. No weakness, no joint deformities or effusions. Extremities: No clubbing or cyanosis. 1+ bilateral LE edema.  Neuro: Alert and oriented X 3. No focal deficits noted. Psych:  Sleeps unless roused Skin: No rashes or lesions noted.   Labs:   Lab Results  Component Value Date   WBC 9.9 02/01/2012   HGB 11.8* 02/01/2012   HCT 40.0 02/01/2012   MCV 73.8* 02/01/2012   PLT 195 02/01/2012     Lab 02/01/12 1335  NA 131*  K 4.0  CL 85*  CO2 34*  BUN 43*  CREATININE 2.29*  CALCIUM 9.2  PROT 7.4  BILITOT 0.5  ALKPHOS 82  ALT 176*  AST 240*  GLUCOSE 81    Basename 02/01/12 1326  CKTOTAL --  CKMB --  TROPONINI 1.62*   Cardiac Cath: 11/13/2000 Left main has a distal 20% stenosis. Left anterior descending:  The left anterior descending artery has minor  luminal irregularities in the mid vessel.  The LAD gives rise to a normal sized first diagonal branch.  Left circumflex:  The left circumflex has a tubular 30% stenosis at its origin.  It gives rise to a single large obtuse marginal branch with three sub-branches.  Right coronary artery:  The right coronary artery is a dominant vessel.  In the proximal vessel are tandem 30% stenosis.  In the mid vessel is a 95% stenosis just proximal to the right ventricular branch.  There is  some haziness and irregularity associated with this lesion but TIMI-3 flow beyond it. Further down in the mid vessel at the acute angle is a 70% stenosis. The distal vessel has a diffuse 25% stenosis.  The right coronary artery gives rise to a normal sized posterior descending artery and normal sized first posterolateral branch and a small second posterolateral branch.  IMPRESSIONS: 1. Preserved left ventricular systolic function with only mild inferior wall hypokinesis. 2. One-vessel coronary artery disease as described.  The culprit lesion appears to be the 95% stenosis in the mid right coronary artery. PCI - 2.75 x 33 mm Bx Velocity stent extending across the 95% and the 70% stenosis in the mid RCA.   Echo: 11/12/2011 Study Conclusions - Left ventricle: The cavity size was normal. Wall thickness was increased in a pattern of mild LVH. There was mild focal basal hypertrophy of the septum. Systolic function was normal. The estimated ejection fraction was in the range of 60% to 65%. Wall motion was normal; there were no regional wall motion abnormalities. Doppler parameters are consistent with abnormal left ventricular relaxation (grade 1 diastolic dysfunction). - Aortic valve: Valve mobility was restricted. There was moderate stenosis. - Mitral valve: Calcified annulus. - Right ventricle: The cavity size was mildly dilated. Systolic function was mildly reduced. - Right atrium: The atrium was mildly dilated. - Pulmonary  arteries: Systolic pressure was moderately to severely increased. PA peak pressure: 61mm Hg (S). ** Mildly decreased RV function now. Moderate AS (increased from mild).  Normal EF. Moderate pulmonary HTN in the setting of OHS/OSA.   ECG: 01-Feb-2012 10:55:15  SINUS RHYTHM ~ normal P axis, V-rate 50- 99 RBBB AND LPFB ~ QRSd >135mS, axis(90,210) Vent. rate 86 BPM PR interval 164 Jillian QRS duration 132 Jillian QT/QTc 424/507 Jillian P-R-T axes 82 92 12   Radiology:  Dg Chest 2 View 02/01/2012  *RADIOLOGY REPORT*  Clinical Data: Lethargy.  CHEST - 2 VIEW  Comparison: 11/26/2011  Findings: The cardiopericardial silhouette is enlarged. Interstitial markings are diffusely coarsened with chronic features.  Bibasilar atelectasis noted.  Bones are diffusely demineralized. Telemetry leads overlie the chest.  IMPRESSION: Cardiomegaly with underlying chronic interstitial coarsening and some basilar atelectasis.   Original Report Authenticated By: ERIC A. MANSELL, M.D.    Ct Head Wo Contrast 02/01/2012  *RADIOLOGY REPORT*  Clinical Data: Weakness and lethargy  CT HEAD WITHOUT CONTRAST  Technique:  Contiguous axial images were obtained from the base of the skull through the vertex without contrast.  Comparison: None.  Findings: Ventricle size is normal.  Negative for intracranial hemorrhage.  Negative for acute infarct or mass.  Hypodensity left parietal white matter may represent a chronic infarct.  Calvarium is intact.  Paranasal sinuses are clear.  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  No acute abnormality.   Original Report Authenticated By: Camelia Phenes, M.D.     ASSESSMENT AND PLAN:   The patient was seen today by Jillian Tenny Craw, the patient evaluated and the data reviewed.   Acute respiratory failure/ possible Acute on chronic diastolic CHF (congestive heart failure): Pt sats are good on BIPAP. Will check a BNP. She likely needs diuresis but is difficult to assess because of her other acute/chronic resp  issues. For now, f/u on labs, ABG and BNP as you are.  Continue BIPAP per primary MD. Echo is from June, MD advise on repeat. Monitor closely.  Abnl Troponin - possibly secondary to resp strain. CKMB pending. Cont to cycle enzymes. ECG is  not acute, and with other issues, would not plan on invasive eval this admit unless has ST elevation. At some point, L/R heart cath may help with management. Add NTG for chest pain (if she develops it), she is already on ASA. No BB secondary to hx lung disease. Continue to follow.  Otherwise, per primary MD. Principal Problem:  *Acute encephalopathy Active Problems:  Elevated troponin  DIABETES MELLITUS, TYPE II  OBSTRUCTIVE SLEEP APNEA  HYPERTENSION  PULMONARY HYPERTENSION  DEPENDENT EDEMA  CAD (coronary artery disease)  Aortic stenosis  AKI (acute kidney injury)  Signed: Theodore Demark 02/01/2012, 4:25 PM Co-Sign MD   patient seen and examined.  Agree with findings of R Barrett.   Patient with history of pulmonary HTN. Presents with increased SOB  Found stumbling around home.On admit found to be hypoxic on admit.  Doing bettter with BIPAP  On exam:  JVP is increased.  Lungs with rales  Cardiac exam  RRR  No  S2. III/VI systolic murmur   ABdomen with RUQ tenderness.  Ext with edema  Etiol for above resp decompensation and R heart failure:  Question meds with sedation.  QUestion worsening oxygenation  Recomm: FOlow closely.  Diurese as BP tolerates.  Follow renal and hepatic function closely  2.  Elevated troponin.  Most likely demand ischemia in setting of pulmoN HRN.  Not convinced of acute ischemic event.  3.  AS  Mild to mod  Will need to be followed.  I am not convinced destabilizing.

## 2012-02-01 NOTE — ED Notes (Signed)
Pt would not arouse for me, PA entered room was able to get patient to awake with sternal rub, pt then became extremely drowsy, notified Dr. Rubin Payor, pt awakened and answered his question and then immediately falls back into a deep sleep

## 2012-02-01 NOTE — ED Notes (Signed)
Nurse ginnie called w/critical tropin 1.62

## 2012-02-01 NOTE — H&P (Addendum)
Name: Jillian Bright MRN: [id number] DOB: 1945-06-15    LOS: 0   PULMONARY / CRITICAL CARE MEDICINE H&P  HPI:   66 year old female with multiple medical problems including hypertension, coronary artery disease, diabetes mellitus type 2, and COPD/OHS presents weakness and altered mental status today.  Most of history obtain from patient's friend given her lethargy.  This AM she was found by her friend to be weak and confused.  She refused to come to the ED when she was initially this AM but agreed after seeing her PCP.  She was reported to have waxing and waning mental status for several days prior to today.  She did complain of pain in her left arm earlier this week and may have been taking more of her pain medications.  Detailed history history from the patient is limited but she does deny chest pain, increased SOB, fevers, or chills.  She complains of left arm pain and left leg pain.  In the ED she was given flumazenil and narcan with a slight response.  She was placed on Bipap.  Per friends she is slightly more alert since arriving in the ED.   Past Medical History  Diagnosis Date  . Diabetes mellitus   . COPD (chronic obstructive pulmonary disease)     sees Dr. Coralyn Helling  . Sleep apnea, obstructive   . Pulmonary HTN   . Obesity (BMI 30.0-34.9)    Past Surgical History  Procedure Date  . Appendectomy   . Tonsillectomy and adenoidectomy   . Lumbar disc surgery   . Hemorrhoid surgery    Prior to Admission medications   Medication Sig Start Date End Date Taking? Authorizing Provider  albuterol (PROVENTIL HFA;VENTOLIN HFA) 108 (90 BASE) MCG/ACT inhaler Inhale 2 puffs into the lungs every 6 (six) hours as needed. For wheezing   Yes Historical Provider, MD  ALPRAZolam Prudy Feeler) 0.5 MG tablet Take 0.5 mg by mouth 3 (three) times daily as needed. For anxiety   Yes Historical Provider, MD  aspirin EC 81 MG tablet Take 1 tablet (81 mg total) by mouth daily. 11/07/11  Yes [name],  MD  budesonide-formoterol [facility]) 160-4.5 MCG/ACT inhaler Inhale 2 puffs into the lungs 2 (two) times daily.   Yes Historical Provider, MD  furosemide (LASIX) 40 MG tablet Take 80 mg by mouth 2 (two) times daily.   Yes Historical Provider, MD  gabapentin (NEURONTIN) 300 MG capsule Take 300 mg by mouth 2 (two) times daily.   Yes Historical Provider, MD  glipiZIDE (GLUCOTROL) 10 MG tablet Take 10 mg by mouth 2 (two) times daily before a meal.   Yes Historical Provider, MD  guaiFENesin (MUCINEX) 600 MG 12 hr tablet Take 1,200 mg by mouth 2 (two) times daily as needed. For congestion   Yes Historical Provider, MD  Hydrocodone-Acetaminophen 10-660 MG TABS Take 1 tablet by mouth every 6 (six) hours as needed. For pain   Yes Historical Provider, MD  metFORMIN (GLUCOPHAGE) 1000 MG tablet Take 1,000 mg by mouth 2 (two) times daily with a meal.   Yes Historical Provider, MD  NEOMYCIN-POLYMYXIN-HC, OPHTH, SUSP Apply 2 drops to eye 4 (four) times daily as needed. For infection 07/20/11  Yes Nelwyn Salisbury, MD  omeprazole (PRILOSEC) 40 MG capsule Take 40 mg by mouth daily.   Yes Historical Provider, MD  potassium chloride SA (K-DUR,KLOR-CON) 20 MEQ tablet Take 20 mEq by mouth 2 (two) times daily.   Yes Historical Provider, MD  rosuvastatin (CRESTOR) 40 MG  tablet Take 40 mg by mouth daily.   Yes Historical Provider, MD  solifenacin (VESICARE) 5 MG tablet Take 10 mg by mouth daily.   Yes Historical Provider, MD  spironolactone (ALDACTONE) 25 MG tablet Take 25 mg by mouth daily.   Yes Historical Provider, MD  tiotropium (SPIRIVA) 18 MCG inhalation capsule Place 18 mcg into inhaler and inhale daily.   Yes Historical Provider, MD   Allergies Allergies  Allergen Reactions  . Codeine     Family History History reviewed. No pertinent family history. Social History  reports that she quit smoking about 2 years ago. Her smoking use included Cigarettes. She has a 72 pack-year smoking history. She has never used  smokeless tobacco. She reports that she drinks alcohol. She reports that she does not use illicit drugs.  Review Of Systems:  Review of systems limited due to patients AMS.  Brief patient description:  66 yo F with multiple medical problems presenting to the ED with weakness/confusion and found to have hypercarbic respiratory failure, elevated cardiac enzymes, UTI and AKI.  Events Since Admission:   Current Status:  Vital Signs: Temp:  [98.5 F (36.9 C)] 98.5 F (36.9 C) (09/06 1050) Pulse Rate:  [85-93] 91  (09/06 1833) Resp:  [8-17] 8  (09/06 1833) BP: (87-143)/(56-105) 116/60 mmHg (09/06 1730) SpO2:  [86 %-100 %] 96 % (09/06 1833)  Physical Examination: General:  Sleepy, on Bipap Neuro:  Awakens to loud voice and tactile stimulation.  Able to answer questions appropriately but fall asleep quickly.  CN II-XII intact. LE stregnth symmetric. UE with left sided strength limited due to pain.  HEENT:  Bipap in place, Dry MM. Neck:  Thick, supple, Cardiovascular:  RRR. Bilateral LE edema L>R. Lungs:  CTAB, no wheeze or murmur Abdomen:  Obese, soft, NT, ND Musculoskeletal:  Left calf tenderness, pain with movement of left wrist and elbow. Skin:  Chronic LE stasis changes.  Principal Problem:  *Acute encephalopathy Active Problems:  DIABETES MELLITUS, TYPE II  OBSTRUCTIVE SLEEP APNEA  HYPERTENSION  PULMONARY HYPERTENSION  DEPENDENT EDEMA  CAD (coronary artery disease)  Aortic stenosis  Acute respiratory failure  AKI (acute kidney injury)  Elevated troponin   ASSESSMENT AND PLAN  PULMONARY  Lab 02/01/12 1824  PHART 7.284*  PCO2ART 81.1*  PO2ART 95.0  HCO3 38.5*  O2SAT 96.0   Ventilator Settings: Bipap   CXR:   Cardiomegaly with underlying chronic interstitial coarsening and  some basilar atelectasis  A:  Hypercarbic respiratory failure, likely acute on chronic.  Suspect opiate/benzo use + OSA/AHS + COPD.  Consider contribution of COPD exacerbation and PE (given  left leg pain). P:   - Continue bipap. Serial ABGs. Monitor for mental status decline or worsening hypercarbia requiring intubation. - Hold narcotics. - A/A nebs + prn albuterol.  Start solumderol 60 mg daily now. - Empiric heparin for possible PE and obtain LE dopplers.  Hold CTA given AKI. - Cover COPD exacerbation with CTX + Azithro.  CARDIOVASCULAR  Lab 02/01/12 1723 02/01/12 1326  TROPONINI -- 1.62*  LATICACIDVEN -- --  PROBNP 10554.0* --   ECG:  Stable RBB Lines: PIVs  A: Elevated CEs P:  - Cycle CEs - Empiric heparin for ACS and possible PE - ASA 325 then 81 - Cont statin - Cards already consulted - Order TTE  RENAL  Lab 02/01/12 1335  NA 131*  K 4.0  CL 85*  CO2 34*  BUN 43*  CREATININE 2.29*  CALCIUM 9.2  MG --  PHOS --   Intake/Output    None    Foley:  96  A:  AKI possible 2/2 hypovolemia or ATN. Volume status challenging P:   - Cautious NS 100/hr x 10 hours and repeat electrolytes. - Renal US - Place foley to monitor UOP. - Further w/u if not improving with hydration.  GASTROINTESTINAL  Lab 02/01/12 1335  AST 240*  ALT 176*  ALKPHOS 82  BILITOT 0.5  PROT 7.4  ALBUMIN 3.3*    A:  Elevated LFTs P:   - Repeat LFTs in AM - Abd [place]  HEMATOLOGIC  Lab 02/01/12 1335  HGB 11.8*  HCT 40.0  PLT 195  INR --  APTT --   A:   P:  - Check coags prior to heparin gtt  INFECTIOUS  Lab 02/01/12 1335  WBC 9.9  PROCALCITON --   Cultures: Blood 9/6 Urine 9/6 Antibiotics: Ceftriaxone 9/6 Azithro 9/6  A:  UTI P:   - Cover UTI with Ceftriaxone - Add azithro for respiratory atypical coverage  ENDOCRINE No results found for this basename: GLUCAP:5 in the last 168 hours A:  DM2   P:   - Hold home orals - Aspart SSI  NEUROLOGIC  CT head 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 No acute abnormality.   A:  AMS likely multifactorial 2/2 opiates/benzos +/- hypercarbia +/- UTI +/- ? CVA P:   - Hold narcotics/benzos - Bipap as  above - Treat UTI - CT head without acute finding.  Consider further evaluation if not improving with holding sedating meds and with bipap. - Get XR of left wrist and elbow given pain   BEST PRACTICE / DISPOSITION Level of Care:  ICU Primary Service: PCCM Consultants:  Cardiologist Code Status:  Full Diet:  NPO until mental status improves DVT Px:  Therapeutic heparin GI Px:  N/A Skin Integrity:  N/A Social / Family:  Accompanied by friend, Ann Maki, [phone].  No children or spouse.  Has brother who Selena Batten will try to contact. Unsure if HCPOA assigned.   Critical Care time 65 minutes.  Deryl Ports, M.D. Pulmonary and Critical Care Medicine [facility] Pager: [phone]  02/01/2012, 8:09 PM

## 2012-02-01 NOTE — ED Notes (Signed)
Per pt's neighbors, Pt's brothers phone number is [phone] and neighbors Dawn & Karlton Lemon are closest to family living in this area, h: [phone] c: [phone]

## 2012-02-01 NOTE — Progress Notes (Signed)
I was called by nurse that patient remains very somnolent. Repeat ABG was performed which showed pH 7.28/81/95/38 while the patient remained on BiPAP. There was concern that the patient needed to be transferred to the ICU. Narcan 0.2 mg and Romazicon 0.4 mg were given to the patient. The patient's mentation improved minimally, but when not given stimulation, the patient continues to fall back asleep. I contacted critical care medicine to explain situation regarding this patient. They stated that they would be coming down to the emergency department to evaluate the patient for ICU admission. The patient's family was updated regarding the patient's condition at the bedside.

## 2012-02-01 NOTE — ED Notes (Signed)
Dr. Arbutus Leas notified of CK[facility]. Dr. Arbutus Leas to see pt.

## 2012-02-01 NOTE — ED Notes (Signed)
Dr Tat at bedside 

## 2012-02-01 NOTE — Telephone Encounter (Signed)
Caller: Kimberly/Other; Patient Name: Jillian Bright; PCP: Gershon Crane [facility]); Best Callback Phone Number: [phone].  Friend took patient to see Dr. Clent Ridges who transferred her by EMS to [facility] ER.  Friend was calling feeling like she was not getting the care that she needed--several that came in insisting that she fell and had a shoulder injury.  Friend was concerned that what was discussed with Dr. Clent Ridges related to TIA and possible stroke was being ignored.  RN stated that she would send not documenting her concern to Dr. Clent Ridges.   OFFICE: PLEASE PASS TO DR. Clent Ridges AS NEEDED. THANKS

## 2012-02-01 NOTE — Progress Notes (Signed)
ANTICOAGULATION CONSULT NOTE - Initial Consult  Pharmacy Consult for heparin Indication: R/o PE vs. R/o ACS  Allergies  Allergen Reactions  . Codeine     Patient Measurements: Height: 5' 6.14" (168 cm) Weight: 205 lb 14.6 oz (93.4 kg) (Per 01/14/12 documentation) IBW/kg (Calculated) : 59.63  Heparin Dosing Weight: 80 kg  Vital Signs: BP: 119/73 mmHg (09/06 2100) Pulse Rate: 96  (09/06 2227)  Labs:  Basename 02/01/12 2104 02/01/12 1723 02/01/12 1335 02/01/12 1326  HGB -- -- 11.8* --  HCT -- -- 40.0 --  PLT -- -- 195 --  APTT 33 -- -- --  LABPROT 15.9* -- -- --  INR 1.24 -- -- --  HEPARINUNFRC -- -- -- --  CREATININE -- -- 2.29* --  CKTOTAL -- 5137* -- --  CKMB -- 33.1* -- --  TROPONINI -- -- -- 1.62*    Estimated Creatinine Clearance: 27.5 ml/min (by C-G formula based on Cr of 2.29).   Medical History: Past Medical History  Diagnosis Date  . Diabetes mellitus   . COPD (chronic obstructive pulmonary disease)     sees Dr. Coralyn Helling  . Sleep apnea, obstructive   . Pulmonary HTN   . Obesity (BMI 30.0-34.9)     Medications:  Scheduled:    . albuterol  2.5 mg Nebulization Q6H  . ipratropium  0.5 mg Nebulization Q4H   And  . albuterol  2.5 mg Nebulization Q4H  . aspirin  324 mg Oral NOW   Or  . aspirin  300 mg Rectal NOW  . aspirin EC  325 mg Oral Daily  . aspirin EC  81 mg Oral Daily  . atorvastatin  80 mg Oral q1800  . azithromycin  500 mg Intravenous Q24H  . budesonide-formoterol  2 puff Inhalation BID  . budesonide-formoterol  2 puff Inhalation BID  . cefTRIAXone (ROCEPHIN)  IV  1 g Intravenous Once  . cefTRIAXone (ROCEPHIN)  IV  2 g Intravenous Q24H  . flumazenil  0.4 mg Intravenous Once  . insulin aspart  0-15 Units Subcutaneous TID WC  . insulin aspart  0-5 Units Subcutaneous QHS  . methylPREDNISolone (SOLU-MEDROL) injection  60 mg Intravenous Q24H  . naloxone (NARCAN) injection  0.2 mg Intravenous STAT  . pantoprazole (PROTONIX) IV  40 mg  Intravenous Daily    Assessment: 66 yo female admitted with weakness and AMS. Patient with positive CE, and possible PE. Due to AKI, no CTA at this time. Pharmacy consulted to manage heparin. Baseline INR 1.24.  Goal of Therapy:  Heparin level 0.3-0.7 units/ml Monitor platelets by anticoagulation protocol: Yes   Plan:  1. Heparin IV bolus of 4000 units x 1 2. Heparin IV infusion of 1400 units/hr. 3. Heparin level in 8 hours.  4. Daily CBC, heparin level.  Emeline Gins 02/01/2012,11:09 PM

## 2012-02-01 NOTE — ED Notes (Signed)
Pt in room and upon awaking, pt alert x 4, pt stated before friend enters room that her friend exaggerates stories and her friend keeps telling me information that she does not want the patient to hear.

## 2012-02-01 NOTE — ED Notes (Signed)
Pt undressed, in gown, on monitor, continuous pulse oximetry, blood pressure cuff and oxygen [place] (4L) 

## 2012-02-02 ENCOUNTER — Inpatient Hospital Stay (HOSPITAL_COMMUNITY): Payer: Medicare Other

## 2012-02-02 ENCOUNTER — Encounter (HOSPITAL_COMMUNITY): Payer: Self-pay | Admitting: *Deleted

## 2012-02-02 DIAGNOSIS — I2510 Atherosclerotic heart disease of native coronary artery without angina pectoris: Secondary | ICD-10-CM

## 2012-02-02 DIAGNOSIS — E662 Morbid (severe) obesity with alveolar hypoventilation: Secondary | ICD-10-CM

## 2012-02-02 DIAGNOSIS — I359 Nonrheumatic aortic valve disorder, unspecified: Secondary | ICD-10-CM

## 2012-02-02 DIAGNOSIS — J449 Chronic obstructive pulmonary disease, unspecified: Secondary | ICD-10-CM

## 2012-02-02 DIAGNOSIS — G9340 Encephalopathy, unspecified: Secondary | ICD-10-CM

## 2012-02-02 LAB — COMPREHENSIVE METABOLIC PANEL
AST: 147 U/L — ABNORMAL HIGH (ref 0–37)
CO2: 35 mEq/L — ABNORMAL HIGH (ref 19–32)
Calcium: 8.7 mg/dL (ref 8.4–10.5)
Creatinine, Ser: 1.9 mg/dL — ABNORMAL HIGH (ref 0.50–1.10)
GFR calc Af Amer: 30 mL/min — ABNORMAL LOW (ref >90)
GFR calc non Af Amer: 26 mL/min — ABNORMAL LOW (ref >90)
Total Protein: 7 g/dL (ref 6.0–8.3)

## 2012-02-02 LAB — TROPONIN I
Troponin I: 0.91 ng/mL (ref <0.30)
Troponin I: 0.36 ng/mL (ref <0.30)

## 2012-02-02 LAB — GLUCOSE, CAPILLARY: Glucose-Capillary: 216 mg/dL — ABNORMAL HIGH (ref 70–99)

## 2012-02-02 LAB — HEPARIN LEVEL (UNFRACTIONATED): Heparin Unfractionated: <0.1 IU/mL — ABNORMAL LOW (ref 0.30–0.70)

## 2012-02-02 LAB — HEMOGLOBIN A1C: Hgb A1c MFr Bld: 7.6 % — ABNORMAL HIGH (ref <5.7)

## 2012-02-02 MED ORDER — NALOXONE HCL 0.4 MG/ML IJ SOLN: 0.4000 mg | INTRAMUSCULAR | Status: DC | PRN

## 2012-02-02 MED ORDER — NALOXONE HCL 0.4 MG/ML IJ SOLN
0.2500 mg/h | INTRAVENOUS | Status: DC
  Administered 2012-02-02 (×4): 0.25 mg/h via INTRAVENOUS
  Filled 2012-02-02 (×5): qty 2.5

## 2012-02-02 MED ORDER — DEXTROSE 5 % IV SOLN
1.0000 g | Freq: Every day | INTRAVENOUS | Status: DC
  Administered 2012-02-02: 1 g via INTRAVENOUS
  Filled 2012-02-02 (×2): qty 10

## 2012-02-02 MED ORDER — CHLORHEXIDINE GLUCONATE 0.12 % MT SOLN
15.0000 mL | Freq: Two times a day (BID) | OROMUCOSAL | Status: DC
  Administered 2012-02-02: 15 mL via OROMUCOSAL
  Filled 2012-02-02: qty 15

## 2012-02-02 MED ORDER — NALOXONE HCL 0.4 MG/ML IJ SOLN
INTRAMUSCULAR | Status: AC
  Administered 2012-02-02: 0.4 mg
  Filled 2012-02-02: qty 1

## 2012-02-02 MED ORDER — INSULIN ASPART 100 UNIT/ML ~~LOC~~ SOLN
0.0000 [IU] | SUBCUTANEOUS | Status: DC
  Administered 2012-02-02 (×2): 5 [IU] via SUBCUTANEOUS

## 2012-02-02 MED ORDER — INSULIN ASPART 100 UNIT/ML ~~LOC~~ SOLN
0.0000 [IU] | Freq: Three times a day (TID) | SUBCUTANEOUS | Status: DC
  Administered 2012-02-02 – 2012-02-03 (×2): 3 [IU] via SUBCUTANEOUS
  Administered 2012-02-03: 8 [IU] via SUBCUTANEOUS
  Administered 2012-02-03: 5 [IU] via SUBCUTANEOUS
  Administered 2012-02-04 – 2012-02-05 (×3): 3 [IU] via SUBCUTANEOUS
  Administered 2012-02-05 (×2): 2 [IU] via SUBCUTANEOUS
  Administered 2012-02-06 – 2012-02-07 (×3): 3 [IU] via SUBCUTANEOUS

## 2012-02-02 MED ORDER — HEPARIN BOLUS VIA INFUSION
2000.0000 [IU] | Freq: Once | INTRAVENOUS | Status: AC
  Administered 2012-02-02: 2000 [IU] via INTRAVENOUS
  Filled 2012-02-02: qty 2000

## 2012-02-02 MED ORDER — BIOTENE DRY MOUTH MT LIQD
15.0000 mL | Freq: Two times a day (BID) | OROMUCOSAL | Status: DC
  Administered 2012-02-02 – 2012-02-06 (×4): 15 mL via OROMUCOSAL

## 2012-02-02 MED ORDER — HEPARIN (PORCINE) IN NACL 100-0.45 UNIT/ML-% IJ SOLN
1650.0000 [IU]/h | INTRAMUSCULAR | Status: DC
  Administered 2012-02-02: 1650 [IU]/h via INTRAVENOUS
  Filled 2012-02-02 (×2): qty 250

## 2012-02-02 MED ORDER — HEPARIN (PORCINE) IN NACL 100-0.45 UNIT/ML-% IJ SOLN
2000.0000 [IU]/h | INTRAMUSCULAR | Status: DC
  Administered 2012-02-03: 1750 [IU]/h via INTRAVENOUS
  Filled 2012-02-02 (×2): qty 250

## 2012-02-02 NOTE — Progress Notes (Signed)
ANTICOAGULATION CONSULT NOTE - Follow Up Consult  Pharmacy Consult for heparin Indication: R/o PE vs. R/o ACS   Allergies  Allergen Reactions  . Codeine     Patient Measurements: Height: 5' 6.14" (168 cm) Weight: 211 lb 6.7 oz (95.9 kg) IBW/kg (Calculated) : 59.63  Heparin Dosing Weight: 80kg  Vital Signs: Temp: 98.4 F (36.9 C) (09/07 0735) Temp src: Axillary (09/07 0735) BP: 104/66 mmHg (09/07 1100) Pulse Rate: 84  (09/07 1100)  Labs:  Alvira Philips 02/02/12 0953 02/02/12 0951 02/02/12 0527 02/02/12 0526 02/02/12 0027 02/01/12 2104 02/01/12 1723 02/01/12 1335  HGB -- -- -- -- -- -- -- 11.8*  HCT -- -- -- -- -- -- -- 40.0  PLT -- -- -- -- -- -- -- 195  APTT -- -- -- -- -- 33 -- --  LABPROT -- -- -- -- -- 15.9* -- --  INR -- -- -- -- -- 1.24 -- --  HEPARINUNFRC -- <0.10* -- -- -- -- -- --  CREATININE -- -- -- 1.90* -- -- -- 2.29*  CKTOTAL -- -- -- -- -- -- 5137* --  CKMB -- -- -- -- -- -- 33.1* --  TROPONINI 0.62* -- 0.88* -- 0.91* -- -- --    Estimated Creatinine Clearance: 33.6 ml/min (by C-G formula based on Cr of 1.9).   Medications:  Scheduled:    . ipratropium  0.5 mg Nebulization Q4H   And  . albuterol  2.5 mg Nebulization Q4H  . antiseptic oral rinse  15 mL Mouth Rinse q12n4p  . aspirin  324 mg Oral NOW   Or  . aspirin  300 mg Rectal NOW  . aspirin EC  81 mg Oral Daily  . atorvastatin  80 mg Oral q1800  . azithromycin  500 mg Intravenous QHS  . cefTRIAXone (ROCEPHIN)  IV  1 g Intravenous Once  . cefTRIAXone (ROCEPHIN)  IV  1 g Intravenous QHS  . chlorhexidine  15 mL Mouth Rinse BID  . flumazenil  0.4 mg Intravenous Once  . heparin  4,000 Units Intravenous Once  . insulin aspart  0-15 Units Subcutaneous Q4H  . naloxone      . naloxone (NARCAN) injection  0.2 mg Intravenous STAT  . pantoprazole (PROTONIX) IV  40 mg Intravenous Daily  . DISCONTD: albuterol  2.5 mg Nebulization Q6H  . DISCONTD: aspirin EC  325 mg Oral Daily  . DISCONTD:  budesonide-formoterol  2 puff Inhalation BID  . DISCONTD: budesonide-formoterol  2 puff Inhalation BID  . DISCONTD: cefTRIAXone (ROCEPHIN)  IV  2 g Intravenous QHS  . DISCONTD: insulin aspart  0-15 Units Subcutaneous TID WC  . DISCONTD: insulin aspart  0-5 Units Subcutaneous QHS  . DISCONTD: methylPREDNISolone (SOLU-MEDROL) injection  60 mg Intravenous QHS    Assessment: 66 yo female with NSTEMI and concern for PE on heparin. The initial heparin level is < 0.1 (no issues with the heparin infusion this am per RN)  Goal of Therapy:  Heparin level 0.3-0.7 units/ml Monitor platelets by anticoagulation protocol: Yes   Plan:  -Repeat heparin bolus at 2000 units and increase infusion to 1650 units/hr -Heparin level in 8 hrs  Harland German, Pharm D 02/02/2012 11:17 AM

## 2012-02-02 NOTE — Progress Notes (Signed)
ANTICOAGULATION CONSULT NOTE - Follow Up Consult  Pharmacy Consult for heparin Indication: R/o PE vs. R/o ACS   Allergies  Allergen Reactions  . Codeine     Patient Measurements: Height: 5' 6.14" (168 cm) Weight: 211 lb 6.7 oz (95.9 kg) IBW/kg (Calculated) : 59.63  Heparin Dosing Weight: 80kg  Vital Signs: Temp: 98.3 F (36.8 C) (09/07 2012) Temp src: Oral (09/07 2012) BP: 99/54 mmHg (09/07 2100) Pulse Rate: 92  (09/07 2100)  Labs:  Alvira Philips 02/02/12 2021 02/02/12 1631 02/02/12 1108 02/02/12 0953 02/02/12 0951 02/02/12 0526 02/01/12 2104 02/01/12 1723 02/01/12 1335  HGB -- -- -- -- -- -- -- -- 11.8*  HCT -- -- -- -- -- -- -- -- 40.0  PLT -- -- -- -- -- -- -- -- 195  APTT -- -- -- -- -- -- 33 -- --  LABPROT -- -- -- -- -- -- 15.9* -- --  INR -- -- -- -- -- -- 1.24 -- --  HEPARINUNFRC 0.29* -- -- -- <0.10* -- -- -- --  CREATININE -- -- -- -- -- 1.90* -- -- 2.29*  CKTOTAL -- -- -- -- -- -- -- 5137* --  CKMB -- -- -- -- -- -- -- 33.1* --  TROPONINI -- 0.36* 0.36* 0.62* -- -- -- -- --    Estimated Creatinine Clearance: 33.6 ml/min (by C-G formula based on Cr of 1.9).   Medications:  Scheduled:     . ipratropium  0.5 mg Nebulization Q4H   And  . albuterol  2.5 mg Nebulization Q4H  . antiseptic oral rinse  15 mL Mouth Rinse q12n4p  . aspirin  324 mg Oral NOW   Or  . aspirin  300 mg Rectal NOW  . aspirin EC  81 mg Oral Daily  . atorvastatin  80 mg Oral q1800  . azithromycin  500 mg Intravenous QHS  . cefTRIAXone (ROCEPHIN)  IV  1 g Intravenous QHS  . heparin  2,000 Units Intravenous Once  . heparin  4,000 Units Intravenous Once  . insulin aspart  0-15 Units Subcutaneous TID WC & HS  . naloxone      . pantoprazole (PROTONIX) IV  40 mg Intravenous Daily  . DISCONTD: albuterol  2.5 mg Nebulization Q6H  . DISCONTD: aspirin EC  325 mg Oral Daily  . DISCONTD: budesonide-formoterol  2 puff Inhalation BID  . DISCONTD: budesonide-formoterol  2 puff Inhalation BID  .  DISCONTD: cefTRIAXone (ROCEPHIN)  IV  2 g Intravenous QHS  . DISCONTD: chlorhexidine  15 mL Mouth Rinse BID  . DISCONTD: insulin aspart  0-15 Units Subcutaneous TID WC  . DISCONTD: insulin aspart  0-15 Units Subcutaneous Q4H  . DISCONTD: insulin aspart  0-5 Units Subcutaneous QHS  . DISCONTD: methylPREDNISolone (SOLU-MEDROL) injection  60 mg Intravenous QHS    Assessment: 66 yo female with NSTEMI and concern for PE on heparin. The 2nd heparin level is 0.29 and just slightly below goal on 1650 units/hr  Goal of Therapy:  Heparin level 0.3-0.7 units/ml Monitor platelets by anticoagulation protocol: Yes   Plan:  -Increase infusion to 1750 units/hr -Heparin level in am  Harland German, Pharm D 02/02/2012 10:08 PM

## 2012-02-02 NOTE — Progress Notes (Signed)
Blood cultures drawn on patient in 2100. Antibiotic already given in ED prior to [facility]. Doree Albee

## 2012-02-02 NOTE — Progress Notes (Signed)
  Echocardiogram 2D Echocardiogram has been performed.  [name] 02/02/2012, 2:52 PM

## 2012-02-02 NOTE — Progress Notes (Signed)
SUBJECTIVE: The patient is quite ill.  She is lethargic with hypercarbic respiratory failure requiring BiPap overnight.  At this time, she is unable to provide further history.    Marland Kitchen ipratropium  0.5 mg Nebulization Q4H   And  . albuterol  2.5 mg Nebulization Q4H  . antiseptic oral rinse  15 mL Mouth Rinse q12n4p  . aspirin  324 mg Oral NOW   Or  . aspirin  300 mg Rectal NOW  . aspirin EC  325 mg Oral Daily  . aspirin EC  81 mg Oral Daily  . atorvastatin  80 mg Oral q1800  . azithromycin  500 mg Intravenous QHS  . budesonide-formoterol  2 puff Inhalation BID  . cefTRIAXone (ROCEPHIN)  IV  1 g Intravenous Once  . cefTRIAXone (ROCEPHIN)  IV  2 g Intravenous QHS  . chlorhexidine  15 mL Mouth Rinse BID  . flumazenil  0.4 mg Intravenous Once  . heparin  4,000 Units Intravenous Once  . insulin aspart  0-15 Units Subcutaneous TID WC  . insulin aspart  0-5 Units Subcutaneous QHS  . methylPREDNISolone (SOLU-MEDROL) injection  60 mg Intravenous QHS  . naloxone      . naloxone (NARCAN) injection  0.2 mg Intravenous STAT  . pantoprazole (PROTONIX) IV  40 mg Intravenous Daily  . DISCONTD: albuterol  2.5 mg Nebulization Q6H  . DISCONTD: budesonide-formoterol  2 puff Inhalation BID      . sodium chloride 100 mL/hr at 02/02/12 0738  . heparin 1,400 Units/hr (02/02/12 0006)  . naloxone 0.25 mg/hr (02/02/12 0926)  . DISCONTD: sodium chloride 75 mL/hr at 02/01/12 1726    OBJECTIVE: Physical Exam: Filed Vitals:   02/02/12 0800 02/02/12 0806 02/02/12 0900 02/02/12 1000  BP: 118/62  105/64 103/68  Pulse: 88  89 88  Temp:      TempSrc:      Resp: 20  17 13   Height:      Weight:      SpO2: 98% 95% 85% 92%    Intake/Output Summary (Last 24 hours) at 02/02/12 1026 Last data filed at 02/02/12 1000  Gross per 24 hour  Intake   1899 ml  Output   1975 ml  Net    -76 ml    Telemetry reveals sinus rhythm  GEN- The patient is overweight and ill appearing, wakes to voice but quickly  returns to sleep Head- normocephalic, atraumatic Eyes-  Sclera clear, conjunctiva pink Ears- hearing intact Oropharynx- clear Neck- supple, JVP elevated Lungs- decreased BS with prolonged expiratory phase Heart- Regular rate and rhythm, with decreased HS GI- soft, NT, ND, + BS Extremities- no clubbing, cyanosis, or edema Skin- no rash or lesion Psych- lethargic Neuro- lethargic  LABS: Basic Metabolic Panel:  Basename 02/02/12 0526 02/01/12 1335  NA 133* 131*  K 4.2 4.0  CL 89* 85*  CO2 35* 34*  GLUCOSE 200* 81  BUN 40* 43*  CREATININE 1.90* 2.29*  CALCIUM 8.7 9.2  MG -- --  PHOS -- --   Liver Function Tests:  [facility] 02/02/12 0526 02/01/12 1335  AST 147* 240*  ALT 146* 176*  ALKPHOS 75 82  BILITOT 0.3 0.5  PROT 7.0 7.4  ALBUMIN 2.9* 3.3*   No results found for this basename: LIPASE:2,AMYLASE:2 in the last 72 hours CBC:  Basename 02/01/12 1335  WBC 9.9  NEUTROABS 7.3  HGB 11.8*  HCT 40.0  MCV 73.8*  PLT 195   Cardiac Enzymes:  Basename 02/02/12 0527 02/02/12 0027 02/01/12  1723 02/01/12 1326  CKTOTAL -- -- 5137* --  CKMB -- -- 33.1* --  CKMBINDEX -- -- -- --  TROPONINI 0.88* 0.91* -- 1.62*    ASSESSMENT AND PLAN:  Principal Problem:  *Acute encephalopathy Active Problems:  DIABETES MELLITUS, TYPE II  OBSTRUCTIVE SLEEP APNEA  HYPERTENSION  PULMONARY HYPERTENSION  DEPENDENT EDEMA  CAD (coronary artery disease)  Aortic stenosis  Acute respiratory failure  AKI (acute kidney injury)  Elevated troponin   66 yo with history of CAD s/p RCA PCI in 2002 with RV failure and pulmonary hypertension.   She has moderate to severe COPD and was diagnosed with OHS/OSA by the pulmonary service previously. She has quit smoking and is using home oxygen but is unable to tolerate CPAP.  She now presents with hypercarbic respiratory failure/ AMS.  1. NSTEMI- demand event secondary to acute respiratory failure Continue current treatment,  Would not advise cath at  this point  2. Acute hypercarbic respiratory failure On BIV overnight,  ABG this am to further assess CO2 would be helpful.  Will defer to PCCM Does not appear to be significantly volume overloaded.  Elevated JVP due to pulmonary hypertension Echo ordered to assess RV function/ pulmonary hypertension Echo from 6/13 is reviewed Keep Is and Os even to slightly negative  3. Pulmonary hypertension Stable  No new recs at this time Her short term prognosis is guarded.  Her long term prognosis is poor.   Hillis Range, MD 02/02/2012 10:26 AM d

## 2012-02-02 NOTE — Progress Notes (Signed)
Name: Jillian Bright MRN: [id number] DOB: [birth year]    LOS: 1   PULMONARY / CRITICAL CARE MEDICINE H&P   Brief patient description:  66 yo F with multiple medical problems presenting to the ED with weakness/confusion and found to have hypercarbic respiratory failure, elevated cardiac enzymes, UTI and AKI.  Events Since Admission: 9/7 - remains lethargic, required BiPap overnight   Vital Signs: Temp:  [98.3 F (36.8 C)-98.6 F (37 C)] 98.3 F (36.8 C) (09/07 1130) Pulse Rate:  [84-98] 90  (09/07 1300) Resp:  [8-20] 13  (09/07 1300) BP: (91-119)/(50-85) 101/62 mmHg (09/07 1300) SpO2:  [85 %-100 %] 95 % (09/07 1300) FiO2 (%):  [40 %-50 %] 50 % (09/07 1147) Weight:  [205 lb 14.6 oz (93.4 kg)-211 lb 6.7 oz (95.9 kg)] 211 lb 6.7 oz (95.9 kg) (09/07 0500)  Physical Examination: General:  Sleepy, on Bipap Neuro:  Awakens to loud voice and tactile stimulation.  Able to answer questions appropriately but fall asleep quickly.  CN II-XII intact. LE stregnth symmetric. UE with left sided strength limited due to pain.  HEENT:  Bipap in place, Dry MM. Neck:  Thick, supple, Cardiovascular:  RRR. Bilateral LE edema L>R. Lungs:  CTAB, no wheeze or murmur Abdomen:  Obese, soft, NT, ND Musculoskeletal:  Left calf tenderness, pain with movement of left wrist and elbow. Skin:  Chronic LE stasis changes.  Principal Problem:  *Acute encephalopathy Active Problems:  DIABETES MELLITUS, TYPE II  OBSTRUCTIVE SLEEP APNEA  HYPERTENSION  PULMONARY HYPERTENSION  DEPENDENT EDEMA  CAD (coronary artery disease)  Aortic stenosis  Acute respiratory failure  AKI (acute kidney injury)  Elevated troponin   ASSESSMENT AND PLAN  PULMONARY  Lab 02/01/12 2339 02/01/12 1824  PHART 7.360 7.284*  PCO2ART 66.2* 81.1*  PO2ART 69.0* 95.0  HCO3 37.5* 38.5*  O2SAT 92.0 96.0   Ventilator Settings: Bipap Vent Mode:  [-]  FiO2 (%):  [40 %-50 %] 50 % CXR:  9/6 basilar atx  A:   Hypercarbic respiratory  failure, likely acute on chronic-Suspect opiate/benzo use + OSA/AHS + COPD.  Consider contribution of COPD exacerbation and PE (given left leg pain). OHS/OSA- on O2 but does not tolerate CPAP at home Pulmonary HTN Former Smoker  P:   - Continue bipap. Serial ABGs. Monitor for mental status decline or worsening hypercarbia requiring intubation. - Hold narcotics. - A/A nebs + prn albuterol.  Start solumderol 60 mg daily now. - Empiric heparin for possible PE and obtain LE dopplers.  Hold CTA given AKI. - Cover COPD exacerbation with CTX + Azithro. - pulmonary hygiene - lower extremity doppler   CARDIOVASCULAR  Lab 02/02/12 1108 02/02/12 0953 02/02/12 0527 02/02/12 0027 02/01/12 1723 02/01/12 1326  TROPONINI 0.36* 0.62* 0.88* 0.91* -- 1.62*  LATICACIDVEN -- -- -- -- -- --  PROBNP -- -- -- -- 10554.0* --   9/6 2D ECHO>>>  ECG:  Stable RBB Lines: PIVs  A:  Elevated CEs  P:  - Cycle CEs - Empiric heparin for ACS and possible PE - ASA 81 - Cont statin - Cardiology Following, appreciate input - ECHO pending  RENAL  Lab 02/02/12 0526 02/01/12 1335  NA 133* 131*  K 4.2 4.0  CL 89* 85*  CO2 35* 34*  BUN 40* 43*  CREATININE 1.90* 2.29*  CALCIUM 8.7 9.2  MG -- --  PHOS -- --   Intake/Output      09/06 0701 - 09/07 0700 09/07 0701 - 09/08 0700   I.V. (mL/kg)  1149.5 (12) 744 (7.8)   IV Piggyback 300    Total Intake(mL/kg) 1449.5 (15.1) 744 (7.8)   Urine (mL/kg/hr) 1350 (0.6) 625 (1.1)   Total Output 1350 625   Net +99.5 +119         Foley:  96  A:   AKI possible 2/2 hypovolemia or ATN. Volume status challenging.  Sr Cr improved with volume.    P:   - Reduce NS to KVO - Renal US - Strict I/O's - Further w/u if not improving with hydration.  GASTROINTESTINAL  Lab 02/02/12 0526 02/01/12 1335  AST 147* 240*  ALT 146* 176*  ALKPHOS 75 82  BILITOT 0.3 0.5  PROT 7.0 7.4  ALBUMIN 2.9* 3.3*   9/7 ABD US>>>  A:   Elevated LFTs  P:   - repeat LFT's  improving - pending abd [place]  HEMATOLOGIC  Lab 02/01/12 2104 02/01/12 1335  HGB -- 11.8*  HCT -- 40.0  PLT -- 195  INR 1.24 --  APTT 33 --   A:   Rule out DVT / PE  P:  - Heparin gtt in setting of ACS / rule out PE & DVT  INFECTIOUS  Lab 02/01/12 1335  WBC 9.9  PROCALCITON --   Cultures: Blood 9/6 Urine 9/6 Antibiotics: Ceftriaxone 9/6 Azithro 9/6  A:   UTI  P:   - Cover UTI with Ceftriaxone - Add azithro for respiratory atypical coverage  ENDOCRINE  Lab 02/02/12 1126 02/02/12 0732 02/02/12 0021 02/01/12 2232  GLUCAP 206* 216* 142* 133*   A:   DM2    P:   - Hold home orals - SSI  NEUROLOGIC  CT head 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 No acute abnormality.   A:   AMS likely multifactorial 2/2 opiates/benzos +/- hypercarbia +/- UTI +/- ? CVA  P:   - Hold narcotics/benzos - Bipap as above - Treat UTI - CT head without acute finding.  Consider further evaluation if not improving with holding sedating meds and with bipap. - Get XR of left wrist and elbow given pain   BEST PRACTICE / DISPOSITION Level of Care:  ICU Primary Service: PCCM Consultants:  Cardiologist Code Status:  Full Diet:  NPO until mental status improves DVT Px:  Therapeutic heparin GI Px:  N/A Skin Integrity:  N/A Social / Family:  Accompanied by friend, Ann Maki, [phone].  No children or spouse.  Has brother who Selena Batten will try to contact. Unsure if HCPOA assigned.   Canary Brim, NP-C [facility] Pgr: [phone] or [phone]     02/02/2012, 1:11 PM   Seen on CCM rounds this morning with resident MD or ACNP above.  Pt examined and database reviewed. I agree with above findings, assessment and plan as reflected in the note above. 30 mins CCM time  Billy Fischer, MD;  PCCM service; Mobile [phone]

## 2012-02-03 ENCOUNTER — Inpatient Hospital Stay (HOSPITAL_COMMUNITY): Payer: Medicare Other

## 2012-02-03 DIAGNOSIS — R609 Edema, unspecified: Secondary | ICD-10-CM

## 2012-02-03 DIAGNOSIS — N390 Urinary tract infection, site not specified: Secondary | ICD-10-CM

## 2012-02-03 DIAGNOSIS — M79609 Pain in unspecified limb: Secondary | ICD-10-CM

## 2012-02-03 DIAGNOSIS — R0689 Other abnormalities of breathing: Secondary | ICD-10-CM | POA: Diagnosis present

## 2012-02-03 DIAGNOSIS — R0609 Other forms of dyspnea: Secondary | ICD-10-CM

## 2012-02-03 DIAGNOSIS — I509 Heart failure, unspecified: Secondary | ICD-10-CM

## 2012-02-03 DIAGNOSIS — G9340 Encephalopathy, unspecified: Secondary | ICD-10-CM | POA: Diagnosis present

## 2012-02-03 LAB — COMPREHENSIVE METABOLIC PANEL
AST: 83 U/L — ABNORMAL HIGH (ref 0–37)
BUN: 33 mg/dL — ABNORMAL HIGH (ref 6–23)
CO2: 34 mEq/L — ABNORMAL HIGH (ref 19–32)
Chloride: 92 mEq/L — ABNORMAL LOW (ref 96–112)
Creatinine, Ser: 1.2 mg/dL — ABNORMAL HIGH (ref 0.50–1.10)
GFR calc Af Amer: 53 mL/min — ABNORMAL LOW (ref >90)
GFR calc non Af Amer: 46 mL/min — ABNORMAL LOW (ref >90)
Glucose, Bld: 166 mg/dL — ABNORMAL HIGH (ref 70–99)
Total Bilirubin: 0.2 mg/dL — ABNORMAL LOW (ref 0.3–1.2)

## 2012-02-03 LAB — CBC
HCT: 38 % (ref 36.0–46.0)
MCV: 74.4 fL — ABNORMAL LOW (ref 78.0–100.0)
RDW: 19.1 % — ABNORMAL HIGH (ref 11.5–15.5)
WBC: 9.7 10*3/uL (ref 4.0–10.5)

## 2012-02-03 LAB — GLUCOSE, CAPILLARY
Glucose-Capillary: 218 mg/dL — ABNORMAL HIGH (ref 70–99)
Glucose-Capillary: 64 mg/dL — ABNORMAL LOW (ref 70–99)

## 2012-02-03 LAB — PRO B NATRIURETIC PEPTIDE: Pro B Natriuretic peptide (BNP): 3575 pg/mL — ABNORMAL HIGH (ref 0–125)

## 2012-02-03 LAB — URINE CULTURE

## 2012-02-03 LAB — PROCALCITONIN: Procalcitonin: 0.17 ng/mL

## 2012-02-03 MED ORDER — METFORMIN HCL 500 MG PO TABS
1000.0000 mg | ORAL_TABLET | Freq: Two times a day (BID) | ORAL | Status: DC
  Administered 2012-02-03 – 2012-02-07 (×8): 1000 mg via ORAL
  Filled 2012-02-03 (×10): qty 2

## 2012-02-03 MED ORDER — IPRATROPIUM BROMIDE 0.02 % IN SOLN
0.5000 mg | Freq: Four times a day (QID) | RESPIRATORY_TRACT | Status: DC
  Administered 2012-02-03 – 2012-02-04 (×5): 0.5 mg via RESPIRATORY_TRACT
  Filled 2012-02-03 (×4): qty 2.5

## 2012-02-03 MED ORDER — FUROSEMIDE 40 MG PO TABS
40.0000 mg | ORAL_TABLET | Freq: Every day | ORAL | Status: DC
  Administered 2012-02-03 – 2012-02-06 (×4): 40 mg via ORAL
  Filled 2012-02-03 (×5): qty 1

## 2012-02-03 MED ORDER — PANTOPRAZOLE SODIUM 40 MG PO TBEC
40.0000 mg | DELAYED_RELEASE_TABLET | Freq: Every day | ORAL | Status: DC
  Administered 2012-02-04 – 2012-02-06 (×3): 40 mg via ORAL
  Filled 2012-02-03 (×3): qty 1

## 2012-02-03 MED ORDER — GLUCOSE-VITAMIN C 4-6 GM-MG PO CHEW
4.0000 | CHEWABLE_TABLET | ORAL | Status: DC | PRN
  Administered 2012-02-03: 4 via ORAL

## 2012-02-03 MED ORDER — GLUCOSE-VITAMIN C 4-6 GM-MG PO CHEW
CHEWABLE_TABLET | ORAL | Status: AC
  Filled 2012-02-03: qty 1

## 2012-02-03 MED ORDER — GLUCOSE 40 % PO GEL: 1.0000 | ORAL | Status: DC | PRN

## 2012-02-03 MED ORDER — ENOXAPARIN SODIUM 40 MG/0.4ML ~~LOC~~ SOLN
40.0000 mg | SUBCUTANEOUS | Status: DC
  Administered 2012-02-03 – 2012-02-06 (×4): 40 mg via SUBCUTANEOUS
  Filled 2012-02-03 (×5): qty 0.4

## 2012-02-03 MED ORDER — ALBUTEROL SULFATE (5 MG/ML) 0.5% IN NEBU: 2.5000 mg | INHALATION_SOLUTION | RESPIRATORY_TRACT | Status: DC | PRN

## 2012-02-03 MED ORDER — SPIRONOLACTONE 50 MG PO TABS
50.0000 mg | ORAL_TABLET | Freq: Two times a day (BID) | ORAL | Status: DC
  Administered 2012-02-04 – 2012-02-07 (×7): 50 mg via ORAL
  Filled 2012-02-03 (×11): qty 1

## 2012-02-03 MED ORDER — ALBUTEROL SULFATE (5 MG/ML) 0.5% IN NEBU
2.5000 mg | INHALATION_SOLUTION | Freq: Four times a day (QID) | RESPIRATORY_TRACT | Status: DC
  Administered 2012-02-03 – 2012-02-04 (×5): 2.5 mg via RESPIRATORY_TRACT
  Filled 2012-02-03 (×4): qty 0.5

## 2012-02-03 MED ORDER — GLIPIZIDE 10 MG PO TABS
10.0000 mg | ORAL_TABLET | Freq: Two times a day (BID) | ORAL | Status: DC
  Administered 2012-02-03 – 2012-02-07 (×8): 10 mg via ORAL
  Filled 2012-02-03 (×10): qty 1

## 2012-02-03 NOTE — Progress Notes (Signed)
ANTICOAGULATION CONSULT NOTE - Follow Up Consult  Pharmacy Consult for heparin Indication: R/o PE vs. R/o ACS   Allergies  Allergen Reactions  . Codeine     Patient Measurements: Height: 5' 6.14" (168 cm) Weight: 211 lb 6.7 oz (95.9 kg) IBW/kg (Calculated) : 59.63  Heparin Dosing Weight: 80kg  Vital Signs: Temp: 98.3 F (36.8 C) (09/08 0430) Temp src: Axillary (09/08 0430) BP: 106/59 mmHg (09/08 0400) Pulse Rate: 83  (09/08 0400)  Labs:  Alvira Philips 02/03/12 0455 02/02/12 2256 02/02/12 2021 02/02/12 1631 02/02/12 1108 02/02/12 0951 02/02/12 0526 02/01/12 2104 02/01/12 1723 02/01/12 1335  HGB 10.6* -- -- -- -- -- -- -- -- 11.8*  HCT 38.0 -- -- -- -- -- -- -- -- 40.0  PLT 161 -- -- -- -- -- -- -- -- 195  APTT -- -- -- -- -- -- -- 33 -- --  LABPROT -- -- -- -- -- -- -- 15.9* -- --  INR -- -- -- -- -- -- -- 1.24 -- --  HEPARINUNFRC 0.22* -- 0.29* -- -- <0.10* -- -- -- --  CREATININE -- -- -- -- -- -- 1.90* -- -- 2.29*  CKTOTAL -- -- -- -- -- -- -- -- 5137* --  CKMB -- -- -- -- -- -- -- -- 33.1* --  TROPONINI -- 0.42* -- 0.36* 0.36* -- -- -- -- --    Estimated Creatinine Clearance: 33.6 ml/min (by C-G formula based on Cr of 1.9).  Assessment: 66 yo female with NSTEMI and concern for PE for Heparin  Goal of Therapy:  Heparin level 0.3-0.7 units/ml Monitor platelets by anticoagulation protocol: Yes   Plan:  Increase Heparin 2000 units/hr Check heparin level in 8 hours.  Geannie Risen, PharmD, BCPS   02/03/2012 5:39 AM

## 2012-02-03 NOTE — Progress Notes (Addendum)
Patient ID: Jillian Bright, female   DOB: 1945/08/20, 66 y.o.   MRN: [id number] Subjective:  No chest pain. Dyspnea improved.  Objective:  Vital Signs in the last 24 hours: Temp:  [98.3 F (36.8 C)-98.5 F (36.9 C)] 98.4 F (36.9 C) (09/08 0744) Pulse Rate:  [80-97] 80  (09/08 0800) Resp:  [10-21] 16  (09/08 0800) BP: (85-116)/(31-76) 111/59 mmHg (09/08 0800) SpO2:  [91 %-98 %] 98 % (09/08 0802) FiO2 (%):  [50 %] 50 % (09/07 1147) Weight:  [210 lb 5.1 oz (95.4 kg)] 210 lb 5.1 oz (95.4 kg) (09/08 0500)  Intake/Output from previous day: 09/07 0701 - 09/08 0700 In: 2443.5 [P.O.:720; I.V.:1423.5; IV Piggyback:300] Out: 3275 [Urine:3275] Intake/Output from this shift: Total I/O In: 20 [I.V.:20] Out: 350 [Urine:350]  Physical Exam: obese appearing middle aged woman, NAD HEENT: Unremarkable Neck:  No JVD, no thyromegally Lungs:  Clear with scatterd basilar rales. No wheezes. HEART:  IRegular rate rhythm, no murmurs, no rubs, no clicks Abd:  soft, obese, positive bowel sounds, no organomegally, no rebound, no guarding Ext:  2 plus pulses, no edema, no cyanosis, no clubbing Skin:  No rashes no nodules Neuro:  CN II through XII intact, motor grossly intact  Lab Results:  Basename 02/03/12 0455 02/01/12 1335  WBC 9.7 9.9  HGB 10.6* 11.8*  PLT 161 195    Basename 02/03/12 0455 02/02/12 0526  NA 136 133*  K 4.0 4.2  CL 92* 89*  CO2 34* 35*  GLUCOSE 166* 200*  BUN 33* 40*  CREATININE 1.20* 1.90*    Basename 02/02/12 2256 02/02/12 1631  TROPONINI 0.42* 0.36*   Hepatic Function Panel  Basename 02/03/12 0455  PROT 6.6  ALBUMIN 2.6*  AST 83*  ALT 133*  ALKPHOS 73  BILITOT 0.2*  BILIDIR --  IBILI --   No results found for this basename: CHOL in the last 72 hours No results found for this basename: PROTIME in the last 72 hours  Imaging: Dg Chest 2 View  02/01/2012  *RADIOLOGY REPORT*  Clinical Data: Lethargy.  CHEST - 2 VIEW  Comparison: 11/26/2011  Findings: The  cardiopericardial silhouette is enlarged. Interstitial markings are diffusely coarsened with chronic features.  Bibasilar atelectasis noted.  Bones are diffusely demineralized. Telemetry leads overlie the chest.  IMPRESSION: Cardiomegaly with underlying chronic interstitial coarsening and some basilar atelectasis.   Original Report Authenticated By: ERIC A. MANSELL, M.D.    Dg Elbow 2 Views Left  02/02/2012  *RADIOLOGY REPORT*  Clinical Data: Left elbow pain after fall.  LEFT ELBOW - 2 VIEW  Comparison: None.  Findings: Left elbow appears intact on two views.  No evidence of acute fracture or subluxation.  No focal bone lesion or bone destruction.  Tiny osseous fragment over the olecranon could represent  IMPRESSION: No displaced fractures identified.  Bone fragment over the olecranon could represent an or avulsion fragment.   Original Report Authenticated By: Marlon Pel, M.D.    Dg Wrist 2 Views Left  02/02/2012  *RADIOLOGY REPORT*  Clinical Data: Left wrist pain after fall.  LEFT WRIST - 2 VIEW  Comparison: None.  Findings: Degenerative changes in the STT and first carpometacarpal joints.  No evidence of acute fracture or subluxation.  No focal bone lesion or bone destruction.  Bone cortex and trabecular architecture appear intact. Consider additional oblique views for better evaluation of the scaphoid bone if pain is localized to this area.  IMPRESSION: Degenerative changes in the left wrist.  No acute  fractures identified.   Original Report Authenticated By: Marlon Pel, M.D.    Ct Head Wo Contrast  02/01/2012  *RADIOLOGY REPORT*  Clinical Data: Weakness and lethargy  CT HEAD WITHOUT CONTRAST  Technique:  Contiguous axial images were obtained from the base of the skull through the vertex without contrast.  Comparison: None.  Findings: Ventricle size is normal.  Negative for intracranial hemorrhage.  Negative for acute infarct or mass.  Hypodensity left parietal white matter may represent a  chronic infarct.  Calvarium is intact.  Paranasal sinuses are clear.  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  No acute abnormality.   Original Report Authenticated By: Camelia Phenes, M.D.    US Abdomen Complete  02/02/2012  *RADIOLOGY REPORT*  Clinical Data:  Acute kidney injury, elevated LFTs  ABDOMINAL ULTRASOUND COMPLETE  Comparison:  None.  Findings:  Gallbladder:  Echogenic material without associated some shadowing layers within the gallbladder lumen consistent with sludge.  Per the sonographer, the sonographic Murphy's sign was negative.  Wall thickness is at the upper limits of normal at 3.8 mm.  No pericholecystic fluid.  Common Bile Duct:  Within normal limits in caliber at 3.6 mm.  Liver: No focal mass lesion identified.  Within normal limits in parenchymal echogenicity.  IVC:  Appears normal.  Pancreas:  No abnormality identified.  Spleen:  Within normal limits in size and echotexture.  Right kidney:  Normal in size and parenchymal echogenicity.  No evidence of mass or hydronephrosis. Length equals 11.2 cm  Left kidney:  Normal in size and parenchymal echogenicity.  No evidence of mass or hydronephrosis. Right thecal.  0.2 cm  Abdominal Aorta:  No aneurysm identified, although the distal abdominal aorta is not visualized.  Maximal diameter 1.9 cm.  IMPRESSION:  1.  No evidence of hydronephrosis, or other renal abnormality. 2.  Unremarkable sonographic appearance of the liver. 3.  Small amount of sludge noted layering within the gallbladder without sonographic signs to suggest acute cholecystitis.   Original Report Authenticated By: Alvino Blood Chest Port 1 View  02/03/2012  *RADIOLOGY REPORT*  Clinical Data: Respiratory failure.  PORTABLE CHEST - 1 VIEW  Comparison: Chest x-ray 02/01/2012.  Findings: Lung volumes are normal.  Linear bibasilar opacities are favored to represent areas of subsegmental atelectasis.  No definite consolidative airspace disease.  No definite pleural  effusions.  Pulmonary venous congestion with very mild indistinctness of the interstitial markings, suggesting some mild interstitial pulmonary edema.  Mild cardiomegaly is unchanged. Mediastinal contours are otherwise unremarkable.  Atherosclerosis in the thoracic aorta.  IMPRESSION: 1.  Appearance of the chest is suggestive of mild congestive heart failure, as above. 2.  Atherosclerosis. 3.  Minimal bibasilar subsegmental atelectasis.   Original Report Authenticated By: Florencia Reasons, M.D.     Cardiac Studies: Tele - NSR, Echo - preserved LV function, moderate AS Assessment/Plan:  1. VDRF 2. Acute/chronic heart failure 3. HTN 4. COPD Rec: she appears improved overall. Unclear what caused her improvement. Volume status seems better. Weight down a pound. She might be able to be moved to step down later today. Progress activity.  LOS: 2 days    Lewayne Bunting, M.D. 02/03/2012, 9:46 AM

## 2012-02-03 NOTE — Progress Notes (Signed)
Lower extremity venous duplex has been completed. Preliminary results: bilaterally no evidence of DVT. Bilateral baker's cyst.   Farrel Demark, RDMS, RVT 02/03/2012 11:21 AM

## 2012-02-03 NOTE — Progress Notes (Signed)
Pt profile:  COPD, secondary pulmonary hypertension, ongoing smoker, DMII, hypertension, OHS, chronic pain, anxiety. Admitted 9/6 with hypercarbic respiratory failure. Ttreated with NPPV and responded to naloxone   Subj:  Fully awake, alert and appropriate. No complaints of chest pain or dyspnea   Obj: Filed Vitals:   02/03/12 0800  BP: 111/59  Pulse: 80  Temp:   Resp: 16    Gen: chronically ill in NAD HEENT: NAD Neck: JVP not visualized Chest: no wheezes, faint bibasilar crackles Cardiac: RRR s M ZOX:WRUEA, soft, NT, NABS Ext: chronic stasis changes, trace symmetric ankle edema  BMET    Component Value Date/Time   NA 136 02/03/2012 0455   K 4.0 02/03/2012 0455   CL 92* 02/03/2012 0455   CO2 34* 02/03/2012 0455   GLUCOSE 166* 02/03/2012 0455   BUN 33* 02/03/2012 0455   CREATININE 1.20* 02/03/2012 0455   CALCIUM 8.8 02/03/2012 0455   GFRNONAA 46* 02/03/2012 0455   GFRAA 53* 02/03/2012 0455    CBC    Component Value Date/Time   WBC 9.7 02/03/2012 0455   RBC 5.11 02/03/2012 0455   HGB 10.6* 02/03/2012 0455   HCT 38.0 02/03/2012 0455   PLT 161 02/03/2012 0455   MCV 74.4* 02/03/2012 0455   MCH 20.7* 02/03/2012 0455   MCHC 27.9* 02/03/2012 0455   RDW 19.1* 02/03/2012 0455   LYMPHSABS 1.6 02/01/2012 1335   MONOABS 1.0 02/01/2012 1335   EOSABS 0.0 02/01/2012 1335   BASOSABS 0.0 02/01/2012 1335    CXR: Interstital prominence and vasc cong. No acute infiltrates ECHO: - Left ventricle: The cavity size was normal. Wall thickness was normal. Systolic function was normal. The estimated ejection fraction was in the range of 60% to 65%. Wall motion was normal; there were no regional wall motion abnormalities. Doppler parameters are consistent with abnormal left ventricular relaxation (grade 1 diastolic dysfunction). - Aortic valve: AV is thickened and calcified. Difficult to see well. Peak and mean gradients through the valve are 33 and 19 mm Hg respectively consistent with mild AS. - Right ventricle: The  cavity size was mildly dilated. Systolic function was mildly to moderately reduced. - Right atrium: The atrium was mildly to moderately dilated. - Pulmonary arteries: PA peak pressure: 32mm Hg (S).    IMPRESSION: Hypercarbic RF due to injudicious use of opioids/anxiolytics - resolved COPD - no acute bronchospasm Secondary PAH - doubt PE Smoker Minimally positive troponins - non diagnostic Acute renal insufficiency - resolving Mild LE edema - likely due to chronic lung disease and secondary PAH. Doubt DVT DMII Doubt PNA Acute TME due to hypercarbia - resolved  PLAN/RECS:  Transfer to med-surg Cont BDs D/C abx D/C heparin LE dopplers today Smoking cessation counseling Begin daily Lasix and spironolactone Resume outpt DM meds Cont SSI Anticipate D/C to home soon I do not think that a cardiac W/U is indicated Cont cardiac meds per Cards   Billy Fischer, MD ; [facility] service Mobile [phone].  After 5:30 PM or weekends, call [phone]

## 2012-02-04 LAB — GLUCOSE, CAPILLARY
Glucose-Capillary: 106 mg/dL — ABNORMAL HIGH (ref 70–99)
Glucose-Capillary: 115 mg/dL — ABNORMAL HIGH (ref 70–99)
Glucose-Capillary: 198 mg/dL — ABNORMAL HIGH (ref 70–99)
Glucose-Capillary: 86 mg/dL (ref 70–99)

## 2012-02-04 LAB — POCT I-STAT 3, ART BLOOD GAS (G3+)
TCO2: 39 mmol/L (ref 0–100)
pH, Arterial: 7.286 — ABNORMAL LOW (ref 7.350–7.450)

## 2012-02-04 MED ORDER — TIOTROPIUM BROMIDE MONOHYDRATE 18 MCG IN CAPS
18.0000 ug | ORAL_CAPSULE | Freq: Every day | RESPIRATORY_TRACT | Status: DC
  Administered 2012-02-05 – 2012-02-07 (×3): 18 ug via RESPIRATORY_TRACT
  Filled 2012-02-04: qty 5

## 2012-02-04 MED ORDER — BUDESONIDE-FORMOTEROL FUMARATE 160-4.5 MCG/ACT IN AERO
2.0000 | INHALATION_SPRAY | Freq: Two times a day (BID) | RESPIRATORY_TRACT | Status: DC
  Administered 2012-02-04 – 2012-02-07 (×6): 2 via RESPIRATORY_TRACT
  Filled 2012-02-04: qty 6

## 2012-02-04 MED ORDER — ALBUTEROL SULFATE HFA 108 (90 BASE) MCG/ACT IN AERS: 2.0000 | INHALATION_SPRAY | Freq: Two times a day (BID) | RESPIRATORY_TRACT | Status: DC

## 2012-02-04 MED ORDER — GABAPENTIN 300 MG PO CAPS
300.0000 mg | ORAL_CAPSULE | Freq: Every day | ORAL | Status: DC
  Administered 2012-02-04 – 2012-02-06 (×3): 300 mg via ORAL
  Filled 2012-02-04 (×4): qty 1

## 2012-02-04 NOTE — Progress Notes (Signed)
Hypoglycemic Event  CBG: 64  Treatment: 15 GM carbohydrate snack  Symptoms: None  Follow-up CBG: Time:2229 CBG Result:68  Possible Reasons for Event: Medication regime  Comments/MD notified:none   Jillian Bright, Jillian Bright  Remember to initiate Hypoglycemia Order Set & complete

## 2012-02-04 NOTE — Progress Notes (Signed)
Pt profile:  66 yo female smoker admitted by hospitalist on 02/01/2012 with generalized weakness, acute encephalopathy, and acute renal failure with acute on chronic hypoxic/hypercapnic respiratory failure.  Transferred to ICU 9/06 due to respiratory status deterioration, and PCCM assumed management. 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  Tests/events: 12/25/11 PFT>>FEV1 1.17 (52%), FEV1% 58, TLC 3.67 (70%), DLCO 50%, +BD 9/06 CT head>>chronic white matter infarct on Lt 9/06 Abd u/s>>no hydronephrosis, normal liver, gallbladder sludge 9/07 Echo>>EF 60 to 65%, grade 1 diastolic dysfx, mild AS, mild/mod RV systolic dysfx, mild/mod RA dilation, PAS 32 mmHg 9/08 B/L doppler legs>>no DVT  Cultures: 9/06 Urine>>E coli (pan-sensitive) 9/07 Blood>>  Antibiotics: 9/06 zithromax>>9/07 9/06 rocephin>>9/07   Subjective:   Fully awake, alert and appropriate. No complaints of chest pain or dyspnea   Objective: Filed Vitals:   02/04/12 0540  BP: 106/63  Pulse: 84  Temp: 97.9 F (36.6 C)  Resp: 18   Wt Readings from Last 3 Encounters:  02/04/12 208 lb 8.9 oz (94.6 kg)  01/14/12 206 lb (93.441 kg)  12/25/11 206 lb (93.441 kg)   I/O last 3 completed shifts: In: 1099.5 [P.O.:480; I.V.:309.5; IV Piggyback:310] Out: 2450 [Urine:2450]  Gen: chronically ill in NAD, co dizziness and anxiety from nebulizers HEENT: NAD Neck: JVP not visualized Chest: no wheezes,  Cardiac: RRR s M ZOX:WRUEA, soft, NT, NABS Ext: chronic stasis changes, trace symmetric ankle edema   Lab 02/03/12 0455 02/02/12 0526 02/01/12 1335  NA 136 133* 131*  K 4.0 4.2 4.0  CL 92* 89* 85*  CO2 34* 35* 34*  BUN 33* 40* 43*  CREATININE 1.20* 1.90* 2.29*  GLUCOSE 166* 200* 81    Lab 02/03/12 0455 02/01/12 1335  HGB 10.6* 11.8*  HCT 38.0 40.0  WBC 9.7 9.9  PLT 161 195   BNP (last 3 results)  Basename  02/03/12 0500 02/01/12 1723 10/29/11 1407  PROBNP 3575.0* 10554.0* 55.0    ABG    Component Value Date/Time   PHART 7.360 02/01/2012 2339   PCO2ART 66.2* 02/01/2012 2339   PO2ART 69.0* 02/01/2012 2339   HCO3 37.5* 02/01/2012 2339   TCO2 39 02/01/2012 2339   O2SAT 92.0 02/01/2012 2339    Lab Results  Component Value Date   HGBA1C 7.6* 02/02/2012   CBG (last 3)   Basename 02/04/12 0006 02/03/12 2334 02/03/12 2300  GLUCAP 86 65* 63*    US Abdomen Complete  02/02/2012  *RADIOLOGY REPORT*  Clinical Data:  Acute kidney injury, elevated LFTs  ABDOMINAL ULTRASOUND COMPLETE  Comparison:  None.  Findings:  Gallbladder:  Echogenic material without associated some shadowing layers within the gallbladder lumen consistent with sludge.  Per the sonographer, the sonographic Murphy's sign was negative.  Wall thickness is at the upper limits of normal at 3.8 mm.  No pericholecystic fluid.  Common Bile Duct:  Within normal limits in caliber at 3.6 mm.  Liver: No focal mass lesion identified.  Within normal limits in parenchymal echogenicity.  IVC:  Appears normal.  Pancreas:  No abnormality identified.  Spleen:  Within normal limits in size and echotexture.  Right kidney:  Normal in size and parenchymal echogenicity.  No evidence of mass or hydronephrosis. Length equals 11.2 cm  Left kidney:  Normal in size and parenchymal echogenicity.  No evidence of mass or hydronephrosis. Right thecal.  0.2 cm  Abdominal Aorta:  No aneurysm identified, although the distal abdominal  aorta is not visualized.  Maximal diameter 1.9 cm.  IMPRESSION:  1.  No evidence of hydronephrosis, or other renal abnormality. 2.  Unremarkable sonographic appearance of the liver. 3.  Small amount of sludge noted layering within the gallbladder without sonographic signs to suggest acute cholecystitis.   Original Report Authenticated By: Alvino Blood Chest Port 1 View  02/03/2012  *RADIOLOGY REPORT*  Clinical Data: Respiratory failure.  PORTABLE CHEST - 1  VIEW  Comparison: Chest x-ray 02/01/2012.  Findings: Lung volumes are normal.  Linear bibasilar opacities are favored to represent areas of subsegmental atelectasis.  No definite consolidative airspace disease.  No definite pleural effusions.  Pulmonary venous congestion with very mild indistinctness of the interstitial markings, suggesting some mild interstitial pulmonary edema.  Mild cardiomegaly is unchanged. Mediastinal contours are otherwise unremarkable.  Atherosclerosis in the thoracic aorta.  IMPRESSION: 1.  Appearance of the chest is suggestive of mild congestive heart failure, as above. 2.  Atherosclerosis. 3.  Minimal bibasilar subsegmental atelectasis.   Original Report Authenticated By: Florencia Reasons, M.D.     IMPRESSION:  A: Acute on chronic respiratory failure with hx of COPD, OSA/OHS (intolerant of CPAP).  Likely exacerbated by narcotic use, and continued smoking. P: Continue oxygen to keep SpO2 > 90% Continue symbicort, spiriva Bronchial hygiene  A: NSTEMI from demand ischemia with hx of mod AS.  Cardiology signed off 9/09. P: Continue asa, atorvastatin, lasix, aldactone  A: Secondary pulmonary hypertension from COPD, OSA/OHS, AS, diastolic CHF.  Not candidate for specific PAH therapy. P: Continue to optimize therapy for secondary causes of this>>limited by her inability to use CPAP  A: Acute renal insufficiency much improved. P: F/u BMET Monitor I/O  A: DM type II with episodes of hypoglycemia. P: Continue glucotrol, glucophage  A: Peripheral neuropathy P: Restarted neurotin at 300 mg qhs and adjust up as needed  A: Acute metabolic encephalopathy 2nd to hypercapnia, hypoxia, renal insufficiency, and narcotic use. P: Limit benzo's, narcotics  A: UTI with E coli.  Completed therapy with Abx. P: Monitor  A: Elevated LFT's.  Abd u/s negative. P: F/u LFT's May need to hold statin if persistently elevated  A: Anemia P: F/u CBC  A: Lt upper lobe lung  nodule and borderline mediastinal adenopathy. P: Will need f/u CT chest with contrast in November 2013  A: GERD P: Continue protonix while in hospital  [facility] Minor ACNP Adolph Pollack PCCM Pager [phone] till 3 pm If no answer page [phone] 02/04/2012, 1:36 PM  Reviewed above, examined pt, and agree with assessment/plan.  Coralyn Helling, MD [facility] Pulmonary/Critical Care 02/04/2012, 2:28 PM Pager:  [phone] After 3pm call: [phone]

## 2012-02-04 NOTE — Progress Notes (Signed)
Inpatient Diabetes Program Recommendations  AACE/ADA: New Consensus Statement on Inpatient Glycemic Control  Target Ranges:  Prepandial:   less than 140 mg/dL      Peak postprandial:   less than 180 mg/dL (1-2 hours)      Critically ill patients:  140 - 180 mg/dL  Pager:  [phone] Hours:  8 am-10pm   Reason for Visit: Hypoglycemia last pm.  Inpatient Diabetes Program Recommendations Oral Agents: D/C oral agents while inpatient.  Consider basal/bolus therapy while in hospital.  Alfredia Client PhD, RN, BC-ADM Diabetes Coordinator  Office:  [phone] Team Pager:  [phone]

## 2012-02-04 NOTE — Progress Notes (Signed)
SUBJECTIVE: Still feels weak.   BP 106/63  Pulse 84  Temp 97.9 F (36.6 C) (Oral)  Resp 18  Ht 5' 6.14" (1.68 m)  Wt 208 lb 8.9 oz (94.6 kg)  BMI 33.52 kg/m2  SpO2 97% No intake or output data in the 24 hours ending 02/04/12 1202  PHYSICAL EXAM General: Well developed, well nourished, in no acute distress. Alert and oriented x 3.  Psych:  Good affect, responds appropriately Neck: No JVD. No masses noted.  Lungs: Clear bilaterally with no wheezes or rhonci noted.  Heart: RRR with no murmurs noted. Abdomen: Bowel sounds are present. Soft, non-tender.  Extremities: No lower extremity edema.   LABS: Basic Metabolic Panel:  Basename 02/03/12 0455 02/02/12 0526  NA 136 133*  K 4.0 4.2  CL 92* 89*  CO2 34* 35*  GLUCOSE 166* 200*  BUN 33* 40*  CREATININE 1.20* 1.90*  CALCIUM 8.8 8.7  MG -- --  PHOS -- --   CBC:  Basename 02/03/12 0455 02/01/12 1335  WBC 9.7 9.9  NEUTROABS -- 7.3  HGB 10.6* 11.8*  HCT 38.0 40.0  MCV 74.4* 73.8*  PLT 161 195   Cardiac Enzymes:  Basename 02/02/12 2256 02/02/12 1631 02/02/12 1108 02/01/12 1723  CKTOTAL -- -- -- 5137*  CKMB -- -- -- 33.1*  CKMBINDEX -- -- -- --  TROPONINI 0.42* 0.36* 0.36* --    Current Meds:    . ipratropium  0.5 mg Nebulization QID   And  . albuterol  2.5 mg Nebulization QID  . antiseptic oral rinse  15 mL Mouth Rinse q12n4p  . aspirin EC  81 mg Oral Daily  . atorvastatin  80 mg Oral q1800  . enoxaparin (LOVENOX) injection  40 mg Subcutaneous Q24H  . furosemide  40 mg Oral Daily  . glipiZIDE  10 mg Oral BID AC  . glucose-Vitamin C      . insulin aspart  0-15 Units Subcutaneous TID WC & HS  . metFORMIN  1,000 mg Oral BID WC  . pantoprazole  40 mg Oral Q1200  . spironolactone  50 mg Oral BID  . DISCONTD: pantoprazole (PROTONIX) IV  40 mg Intravenous Daily   Echo: 02/02/12:  Left ventricle: The cavity size was normal. Wall thickness was normal. Systolic function was normal. The estimated ejection  fraction was in the range of 60% to 65%. Wall motion was normal; there were no regional wall motion abnormalities. Doppler parameters are consistent with abnormal left ventricular relaxation (grade 1 diastolic dysfunction). - Aortic valve: AV is thickened and calcified. Difficult to see well. Peak and mean gradients through the valve are 33 and 19 mm Hg respectively consistent with mild AS. - Right ventricle: The cavity size was mildly dilated. Systolic function was mildly to moderately reduced. - Right atrium: The atrium was mildly to moderately dilated. - Pulmonary arteries: PA peak pressure: 32mm Hg (S).    ASSESSMENT AND PLAN:  1. VDRF: Resolved. ? Secondary to hypercarbic respiratory failure with COPD, OSA and ? Injudicious use of narcotics. Per primary team (Pulmonary).   2. Acute on chronic diastolic CHF:  Volume status is stable. She appears euvolemic. Continue current meds. Her home dose of Lasix is 80 mg po BID. Now that renal function has normalized, should be ok to restart her home dose of Lasix.  3. HTN: BP controlled.   4. Mild elevation troponin: Likely demand ischemia  in setting of metabolic illness with respiratory failure. No chest pain. No ischemic  testing is recommended at this time.   We will sign off. Please call with questions. She can f/u with Dr. Shirlee Latch as scheduled in our office.       Jillian Bright  9/9/201312:02 PM

## 2012-02-04 NOTE — Clinical Documentation Improvement (Signed)
CHF DOCUMENTATION CLARIFICATION QUERY  THIS DOCUMENT IS NOT A PERMANENT PART OF THE MEDICAL RECORD  TO RESPOND TO THE THIS QUERY, FOLLOW THE INSTRUCTIONS BELOW:  1. If needed, update documentation for the patient's encounter via the notes activity.  2. Access this query again and click edit on the In [employer].  3. After updating, or not, click F2 to complete all highlighted (required) fields concerning your review. Select "additional documentation in the medical record" OR "no additional documentation provided".  4. Click Sign note button.  5. The deficiency will fall out of your In Basket *Please let us know if you are not able to complete this workflow by phone or e-mail (listed below).  Please update your documentation within the medical record to reflect your response to this query.                                                                                    02/04/12  Dear Dr. [facility],  In a better effort to capture your patient's severity of illness, reflect appropriate length of stay and utilization of resources, a review of the patient medical record has revealed the following indicators the diagnosis of Heart Failure.    Based on your clinical judgment, please clarify and document in a progress note and/or discharge summary the clinical condition associated with the following supporting information:  In responding to this query please exercise your independent judgment.  The fact that a query is asked, does not imply that any particular answer is desired or expected.  Possible Clinical Conditions?  Acute Systolic Congestive Heart Failure Acute Diastolic Congestive Heart Failure Acute Systolic & Diastolic Congestive Heart Failure Acute on Chronic Systolic Congestive Heart Failure Acute on Chronic Diastolic Congestive Heart Failure Acute on Chronic Systolic & Diastolic  Congestive Heart Failure Chronic Systolic Congestive Heart Failure Chronic Diastolic  Congestive Heart Failure Chronic Systolic & Diastolic Congestive Heart Failure Other Condition Cannot Clinically Determine  Supporting Information:  Risk Factors:(As per notes)  Hx pulmonary HTN, CHF, Respiratory Failure  Signs & Symptoms:(As per notes) "Acute/chronic heart failure"   Diagnostics: Labs=  BNP:    1d ago (02/03/12)=3575 3d ago (02/01/12)=10554  Echo = - Left ventricle: The cavity size was normal. Wall thickness was normal. Systolic function was normal. The estimated ejection fraction was in the range of 60% to 65%. Wall motion was normal; there were no regional wall motion abnormalities. Doppler parameters are consistent with abnormal left ventricular relaxation (grade 1 diastolic dysfunction). - Aortic valve: AV is thickened and calcified. Difficult to see well. Peak and mean gradients through the valve are 33 and 19 mm Hg respectively consistent with mild AS. - Right ventricle: The cavity size was mildly dilated. Systolic function was mildly to moderately reduced. - Right atrium: The atrium was mildly to moderately dilated. - Pulmonary arteries: PA peak pressure: 32mm Hg (S).   Treatment:(As per notes on 9-8) Begin daily Lasix and spironolactone   Reviewed:  no additional documentation provided I have not seen this pt. Please contact the rounding MD for this information  Shan Levans   Thank You,  Joanette Gula Delk  Clinical Documentation Specialist: [phone]  Pager  Health Information Management [place]

## 2012-02-04 NOTE — Progress Notes (Signed)
Hypoglycemic Event  CBG: 2300-63  Treatment: 3 glucose tabs  Symptoms: None  Follow-up CBG: Time:0000 CBG Result:86  Possible Reasons for Event: Medication regimen:   Comments/MD notified:none    Jillian Bright, Jillian Bright  Remember to initiate Hypoglycemia Order Set & complete

## 2012-02-05 ENCOUNTER — Encounter (HOSPITAL_COMMUNITY): Payer: Self-pay

## 2012-02-05 DIAGNOSIS — N179 Acute kidney failure, unspecified: Secondary | ICD-10-CM

## 2012-02-05 LAB — MAGNESIUM: Magnesium: 1.5 mg/dL (ref 1.5–2.5)

## 2012-02-05 LAB — COMPREHENSIVE METABOLIC PANEL
Albumin: 2.9 g/dL — ABNORMAL LOW (ref 3.5–5.2)
BUN: 16 mg/dL (ref 6–23)
Chloride: 96 mEq/L (ref 96–112)
Creatinine, Ser: 0.86 mg/dL (ref 0.50–1.10)
GFR calc Af Amer: 79 mL/min — ABNORMAL LOW (ref >90)
Total Bilirubin: 0.6 mg/dL (ref 0.3–1.2)

## 2012-02-05 LAB — CBC
MCV: 76.6 fL — ABNORMAL LOW (ref 78.0–100.0)
Platelets: 176 10*3/uL (ref 150–400)
RBC: 5.46 MIL/uL — ABNORMAL HIGH (ref 3.87–5.11)
WBC: 8.4 10*3/uL (ref 4.0–10.5)

## 2012-02-05 LAB — GLUCOSE, CAPILLARY
Glucose-Capillary: 155 mg/dL — ABNORMAL HIGH (ref 70–99)
Glucose-Capillary: 135 mg/dL — ABNORMAL HIGH (ref 70–99)
Glucose-Capillary: 94 mg/dL (ref 70–99)

## 2012-02-05 MED ORDER — LIVING WELL WITH DIABETES BOOK
Freq: Once | Status: AC
  Administered 2012-02-05: 16:00:00
  Filled 2012-02-05: qty 1

## 2012-02-05 MED ORDER — FUROSEMIDE 40 MG PO TABS
40.0000 mg | ORAL_TABLET | Freq: Once | ORAL | Status: AC
  Administered 2012-02-05: 40 mg via ORAL
  Filled 2012-02-05: qty 1

## 2012-02-05 NOTE — Progress Notes (Signed)
Inpatient Diabetes Program Recommendations  AACE/ADA: New Consensus Statement on Inpatient Glycemic Control (2013)  Target Ranges:  Prepandial:   less than 140 mg/dL      Peak postprandial:   less than 180 mg/dL (1-2 hours)      Critically ill patients:  140 - 180 mg/dL   Reason for Visit: Consults received  Inpatient Diabetes Program Recommendations Oral Agents: Takes Glucotrol 10 mg BID and Metformin 1000 BID Outpatient Referral: Receptive to OP Diabetes Education at [facility]  Note: Patient admitted with encephalopathy.  States that she still does not feel "right" in her head, but apparently has had cognitive improvement since admission.  Patient also states that she feels anxious.  States that she thinks she may have taken too much pain medication in an effort to relieve neuropathy pain in both feet.  She states that she feels needle-like pain in feet daily.  Is awaiting evaluations to determine if she will be going home after discharge, or if she needs some form of rehabilitation.    When asked to explain diabetes in her own words, she said that her body is not making something that it needs to function.  Discussed with her that the glucotrol she takes helps her own pancreas produce more insulin and that metformin helps reduce resistance to insulin.  Patient states that she has never fully understood diabetes.  Discussed some questions she has regarding various foods.  At home reports that CBG's are sometimes in the 100's, but other times as high as 300-- they go up and down.  Emphasized that consistency with her food intake is best in helping to control diabetes.  Will ask a dietitian to see.      Patient trying to read materials given to her by her nurse, but she only has one of her contacts in place and is using a friend's reading glasses.  Will also get the diabetes booklet, Living Well with Diabetes, for her.  Showed her how to access the Patient  Education Channel.  Patient does seem to have some difficulty with memory, but when she really tries, she seems to be able to remember details.  Able to access the Patient Education Channel without difficulty by following written directions.  Patient seems overwhelmed regarding care of her diabetes.  Patient receptive to OP Diabetes Education follow-up after discharge.  Will place OP referral to [facility] for appointment after she gets home.  (If she goes for rehab, would be deferred until after rehab.)  Patient has concerns regarding finances.  Asked her to check regarding estimated coverage of OP diabetes education by her Medicare supplemental policy.  Informed her that Medicare would pay 80%.  Is a retired Interior and spatial designer, but states that she still has to work some to meet her financial obligations.  Wonder if financial concerns may be contributing to her anxiety.

## 2012-02-05 NOTE — Progress Notes (Signed)
Pt profile:  66 yo female smoker admitted by hospitalist on 02/01/2012 with generalized weakness, acute encephalopathy, and acute renal failure with acute on chronic hypoxic/hypercapnic respiratory failure.  Transferred to ICU 9/06 due to respiratory status deterioration, and PCCM assumed management. 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  Tests/events: 12/25/11 PFT>>FEV1 1.17 (52%), FEV1% 58, TLC 3.67 (70%), DLCO 50%, +BD 9/06 CT head>>chronic white matter infarct on Lt 9/06 Abd u/s>>no hydronephrosis, normal liver, gallbladder sludge 9/07 Echo>>EF 60 to 65%, grade 1 diastolic dysfx, mild AS, mild/mod RV systolic dysfx, mild/mod RA dilation, PAS 32 mmHg 9/08 B/L doppler legs>>no DVT  Cultures: 9/06 Urine>>E coli (pan-sensitive) 9/07 Blood>>  Antibiotics: 9/06 zithromax>>9/07 9/06 rocephin>>9/07   Subjective:   Fully awake, alert and appropriate. No complaints of chest pain or dyspnea. Wants to learn about diabetes.    Objective: Filed Vitals:   02/05/12 0611  BP: 112/62  Pulse: 91  Temp: 98.3 F (36.8 C)  Resp: 16   Wt Readings from Last 3 Encounters:  02/05/12 208 lb 5.4 oz (94.5 kg)  01/14/12 206 lb (93.441 kg)  12/25/11 206 lb (93.441 kg)      Gen: chronically ill in NAD, remains with dull affect HEENT: NAD Neck: JVP not visualized Chest: no wheezes, rales Cardiac: RRR s M RUE:AVWUJ, soft, NT, NABS Ext: chronic stasis changes, trace symmetric ankle edema   Lab 02/05/12 0455 02/03/12 0455 02/02/12 0526  NA 141 136 133*  K 3.5 4.0 4.2  CL 96 92* 89*  CO2 37* 34* 35*  BUN 16 33* 40*  CREATININE 0.86 1.20* 1.90*  GLUCOSE 104* 166* 200*     Lab 02/05/12 0455 02/03/12 0455 02/01/12 1335  HGB 11.5* 10.6* 11.8*  HCT 41.8 38.0 40.0  WBC 8.4 9.7 9.9  PLT 176 161 195    Lab Results  Component Value Date   ALT 85* 02/05/2012   AST 37 02/05/2012   ALKPHOS  68 02/05/2012   BILITOT 0.6 02/05/2012    BNP (last 3 results)  Basename 02/05/12 0455 02/03/12 0500 02/01/12 1723  PROBNP 2664.0* 3575.0* 10554.0*    ABG    Component Value Date/Time   PHART 7.360 02/01/2012 2339   PCO2ART 66.2* 02/01/2012 2339   PO2ART 69.0* 02/01/2012 2339   HCO3 37.5* 02/01/2012 2339   TCO2 39 02/01/2012 2339   O2SAT 92.0 02/01/2012 2339    Lab Results  Component Value Date   HGBA1C 7.6* 02/02/2012    CBG (last 3)   Basename 02/05/12 0824 02/04/12 2136 02/04/12 1743  GLUCAP 155* 106* 115*    No results found.  IMPRESSION:  A: Acute on chronic respiratory failure with hx of COPD, OSA/OHS (intolerant of CPAP).  Likely exacerbated by narcotic use, and continued smoking. P: Continue oxygen to keep SpO2 > 90% Continue symbicort, spiriva Bronchial hygiene  A: NSTEMI from demand ischemia with hx of mod AS.  Cardiology signed off 9/09. 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  Not candidate for specific PAH therapy. P: Continue to optimize therapy for secondary causes of this>>limited by her inability to use CPAP  A: Acute renal insufficiency much improved. Lab Results  Component Value Date   CREATININE 0.86 02/05/2012   CREATININE 1.20* 02/03/2012   CREATININE 1.90* 02/02/2012   P: F/u BMET Monitor I/O  A: DM type II with episodes  of hypoglycemia. P: Continue glucotrol, glucophage Diabetic education ordered 9/10  A: Peripheral neuropathy P: Restarted neurotin at 300 mg qhs and adjust up as needed  A: Acute metabolic encephalopathy 2nd to hypercapnia, hypoxia, renal insufficiency, and narcotic use. P: Limit benzo's, narcotics  A: UTI with E coli.  Completed therapy with Abx. P: Monitor  A: Elevated LFT's.  Abd u/s negative.  Improved. P: F/u LFT's as needed  A: Anemia P: F/u CBC  A: Lt upper lobe lung nodule and borderline mediastinal  adenopathy. P: Will need f/u CT chest with contrast in November 2013  A: GERD P: Continue protonix while in hospital  [facility] Minor ACNP Adolph Pollack PCCM Pager [phone] till 3 pm If no answer page [phone] 02/05/2012, 8:34 AM   Reviewed above, examined pt and agree with assessment/plan.  She is getting close to d/c home.  Need to have assessment with PT/OT, diabetic educator.  Also need to have inpt from case manager about home health needs.  Coralyn Helling, MD [facility] Pulmonary/Critical Care 02/05/2012, 10:25 AM Pager:  [phone] After 3pm call: [phone]

## 2012-02-05 NOTE — Progress Notes (Signed)
Per patient she has watched all of the diabetes video on TV. Rn gave patient the Living Well with Diabetes and she said that she is having trouble reading because she lost one of her contacts. She says that it gives her a headache. Rn reinforced that the material doesn't all have to be read tonight and just at her leisurely to read. Pt stated that she thoroughly enjoyed Patti the diabetes coordinator. Rn will continue to monitor patient for any needs

## 2012-02-06 DIAGNOSIS — I509 Heart failure, unspecified: Secondary | ICD-10-CM

## 2012-02-06 DIAGNOSIS — G9340 Encephalopathy, unspecified: Secondary | ICD-10-CM

## 2012-02-06 DIAGNOSIS — J9600 Acute respiratory failure, unspecified whether with hypoxia or hypercapnia: Secondary | ICD-10-CM

## 2012-02-06 DIAGNOSIS — N179 Acute kidney failure, unspecified: Secondary | ICD-10-CM

## 2012-02-06 DIAGNOSIS — N390 Urinary tract infection, site not specified: Secondary | ICD-10-CM

## 2012-02-06 LAB — GLUCOSE, CAPILLARY
Glucose-Capillary: 97 mg/dL (ref 70–99)
Glucose-Capillary: 158 mg/dL — ABNORMAL HIGH (ref 70–99)
Glucose-Capillary: 70 mg/dL (ref 70–99)

## 2012-02-06 LAB — BASIC METABOLIC PANEL
BUN: 14 mg/dL (ref 6–23)
Calcium: 9.6 mg/dL (ref 8.4–10.5)
Creatinine, Ser: 0.87 mg/dL (ref 0.50–1.10)
GFR calc Af Amer: 78 mL/min — ABNORMAL LOW (ref >90)
GFR calc non Af Amer: 67 mL/min — ABNORMAL LOW (ref >90)
Glucose, Bld: 124 mg/dL — ABNORMAL HIGH (ref 70–99)
Potassium: 3.3 mEq/L — ABNORMAL LOW (ref 3.5–5.1)

## 2012-02-06 MED ORDER — HYDROCODONE-ACETAMINOPHEN 5-325 MG PO TABS
1.0000 | ORAL_TABLET | Freq: Four times a day (QID) | ORAL | Status: DC | PRN
  Administered 2012-02-06 – 2012-02-07 (×2): 1 via ORAL
  Filled 2012-02-06 (×2): qty 1

## 2012-02-06 MED ORDER — POTASSIUM CHLORIDE CRYS ER 20 MEQ PO TBCR
40.0000 meq | EXTENDED_RELEASE_TABLET | Freq: Two times a day (BID) | ORAL | Status: AC
  Administered 2012-02-06 (×2): 40 meq via ORAL
  Filled 2012-02-06 (×2): qty 2

## 2012-02-06 NOTE — Progress Notes (Signed)
Pt profile:  66 yo female smoker admitted by hospitalist on 02/01/2012 with generalized weakness, acute encephalopathy, and acute renal failure with acute on chronic hypoxic/hypercapnic respiratory failure.  Transferred to ICU 9/06 due to respiratory status deterioration, and PCCM assumed management. 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  Tests/events: 12/25/11 PFT>>FEV1 1.17 (52%), FEV1% 58, TLC 3.67 (70%), DLCO 50%, +BD 9/06 CT head>>chronic white matter infarct on Lt 9/06 Abd u/s>>no hydronephrosis, normal liver, gallbladder sludge 9/07 Echo>>EF 60 to 65%, grade 1 diastolic dysfx, mild AS, mild/mod RV systolic dysfx, mild/mod RA dilation, PAS 32 mmHg 9/08 B/L doppler legs>>no DVT  Cultures: 9/06 Urine>>E coli (pan-sensitive) 9/07 Blood>>  Antibiotics: 9/06 zithromax>>9/07 9/06 rocephin>>9/07   Subjective:   Fully awake, alert and appropriate. No complaints of chest pain or dyspnea.  Appreciate input from diabetes educator.   Objective: Filed Vitals:   02/06/12 0544  BP: 102/62  Pulse: 84  Temp: 97.6 F (36.4 C)  Resp: 18   Wt Readings from Last 3 Encounters:  02/06/12 206 lb 5.6 oz (93.6 kg)  01/14/12 206 lb (93.441 kg)  12/25/11 206 lb (93.441 kg)   I/O last 3 completed shifts: In: 680 [P.O.:680] Out: 700 [Urine:700]  Gen: chronically ill in NAD, remains with dull affect HEENT: NAD Neck: JVP not visualized Chest: no wheezes, rales Cardiac: RRR s M WGN:FAOZH, soft, NT, NABS Ext: chronic stasis changes, trace symmetric ankle edema(decrerased (9/11)   Lab 02/06/12 0550 02/05/12 0455 02/03/12 0455  NA 140 141 136  K 3.3* 3.5 4.0  CL 94* 96 92*  CO2 39* 37* 34*  BUN 14 16 33*  CREATININE 0.87 0.86 1.20*  GLUCOSE 124* 104* 166*     Lab 02/05/12 0455 02/03/12 0455 02/01/12 1335  HGB 11.5* 10.6* 11.8*  HCT 41.8 38.0 40.0  WBC 8.4 9.7 9.9  PLT 176 161  195    Lab Results  Component Value Date   ALT 85* 02/05/2012   AST 37 02/05/2012   ALKPHOS 68 02/05/2012   BILITOT 0.6 02/05/2012    BNP (last 3 results)  Basename 02/06/12 0550 02/05/12 0455 02/03/12 0500  PROBNP 2139.0* 2664.0* 3575.0*    ABG    Component Value Date/Time   PHART 7.360 02/01/2012 2339   PCO2ART 66.2* 02/01/2012 2339   PO2ART 69.0* 02/01/2012 2339   HCO3 37.5* 02/01/2012 2339   TCO2 39 02/01/2012 2339   O2SAT 92.0 02/01/2012 2339    Lab Results  Component Value Date   HGBA1C 7.6* 02/02/2012    CBG (last 3)   Basename 02/06/12 0756 02/05/12 2156 02/05/12 1707  GLUCAP 169* 94 122*    IMPRESSION:  A: Acute on chronic respiratory failure with hx of COPD, OSA/OHS (intolerant of CPAP).  Likely exacerbated by narcotic use, and continued smoking. P: Continue oxygen to keep SpO2 > 90% Continue symbicort, spiriva Bronchial hygiene Humidify O2  A: NSTEMI from demand ischemia with hx of mod AS.  Cardiology signed off 9/09. 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  Not candidate for specific PAH therapy. P: Continue to optimize therapy for secondary causes of this>>limited by her inability to use CPAP  A: Acute renal insufficiency much improved. Lab Results  Component Value Date   CREATININE 0.87 02/06/2012   CREATININE 0.86 02/05/2012   CREATININE 1.20* 02/03/2012   P: F/u BMET Monitor  I/O  A: DM type II with episodes of hypoglycemia. P: Continue glucotrol, glucophage Diabetic education ordered 9/10 and seen on 9/10  A: Peripheral neuropathy P: Restarted neurotin at 300 mg qhs and adjust up as needed  A: Acute metabolic encephalopathy 2nd to hypercapnia, hypoxia, renal insufficiency, and narcotic use. P: Limit benzo's, narcotics Needs cpap to treat underlying OSA  A: UTI with E coli.  Completed therapy with Abx. P: Monitor  A: Elevated LFT's.   Abd u/s negative.  Improved. P: F/u LFT's as needed  A: Anemia P: F/u CBC  A: Lt upper lobe lung nodule and borderline mediastinal adenopathy. P: Will need f/u CT chest with contrast in November 2013  A: GERD P: Continue protonix while in hospital  Global: Consider short term snf for rehab pending input from PT/OT  [facility] Minor ACNP Adolph Pollack PCCM Pager [phone] till 3 pm If no answer page [phone] 02/06/2012, 8:46 AM  Reviewed above, examined pt, and agree with assessment/plan.  Likely d/c home 9/12.  Coralyn Helling, MD [facility] Pulmonary/Critical Care 02/06/2012, 9:04 AM Pager:  [phone] After 3pm call: [phone]

## 2012-02-06 NOTE — Care Management Note (Signed)
  Page 2 of 2   02/06/2012     3:51:23 PM   CARE MANAGEMENT NOTE 02/06/2012  Patient:  Jillian Bright, Jillian Bright   Account Number:  [id number]  Date Initiated:  02/05/2012  Documentation initiated by:  Ronny Flurry  Subjective/Objective Assessment:   acute encephalopathy, and acute renal failure with acute on chronic hypoxic/hypercapnic respiratory failure.   NSTEMI     Action/Plan:   has home oxygen at 4 L [place]   Anticipated DC Date:     Anticipated DC Plan:           Choice offered to / List presented to:             Status of service:   Medicare Important Message given?   (If response is "NO", the following Medicare IM given date fields will be blank) Date Medicare IM given:   Date Additional Medicare IM given:    Discharge Disposition:    Per UR Regulation:  Reviewed for med. necessity/level of care/duration of stay  If discussed at Long Length of Stay Meetings, dates discussed:    Comments:  02-06-12 Patient interested in assistance at home i.e. cooking , cleaning , explained she would have to arrange those services and it would be private pay. Provided patient with list of private pay agencies.  Patient's vistor interested in having someone call her daily to check on her , thinks program is called " [employer]".   Researched great calls program it is through [employer] , you have to have a phone through [employer] anbd then it is additional charge of $4.00 / month . Phone number to set up service is [phone].   Patients friend at bedside Jule wants patient to have short term rehab at discharge. Patient refuses SNF / rehab. PT recommended no PT follow up.  Patient wanting a 4 wheel rollator walker .  Sabino Snipes with  [facility] , they do not have any in hospital , but may have one by late afternoon tomorrow or Jill Alexanders could order one for the patient and have it delivered to home but this may take a few days .  However, patient can get a prescription and go to [facility]  store on [facility] and would have more selection.   Patient would like a prescription and she will go to [facility] 's store to get her walker .  Patient would also like a HHRN to check on her . MD if you agree please order and complete face to face. If ordered patient would like to use [facility] , she has her oxygen through there.   Ronny Flurry RN BSN [phone]

## 2012-02-06 NOTE — Progress Notes (Signed)
eLink Physician-Brief Progress Note Patient Name: Jillian Bright DOB: 09-May-1946 MRN: [id number]  Date of Service  02/06/2012   HPI/Events of Note     eICU Interventions  Resumed hydrocodone-home med Pt c/o 8/10 pain    Intervention Category Minor Interventions: Routine modifications to care plan (e.g. PRN medications for pain, fever)  Cayde Held V. 02/06/2012, 10:04 PM

## 2012-02-06 NOTE — Progress Notes (Signed)
Notified on call PA regarding pt's BO of 89/56. No orders received at this time. Will continue to monitor pt closely.  Juliane Lack, RN

## 2012-02-06 NOTE — Progress Notes (Signed)
Physical Therapy Evaluation Patient Details Name: Jillian Bright MRN: [id number] DOB: 18-Nov-1945 Today's Date: 02/06/2012 Time: 7829-5621 PT Time Calculation (min): 30 min  PT Assessment / Plan / Recommendation Clinical Impression  Pt is 66 yo female with balance abnormality from neuropathy as well as energy conservation issues.  Recommend 4-wheel rollator with seat as well as acute PT to address these issues.  Pt will not likely need PT after d/c.    PT Assessment  Patient needs continued PT services    Follow Up Recommendations  No PT follow up    Barriers to Discharge Decreased caregiver support      Equipment Recommendations  Other (comment) (4-wheel rollator)    Recommendations for Other Services     Frequency Min 3X/week    Precautions / Restrictions Precautions Precautions: Fall;Other (comment) (O2 dependent) Precaution Comments: pt checks her O2 at home with a O2 sat monitor and sometimes does not use O2 at rest and when showering Restrictions Weight Bearing Restrictions: No   Pertinent Vitals/Pain No c/o pain O2 sat 98% on 4L O2 after ambulation      Mobility  Bed Mobility Bed Mobility: Not assessed Transfers Transfers: Sit to Stand;Stand to Sit Sit to Stand: 6: Modified independent (Device/Increase time);From chair/3-in-1 Stand to Sit: 6: Modified independent (Device/Increase time);To chair/3-in-1 Ambulation/Gait Ambulation/Gait Assistance: 4: Min guard;6: Modified independent (Device/Increase time) Ambulation Distance (Feet): 200 Feet Assistive device: Rolling walker;None Ambulation/Gait Assistance Details: pt ambulated 100' with no AD and min A, pt reported feeling off balance but she attributed it to this therapist holding gait belt, however, after I let go, she continued to have occasional stagger in her gait with self-correction, more frequently to right than left.  Pt then attributed this to bilateral neuropathy that is currently worse in hands and feet.   Without AD, pt ambulates with "slap foot" gait typical of neuropathy.  Pt ambulated 150' with RW and increased pace as well as stability and was mod I with RW.  Pt educated on use of RW for energy conservation and improved balance.  Pt in agreement that RW would increase her safety.  Recommend 4-wheel rollator with seat. Gait Pattern: Step-through pattern;Right foot flat;Left foot flat;Wide base of support Gait velocity: decreased with no AD, WFL with RW Stairs: No Wheelchair Mobility Wheelchair Mobility: No    Exercises Other Exercises Other Exercises: discussed community exercise programs available to her after d/c, ie silver sneakers   PT Diagnosis: Abnormality of gait  PT Problem List: Decreased balance;Decreased activity tolerance;Cardiopulmonary status limiting activity PT Treatment Interventions: DME instruction;Gait training;Stair training;Functional mobility training;Therapeutic activities;Therapeutic exercise;Balance training;Patient/family education   PT Goals Acute Rehab PT Goals PT Goal Formulation: With patient Time For Goal Achievement: 02/13/12 Potential to Achieve Goals: Good Pt will Ambulate: >150 feet;with modified independence;with rolling walker;with gait velocity >(comment) ft/second (>2.62 ft/sec) PT Goal: Ambulate - Progress: Goal set today Pt will Go Up / Down Stairs: 3-5 stairs;with rail(s);with modified independence PT Goal: Up/Down Stairs - Progress: Goal set today Pt will Perform Home Exercise Program: Independently PT Goal: Perform Home Exercise Program - Progress: Goal set today  Visit Information  Last PT Received On: 02/06/12 Assistance Needed: +1    Subjective Data  Subjective: I have been falling at home and could hardly get up Patient Stated Goal: return home   Prior Functioning  Home Living Lives With: Alone Available Help at Discharge: Friend(s) Type of Home: House Home Access: Stairs to enter [employer] of Steps: 3 Entrance  Stairs-Rails:  Right Home Layout: One level Home Adaptive Equipment: None Prior Function Level of Independence: Independent Able to Take Stairs?: Yes Driving: Yes Vocation: Retired Musician: No difficulties    Cognition  Overall Cognitive Status: Appears within functional limits for tasks assessed/performed Arousal/Alertness: Awake/alert Orientation Level: Oriented X4 / Intact Behavior During Session: WFL for tasks performed    Extremity/Trunk Assessment Right Upper Extremity Assessment RUE Sensation: History of peripheral neuropathy Left Upper Extremity Assessment LUE Sensation: History of peripheral neuropathy Right Lower Extremity Assessment RLE ROM/Strength/Tone: WFL for tasks assessed RLE Sensation: History of peripheral neuropathy RLE Coordination: WFL - gross motor Left Lower Extremity Assessment LLE ROM/Strength/Tone: WFL for tasks assessed LLE Sensation: History of peripheral neuropathy LLE Coordination: WFL - gross motor Trunk Assessment Trunk Assessment: Normal   Balance Balance Balance Assessed: Yes Dynamic Standing Balance Dynamic Standing - Balance Support: No upper extremity supported;During functional activity Dynamic Standing - Level of Assistance: 5: Stand by assistance  End of Session PT - End of Session Equipment Utilized During Treatment: Gait belt;Oxygen Activity Tolerance: Patient tolerated treatment well Patient left: in chair;with call bell/phone within reach Nurse Communication: Mobility status  GP    Lyanne Co, PT  Acute Rehab Services  [phone]  Lyanne Co 02/06/2012, 12:46 PM

## 2012-02-06 NOTE — Progress Notes (Signed)
  RD consulted for nutrition education regarding diabetes.   Lab Results  Component Value Date   HGBA1C 7.6* 02/02/2012    Pt not ready for education at time of visit- having strong conversation with family/friends r/t discharge plans.  Pt states she is excited to be attending the October Diabetes classes.  When asked if she has steps/goals for diabetes management prior to starting class, pt states "No".  Pt seems overwhelmed and frustrated.  RD gave her a few simple things she can do to better manage between now and October.  Discussed importance of controlled and consistent carbohydrate intake throughout the day. Provided examples of ways to balance meals/snacks and encouraged intake of high-fiber, whole grain complex carbohydrates.  Recommended pt decrease intake of simple sugars. Pt seemed to appreciate this approach stating, "that's helpful, all other education seems so vague and I'm still in a brain fog."    Expect good compliance.  Pt seems very motivated.  Body mass index is 33.16 kg/(m^2). Pt meets criteria for Obese based on current BMI.  Current diet order is CHO Modified Medium, patient consumed 100% of lunch today, reports otherwise intake is variable. Labs and medications reviewed. No further nutrition interventions warranted at this time. RD contact information provided. If additional nutrition issues arise, please re-consult RD.  Loyce Dys, MS RD LDN Clinical Inpatient Dietitian Pager: [phone] Weekend/After hours pager: [phone]

## 2012-02-06 NOTE — Progress Notes (Signed)
Occupational Therapy Evaluation Patient Details Name: Jillian Bright MRN: [id number] DOB: 10-Sep-1945 Today's Date: 02/06/2012 Time: 1191-4782 OT Time Calculation (min): 25 min  OT Assessment / Plan / Recommendation Clinical Impression  Pt admitted with acute encephalopathy.  Pt is near baseline but would benefit from acute OT services to address below problem list in prep for d/c home.    OT Assessment  Patient needs continued OT Services    Follow Up Recommendations  No OT follow up;Supervision - Intermittent    Barriers to Discharge      Equipment Recommendations  None recommended by OT (pt choosing to purchase shower chair in store)    Recommendations for Other Services    Frequency  Min 2X/week    Precautions / Restrictions Precautions Precautions: Fall;Other (comment) (O2 dependent) Precaution Comments: pt checks her O2 at home with a O2 sat monitor and sometimes does not use O2 at rest and when showering   Pertinent Vitals/Pain See vitals    ADL  Eating/Feeding: Performed;Independent Where Assessed - Eating/Feeding: Chair Grooming: Performed;Wash/dry hands;Supervision/safety Where Assessed - Grooming: Unsupported standing Toilet Transfer: Performed;Min guard Statistician Method: Sit to Barista: Comfort height toilet Toileting - Clothing Manipulation and Hygiene: Performed;Supervision/safety Where Assessed - Engineer, mining and Hygiene: Sit to stand from 3-in-1 or toilet Equipment Used:  (HHA) Transfers/Ambulation Related to ADLs: min guard for safety ADL Comments: Pt limited by dizziness (low BP). Requesting to go to bathroom but attempting to do so unassisted.  Educated on pt on need for assistance for safety due to low BP. Discussed with pt use of shower chair in tub during showers since pt does not wear O2 during shower (seat for energy conservation). Pt will be going to medical supply store for rollator upon d/c and  reports that she would like to shop there for a shower chair at that time.      OT Diagnosis: Generalized weakness  OT Problem List: Decreased activity tolerance;Decreased strength;Impaired balance (sitting and/or standing);Decreased knowledge of use of DME or AE OT Treatment Interventions: Self-care/ADL training;DME and/or AE instruction;Therapeutic activities;Patient/family education;Balance training   OT Goals Acute Rehab OT Goals OT Goal Formulation: With patient Time For Goal Achievement: 02/13/12 Potential to Achieve Goals: Good ADL Goals Pt Will Transfer to Toilet: with modified independence;Ambulation;with DME;Comfort height toilet ADL Goal: Toilet Transfer - Progress: Goal set today Pt Will Perform Tub/Shower Transfer: Tub transfer;with modified independence;Ambulation;with DME;Shower seat with back ADL Goal: Web designer - Progress: Goal set today Miscellaneous OT Goals Miscellaneous OT Goal #1: Pt will be able to verbalize 3 energy conservation techniques that she can generalize during ADLs. OT Goal: Miscellaneous Goal #1 - Progress: Goal set today  Visit Information  Last OT Received On: 02/06/12 Assistance Needed: +1    Subjective Data      Prior Functioning  Vision/Perception  Home Living Lives With: Alone Available Help at Discharge: Friend(s) Type of Home: House Home Access: Stairs to enter Secretary/administrator of Steps: 3 Entrance Stairs-Rails: Right Home Layout: One level Bathroom Shower/Tub: Engineer, manufacturing systems: Standard Home Adaptive Equipment: None Prior Function Level of Independence: Independent Able to Take Stairs?: Yes Driving: Yes Vocation: Retired      IT consultant  Overall Cognitive Status: Appears within functional limits for tasks assessed/performed Arousal/Alertness: Awake/alert Orientation Level: Oriented X4 / Intact Behavior During Session: [facility] for tasks performed Cognition - Other Comments: Slight decreased  awareness of need for assistance.    Extremity/Trunk Assessment Right Upper Extremity Assessment RUE  ROM/Strength/Tone: [facility] for tasks assessed Left Upper Extremity Assessment LUE ROM/Strength/Tone: [facility] for tasks assessed   Mobility  Shoulder Instructions  Transfers Sit to Stand: 5: Supervision;From chair/3-in-1;From toilet Stand to Sit: 5: Supervision;To chair/3-in-1;To toilet Details for Transfer Assistance: supervision for safety       Exercise     Balance     End of Session    GO   02/06/2012 Cipriano Mile OTR/L Pager [phone] Office [phone]  Cipriano Mile 02/06/2012, 5:30 PM

## 2012-02-07 LAB — BASIC METABOLIC PANEL
Chloride: 96 mEq/L (ref 96–112)
Creatinine, Ser: 0.82 mg/dL (ref 0.50–1.10)
GFR calc Af Amer: 84 mL/min — ABNORMAL LOW (ref >90)
Sodium: 139 mEq/L (ref 135–145)

## 2012-02-07 LAB — GLUCOSE, CAPILLARY: Glucose-Capillary: 168 mg/dL — ABNORMAL HIGH (ref 70–99)

## 2012-02-07 LAB — MAGNESIUM: Magnesium: 1.1 mg/dL — ABNORMAL LOW (ref 1.5–2.5)

## 2012-02-07 NOTE — Progress Notes (Signed)
Pt given dc instructions. Rn went over medications and incision/wound care. Pt verbalized understanding and had no questions regarding care of instructions.  

## 2012-02-07 NOTE — Discharge Summary (Signed)
Physician Discharge Summary  Patient ID: Jillian Bright MRN: [id number] DOB/AGE: [birth year] 68 y.o.  Admit date: 02/01/2012 Discharge date: 02/07/2012  Problem List Principal Problem:  *Acute encephalopathy Active Problems:  DIABETES MELLITUS, TYPE II  OBSTRUCTIVE SLEEP APNEA  HYPERTENSION  PULMONARY HYPERTENSION  DEPENDENT EDEMA  CAD (coronary artery disease)  Aortic stenosis  Acute respiratory failure  AKI (acute kidney injury)  Elevated troponin  Encephalopathy acute  Hypercarbia  HPI: 66 yo female smoker admitted by hospitalist on 02/01/2012 with generalized weakness, acute encephalopathy, and acute renal failure with acute on chronic hypoxic/hypercapnic respiratory failure. Transferred to ICU 9/06 due to respiratory status deterioration, and PCCM assumed management.  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  Hospital Course: Cultures:  9/06 Urine>>E coli (pan-sensitive)  9/07 Blood>>  Antibiotics:  9/06 zithromax>>9/07  9/06 rocephin>>9/07  Subjective:  Fully awake, alert and appropriate. No complaints of chest pain or dyspnea. Appreciate input from diabetes educator.   Gen: chronically ill in NAD, remains with dull affect  HEENT: NAD  Neck: JVP not visualized  Chest: no wheezes, rales  Cardiac: RRR s M  GMW:NUUVO, soft, NT, NABS  Ext: chronic stasis changes, trace symmetric ankle edema(decrerased (9/11)  BNP (last 3 results)   Basename  02/06/12 0550  02/05/12 0455  02/03/12 0500   PROBNP  2139.0*  2664.0*  3575.0*    CBG (last 3)   Basename  02/06/12 0756  02/05/12 2156  02/05/12 1707   GLUCAP  169*  94  122*    IMPRESSION:  A: Acute on chronic respiratory failure with hx of COPD, OSA/OHS (intolerant of CPAP). Likely exacerbated by narcotic use, and continued smoking.  P:  Continue oxygen to keep SpO2 > 90%  Continue symbicort, spiriva  Bronchial  hygiene  Humidify O2  Resumed narcotics but not xanax A: NSTEMI from demand ischemia with hx of mod AS. Cardiology signed off 9/09.  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 Not candidate for specific PAH therapy.  P:  Continue to optimize therapy for secondary causes of this>>limited by her inability to use CPAP  A: Acute renal insufficiency much improved.  Lab Results   Component  Value  Date    CREATININE  0.87  02/06/2012    CREATININE  0.86  02/05/2012    CREATININE  1.20*  02/03/2012    Filed Weights   02/04/12 0540 02/05/12 0611 02/06/12 0544  Weight: 208 lb 8.9 oz (94.6 kg) 208 lb 5.4 oz (94.5 kg) 206 lb 5.6 oz (93.6 kg)   P:  F/u BMET  Monitor I/O  A: DM type II with episodes of hypoglycemia.  P:  Continue glucotrol, glucophage  Diabetic education ordered 9/10 and seen on 9/10  A: Peripheral neuropathy  P:  Restarted neurotin A: Acute metabolic encephalopathy 2nd to hypercapnia, hypoxia, renal insufficiency, and narcotic use.  P:  Limit benzo's, narcotics  Needs cpap to treat underlying OSA  A: UTI with E coli. Completed therapy with Abx.  P:  Monitor  A: Elevated LFT's. Abd u/s negative. Improved.  P:  F/u LFT's as needed  A: Anemia P:  F/u CBC  A: Lt upper lobe lung nodule and borderline mediastinal adenopathy.  P:  Will need f/u CT chest with contrast in November 2013  A: GERD  P:  Resume home treatment  Labs at discharge Lab Results  Component Value Date   CREATININE 0.82 02/07/2012   BUN 18 02/07/2012   NA 139 02/07/2012   K 4.5 02/07/2012   CL 96 02/07/2012   CO2 36* 02/07/2012   Lab Results  Component Value Date   WBC 8.4 02/05/2012   HGB 11.5* 02/05/2012   HCT 41.8 02/05/2012   MCV 76.6* 02/05/2012   PLT 176 02/05/2012   Lab Results  Component Value Date   ALT 85* 02/05/2012   AST 37 02/05/2012   ALKPHOS 68 02/05/2012   BILITOT  0.6 02/05/2012   Lab Results  Component Value Date   INR 1.24 02/01/2012   INR 1.1 ratio* 05/26/2009    Current radiology studies No results found.  Disposition:    Discharge Orders    Future Appointments: Provider: Department: Dept Phone: Center:   02/12/2012 1:30 PM Jillian Helling, MD [facility] [phone] None   02/26/2012 10:30 AM Jillian Berry May, RN, RD, CDE [facility] [phone] NDM     Future Orders Please Complete By Expires   Ambulatory referral to Nutrition and Diabetic Education      Comments:   Patient admitted with encephalopathy.  Hgb A1C is 7.6 on 02/02/2012.  States she thinks she took too much pain med to relieve unrelenting neuropathy pain in feet.  Patient seems overwhelmed about understanding diabetes.  Would probably need 1:1 at least initially due to high anxiety.  Bright need to go for short-term placement at SNF or Assisted Living-- awaiting assessments regarding needs.  Of course, if goes for temporary placement, will appt for education deferred until after she returns home.  Has financial concerns.  Informed her that Medicare will cover 80%.  Suggested she call her secondary insurer to find out coverage.  Cell phone number is [phone].   Discharge patient          Medication List     As of 02/07/2012  8:37 AM    STOP taking these medications         ALPRAZolam 0.5 MG tablet   Commonly known as: XANAX      NEOMYCIN-POLYMYXIN-HC (OPHTH) Susp      TAKE these medications         albuterol 108 (90 BASE) MCG/ACT inhaler   Commonly known as: PROVENTIL HFA;VENTOLIN HFA   Inhale 2 puffs into the lungs every 6 (six) hours as needed. For wheezing      aspirin EC 81 MG tablet   Take 1 tablet (81 mg total) by mouth daily.      budesonide-formoterol 160-4.5 MCG/ACT inhaler   Commonly known as: SYMBICORT   Inhale 2 puffs into the lungs 2 (two) times daily.      furosemide 40 MG tablet   Commonly known as: LASIX   Take 80 mg by mouth 2  (two) times daily.      gabapentin 300 MG capsule   Commonly known as: NEURONTIN   Take 300 mg by mouth 2 (two) times daily.      glipiZIDE 10 MG tablet   Commonly known as: GLUCOTROL   Take 10 mg by mouth 2 (two) times daily before a meal.      guaiFENesin 600 MG 12 hr tablet   Commonly known as: MUCINEX   Take 1,200 mg by mouth 2 (two) times daily as needed. For congestion      Hydrocodone-Acetaminophen 10-660 MG Tabs   Take 1 tablet by mouth every 6 (six) hours as  needed. For pain      metFORMIN 1000 MG tablet   Commonly known as: GLUCOPHAGE   Take 1,000 mg by mouth 2 (two) times daily with a meal.      omeprazole 40 MG capsule   Commonly known as: PRILOSEC   Take 40 mg by mouth daily.      potassium chloride SA 20 MEQ tablet   Commonly known as: K-DUR,KLOR-CON   Take 20 mEq by mouth 2 (two) times daily.      rosuvastatin 40 MG tablet   Commonly known as: CRESTOR   Take 40 mg by mouth daily.      solifenacin 5 MG tablet   Commonly known as: VESICARE   Take 10 mg by mouth daily.      spironolactone 25 MG tablet   Commonly known as: ALDACTONE   Take 25 mg by mouth daily.      tiotropium 18 MCG inhalation capsule   Commonly known as: SPIRIVA   Place 18 mcg into inhaler and inhale daily.           Follow-up Information    Follow up with Jillian Fairburn, MD. On 02/12/2012. (1:30 pm)    Contact information:   [address] [facility] [place] [address] [phone]       Follow up with Jillian Salisbury, MD. (follow up soon)    Contact information:   [address] Christena Flake [facility] [place] [address] [phone]          Discharged Condition: {fairVital signs at Discharge. Temp:  [97.8 F (36.6 C)-99.5 F (37.5 C)] 98.5 F (36.9 C) (09/12 0520) Pulse Rate:  [81-89] 86  (09/12 0520) Resp:  [18-20] 18  (09/12 0520) BP: (85-123)/(52-70) 115/70 mmHg (09/12 0520) SpO2:  [97 %-99 %] 97 % (09/12 0520)  Office follow up Special Information or  instructions. Follow up with Dr. Clent Ridges for adjustments to narcotics and anxiolytics. Xanax held on discharge. Untreated OSA remains an issue. Rollin walker has been supplied per Physical therapy recommendations. Signed: Brett Canales Bright ACNP Jillian Bright PCCM Pager [phone] till 3 pm If no answer page [phone] 02/07/2012, 8:37 AM  Jillian Helling, MD

## 2012-02-07 NOTE — Progress Notes (Signed)
PT Cancellation Note  Treatment cancelled today due to patient's refusal to participate. Pt has been discharged home and is in process of getting ready (dressing, calling ride, ect.). Pt feels good with yesterday's PT session and has no questions. Reinforced pacing with pt at home for energy conservation. Also, inquired about the 4 wheel walker that the primary PT had recommended and pt reports she has the prescription for it.  Sallyanne Kuster 02/07/2012, 11:01 AM  Sallyanne Kuster, PTA Office- [phone]

## 2012-02-08 LAB — CULTURE, BLOOD (ROUTINE X 2)

## 2012-02-12 ENCOUNTER — Inpatient Hospital Stay: Payer: Medicare Other | Admitting: Pulmonary Disease

## 2012-02-13 ENCOUNTER — Telehealth: Payer: Self-pay | Admitting: Pulmonary Disease

## 2012-02-13 NOTE — Telephone Encounter (Signed)
[id number]. Dr. Craige Cotta first available is 10-17. If pt is sick she can be scheduled another MD for acute visit. Carron Curie, CMA

## 2012-02-14 NOTE — Telephone Encounter (Signed)
lmomtcb x 2  

## 2012-02-15 NOTE — Telephone Encounter (Signed)
I spoke with Jillian Bright and she was calling to reschedule her HFU. I scheduled her to see TP on 9/26/413 at 11:30 since VS was booked up and doubled some days. Will forward to Dr. Craige Cotta so he is aware of this.

## 2012-02-15 NOTE — Telephone Encounter (Signed)
Noted  

## 2012-02-15 NOTE — Telephone Encounter (Signed)
Returning call can be reached at (h) [phone] or (c) [phone].Jillian Bright

## 2012-02-15 NOTE — Telephone Encounter (Signed)
lmomtcb  

## 2012-02-21 ENCOUNTER — Encounter: Payer: Self-pay | Admitting: Adult Health

## 2012-02-21 ENCOUNTER — Ambulatory Visit: Payer: Medicare Other | Admitting: Family Medicine

## 2012-02-21 ENCOUNTER — Ambulatory Visit (INDEPENDENT_AMBULATORY_CARE_PROVIDER_SITE_OTHER): Payer: Medicare Other | Admitting: Adult Health

## 2012-02-21 VITALS — BP 106/68 | HR 102 | Temp 96.9°F | Ht 66.0 in | Wt 205.8 lb

## 2012-02-21 DIAGNOSIS — J449 Chronic obstructive pulmonary disease, unspecified: Secondary | ICD-10-CM

## 2012-02-21 DIAGNOSIS — I2789 Other specified pulmonary heart diseases: Secondary | ICD-10-CM

## 2012-02-21 DIAGNOSIS — G4733 Obstructive sleep apnea (adult) (pediatric): Secondary | ICD-10-CM

## 2012-02-21 DIAGNOSIS — Z23 Encounter for immunization: Secondary | ICD-10-CM

## 2012-02-21 NOTE — Assessment & Plan Note (Addendum)
Encouraged on weight loss. Intolerant to CPAP Continue on nocturnal oxygen

## 2012-02-21 NOTE — Progress Notes (Signed)
  Subjective:    Patient ID: Jillian Bright, female    DOB: [birth year], 66 y.o.   MRN: [id number]  HPI 66 yo female with known COPD, OSA/OHS unable to tolerate CPAP, moderate aortic stenosis, chronic diastolic heart failure and secondary pulmonary hypertension. Using 4 liters oxygen 24/7. Former smoker-, quit 02/01/12   02/21/2012 Post Hospital follow up  Patient returns for a post hospital followup. Admitted September 26 through 02/07/2012 for acute on chronic hypercarbic and hypoxemic respiratory failure complicated by urinary tract infection. She was treated with aggressive pulmonary hygiene, steroids, and antibiotics She does have a history of obstructive sleep apnea, however, is intolerant to CPAP. She was noticed to have a pump in her cardiac enzymes consistent with a NSTEMI secondary to demand ischemia. Was seen by cardiology and managed medically  She did have some metabolic encephalopathy, felt secondary to hypercapnia, hypoxemia, renal insufficiency, and chronic narcotic use. She was recommended to limit her narcotics as much as possible and also to limit her benzodiazepine use Since discharge. She is feeling better with decreased cough and shortness of breath. However, continues to feel weak Unfortunately, she had restarted smoking prior to admission. However, says that she is off, but since discharge, and does not want to restart. We discussed smoking cessation  Review of Systems Constitutional:   No  weight loss, night sweats,  Fevers, chills,  +fatigue, or  lassitude.  HEENT:   No headaches,  Difficulty swallowing,  Tooth/dental problems, or  Sore throat,                No sneezing, itching, ear ache, nasal congestion, post nasal drip,   CV:  No chest pain,  Orthopnea, PND,   anasarca, dizziness, palpitations, syncope.   GI  No heartburn, indigestion, abdominal pain, nausea, vomiting, diarrhea, change in bowel habits, loss of appetite, bloody stools.   Resp: .  No change in  color of mucus.  No wheezing.  No chest wall deformity  Skin: no rash or lesions.  GU: no dysuria, change in color of urine, no urgency or frequency.  No flank pain, no hematuria   MS:  No joint pain or swelling.  No decreased range of motion.  No back pain.  Psych:  No change in mood or affect. No depression or anxiety.  No memory loss.         Objective:   Physical Exam GEN: A/Ox3; pleasant , NAD, obese   HEENT:  [place]/AT,  EACs-clear, TMs-wnl, NOSE-clear, THROAT-clear, no lesions, no postnasal drip or exudate noted.  Class 3 airway   NECK:  Supple w/ fair ROM; no JVD; normal carotid impulses w/o bruits; no thyromegaly or nodules palpated; no lymphadenopathy.  RESP  Coarse BS w/o, wheezes/ rales/ or rhonchi.no accessory muscle use, no dullness to percussion  CARD:  RRR, no m/r/g  , tr peripheral edema, pulses intact, no cyanosis or clubbing.  GI:   Soft & nt; nml bowel sounds; no organomegaly or masses detected.  Musco: Warm bil, no deformities or joint swelling noted.   Neuro: alert, no focal deficits noted.    Skin: Warm, no lesions or rashes Venous insufficiency change with stasis dermatitis         Assessment & Plan:

## 2012-02-21 NOTE — Assessment & Plan Note (Signed)
Continue on Symbicort, and Spiriva. Continue on oxygen therapy. Patient encouraged on smoking cessation Flu shot today

## 2012-02-21 NOTE — Progress Notes (Signed)
Reviewed and agree with assessment/plan. 

## 2012-02-21 NOTE — Patient Instructions (Addendum)
Continue on current regimen.  We are proud of you for quitting smoking.  follow up Dr. Craige Cotta  In 3 weeks and As needed   Flu shot today  Order sent to [facility] to check concentrator and additional tank.

## 2012-02-21 NOTE — Assessment & Plan Note (Signed)
Continue on oxygen 

## 2012-02-22 ENCOUNTER — Ambulatory Visit (INDEPENDENT_AMBULATORY_CARE_PROVIDER_SITE_OTHER): Payer: Medicare Other | Admitting: Family Medicine

## 2012-02-22 ENCOUNTER — Encounter: Payer: Self-pay | Admitting: Family Medicine

## 2012-02-22 VITALS — BP 104/60 | HR 99 | Temp 98.0°F | Wt 205.0 lb

## 2012-02-22 DIAGNOSIS — I509 Heart failure, unspecified: Secondary | ICD-10-CM

## 2012-02-22 DIAGNOSIS — E119 Type 2 diabetes mellitus without complications: Secondary | ICD-10-CM

## 2012-02-22 DIAGNOSIS — I2789 Other specified pulmonary heart diseases: Secondary | ICD-10-CM

## 2012-02-22 DIAGNOSIS — I2510 Atherosclerotic heart disease of native coronary artery without angina pectoris: Secondary | ICD-10-CM

## 2012-02-22 DIAGNOSIS — J449 Chronic obstructive pulmonary disease, unspecified: Secondary | ICD-10-CM

## 2012-02-22 NOTE — Progress Notes (Signed)
  Subjective:    Patient ID: Jillian Bright, female    DOB: [birth year], 66 y.o.   MRN: [id number]  HPI Here to follow up after a hospital stay from 02-01-12 to 02-07-12 for acute respiratory failure and acute renal failure. This was precipitated by excessive narcotic use. She recovered well in the hospital, and was sent home on her usual 4 liters of [place] oxygen. Her renal function returned to normal. She was treated for a UTI. Now she feels fairly well, and she is going for short walks every day using her walker. She had started smoking again prior to this episode, but she proudly tells me that she has not smoked at all since her admission. She has had a flu shot.    Review of Systems  Constitutional: Negative.   Respiratory: Negative.   Cardiovascular: Negative.   Neurological: Negative.        Objective:   Physical Exam  Constitutional: She is oriented to person, place, and time. She appears well-developed and well-nourished.  Neck: No thyromegaly present.  Cardiovascular: Normal rate, regular rhythm, normal heart sounds and intact distal pulses.   Pulmonary/Chest: Effort normal and breath sounds normal.  Lymphadenopathy:    She has no cervical adenopathy.  Neurological: She is alert and oriented to person, place, and time. She has normal reflexes. A cranial nerve deficit is present. She exhibits normal muscle tone. Coordination normal.          Assessment & Plan:  She has recovered from acute respiratory and renal failure. She has quit smoking again, and I strongly urged her to not start again. She has cut back on her narcotic use, and now only takes 1 or 2 Vicodin a day. We will see her in 3 months for an A1c, etc.

## 2012-02-26 ENCOUNTER — Encounter: Payer: Medicare Other | Attending: Pulmonary Disease | Admitting: Dietician

## 2012-02-26 ENCOUNTER — Encounter: Payer: Self-pay | Admitting: Dietician

## 2012-02-26 VITALS — Ht 66.0 in | Wt 206.3 lb

## 2012-02-26 DIAGNOSIS — Z713 Dietary counseling and surveillance: Secondary | ICD-10-CM | POA: Insufficient documentation

## 2012-02-26 DIAGNOSIS — E119 Type 2 diabetes mellitus without complications: Secondary | ICD-10-CM | POA: Insufficient documentation

## 2012-02-26 NOTE — Patient Instructions (Addendum)
   Fiber: Bread= 2 gm of fiber per slice.  Cereal= 3+ gm per serving.  Sugar= (0-9 gm) per serving  Use fruit and not the juice.  Use juice for elevating a low blood sugar level.  Continue to [place] the water and Sugar Free beverages.  Read your food label.  Have a protein/meat serving at all meals and snacks.  Practice using your measuring cups and spoons.  Try to limit the carb to 30-45 gm for meals and 15 gm for snacks.  You can subtract the fiber from the total carbohydrate on the food label.  Remember fiber slows glucose release and fu=iber does not go into your blood stream.

## 2012-02-26 NOTE — Progress Notes (Signed)
  Medical Nutrition Therapy:  Appt start time: 1030 end time:  1200.   Assessment:  Primary concerns today: Comes to learn more about taking care of her diabetes.  To learn what to eat to help lower glucose. Notes that for years, " she has not attended to her diabetes and if she had, she might be in as bad a shape as I am."  HYPOGLYCEMIA: Gives not current reported S/S of low blood glucose.  HYPERGLYCEMIA: Current blood glucose levels for the last 3+ weeks are running in th 223-257 -293 range.  Does have increased thirst and tires easily.  BLOOD GLUCOSE:  Monitoring at varied times during the day.  Using an Accu-Chek Aviva meter.  Fasting: 223, 219, 225, 257  Post-Breakfast: 2223 203, 259  After Dinner: 203,259  Bedtime: 214, 249  DILATED EYE EXAM:  Currently the exam is due.  Has issues with small print. Wears contact lens.  Has been told she has a cataract.  DAILY FOOT EXAM:  Currently applying diabetic lotion.  Review of the steps for foot care.  MEDICATIONS: Med review completed.  Diabetes meds: Metformin 1000 mg, two times daily and Glipizide 10 mg ,two times daily.   DIETARY INTAKE:  Usual eating pattern includes 3 meals and 2-3 snacks per day.  Everyday foods include Starches, fruit, protein, vegetables.  Avoided foods include salty, fatty foods..    24-hr recall:  B ( AM): Water, coffee 8:00 2 eggs with 2 whole wheat toast and 2 slices of bacon and coffee. OR bowl of cereal (cheerios, about 2 cups with milk  2% about 6-8  Oz and maybe a banana OR oatmeal squares or instant oatmeal, flavored. Snk ( AM): Sometimes will have fruit or graham crackers, or vanilla waffers.  L ( PM): 1:00-1:00 Sandwich with 2 whole wheat (flat breads)meat cheese, mayo/mustard, and potato chips, sometimes nothing and maybe 8 oz milk or water or unsweetened tea with Splenda. Snk ( PM): Sometimes will have a fruit D ( PM): 7:00 Lasagna (small portion) with a salad with the balsamic vinegarrett. Ice tea  with Splenda and 2 slices of bread Snk ( PM): if eats at 7:00 PM will not have a fruit snack, Beverages: Unsweetened bread, coffee, water, milk     Usual physical activity: Currently has not been walking.  Yesterday, used her seated, rolling walker to walk in her neighborhood for about 25 minutes.  Did have to stop once to sit and rest.  Encouraged her to explore the Silver Sneakers program at the [facility]  Estimated energy needs:HT:66 in WT: 201.3 lb  BMI: 33.4 kg/m2   Adj WT:154 lb (70 kg)  1400 calories 155-160 g carbohydrates 100-105 g protein 36-39 g fat  Progress Towards Goal(s):  In progress.   Nutritional Diagnosis:  -2.1 Inpaired nutrition utilization As related to blood glucose metabolism.  As evidenced by consistently elevated blood glucose> 200, diagnosis of type 2 diabetes..    Intervention:  Nutrition Review of the CHO restricted dietary interventions for lowering blood glucose.  Review of the use of fiber, limiting sugars, and the implications for diet with exercise. And weight loss.  Handouts given during visit include:  Carbohydrate Counting Guide  Menu suggestions for 30 and 45 gm CHO for meals.  Snack list.  Foot Care Sheet  Monitoring/Evaluation:  Dietary intake, exercise, blood glucose levels, and body weight in 6-8 weeks if your insurance will pick up the remaining 80%.Marland Kitchen

## 2012-03-13 ENCOUNTER — Ambulatory Visit (INDEPENDENT_AMBULATORY_CARE_PROVIDER_SITE_OTHER): Payer: Medicare Other | Admitting: Pulmonary Disease

## 2012-03-13 ENCOUNTER — Ambulatory Visit: Payer: Medicare Other | Admitting: Pulmonary Disease

## 2012-03-13 ENCOUNTER — Encounter: Payer: Self-pay | Admitting: Pulmonary Disease

## 2012-03-13 VITALS — BP 102/70 | HR 94 | Temp 97.8°F | Ht 66.0 in | Wt 204.4 lb

## 2012-03-13 DIAGNOSIS — G4733 Obstructive sleep apnea (adult) (pediatric): Secondary | ICD-10-CM

## 2012-03-13 DIAGNOSIS — R599 Enlarged lymph nodes, unspecified: Secondary | ICD-10-CM

## 2012-03-13 DIAGNOSIS — J449 Chronic obstructive pulmonary disease, unspecified: Secondary | ICD-10-CM

## 2012-03-13 DIAGNOSIS — E662 Morbid (severe) obesity with alveolar hypoventilation: Secondary | ICD-10-CM

## 2012-03-13 DIAGNOSIS — R590 Localized enlarged lymph nodes: Secondary | ICD-10-CM

## 2012-03-13 NOTE — Assessment & Plan Note (Addendum)
She is intolerant of CPAP.  She is to continue supplemental oxygen at 2 liters 24/7.  Will arrange for portable oxygen concentrator.

## 2012-03-13 NOTE — Progress Notes (Signed)
Chief Complaint  Patient presents with  . Follow-up    pt states breathing has been doing well since last OV.     History of Present Illness: Jillian Bright is a 66 y.o. female ex-smoker with dyspnea from COPD, OSA/OHS unable to tolerate CPAP, mild aortic stenosis, chronic diastolic heart failure and secondary pulmonary hypertension.  She also has mediastinal adenopathy and Lt upper lung nodule.  She has not smoked in the past 3 months.  Her breathing is doing better.  She denies cough, wheeze, or sputum.  She is monitoring her diet more closely, and trying to exercise more.  She is not needing to use albuterol much.  Tests: PFT 06/13/09>>FEV1 1.97(76%), FEV1% 73, TLC 5.35(101%), DLCO 65% ABG 06/13/09 >>7.44/50.6/55 on room air  PSG 06/19/09 >>AHI 13  PFT 12/25/11>>FEV1 1.17 (52%), FEV1% 58, TLC 3.67 (70%), DLCO 50%, +BD CT chest 12/31/11>>emphysema, 1.5 cm precarinal node CT cervical spine 01/03/12>>11 x 8 mm LUL nodule Echo 02/02/12>>EF 60 to 65%, grade 1 diastolic dysfx, mild AS, mod RV systolic dysfx, PAS 32 mmHg   Past Medical History  Diagnosis Date  . Diabetes mellitus   . COPD (chronic obstructive pulmonary disease)     sees Dr. Coralyn Helling  . Sleep apnea, obstructive   . Pulmonary HTN   . Obesity (BMI 30.0-34.9)   . Hyperlipidemia   . Myocardial infarction     Past Surgical History  Procedure Date  . Appendectomy   . Tonsillectomy and adenoidectomy   . Lumbar disc surgery   . Hemorrhoid surgery     Outpatient Encounter Prescriptions as of 03/13/2012  Medication Sig Dispense Refill  . albuterol (PROVENTIL HFA;VENTOLIN HFA) 108 (90 BASE) MCG/ACT inhaler Inhale 2 puffs into the lungs every 6 (six) hours as needed. For wheezing      . ALPRAZolam (XANAX) 0.5 MG tablet Take 1 tablet by mouth Three times daily as needed.      Marland Kitchen aspirin EC 81 MG tablet Take 1 tablet (81 mg total) by mouth daily.      . budesonide-formoterol (SYMBICORT) 160-4.5 MCG/ACT inhaler Inhale 2  puffs into the lungs 2 (two) times daily.      . furosemide (LASIX) 40 MG tablet Take 80 mg by mouth 2 (two) times daily.      Marland Kitchen gabapentin (NEURONTIN) 300 MG capsule Take 300 mg by mouth 2 (two) times daily.      Marland Kitchen glipiZIDE (GLUCOTROL) 10 MG tablet Take 10 mg by mouth 2 (two) times daily before a meal.      . Hydrocodone-Acetaminophen 10-660 MG TABS Take 1 tablet by mouth every 6 (six) hours as needed. For pain      . metFORMIN (GLUCOPHAGE) 1000 MG tablet Take 1,000 mg by mouth 2 (two) times daily with a meal.      . omeprazole (PRILOSEC) 40 MG capsule Take 40 mg by mouth daily.      . potassium chloride SA (K-DUR,KLOR-CON) 20 MEQ tablet Take 20 mEq by mouth 2 (two) times daily.      . rosuvastatin (CRESTOR) 40 MG tablet Take 40 mg by mouth daily.      . solifenacin (VESICARE) 5 MG tablet Take 10 mg by mouth daily.      Marland Kitchen spironolactone (ALDACTONE) 25 MG tablet Take 25 mg by mouth daily.      Marland Kitchen tiotropium (SPIRIVA) 18 MCG inhalation capsule Place 18 mcg into inhaler and inhale daily.      Marland Kitchen guaiFENesin (MUCINEX) 600 MG 12  hr tablet Take 1,200 mg by mouth 2 (two) times daily as needed. For congestion        Allergies  Allergen Reactions  . Codeine     Physical Exam:  Filed Vitals:   03/13/12 1528  BP: 102/70  Pulse: 94  Temp: 97.8 F (36.6 C)  TempSrc: Oral  Height: 5\' 6"  (1.676 m)  Weight: 204 lb 6.4 oz (92.715 kg)  SpO2: 93%   Body mass index is 32.99 kg/(m^2).  Wt Readings from Last 2 Encounters:  03/13/12 204 lb 6.4 oz (92.715 kg)  02/26/12 206 lb 4.8 oz (93.577 kg)    General - No distress, wearing oxygen ENT - no sinus tenderness, no oral exudate, no LAN, no thyromegaly Cardiac - s1s2 regular, 2/6 SM Chest - normal respiratory excursion, prolonged expiratory phase, no wheeze, faint basilar rales Back - no focal tenderness Abd - soft, non-tender, + bowel sounds Ext - normal motor strength Neuro - Cranial nerves are normal. PERLA. EOM's intact. Skin - no  discernible active dermatitis, erythema, urticaria or inflammatory process. Psych - normal mood, and behavior.   Assessment/Plan:  Coralyn Helling, MD [facility]/Critical Care/Sleep Pager:  [phone] 03/13/2012, 3:38 PM

## 2012-03-13 NOTE — Patient Instructions (Signed)
Will arrange for CT chest for November 2013>>will call with results Follow up in 6 months

## 2012-03-13 NOTE — Assessment & Plan Note (Signed)
Will arrange for CT chest with contrast for November 2013.

## 2012-03-13 NOTE — Assessment & Plan Note (Signed)
Stable with her current inhaler regimen.  Much improved since she stopped smoking.

## 2012-03-16 NOTE — Assessment & Plan Note (Addendum)
She is unable to tolerate CPAP.

## 2012-03-26 ENCOUNTER — Ambulatory Visit: Payer: Medicare Other | Admitting: Pulmonary Disease

## 2012-04-07 ENCOUNTER — Other Ambulatory Visit (INDEPENDENT_AMBULATORY_CARE_PROVIDER_SITE_OTHER): Payer: Medicare Other

## 2012-04-07 DIAGNOSIS — R590 Localized enlarged lymph nodes: Secondary | ICD-10-CM

## 2012-04-07 DIAGNOSIS — R599 Enlarged lymph nodes, unspecified: Secondary | ICD-10-CM

## 2012-04-07 LAB — BASIC METABOLIC PANEL
BUN: 19 mg/dL (ref 6–23)
Chloride: 94 mEq/L — ABNORMAL LOW (ref 96–112)
Creatinine, Ser: 0.9 mg/dL (ref 0.4–1.2)
GFR: 70.69 mL/min (ref >60.00)
Glucose, Bld: 123 mg/dL — ABNORMAL HIGH (ref 70–99)
Potassium: 4.1 mEq/L (ref 3.5–5.1)

## 2012-04-14 ENCOUNTER — Ambulatory Visit (INDEPENDENT_AMBULATORY_CARE_PROVIDER_SITE_OTHER)
Admission: RE | Admit: 2012-04-14 | Discharge: 2012-04-14 | Disposition: A | Discharge status: Home / Self Care | Payer: Medicare Other | Source: Ambulatory Visit | Attending: Pulmonary Disease | Admitting: Pulmonary Disease

## 2012-04-14 DIAGNOSIS — R599 Enlarged lymph nodes, unspecified: Secondary | ICD-10-CM

## 2012-04-14 DIAGNOSIS — R590 Localized enlarged lymph nodes: Secondary | ICD-10-CM

## 2012-04-14 MED ORDER — IOHEXOL 300 MG/ML  SOLN
100.0000 mL | Freq: Once | INTRAMUSCULAR | Status: AC | PRN
  Administered 2012-04-14: 100 mL via INTRAVENOUS

## 2012-04-16 ENCOUNTER — Telehealth: Payer: Self-pay | Admitting: Pulmonary Disease

## 2012-04-16 NOTE — Telephone Encounter (Signed)
  04/14/2012  *RADIOLOGY REPORT*  Clinical Data: Former smoker, follow up pulmonary nodule  CT CHEST WITH CONTRAST  Technique:  Multidetector CT imaging of the chest was performed following the standard protocol during bolus administration of intravenous contrast.  Contrast: OMNIPAQUE IOHEXOL 300 MG/ML  SOLN  Comparison: 12/31/2011   Findings: Lungs/pleura: There is no pleural effusion.  Mild changes of centrilobular emphysema.  Plate-like atelectasis noted in the left upper lobe.  There is no pulmonary parenchymal nodule or mass.  Heart/Mediastinum: Heart size appears mildly enlarged.  No pericardial effusion.  Calcifications within the LAD and RCA coronary arteries noted.  The index precarinal lymph node measures 1.4 cm, image 20. Subcarinal lymph node measures 0.9 cm, image 25. This is stable from previous exam.  Left hilar lymph node measures 8.30 mm, image 25.  Previously 7.6 mm.  Upper abdomen: Limited imaging through the upper abdomen is unremarkable.  Bones/Musculoskeletal:   Review of the visualized osseous structures is unremarkable.   IMPRESSION: 1.Stable borderline mediastinal lymph nodes.  2.  Emphysema.  3.  There is atherosclerosis of the thoracic aorta, the great vessels of the mediastinum and the coronary arteries, including calcified atherosclerotic plaque in the LAD and RCA  coronary arteries.  Atherosclerosis, including LAD and RCA coronary artery disease. Please note that although the presence of coronary artery calcium documents the presence of coronary artery disease, the severity of this disease and any potential stenosis cannot be assessed on this non-gated CT examination.  Assessment for potential risk factor modification, dietary therapy or pharmacologic therapy may be warranted, if clinically indicated.    Original Report Authenticated By: Signa Kell, M.D.    Results d/w pt.  No change to current treatment plan.

## 2012-05-11 ENCOUNTER — Other Ambulatory Visit: Payer: Self-pay | Admitting: Family Medicine

## 2012-06-06 ENCOUNTER — Other Ambulatory Visit: Payer: Self-pay | Admitting: Pulmonary Disease

## 2012-06-06 DIAGNOSIS — R599 Enlarged lymph nodes, unspecified: Secondary | ICD-10-CM

## 2012-06-06 NOTE — Progress Notes (Signed)
Per Almyra Free, bmet needed for CT Chest.  Order placed -- [facility] informed pt.

## 2012-06-11 ENCOUNTER — Other Ambulatory Visit: Payer: Self-pay | Admitting: Family Medicine

## 2012-06-11 NOTE — Telephone Encounter (Signed)
Call in #[phone] with 5 rf 

## 2012-06-17 ENCOUNTER — Other Ambulatory Visit (INDEPENDENT_AMBULATORY_CARE_PROVIDER_SITE_OTHER): Payer: Medicare Other

## 2012-06-17 DIAGNOSIS — R599 Enlarged lymph nodes, unspecified: Secondary | ICD-10-CM

## 2012-06-17 LAB — BASIC METABOLIC PANEL
CO2: 34 mEq/L — ABNORMAL HIGH (ref 19–32)
Calcium: 9.3 mg/dL (ref 8.4–10.5)
Creatinine, Ser: 1.1 mg/dL (ref 0.4–1.2)
GFR: 50.86 mL/min — ABNORMAL LOW (ref >60.00)
Glucose, Bld: 219 mg/dL — ABNORMAL HIGH (ref 70–99)

## 2012-06-30 ENCOUNTER — Ambulatory Visit (INDEPENDENT_AMBULATORY_CARE_PROVIDER_SITE_OTHER)
Admission: RE | Admit: 2012-06-30 | Discharge: 2012-06-30 | Disposition: A | Discharge status: Home / Self Care | Payer: Medicare Other | Source: Ambulatory Visit | Attending: Pulmonary Disease | Admitting: Pulmonary Disease

## 2012-06-30 DIAGNOSIS — R599 Enlarged lymph nodes, unspecified: Secondary | ICD-10-CM

## 2012-06-30 DIAGNOSIS — R590 Localized enlarged lymph nodes: Secondary | ICD-10-CM

## 2012-06-30 MED ORDER — IOHEXOL 300 MG/ML  SOLN
80.0000 mL | Freq: Once | INTRAMUSCULAR | Status: AC | PRN
  Administered 2012-06-30: 80 mL via INTRAVENOUS

## 2012-07-01 ENCOUNTER — Telehealth: Payer: Self-pay | Admitting: Pulmonary Disease

## 2012-07-01 NOTE — Telephone Encounter (Signed)
Ct Chest W Contrast  06/30/2012  *RADIOLOGY REPORT*  Clinical Data: Follow-up mediastinal lymphadenopathy and pulmonary nodule.  History of diabetes and congestive heart failure.  CT CHEST WITH CONTRAST  Technique:  Multidetector CT imaging of the chest was performed following the standard protocol during bolus administration of intravenous contrast.  Contrast: 80mL OMNIPAQUE IOHEXOL 300 MG/ML  SOLN - The patient was given the usual instructions regarding cessation of metformin therapy related to intravenous contrast administration and is to follow up with the ordering physician.   Comparison: Chest CT 12/31/2011 and 04/14/2012.   Findings: The previously demonstrated precarinal lymph node is unchanged, measuring 1.3 cm short axis on image 25.  Additional scattered small mediastinal and hilar lymph nodes are unchanged. There is no progressive adenopathy.  Atherosclerosis of the great vessels, aorta and coronary arteries is again noted.  The thyroid gland appears normal.  There is no pleural or pericardial effusion.  Lingular atelectasis has improved.  There is no airspace disease or suspicious pulmonary nodule.  The visualized upper abdomen has a stable appearance.  Small hepatic hypodensities are unchanged.  There is no adenopathy within the upper abdomen.   IMPRESSION:   1.  Stable mediastinal lymph nodes since baseline examination of 12/31/2011.  2.  No pulmonary nodule or acute process identified.    Original Report Authenticated By: Carey Bullocks, M.D.     Will have my nurse schedule ROV to review CT chest results.

## 2012-07-02 NOTE — Telephone Encounter (Signed)
Pt returned Mindy's call.  Set appt w/ VS for 07/21/12 @ 11:45 am.  Pt would like to still speak w/ VS's nurse.  Pt really would like to get results sooner if possible & would like to know why an appt is necessary to obtain results.  Antionette Fairy

## 2012-07-02 NOTE — Telephone Encounter (Signed)
I spoke with pt and she stated she did not requesting to speak with me. She stated she will see VS and discuss results with him on 07/21/12. Nothing further was needed

## 2012-07-02 NOTE — Telephone Encounter (Signed)
lmomtcb x1 for pt 

## 2012-07-21 ENCOUNTER — Encounter: Payer: Self-pay | Admitting: Pulmonary Disease

## 2012-07-21 ENCOUNTER — Ambulatory Visit (INDEPENDENT_AMBULATORY_CARE_PROVIDER_SITE_OTHER): Payer: Medicare Other | Admitting: Pulmonary Disease

## 2012-07-21 VITALS — BP 104/56 | HR 109 | Temp 97.8°F | Ht 66.0 in | Wt 222.8 lb

## 2012-07-21 DIAGNOSIS — J438 Other emphysema: Secondary | ICD-10-CM

## 2012-07-21 DIAGNOSIS — J439 Emphysema, unspecified: Secondary | ICD-10-CM

## 2012-07-21 DIAGNOSIS — E662 Morbid (severe) obesity with alveolar hypoventilation: Secondary | ICD-10-CM

## 2012-07-21 DIAGNOSIS — R599 Enlarged lymph nodes, unspecified: Secondary | ICD-10-CM

## 2012-07-21 DIAGNOSIS — R590 Localized enlarged lymph nodes: Secondary | ICD-10-CM

## 2012-07-21 NOTE — Assessment & Plan Note (Signed)
Stable on CT chest.  No other worrisome findings.  ?of nodule in left upper lobe on CT neck no longer visualized.  No additional radiographic follow up needed.

## 2012-07-21 NOTE — Progress Notes (Signed)
Chief Complaint  Patient presents with  . Follow-up    discuss CT results. Breathing has been doing ok. In the mornings she will ocugh up clear phlem at times it will be brown, slight wheezing, no chest tx    History of Present Illness: Jillian Bright is a 67 y.o. female former smoker with COPD, and OSA/OHS unable to tolerate CPAP.  She also has mediastinal adenopathy and Lt upper lung nodule.  Her breathing has been doing better.  She has not needed to use albuterol much.  She still gets winded with exertion.  She has not smoked since September 2013.  She has trouble sleeping.  She is concerned about weight gain, and that she is replacing food for cigarettes.  She had f/u CT chest 06/30/12.   Tests: PFT 06/13/09>>FEV1 1.97(76%), FEV1% 73, TLC 5.35(101%), DLCO 65% ABG 06/13/09 >>7.44/50.6/55 on room air  PSG 06/19/09 >>AHI 13  PFT 12/25/11>>FEV1 1.17 (52%), FEV1% 58, TLC 3.67 (70%), DLCO 50%, +BD CT chest 12/31/11>>emphysema, 1.5 cm precarinal node CT cervical spine 01/03/12>>11 x 8 mm LUL nodule Echo 02/02/12>>EF 60 to 65%, grade 1 diastolic dysfx, mild AS, mod RV systolic dysfx, PAS 32 mmHg CT chest 06/30/12 >> 1.3 cm precarinal node no change   Past Medical History  Diagnosis Date  . Diabetes mellitus   . COPD (chronic obstructive pulmonary disease)     sees Dr. Coralyn Helling  . Sleep apnea, obstructive   . Pulmonary HTN   . Obesity (BMI 30.0-34.9)   . Hyperlipidemia   . Myocardial infarction     Past Surgical History  Procedure Laterality Date  . Appendectomy    . Tonsillectomy and adenoidectomy    . Lumbar disc surgery    . Hemorrhoid surgery      Outpatient Encounter Prescriptions as of 07/21/2012  Medication Sig Dispense Refill  . albuterol (PROVENTIL HFA;VENTOLIN HFA) 108 (90 BASE) MCG/ACT inhaler Inhale 2 puffs into the lungs every 6 (six) hours as needed. For wheezing      . ALPRAZolam (XANAX) 0.5 MG tablet TAKE 1 TABLET BY MOUTH 3 TIMES A DAY AS NEEDED  90  tablet  5  . aspirin EC 81 MG tablet Take 1 tablet (81 mg total) by mouth daily.      . budesonide-formoterol (SYMBICORT) 160-4.5 MCG/ACT inhaler Inhale 2 puffs into the lungs 2 (two) times daily.      . furosemide (LASIX) 40 MG tablet Take 80 mg by mouth daily.       Marland Kitchen gabapentin (NEURONTIN) 300 MG capsule Take 300 mg by mouth 2 (two) times daily.      Marland Kitchen glipiZIDE (GLUCOTROL) 10 MG tablet TAKE 1 TABLET BY MOUTH 2 TIMES A DAY  60 tablet  11  . guaiFENesin (MUCINEX) 600 MG 12 hr tablet Take 1,200 mg by mouth 2 (two) times daily as needed. For congestion      . Hydrocodone-Acetaminophen 10-660 MG TABS Take 1 tablet by mouth every 6 (six) hours as needed. For pain      . metFORMIN (GLUCOPHAGE) 1000 MG tablet Take 1,000 mg by mouth 2 (two) times daily with a meal.      . omeprazole (PRILOSEC) 40 MG capsule Take 40 mg by mouth daily.      . potassium chloride SA (K-DUR,KLOR-CON) 20 MEQ tablet Take 20 mEq by mouth 2 (two) times daily.      . rosuvastatin (CRESTOR) 40 MG tablet Take 40 mg by mouth daily.      Marland Kitchen  solifenacin (VESICARE) 5 MG tablet Take 10 mg by mouth daily.      Marland Kitchen spironolactone (ALDACTONE) 25 MG tablet Take 25 mg by mouth daily.      Marland Kitchen tiotropium (SPIRIVA) 18 MCG inhalation capsule Place 18 mcg into inhaler and inhale daily.      . [DISCONTINUED] glipiZIDE (GLUCOTROL) 10 MG tablet Take 10 mg by mouth 2 (two) times daily before a meal.       No facility-administered encounter medications on file as of 07/21/2012.    Allergies  Allergen Reactions  . Codeine     Physical Exam:  Filed Vitals:   07/21/12 1144 07/21/12 1147  BP:  104/56  Pulse:  109  Temp: 97.8 F (36.6 C)   TempSrc: Oral   Height: 5\' 6"  (1.676 m)   Weight: 222 lb 12.8 oz (101.061 kg)   SpO2:  89%   Body mass index is 35.98 kg/(m^2).  Wt Readings from Last 2 Encounters:  07/21/12 222 lb 12.8 oz (101.061 kg)  03/13/12 204 lb 6.4 oz (92.715 kg)    General - No distress, wearing oxygen ENT - no sinus  tenderness, no oral exudate, no LAN, no thyromegaly Cardiac - s1s2 regular, 2/6 SM Chest - normal respiratory excursion, prolonged expiratory phase, no wheeze, faint basilar rales Back - no focal tenderness Abd - soft, non-tender, + bowel sounds Ext - normal motor strength Neuro - Cranial nerves are normal. PERLA. EOM's intact. Skin - no discernible active dermatitis, erythema, urticaria or inflammatory process. Psych - normal mood, and behavior.   Assessment/Plan:  Coralyn Helling, MD [facility]/Critical Care/Sleep Pager:  [phone] 07/21/2012, 11:55 AM

## 2012-07-21 NOTE — Assessment & Plan Note (Signed)
Discussed with her importance of weight reduction in relation to her health problems.  She is to continue supplemental oyxgen at night, and with exertion as needed during the day.

## 2012-07-21 NOTE — Patient Instructions (Signed)
Follow up in 6 months 

## 2012-07-21 NOTE — Assessment & Plan Note (Signed)
She has been doing better since she stopped smoking.  Advised she could gradual decrease how much symbicort she is using as tolerated.  She is to continue spiriva and prn albuterol.

## 2012-07-22 ENCOUNTER — Other Ambulatory Visit: Payer: Self-pay | Admitting: Family Medicine

## 2012-08-14 ENCOUNTER — Other Ambulatory Visit: Payer: Self-pay | Admitting: Family Medicine

## 2012-08-14 MED ORDER — HYDROCODONE-ACETAMINOPHEN 10-325 MG PO TABS: 1.0000 | ORAL_TABLET | Freq: Four times a day (QID) | ORAL | Status: DC | PRN

## 2012-08-14 NOTE — Telephone Encounter (Signed)
Change this to Vicodin 10/325, call in #[phone] with 5 rf

## 2012-08-14 NOTE — Addendum Note (Signed)
Addended by: Aniceto Boss A on: 08/14/2012 01:22 PM   Modules accepted: Orders, Medications

## 2012-10-05 ENCOUNTER — Other Ambulatory Visit: Payer: Self-pay | Admitting: Family Medicine

## 2012-10-07 ENCOUNTER — Telehealth: Payer: Self-pay | Admitting: Family Medicine

## 2012-10-07 NOTE — Telephone Encounter (Signed)
Pharmacy called, pt needs refill of eye drops.

## 2012-10-08 MED ORDER — NEOMYCIN-POLYMYXIN-HC OP SUSP: 2.0000 [drp] | OPHTHALMIC | Status: DC | PRN

## 2012-10-08 NOTE — Telephone Encounter (Signed)
I sent script e-scribe. 

## 2012-10-08 NOTE — Telephone Encounter (Signed)
Call in Cortisporin ophthalmic susp. To use 2 drops to eyes q 4 hours prn, 10 ml with 2 rf

## 2012-10-12 ENCOUNTER — Other Ambulatory Visit: Payer: Self-pay | Admitting: Family Medicine

## 2012-10-13 ENCOUNTER — Telehealth: Payer: Self-pay | Admitting: Family Medicine

## 2012-10-13 NOTE — Telephone Encounter (Signed)
Refill request for Metformin 1000 mg take 1 po bid, can we refill this?

## 2012-10-13 NOTE — Telephone Encounter (Signed)
Okay for one year  

## 2012-10-14 MED ORDER — METFORMIN HCL 1000 MG PO TABS: 1000.0000 mg | ORAL_TABLET | Freq: Two times a day (BID) | ORAL | Status: DC

## 2012-10-14 NOTE — Telephone Encounter (Signed)
I sent script e-scribe. 

## 2012-10-22 ENCOUNTER — Telehealth: Payer: Self-pay | Admitting: Family Medicine

## 2012-10-22 NOTE — Telephone Encounter (Signed)
Refill request for Spironolactone 25 mg take 1 po qd, can we refill this?

## 2012-10-23 MED ORDER — SPIRONOLACTONE 25 MG PO TABS: 25.0000 mg | ORAL_TABLET | Freq: Every day | ORAL | Status: DC

## 2012-10-23 NOTE — Telephone Encounter (Signed)
Refill for one year 

## 2012-10-23 NOTE — Telephone Encounter (Signed)
I sent script e-scribe. 

## 2012-11-24 ENCOUNTER — Encounter: Payer: Self-pay | Admitting: Family Medicine

## 2012-11-24 ENCOUNTER — Ambulatory Visit (INDEPENDENT_AMBULATORY_CARE_PROVIDER_SITE_OTHER): Payer: Medicare Other | Admitting: Family Medicine

## 2012-11-24 VITALS — BP 108/60 | HR 96 | Temp 97.8°F | Wt 217.0 lb

## 2012-11-24 DIAGNOSIS — E119 Type 2 diabetes mellitus without complications: Secondary | ICD-10-CM

## 2012-11-24 DIAGNOSIS — H3412 Central retinal artery occlusion, left eye: Secondary | ICD-10-CM

## 2012-11-24 DIAGNOSIS — H3410 Central retinal artery occlusion, unspecified eye: Secondary | ICD-10-CM

## 2012-11-24 LAB — CBC WITH DIFFERENTIAL/PLATELET
Basophils Absolute: 0 10*3/uL (ref 0.0–0.1)
Eosinophils Absolute: 0.1 10*3/uL (ref 0.0–0.7)
HCT: 42.3 % (ref 36.0–46.0)
Hemoglobin: 13.5 g/dL (ref 12.0–15.0)
Lymphocytes Relative: 23.9 % (ref 12.0–46.0)
Lymphs Abs: 1.5 10*3/uL (ref 0.7–4.0)
MCHC: 31.9 g/dL (ref 30.0–36.0)
Neutro Abs: 4.3 10*3/uL (ref 1.4–7.7)
Platelets: 177 10*3/uL (ref 150.0–400.0)
RDW: 19.8 % — ABNORMAL HIGH (ref 11.5–14.6)

## 2012-11-24 LAB — HEPATIC FUNCTION PANEL
ALT: 19 U/L (ref 0–35)
AST: 22 U/L (ref 0–37)
Total Bilirubin: 0.7 mg/dL (ref 0.3–1.2)
Total Protein: 7.7 g/dL (ref 6.0–8.3)

## 2012-11-24 LAB — POCT URINALYSIS DIPSTICK
Ketones, UA: NEGATIVE
Leukocytes, UA: NEGATIVE
Urobilinogen, UA: 0.2
pH, UA: 6

## 2012-11-24 LAB — BASIC METABOLIC PANEL
CO2: 30 mEq/L (ref 19–32)
Chloride: 91 mEq/L — ABNORMAL LOW (ref 96–112)
Glucose, Bld: 209 mg/dL — ABNORMAL HIGH (ref 70–99)
Potassium: 3.7 mEq/L (ref 3.5–5.1)
Sodium: 136 mEq/L (ref 135–145)

## 2012-11-24 LAB — HEMOGLOBIN A1C: Hgb A1c MFr Bld: 9.4 % — ABNORMAL HIGH (ref 4.6–6.5)

## 2012-11-24 LAB — TSH: TSH: 1.54 u[IU]/mL (ref 0.35–5.50)

## 2012-11-24 LAB — LIPID PANEL: Total CHOL/HDL Ratio: 4

## 2012-11-24 MED ORDER — ROSUVASTATIN CALCIUM 40 MG PO TABS: 40.0000 mg | ORAL_TABLET | Freq: Every day | ORAL | Status: DC

## 2012-11-24 NOTE — Progress Notes (Signed)
  Subjective:    Patient ID: Jillian Bright, female    DOB: 1945/09/05, 67 y.o.   MRN: [id number]  HPI Here at the recommendation of her ophthalmologist, Dr. Diana Eves. 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 Megon was unaware of this and her vision has not changed. She is on a statin and an 81 mg aspirin daily. She has quit smoking but has not been checking her glucoses at all.    Review of Systems  Constitutional: Negative.   Eyes: Negative.   Respiratory: Negative.   Cardiovascular: Negative.        Objective:   Physical Exam  Constitutional: She appears well-developed and well-nourished.  Neck: No thyromegaly present.  No carotid bruits   Cardiovascular: Normal rate, regular rhythm, normal heart sounds and intact distal pulses.   Pulmonary/Chest: Effort normal and breath sounds normal.  Lymphadenopathy:    She has no cervical adenopathy.          Assessment & Plan:  We will get fasting labs today. Set up an ECHO and carotid dopplers soon.

## 2012-11-25 NOTE — Progress Notes (Signed)
Quick Note:  I left voice message for pt to return my call. ______ 

## 2012-11-26 ENCOUNTER — Other Ambulatory Visit: Payer: Self-pay | Admitting: Family Medicine

## 2012-11-26 MED ORDER — SITAGLIPTIN PHOSPHATE 100 MG PO TABS: 100.0000 mg | ORAL_TABLET | Freq: Every day | ORAL | Status: DC

## 2012-11-26 NOTE — Progress Notes (Signed)
Quick Note:  I spoke with pt and sent script e-scribe. ______ 

## 2012-11-26 NOTE — Addendum Note (Signed)
Addended by: Aniceto Boss A on: 11/26/2012 08:32 AM   Modules accepted: Orders

## 2012-11-27 ENCOUNTER — Other Ambulatory Visit: Payer: Self-pay | Admitting: *Deleted

## 2012-11-27 ENCOUNTER — Encounter (INDEPENDENT_AMBULATORY_CARE_PROVIDER_SITE_OTHER): Payer: Medicare Other

## 2012-11-27 ENCOUNTER — Other Ambulatory Visit (HOSPITAL_COMMUNITY): Payer: Medicare Other

## 2012-11-27 DIAGNOSIS — I6529 Occlusion and stenosis of unspecified carotid artery: Secondary | ICD-10-CM

## 2012-11-27 DIAGNOSIS — H3412 Central retinal artery occlusion, left eye: Secondary | ICD-10-CM

## 2012-12-01 ENCOUNTER — Encounter: Payer: Self-pay | Admitting: Vascular Surgery

## 2012-12-01 ENCOUNTER — Telehealth: Payer: Self-pay | Admitting: Family Medicine

## 2012-12-01 NOTE — Progress Notes (Signed)
Quick Note:  I spoke with pt ______ 

## 2012-12-01 NOTE — Telephone Encounter (Signed)
I spoke with pt and yes she should keep the appointment, per Dr. Clent Ridges.

## 2012-12-01 NOTE — Telephone Encounter (Signed)
Pt would like you to call her concerning findings and her upcoming appt tomorrow w/ the surgeon.She is not sure if she still needs to keep her appt and would need to cancel asap.

## 2012-12-02 ENCOUNTER — Encounter (HOSPITAL_COMMUNITY): Payer: Self-pay | Admitting: General Practice

## 2012-12-02 ENCOUNTER — Encounter: Payer: Self-pay | Admitting: Family Medicine

## 2012-12-02 ENCOUNTER — Encounter: Payer: Self-pay | Admitting: Vascular Surgery

## 2012-12-02 ENCOUNTER — Inpatient Hospital Stay (HOSPITAL_COMMUNITY)
Admission: AD | Admit: 2012-12-02 | Discharge: 2012-12-05 | DRG: 038 | Disposition: A | Discharge status: Home / Self Care | Payer: Medicare Other | Source: Ambulatory Visit | Attending: Vascular Surgery | Admitting: Vascular Surgery

## 2012-12-02 ENCOUNTER — Ambulatory Visit (INDEPENDENT_AMBULATORY_CARE_PROVIDER_SITE_OTHER): Payer: Medicare Other | Admitting: Vascular Surgery

## 2012-12-02 ENCOUNTER — Other Ambulatory Visit (HOSPITAL_COMMUNITY): Payer: Medicare Other

## 2012-12-02 DIAGNOSIS — I10 Essential (primary) hypertension: Secondary | ICD-10-CM | POA: Diagnosis present

## 2012-12-02 DIAGNOSIS — I359 Nonrheumatic aortic valve disorder, unspecified: Secondary | ICD-10-CM | POA: Diagnosis present

## 2012-12-02 DIAGNOSIS — I350 Nonrheumatic aortic (valve) stenosis: Secondary | ICD-10-CM | POA: Diagnosis present

## 2012-12-02 DIAGNOSIS — I5032 Chronic diastolic (congestive) heart failure: Secondary | ICD-10-CM | POA: Diagnosis present

## 2012-12-02 DIAGNOSIS — I6529 Occlusion and stenosis of unspecified carotid artery: Secondary | ICD-10-CM

## 2012-12-02 DIAGNOSIS — I2510 Atherosclerotic heart disease of native coronary artery without angina pectoris: Secondary | ICD-10-CM | POA: Diagnosis present

## 2012-12-02 DIAGNOSIS — I509 Heart failure, unspecified: Secondary | ICD-10-CM | POA: Diagnosis present

## 2012-12-02 DIAGNOSIS — I252 Old myocardial infarction: Secondary | ICD-10-CM

## 2012-12-02 DIAGNOSIS — E669 Obesity, unspecified: Secondary | ICD-10-CM | POA: Diagnosis present

## 2012-12-02 DIAGNOSIS — G4733 Obstructive sleep apnea (adult) (pediatric): Secondary | ICD-10-CM | POA: Diagnosis present

## 2012-12-02 DIAGNOSIS — E119 Type 2 diabetes mellitus without complications: Secondary | ICD-10-CM | POA: Diagnosis present

## 2012-12-02 DIAGNOSIS — K219 Gastro-esophageal reflux disease without esophagitis: Secondary | ICD-10-CM | POA: Diagnosis present

## 2012-12-02 DIAGNOSIS — E662 Morbid (severe) obesity with alveolar hypoventilation: Secondary | ICD-10-CM | POA: Diagnosis present

## 2012-12-02 DIAGNOSIS — E785 Hyperlipidemia, unspecified: Secondary | ICD-10-CM | POA: Diagnosis present

## 2012-12-02 DIAGNOSIS — F411 Generalized anxiety disorder: Secondary | ICD-10-CM | POA: Diagnosis present

## 2012-12-02 DIAGNOSIS — I2789 Other specified pulmonary heart diseases: Secondary | ICD-10-CM | POA: Diagnosis present

## 2012-12-02 DIAGNOSIS — I272 Pulmonary hypertension, unspecified: Secondary | ICD-10-CM | POA: Diagnosis present

## 2012-12-02 DIAGNOSIS — J438 Other emphysema: Secondary | ICD-10-CM | POA: Diagnosis present

## 2012-12-02 DIAGNOSIS — Z9861 Coronary angioplasty status: Secondary | ICD-10-CM

## 2012-12-02 DIAGNOSIS — J439 Emphysema, unspecified: Secondary | ICD-10-CM | POA: Diagnosis present

## 2012-12-02 DIAGNOSIS — Z87891 Personal history of nicotine dependence: Secondary | ICD-10-CM

## 2012-12-02 DIAGNOSIS — Z6833 Body mass index (BMI) 33.0-33.9, adult: Secondary | ICD-10-CM

## 2012-12-02 HISTORY — DX: Gastro-esophageal reflux disease without esophagitis: K21.9

## 2012-12-02 HISTORY — DX: Atherosclerotic heart disease of native coronary artery without angina pectoris: I25.10

## 2012-12-02 HISTORY — DX: Shortness of breath: R06.02

## 2012-12-02 HISTORY — DX: Anxiety disorder, unspecified: F41.9

## 2012-12-02 HISTORY — DX: Unspecified osteoarthritis, unspecified site: M19.90

## 2012-12-02 LAB — GLUCOSE, CAPILLARY
Glucose-Capillary: 181 mg/dL — ABNORMAL HIGH (ref 70–99)
Glucose-Capillary: 239 mg/dL — ABNORMAL HIGH (ref 70–99)

## 2012-12-02 MED ORDER — TIOTROPIUM BROMIDE MONOHYDRATE 18 MCG IN CAPS
18.0000 ug | ORAL_CAPSULE | Freq: Every day | RESPIRATORY_TRACT | Status: DC
  Filled 2012-12-02 (×2): qty 5

## 2012-12-02 MED ORDER — POTASSIUM CHLORIDE CRYS ER 10 MEQ PO TBCR: 20.0000 meq | EXTENDED_RELEASE_TABLET | Freq: Once | ORAL | Status: DC

## 2012-12-02 MED ORDER — INSULIN ASPART 100 UNIT/ML ~~LOC~~ SOLN
0.0000 [IU] | Freq: Every day | SUBCUTANEOUS | Status: DC
  Administered 2012-12-02: 2 [IU] via SUBCUTANEOUS

## 2012-12-02 MED ORDER — METOPROLOL TARTRATE 1 MG/ML IV SOLN: 2.0000 mg | INTRAVENOUS | Status: DC | PRN

## 2012-12-02 MED ORDER — LABETALOL HCL 5 MG/ML IV SOLN: 10.0000 mg | INTRAVENOUS | Status: DC | PRN

## 2012-12-02 MED ORDER — BUDESONIDE-FORMOTEROL FUMARATE 160-4.5 MCG/ACT IN AERO
2.0000 | INHALATION_SPRAY | Freq: Two times a day (BID) | RESPIRATORY_TRACT | Status: DC
  Administered 2012-12-02 – 2012-12-05 (×3): 2 via RESPIRATORY_TRACT
  Filled 2012-12-02 (×2): qty 6

## 2012-12-02 MED ORDER — ATORVASTATIN CALCIUM 20 MG PO TABS
20.0000 mg | ORAL_TABLET | Freq: Every day | ORAL | Status: DC
  Administered 2012-12-02 – 2012-12-04 (×3): 20 mg via ORAL
  Filled 2012-12-02 (×5): qty 1

## 2012-12-02 MED ORDER — POTASSIUM CHLORIDE CRYS ER 20 MEQ PO TBCR
20.0000 meq | EXTENDED_RELEASE_TABLET | Freq: Two times a day (BID) | ORAL | Status: DC
  Administered 2012-12-02 – 2012-12-03 (×3): 20 meq via ORAL
  Filled 2012-12-02 (×5): qty 1

## 2012-12-02 MED ORDER — GABAPENTIN 300 MG PO CAPS
300.0000 mg | ORAL_CAPSULE | Freq: Two times a day (BID) | ORAL | Status: DC
  Administered 2012-12-02 – 2012-12-05 (×5): 300 mg via ORAL
  Filled 2012-12-02 (×9): qty 1

## 2012-12-02 MED ORDER — ALPRAZOLAM 0.5 MG PO TABS
0.5000 mg | ORAL_TABLET | Freq: Two times a day (BID) | ORAL | Status: DC | PRN
  Administered 2012-12-02 – 2012-12-03 (×2): 0.5 mg via ORAL
  Filled 2012-12-02 (×3): qty 1

## 2012-12-02 MED ORDER — ALBUTEROL SULFATE HFA 108 (90 BASE) MCG/ACT IN AERS
2.0000 | INHALATION_SPRAY | Freq: Four times a day (QID) | RESPIRATORY_TRACT | Status: DC | PRN
  Filled 2012-12-02: qty 6.7

## 2012-12-02 MED ORDER — HYDROCODONE-ACETAMINOPHEN 10-325 MG PO TABS: 1.0000 | ORAL_TABLET | Freq: Four times a day (QID) | ORAL | Status: DC | PRN

## 2012-12-02 MED ORDER — GLIPIZIDE 10 MG PO TABS
10.0000 mg | ORAL_TABLET | Freq: Two times a day (BID) | ORAL | Status: DC
  Administered 2012-12-03 (×2): 10 mg via ORAL
  Filled 2012-12-02 (×8): qty 1

## 2012-12-02 MED ORDER — PANTOPRAZOLE SODIUM 20 MG PO TBEC: 40.0000 mg | DELAYED_RELEASE_TABLET | Freq: Every day | ORAL | Status: DC

## 2012-12-02 MED ORDER — SODIUM CHLORIDE 0.9 % IV SOLN: INTRAVENOUS | Status: DC

## 2012-12-02 MED ORDER — NEOMYCIN-POLYMYXIN-DEXAMETH 3.5-10000-0.1 OP SUSP
2.0000 [drp] | OPHTHALMIC | Status: DC | PRN
  Filled 2012-12-02: qty 5

## 2012-12-02 MED ORDER — INSULIN ASPART 100 UNIT/ML ~~LOC~~ SOLN
0.0000 [IU] | Freq: Three times a day (TID) | SUBCUTANEOUS | Status: DC
  Administered 2012-12-02: 3 [IU] via SUBCUTANEOUS
  Administered 2012-12-02: 5 [IU] via SUBCUTANEOUS
  Administered 2012-12-03: 2 [IU] via SUBCUTANEOUS
  Administered 2012-12-03 – 2012-12-05 (×3): 3 [IU] via SUBCUTANEOUS

## 2012-12-02 MED ORDER — ACETAMINOPHEN 80 MG RE SUPP: 325.0000 mg | RECTAL | Status: DC | PRN

## 2012-12-02 MED ORDER — PANTOPRAZOLE SODIUM 20 MG PO TBEC
40.0000 mg | DELAYED_RELEASE_TABLET | Freq: Every day | ORAL | Status: DC
  Administered 2012-12-03 – 2012-12-04 (×2): 40 mg via ORAL
  Filled 2012-12-02 (×3): qty 2

## 2012-12-02 MED ORDER — SPIRONOLACTONE 25 MG PO TABS
25.0000 mg | ORAL_TABLET | Freq: Every day | ORAL | Status: DC
  Administered 2012-12-03 – 2012-12-05 (×2): 25 mg via ORAL
  Filled 2012-12-02 (×4): qty 1

## 2012-12-02 MED ORDER — GUAIFENESIN-DM 100-10 MG/5ML PO SYRP: 15.0000 mL | ORAL_SOLUTION | ORAL | Status: DC | PRN

## 2012-12-02 MED ORDER — NEOMYCIN-POLYMYXIN-HC OP SUSP: 2.0000 [drp] | OPHTHALMIC | Status: DC | PRN

## 2012-12-02 MED ORDER — ONDANSETRON HCL 4 MG/2ML IJ SOLN: 4.0000 mg | Freq: Four times a day (QID) | INTRAMUSCULAR | Status: DC | PRN

## 2012-12-02 MED ORDER — PHENOL 1.4 % MT LIQD: 1.0000 | OROMUCOSAL | Status: DC | PRN

## 2012-12-02 MED ORDER — DARIFENACIN HYDROBROMIDE ER 7.5 MG PO TB24
7.5000 mg | ORAL_TABLET | Freq: Every day | ORAL | Status: DC
  Administered 2012-12-03 – 2012-12-05 (×2): 7.5 mg via ORAL
  Filled 2012-12-02 (×4): qty 1

## 2012-12-02 MED ORDER — HYDRALAZINE HCL 20 MG/ML IJ SOLN: 10.0000 mg | INTRAMUSCULAR | Status: DC | PRN

## 2012-12-02 MED ORDER — LINAGLIPTIN 5 MG PO TABS
5.0000 mg | ORAL_TABLET | Freq: Every day | ORAL | Status: DC
  Administered 2012-12-03: 5 mg via ORAL
  Filled 2012-12-02 (×3): qty 1

## 2012-12-02 MED ORDER — GUAIFENESIN ER 600 MG PO TB12
1200.0000 mg | ORAL_TABLET | Freq: Two times a day (BID) | ORAL | Status: DC | PRN
  Filled 2012-12-02: qty 2

## 2012-12-02 MED ORDER — ASPIRIN EC 81 MG PO TBEC
81.0000 mg | DELAYED_RELEASE_TABLET | Freq: Every day | ORAL | Status: DC
  Administered 2012-12-03: 81 mg via ORAL
  Filled 2012-12-02 (×3): qty 1

## 2012-12-02 MED ORDER — REGADENOSON 0.4 MG/5ML IV SOLN
0.4000 mg | Freq: Once | INTRAVENOUS | Status: AC
  Administered 2012-12-03: 0.4 mg via INTRAVENOUS
  Filled 2012-12-02: qty 5

## 2012-12-02 MED ORDER — ALUM & MAG HYDROXIDE-SIMETH 200-200-20 MG/5ML PO SUSP: 15.0000 mL | ORAL | Status: DC | PRN

## 2012-12-02 MED ORDER — ALPRAZOLAM 0.5 MG PO TABS: 0.5000 mg | ORAL_TABLET | Freq: Two times a day (BID) | ORAL | Status: DC | PRN

## 2012-12-02 MED ORDER — FUROSEMIDE 40 MG PO TABS
80.0000 mg | ORAL_TABLET | Freq: Every day | ORAL | Status: DC
  Administered 2012-12-03 – 2012-12-05 (×2): 80 mg via ORAL
  Filled 2012-12-02 (×4): qty 2

## 2012-12-02 MED ORDER — METFORMIN HCL 500 MG PO TABS
1000.0000 mg | ORAL_TABLET | Freq: Two times a day (BID) | ORAL | Status: DC
  Administered 2012-12-03 (×2): 1000 mg via ORAL
  Filled 2012-12-02 (×5): qty 2

## 2012-12-02 MED ORDER — ACETAMINOPHEN 325 MG PO TABS: 325.0000 mg | ORAL_TABLET | ORAL | Status: DC | PRN

## 2012-12-02 NOTE — Progress Notes (Signed)
Utilization Review Completed.Dorcas Carrow T7/12/2012

## 2012-12-02 NOTE — Progress Notes (Signed)
*  PRELIMINARY RESULTS* Echocardiogram 2D Echocardiogram has been performed.  Jillian Bright 12/02/2012, 4:42 PM

## 2012-12-02 NOTE — Care Management Note (Unsigned)
    Page 1 of 1   12/02/2012     3:52:23 PM   CARE MANAGEMENT NOTE 12/02/2012  Patient:  Jillian Bright, Jillian Bright   Account Number:  [id number]  Date Initiated:  12/02/2012  Documentation initiated by:  Demondre Aguas  Subjective/Objective Assessment:   PT ADM ON 12/02/12 FOR CARDIAC CLEARANCE PRIOR TO LT CEA SCHEDULED FOR 12/02/12.  PTA, PT INDEPENDENT, LIVES ALONE. SHE IS ON CHRONIC HOME OXYGEN, PROVIDED BY [facility].     Action/Plan:   WILL FOLLOW FOR DC NEEDS AS PT PROGRESSES.  PT STATES SHE HAS NO ONE TO ASSIST HER AT DC, AND SHE CANNOT GO STAY WITH ANYONE.  SHE SEEMS SOMEWHAT ANNOYED BY MY QUESTIONS.  WILL FOLLOW PROGRESS.   Anticipated DC Date:  12/07/2012   Anticipated DC Plan:  HOME W HOME HEALTH SERVICES      DC Planning Services  CM consult      Choice offered to / List presented to:             Status of service:  In process, will continue to follow Medicare Important Message given?   (If response is "NO", the following Medicare IM given date fields will be blank) Date Medicare IM given:   Date Additional Medicare IM given:    Discharge Disposition:    Per UR Regulation:  Reviewed for med. necessity/level of care/duration of stay  If discussed at Long Length of Stay Meetings, dates discussed:    Comments:

## 2012-12-02 NOTE — Consult Note (Signed)
CONSULT NOTE  Date: 12/02/2012               Patient Name:  Jillian Bright MRN: [id number]  DOB: January 25, 1946 Age / Sex: 67 y.o., female        PCP: Nelwyn Salisbury Primary Cardiologist: Marca Ancona, MD            Referring Physician: Betti Cruz, MD              Reason for Consult: Pre-op evaluation prior to carotid surgery.           History of Present Illness: Patient is a 67 y.o. female with a PMHx of DM, COPD, OSA, obesity, hyperlipidemia, CAD , who was admitted to [facility] on 12/02/2012 for pre-op evaluation .  She has been found to have a tight left carotid stenosis.    She has a hx of CAD - s/p RCA PCI in 2002.  Her last myoview was 4/09 which revealed no ischemia or MI.  By echo in Sept. 2013, she had normal LV function - EF of 60-65%.  She has mild AS with a mean gradient of 19 mmHg.  Pulmonary artery pressures were normal by the last echo but have been elevated in the past.   She has had intermittant chest tightness / dyspnea.  This typically occurs with exertion and when she is mentally stressed.  .  Dr. Hart Rochester called Dr. Antoine Poche at the office today and the decision was made for her to be admitted to the hospital today for pre-op evaluation prior to carotid surgery.   She seems to be a little frustrated about being here today.    Medications: Outpatient medications: Prescriptions prior to admission  Medication Sig Dispense Refill  . albuterol (PROVENTIL HFA;VENTOLIN HFA) 108 (90 BASE) MCG/ACT inhaler Inhale 2 puffs into the lungs every 6 (six) hours as needed. For wheezing      . ALPRAZolam (XANAX) 0.5 MG tablet TAKE 1 TABLET BY MOUTH 3 TIMES A DAY AS NEEDED  90 tablet  5  . aspirin EC 81 MG tablet Take 1 tablet (81 mg total) by mouth daily.      . budesonide-formoterol (SYMBICORT) 160-4.5 MCG/ACT inhaler Inhale 2 puffs into the lungs 2 (two) times daily.      . furosemide (LASIX) 40 MG tablet Take 80 mg by mouth daily.       Marland Kitchen gabapentin (NEURONTIN) 300 MG capsule Take 300  mg by mouth 2 (two) times daily.      Marland Kitchen glipiZIDE (GLUCOTROL) 10 MG tablet TAKE 1 TABLET BY MOUTH 2 TIMES A DAY  60 tablet  11  . glucose blood (ACCU-CHEK AVIVA PLUS) test strip Test once per day and diagnosis code is 250.00  100 each  1  . guaiFENesin (MUCINEX) 600 MG 12 hr tablet Take 1,200 mg by mouth 2 (two) times daily as needed. For congestion      . HYDROcodone-acetaminophen (NORCO) 10-325 MG per tablet Take 1 tablet by mouth every 6 (six) hours as needed for pain.  60 tablet  5  . metFORMIN (GLUCOPHAGE) 1000 MG tablet Take 1 tablet (1,000 mg total) by mouth 2 (two) times daily with a meal.  60 tablet  11  . NEOMYCIN-POLYMYXIN-HC, OPHTH, SUSP Place 2 drops into both eyes every 4 (four) hours as needed.  10 mL  2  . omeprazole (PRILOSEC) 40 MG capsule Take 40 mg by mouth daily.      . potassium chloride SA (K-DUR,KLOR-CON) 20 MEQ  tablet Take 20 mEq by mouth 2 (two) times daily.      . rosuvastatin (CRESTOR) 40 MG tablet Take 1 tablet (40 mg total) by mouth daily.  90 tablet  3  . sitaGLIPtin (JANUVIA) 100 MG tablet Take 1 tablet (100 mg total) by mouth daily.  30 tablet  11  . solifenacin (VESICARE) 5 MG tablet Take 10 mg by mouth daily.      Marland Kitchen spironolactone (ALDACTONE) 25 MG tablet Take 1 tablet (25 mg total) by mouth daily.  30 tablet  11  . tiotropium (SPIRIVA) 18 MCG inhalation capsule Place 18 mcg into inhaler and inhale daily.        Current medications: Current Facility-Administered Medications  Medication Dose Route Frequency Provider Last Rate Last Dose  . albuterol (PROVENTIL HFA;VENTOLIN HFA) 108 (90 BASE) MCG/ACT inhaler 2 puff  2 puff Inhalation Q6H PRN Lars Mage, PA-C      . ALPRAZolam Prudy Feeler) tablet 0.5 mg  0.5 mg Oral BID PRN Lars Mage, PA-C      . aspirin EC tablet 81 mg  81 mg Oral Daily Emma M Collins, PA-C      . atorvastatin (LIPITOR) tablet 20 mg  20 mg Oral q1800 Lars Mage, PA-C      . budesonide-formoterol [facility]) 160-4.5 MCG/ACT inhaler 2 puff   2 puff Inhalation BID Lars Mage, PA-C      . darifenacin (ENABLEX) 24 hr tablet 7.5 mg  7.5 mg Oral Daily Lars Mage, PA-C      . furosemide (LASIX) tablet 80 mg  80 mg Oral Daily Lars Mage, PA-C      . gabapentin (NEURONTIN) capsule 300 mg  300 mg Oral BID Lars Mage, PA-C      . Melene Muller ON 12/03/2012] glipiZIDE (GLUCOTROL) tablet 10 mg  10 mg Oral BID AC Lars Mage, PA-C      . guaiFENesin [facility]) 12 hr tablet 1,200 mg  1,200 mg Oral BID PRN Lars Mage, PA-C      . HYDROcodone-acetaminophen (NORCO) 10-325 MG per tablet 1 tablet  1 tablet Oral Q6H PRN Lars Mage, PA-C      . insulin aspart (novoLOG) injection 0-15 Units  0-15 Units Subcutaneous TID WC Lars Mage, PA-C      . insulin aspart (novoLOG) injection 0-5 Units  0-5 Units Subcutaneous QHS Lars Mage, PA-C      . linagliptin (TRADJENTA) tablet 5 mg  5 mg Oral Daily Lars Mage, PA-C      . [START ON 12/03/2012] metFORMIN (GLUCOPHAGE) tablet 1,000 mg  1,000 mg Oral BID WC Lars Mage, PA-C      . neomycin-polymyxin b-dexamethasone (MAXITROL) ophthalmic suspension 2 drop  2 drop Both Eyes Q4H PRN Pryor Ochoa, MD      . pantoprazole (PROTONIX) EC tablet 40 mg  40 mg Oral Daily Lars Mage, PA-C      . potassium chloride SA (K-DUR,KLOR-CON) CR tablet 20 mEq  20 mEq Oral BID Lars Mage, PA-C      . spironolactone (ALDACTONE) tablet 25 mg  25 mg Oral Daily Emma M Collins, PA-C      . tiotropium [facility]) inhalation capsule 18 mcg  18 mcg Inhalation Daily Lars Mage, PA-C         Allergies  Allergen Reactions  . Codeine     Takes vicodin at home     Past Medical History  Diagnosis  Date  . Diabetes mellitus   . COPD (chronic obstructive pulmonary disease)     sees Dr. Coralyn Helling  . Sleep apnea, obstructive   . Pulmonary HTN   . Obesity (BMI 30.0-34.9)   . Hyperlipidemia   . Myocardial infarction     Past Surgical History  Procedure Laterality Date  . Appendectomy    .  Tonsillectomy and adenoidectomy    . Lumbar disc surgery    . Hemorrhoid surgery      Family History  Problem Relation Age of Onset  . Hypertension Mother   . Hyperlipidemia Mother   . Stroke Mother   . Heart attack Mother   . Cancer Mother   . Heart attack Father   . Cancer Brother   . Asthma Maternal Grandfather     Social History:  reports that she quit smoking about 3 years ago. Her smoking use included Cigarettes. She has a 72 pack-year smoking history. She has never used smokeless tobacco. She reports that  drinks alcohol. She reports that she does not use illicit drugs.   Review of Systems: Constitutional:  denies fever, chills, diaphoresis, appetite change and fatigue.  HEENT: denies photophobia, eye pain, redness, hearing loss, ear pain, congestion, sore throat, rhinorrhea, sneezing, neck pain, neck stiffness and tinnitus.  Respiratory: admits to SOB, DOE   Cardiovascular: admits to chest tightness, responds to 1 NTG.  Gastrointestinal: denies nausea, vomiting, abdominal pain, diarrhea, constipation, blood in stool.  Genitourinary: denies dysuria, urgency, frequency, hematuria, flank pain and difficulty urinating.  Musculoskeletal: denies  myalgias, back pain, joint swelling, arthralgias and gait problem.   Skin: denies pallor, rash and wound.  Neurological: denies dizziness, seizures, syncope, weakness, light-headedness, numbness and headaches.   Hematological: denies adenopathy, easy bruising, personal or family bleeding history.  Psychiatric/ Behavioral: denies suicidal ideation, mood changes, confusion, nervousness, sleep disturbance and agitation.    Physical Exam: BP 106/71  Pulse 108  Temp(Src) 98.7 F (37.1 C) (Oral)  Resp 18  Ht 5\' 6"  (1.676 m)  Wt 205 lb 14.6 oz (93.4 kg)  BMI 33.25 kg/m2  SpO2 86%  General: Vital signs reviewed and noted. Well-developed, well-nourished, in no acute distress; alert, appropriate and cooperative throughout examination.    Head: Normocephalic, atraumatic, sclera anicteric, mucus membranes are moist  Neck: Supple. Left carotid bruit,    JVD not elevated.  Lungs:  Clear bilaterally to auscultation without wheezes, rales, or rhonchi. Breathing is unlabored.  Heart: RRR with S1 S2. 2/6  Systolic murmur  Abdomen:  Soft, non-tender, non-distended with normoactive bowel sounds. No hepatomegaly. No rebound/guarding. No obvious abdominal masses  MSK: Strength and the appear normal for age.  Extremities: No clubbing or cyanosis. No edema.  Distal pedal pulses are 1+ and equal bilaterally.  Neurologic: Alert and oriented X 3. Moves all extremities spontaneously.  Psych: Responds to questions appropriately with a normal affect.    Lab results: Basic Metabolic Panel: No results found for this basename: NA, K, CL, CO2, GLUCOSE, BUN, CREATININE, CALCIUM, MG, PHOS,  in the last 168 hours  Liver Function Tests: No results found for this basename: AST, ALT, ALKPHOS, BILITOT, PROT, ALBUMIN,  in the last 168 hours No results found for this basename: LIPASE, AMYLASE,  in the last 168 hours No results found for this basename: AMMONIA,  in the last 168 hours  CBC: No results found for this basename: WBC, NEUTROABS, HGB, HCT, MCV, PLT,  in the last 168 hours  Cardiac Enzymes: No results found  for this basename: CKTOTAL, CKMB, CKMBINDEX, TROPONINI,  in the last 168 hours  BNP: No components found with this basename: POCBNP,   CBG: No results found for this basename: GLUCAP,  in the last 168 hours  Coagulation Studies: No results found for this basename: LABPROT, INR,  in the last 72 hours   Other results:  EKG : pending.  Tele : NSR   Imaging:  No results found.  Last Echo:   02/02/12 Study Conclusions  - Left ventricle: The cavity size was normal. Wall thickness was normal. Systolic function was normal. The estimated ejection fraction was in the range of 60% to 65%. Wall motion was normal; there were no  regional wall motion abnormalities. Doppler parameters are consistent with abnormal left ventricular relaxation (grade 1 diastolic dysfunction). - Aortic valve: AV is thickened and calcified. Difficult to see well. Peak and mean gradients through the valve are 33 and 19 mm Hg respectively consistent with mild AS. - Right ventricle: The cavity size was mildly dilated. Systolic function was mildly to moderately reduced. - Right atrium: The atrium was mildly to moderately dilated. - Pulmonary arteries: PA peak pressure: 32mm Hg     Assessment & Plan: 1. CAD.  She has a hx of CAD with PCI in 2002.  Her most recent myovie was in 2009 which was normal.  She has intermittant episodes of chest tightness -  Possibly due to angina vs. COPD.  Less likely related to her mild AS.    Will get an echo today or tomorrow.  Will get a [employer].   2. Carotid artery stenosis 3. Obesity 4. COPD 5. Hyperlipidemia 6.    DVT PPX -    Alvia Grove., MD, [facility] 12/02/2012, 12:56 PM

## 2012-12-02 NOTE — Progress Notes (Signed)
Subjective:     Patient ID: Jillian Bright, female   DOB: [birth year], 67 y.o.   MRN: [id number]  HPI this 67 year old female was referred by Dr. Clent Ridges for severe left ICA stenosis. Patient had an episode of a left eye infection about 3 weeks ago and she lost vision for most of one day. She said that that was red and inflamed it was treated with antibiotics by her eye doctor. She has had no history of previous stroke. Her vision has been unremarkable since that time. She denies any episodes of lateralizing weakness, aphasia, or syncope. Carotid duplex revealed a 99% left ICA stenosis and a 70-80% right ICA stenosis and she was referred for evaluation. She does have a history of COPD, sleep apnea, and remote coronary artery disease in 2002 with stenting performed at that time. She is admitted for preoperative clearance by Dr. Antoine Poche  Past Medical History  Diagnosis Date  . Diabetes mellitus   . COPD (chronic obstructive pulmonary disease)     sees Dr. Coralyn Helling  . Sleep apnea, obstructive   . Pulmonary HTN   . Obesity (BMI 30.0-34.9)   . Hyperlipidemia   . Myocardial infarction     History  Substance Use Topics  . Smoking status: Former Smoker -- 2.00 packs/day for 36 years    Types: Cigarettes    Quit date: 05/28/2009  . Smokeless tobacco: Never Used     Comment: had restarted and quit again 01/27/12  . Alcohol Use: Yes     Comment: few times a year    Family History  Problem Relation Age of Onset  . Hypertension Mother   . Hyperlipidemia Mother   . Stroke Mother   . Heart attack Mother   . Cancer Mother   . Heart attack Father   . Cancer Brother   . Asthma Maternal Grandfather     Allergies  Allergen Reactions  . Codeine     Current outpatient prescriptions:albuterol (PROVENTIL HFA;VENTOLIN HFA) 108 (90 BASE) MCG/ACT inhaler, Inhale 2 puffs into the lungs every 6 (six) hours as needed. For wheezing, Disp: , Rfl: ;  ALPRAZolam (XANAX) 0.5 MG tablet, TAKE 1 TABLET BY MOUTH  3 TIMES A DAY AS NEEDED, Disp: 90 tablet, Rfl: 5;  aspirin EC 81 MG tablet, Take 1 tablet (81 mg total) by mouth daily., Disp: , Rfl:  budesonide-formoterol (SYMBICORT) 160-4.5 MCG/ACT inhaler, Inhale 2 puffs into the lungs 2 (two) times daily., Disp: , Rfl: ;  furosemide (LASIX) 40 MG tablet, Take 80 mg by mouth daily. , Disp: , Rfl: ;  gabapentin (NEURONTIN) 300 MG capsule, Take 300 mg by mouth 2 (two) times daily., Disp: , Rfl: ;  glipiZIDE (GLUCOTROL) 10 MG tablet, TAKE 1 TABLET BY MOUTH 2 TIMES A DAY, Disp: 60 tablet, Rfl: 11 glucose blood (ACCU-CHEK AVIVA PLUS) test strip, Test once per day and diagnosis code is 250.00, Disp: 100 each, Rfl: 1;  HYDROcodone-acetaminophen (NORCO) 10-325 MG per tablet, Take 1 tablet by mouth every 6 (six) hours as needed for pain., Disp: 60 tablet, Rfl: 5;  metFORMIN (GLUCOPHAGE) 1000 MG tablet, Take 1 tablet (1,000 mg total) by mouth 2 (two) times daily with a meal., Disp: 60 tablet, Rfl: 11 omeprazole (PRILOSEC) 40 MG capsule, Take 40 mg by mouth daily., Disp: , Rfl: ;  potassium chloride SA (K-DUR,KLOR-CON) 20 MEQ tablet, Take 20 mEq by mouth 2 (two) times daily., Disp: , Rfl: ;  rosuvastatin (CRESTOR) 40 MG tablet, Take 1 tablet (40 mg  total) by mouth daily., Disp: 90 tablet, Rfl: 3;  sitaGLIPtin (JANUVIA) 100 MG tablet, Take 1 tablet (100 mg total) by mouth daily., Disp: 30 tablet, Rfl: 11 solifenacin (VESICARE) 5 MG tablet, Take 10 mg by mouth daily., Disp: , Rfl: ;  spironolactone (ALDACTONE) 25 MG tablet, Take 1 tablet (25 mg total) by mouth daily., Disp: 30 tablet, Rfl: 11;  tiotropium (SPIRIVA) 18 MCG inhalation capsule, Place 18 mcg into inhaler and inhale daily., Disp: , Rfl: ;  guaiFENesin (MUCINEX) 600 MG 12 hr tablet, Take 1,200 mg by mouth 2 (two) times daily as needed. For congestion, Disp: , Rfl:  NEOMYCIN-POLYMYXIN-HC, OPHTH, SUSP, Place 2 drops into both eyes every 4 (four) hours as needed., Disp: 10 mL, Rfl: 2  BP 101/76  Pulse 99  Ht 5\' 6"  (1.676 m)   Wt 217 lb (98.431 kg)  BMI 35.04 kg/m2  SpO2 90%  Body mass index is 35.04 kg/(m^2).         Review of Systems complains of some chronic chest tightness but this is unchanged over the past several months. Does have chronic dyspnea on exertion and history of sleep apnea. Denies PND orthopnea. Has diabetes mellitus controlled by insulin. All other systems negative and complete review of systems    Objective:   Physical Exam blood pressure 100/76 heart rate 99 respirations 16 Gen.-alert and oriented x3 in no apparent distress-obese  HEENT normal for age Lungs no rhonchi or wheezing Cardiovascular regular rhythm no murmurs carotid pulses 3+ palpable bilateral soft carotid bruits Abdomen soft nontender no palpable masses Musculoskeletal free of  major deformities Skin clear -no rashes Neurologic normal Lower extremities 3+ femoral and dorsalis pedis pulses palpable bilaterally with no edema  Today I reviewed a carotid duplex exam which reveals an extremely tight left ICA stenosis-string sign and a moderate right ICA stenosis.       Assessment:     99% left ICA stenosis with recent episode of visual loss-likely due to infection Remote history coronary artery disease COPD with history of sleep apnea    Plan:     We'll admit patient today to be evaluated by cardiology for urgent left carotid endarterectomy to be performed on Thursday, July 10  Dr. Angelina Sheriff will evaluate patient for clearance

## 2012-12-03 ENCOUNTER — Other Ambulatory Visit: Payer: Self-pay

## 2012-12-03 ENCOUNTER — Inpatient Hospital Stay (HOSPITAL_COMMUNITY): Payer: Medicare Other

## 2012-12-03 DIAGNOSIS — I6529 Occlusion and stenosis of unspecified carotid artery: Secondary | ICD-10-CM

## 2012-12-03 LAB — COMPREHENSIVE METABOLIC PANEL
ALT: 15 U/L (ref 0–35)
AST: 26 U/L (ref 0–37)
Albumin: 3.5 g/dL (ref 3.5–5.2)
Alkaline Phosphatase: 67 U/L (ref 39–117)
Potassium: 4.4 mEq/L (ref 3.5–5.1)
Sodium: 137 mEq/L (ref 135–145)
Total Protein: 7.5 g/dL (ref 6.0–8.3)

## 2012-12-03 LAB — GLUCOSE, CAPILLARY
Glucose-Capillary: 211 mg/dL — ABNORMAL HIGH (ref 70–99)
Glucose-Capillary: 131 mg/dL — ABNORMAL HIGH (ref 70–99)
Glucose-Capillary: 171 mg/dL — ABNORMAL HIGH (ref 70–99)
Glucose-Capillary: 202 mg/dL — ABNORMAL HIGH (ref 70–99)

## 2012-12-03 LAB — CBC
Hemoglobin: 13.2 g/dL (ref 12.0–15.0)
MCHC: 30.9 g/dL (ref 30.0–36.0)
RDW: 16.9 % — ABNORMAL HIGH (ref 11.5–15.5)

## 2012-12-03 LAB — URINALYSIS, ROUTINE W REFLEX MICROSCOPIC
Glucose, UA: NEGATIVE mg/dL
Hgb urine dipstick: NEGATIVE
Specific Gravity, Urine: 1.015 (ref 1.005–1.030)
Urobilinogen, UA: 0.2 mg/dL (ref 0.0–1.0)

## 2012-12-03 MED ORDER — DEXTROSE 5 % IV SOLN
1.5000 g | Freq: Two times a day (BID) | INTRAVENOUS | Status: DC
  Administered 2012-12-03 (×2): 1.5 g via INTRAVENOUS
  Filled 2012-12-03 (×4): qty 1.5

## 2012-12-03 MED ORDER — SENNA 8.6 MG PO TABS
1.0000 | ORAL_TABLET | Freq: Two times a day (BID) | ORAL | Status: DC
  Administered 2012-12-03 (×2): 8.6 mg via ORAL
  Filled 2012-12-03 (×4): qty 1

## 2012-12-03 MED ORDER — ALUM & MAG HYDROXIDE-SIMETH 200-200-20 MG/5ML PO SUSP: 15.0000 mL | ORAL | Status: DC | PRN

## 2012-12-03 MED ORDER — PHENOL 1.4 % MT LIQD
1.0000 | OROMUCOSAL | Status: DC | PRN
  Filled 2012-12-03: qty 177

## 2012-12-03 MED ORDER — HYDRALAZINE HCL 20 MG/ML IJ SOLN: 10.0000 mg | INTRAMUSCULAR | Status: DC | PRN

## 2012-12-03 MED ORDER — BISACODYL 10 MG RE SUPP: 10.0000 mg | Freq: Every day | RECTAL | Status: DC | PRN

## 2012-12-03 MED ORDER — ONDANSETRON HCL 4 MG/2ML IJ SOLN: 4.0000 mg | Freq: Four times a day (QID) | INTRAMUSCULAR | Status: DC | PRN

## 2012-12-03 MED ORDER — MORPHINE SULFATE 4 MG/ML IJ SOLN
INTRAMUSCULAR | Status: AC
  Administered 2012-12-03: 2 mg
  Filled 2012-12-03: qty 1

## 2012-12-03 MED ORDER — LABETALOL HCL 5 MG/ML IV SOLN
10.0000 mg | INTRAVENOUS | Status: DC | PRN
  Filled 2012-12-03: qty 4

## 2012-12-03 MED ORDER — TRAMADOL HCL 50 MG PO TABS: 50.0000 mg | ORAL_TABLET | Freq: Four times a day (QID) | ORAL | Status: DC | PRN

## 2012-12-03 MED ORDER — TECHNETIUM TC 99M SESTAMIBI GENERIC - CARDIOLITE
30.0000 | Freq: Once | INTRAVENOUS | Status: AC | PRN
  Administered 2012-12-03: 30 via INTRAVENOUS

## 2012-12-03 MED ORDER — SODIUM CHLORIDE 0.9 % IJ SOLN
3.0000 mL | Freq: Two times a day (BID) | INTRAMUSCULAR | Status: DC
  Administered 2012-12-03 (×2): 3 mL via INTRAVENOUS

## 2012-12-03 MED ORDER — METOPROLOL TARTRATE 1 MG/ML IV SOLN: 2.0000 mg | INTRAVENOUS | Status: DC | PRN

## 2012-12-03 MED ORDER — TECHNETIUM TC 99M SESTAMIBI GENERIC - CARDIOLITE
10.0000 | Freq: Once | INTRAVENOUS | Status: AC | PRN
  Administered 2012-12-03: 10 via INTRAVENOUS

## 2012-12-03 MED ORDER — MORPHINE SULFATE 2 MG/ML IJ SOLN: 2.0000 mg | INTRAMUSCULAR | Status: DC | PRN

## 2012-12-03 MED ORDER — HYDROCODONE-ACETAMINOPHEN 10-325 MG PO TABS
1.0000 | ORAL_TABLET | Freq: Four times a day (QID) | ORAL | Status: DC | PRN
  Administered 2012-12-03 – 2012-12-05 (×5): 1 via ORAL
  Filled 2012-12-03 (×5): qty 1

## 2012-12-03 MED ORDER — SODIUM CHLORIDE 0.9 % IV SOLN: 250.0000 mL | INTRAVENOUS | Status: DC | PRN

## 2012-12-03 MED ORDER — SENNOSIDES-DOCUSATE SODIUM 8.6-50 MG PO TABS
1.0000 | ORAL_TABLET | Freq: Every evening | ORAL | Status: DC | PRN
  Filled 2012-12-03: qty 1

## 2012-12-03 MED ORDER — ENOXAPARIN SODIUM 40 MG/0.4ML ~~LOC~~ SOLN
40.0000 mg | SUBCUTANEOUS | Status: DC
  Administered 2012-12-03: 40 mg via SUBCUTANEOUS
  Filled 2012-12-03 (×2): qty 0.4

## 2012-12-03 MED ORDER — SODIUM CHLORIDE 0.9 % IJ SOLN
3.0000 mL | INTRAMUSCULAR | Status: DC | PRN
  Administered 2012-12-03: 3 mL via INTRAVENOUS

## 2012-12-03 MED ORDER — DOCUSATE SODIUM 100 MG PO CAPS
100.0000 mg | ORAL_CAPSULE | Freq: Two times a day (BID) | ORAL | Status: DC
  Filled 2012-12-03 (×2): qty 1

## 2012-12-03 NOTE — Progress Notes (Addendum)
Patient Name: Jillian Bright Date of Encounter: 12/03/2012   Principal Problem:   Occlusion and stenosis of carotid artery without mention of cerebral infarction Active Problems:   DIABETES MELLITUS, TYPE II   Generalized anxiety disorder   HYPERTENSION   PULMONARY HYPERTENSION   COPD with emphysema   GERD   Obesity hypoventilation syndrome   CAD (coronary artery disease)   Aortic stenosis   SUBJECTIVE  No chest pain or sob.  CURRENT MEDS . aspirin EC  81 mg Oral Daily  . atorvastatin  20 mg Oral q1800  . budesonide-formoterol  2 puff Inhalation BID  . cefUROXime (ZINACEF)  IV  1.5 g Intravenous Q12H  . darifenacin  7.5 mg Oral Daily  . docusate sodium  100 mg Oral BID  . enoxaparin (LOVENOX) injection  30 mg Subcutaneous Q24H  . furosemide  80 mg Oral Daily  . gabapentin  300 mg Oral BID  . glipiZIDE  10 mg Oral BID AC  . insulin aspart  0-15 Units Subcutaneous TID WC  . insulin aspart  0-5 Units Subcutaneous QHS  . linagliptin  5 mg Oral Daily  . metFORMIN  1,000 mg Oral BID WC  . pantoprazole  40 mg Oral Daily  . potassium chloride SA  20 mEq Oral BID  . senna  1 tablet Oral BID  . sodium chloride  3 mL Intravenous Q12H  . spironolactone  25 mg Oral Daily  . tiotropium  18 mcg Inhalation Daily   OBJECTIVE  Filed Vitals:   12/02/12 2100 12/03/12 0448 12/03/12 0824 12/03/12 0843  BP:  119/69 112/53 113/59  Pulse:  86 80 102  Temp:  97.5 F (36.4 C)    TempSrc:  Oral    Resp:  16    Height:      Weight:      SpO2: 93% 92%      Intake/Output Summary (Last 24 hours) at 12/03/12 0845 Last data filed at 12/03/12 0506  Gross per 24 hour  Intake    600 ml  Output    400 ml  Net    200 ml   Filed Weights   12/02/12 1200  Weight: 205 lb 14.6 oz (93.4 kg)    PHYSICAL EXAM  General: Pleasant, NAD. Neuro: Alert and oriented X 3. Moves all extremities spontaneously. Psych: Normal affect. HEENT:  Normal  Neck: Supple.  Obese, difficult to assess jvp.   bilat radiated murmur vs bruits. Lungs:  Resp regular and unlabored, bibasilar crackles, diminished breath sounds. Heart: RRR no s3, s4, 2/6 sem loudest @ rusb. Abdomen: Soft, non-tender, non-distended, BS + x 4.  Extremities: No clubbing, cyanosis or edema. DP/PT/Radials 2+ and equal bilaterally.  Accessory Clinical Findings  Hemoglobin A1C  Recent Labs  12/02/12 1500  HGBA1C 8.3*   TELE  Seen in nuc med - sinus on monitor here.  2D Echocardiogram  Study Conclusions  - Left ventricle: The cavity size was normal. Systolic function was normal. The estimated ejection fraction was in the range of 55% to 60%. Wall motion was normal; there were no regional wall motion abnormalities. Doppler parameters are consistent with abnormal left ventricular relaxation (grade 1 diastolic dysfunction). - Aortic valve: There was mild to moderate stenosis. - Mitral valve: Calcified annulus. _____________  Radiology/Studies  No results found.  ASSESSMENT AND PLAN  1.  USA/CAD:  Pt presented yesterday for preop evaluation with pending l cea.  For MV today.  No chest pain overnight.  Cont asa/statin.  2.  Chronic diast CHF: Euvolemic  HR/BP stable. Cont current meds.  3.  Carotid dzs:  Pending L CEA.  On asa/statin.  4.  AS:  Mild to mod by echo.  Signed, Nicolasa Ducking NP  Patient seen. Agree with above assessment. Awaiting results of Myoview done earlier today. No new cardiac symptoms. Lungs are clear. Rhythm is regular. Heart reveals grade 2/6 systolic ejection murmur at aortic area. Plan as above.  Wylene Simmer, MD   ADDENDUMEugenie Birks Myoview shows:   IMPRESSION: Normal myocardial perfusion examination. No evidence for pharmacologically induced ischemia.  Calculated ejection fraction is 69%.  Thus, low-risk for perioperative cardiac complication.  Cont statin throughout perioperative period and resume ASA when OK with vascular surgery.  Nicolasa Ducking 4:59  PM

## 2012-12-03 NOTE — Progress Notes (Signed)
Patient ID: Jillian Bright, female   DOB: 02-24-1946, 67 y.o.   MRN: [id number] Vascular Surgery Progress Note  Subjective: Severe left ICA stenosis.-Asymptomatic. History of PTCA and stenting 2002. Patient has mild chest discomfort. Echocardiogram revealed EF of 55-60% 4 nuclear stress test today. Plan left carotid endarterectomy in a.m. if cleared by cardiology  Objective:  Filed Vitals:   12/03/12 0849  BP: 107/55  Pulse: 96  Temp:   Resp:     General alert oriented x3 in no apparent stress  Neurologic exam normal   Labs: No results found for this basename: CREATININE,  in the last 168 hours No results found for this basename: NA, K, CL, CO2, BUN, CREATININE, LABGLOM, GLUCOSE, CALCIUM,  in the last 168 hours  Recent Labs Lab 12/03/12 1050  WBC 6.5  HGB 13.2  HCT 42.7  PLT 147*    Recent Labs Lab 12/03/12 1050  INR 1.03    I/O last 3 completed shifts: In: 600 [P.O.:600] Out: 400 [Urine:400]  Imaging: No results found.  Assessment/Plan:   LOS: 1 day  s/p   Plan left carotid endarterectomy in a.m. if cleared by cardiology Risks of perioperative stroke-1% and other potential outcomes discussed with patient she would like to proceed   [name], MD 12/03/2012 12:17 PM

## 2012-12-04 ENCOUNTER — Telehealth: Payer: Self-pay | Admitting: Vascular Surgery

## 2012-12-04 ENCOUNTER — Encounter (HOSPITAL_COMMUNITY)
Admission: AD | Disposition: A | Discharge status: Home / Self Care | Payer: Self-pay | Source: Ambulatory Visit | Attending: Vascular Surgery

## 2012-12-04 ENCOUNTER — Inpatient Hospital Stay (HOSPITAL_COMMUNITY): Payer: Medicare Other | Admitting: Anesthesiology

## 2012-12-04 ENCOUNTER — Encounter (HOSPITAL_COMMUNITY): Payer: Self-pay | Admitting: Anesthesiology

## 2012-12-04 ENCOUNTER — Inpatient Hospital Stay (HOSPITAL_COMMUNITY): Admission: RE | Admit: 2012-12-04 | Payer: Medicare Other | Source: Ambulatory Visit | Admitting: Vascular Surgery

## 2012-12-04 HISTORY — PX: PATCH ANGIOPLASTY: SHX6230

## 2012-12-04 HISTORY — PX: ENDARTERECTOMY: SHX5162

## 2012-12-04 LAB — GLUCOSE, CAPILLARY
Glucose-Capillary: 174 mg/dL — ABNORMAL HIGH (ref 70–99)
Glucose-Capillary: 133 mg/dL — ABNORMAL HIGH (ref 70–99)

## 2012-12-04 LAB — CBC
Hemoglobin: 11 g/dL — ABNORMAL LOW (ref 12.0–15.0)
MCH: 25.6 pg — ABNORMAL LOW (ref 26.0–34.0)
MCV: 83.7 fL (ref 78.0–100.0)
Platelets: 132 10*3/uL — ABNORMAL LOW (ref 150–400)
RBC: 4.3 MIL/uL (ref 3.87–5.11)

## 2012-12-04 LAB — SURGICAL PCR SCREEN: Staphylococcus aureus: NEGATIVE

## 2012-12-04 SURGERY — ENDARTERECTOMY, CAROTID
Anesthesia: General | Site: Neck | Laterality: Left | Wound class: Clean

## 2012-12-04 MED ORDER — DOPAMINE-DEXTROSE 3.2-5 MG/ML-% IV SOLN
3.0000 ug/kg/min | INTRAVENOUS | Status: DC
  Administered 2012-12-04: 3 ug/kg/min via INTRAVENOUS

## 2012-12-04 MED ORDER — LACTATED RINGERS IV SOLN
INTRAVENOUS | Status: DC | PRN
  Administered 2012-12-04: 07:00:00 via INTRAVENOUS

## 2012-12-04 MED ORDER — HYDROCODONE-ACETAMINOPHEN 10-325 MG PO TABS: 1.0000 | ORAL_TABLET | Freq: Four times a day (QID) | ORAL | Status: DC | PRN

## 2012-12-04 MED ORDER — LIDOCAINE HCL (PF) 1 % IJ SOLN
INTRAMUSCULAR | Status: AC
  Filled 2012-12-04: qty 30

## 2012-12-04 MED ORDER — GUAIFENESIN-DM 100-10 MG/5ML PO SYRP: 15.0000 mL | ORAL_SOLUTION | ORAL | Status: DC | PRN

## 2012-12-04 MED ORDER — PROTAMINE SULFATE 10 MG/ML IV SOLN
INTRAVENOUS | Status: DC | PRN
  Administered 2012-12-04: 50 mg via INTRAVENOUS

## 2012-12-04 MED ORDER — NEOSTIGMINE METHYLSULFATE 1 MG/ML IJ SOLN
INTRAMUSCULAR | Status: DC | PRN
  Administered 2012-12-04: 4 mg via INTRAVENOUS

## 2012-12-04 MED ORDER — GLYCOPYRROLATE 0.2 MG/ML IJ SOLN
INTRAMUSCULAR | Status: DC | PRN
  Administered 2012-12-04: 0.6 mg via INTRAVENOUS

## 2012-12-04 MED ORDER — HYDROMORPHONE HCL PF 1 MG/ML IJ SOLN
0.2500 mg | INTRAMUSCULAR | Status: DC | PRN
  Administered 2012-12-04: 0.25 mg via INTRAVENOUS

## 2012-12-04 MED ORDER — MIDAZOLAM HCL 5 MG/5ML IJ SOLN
INTRAMUSCULAR | Status: DC | PRN
  Administered 2012-12-04 (×2): 1 mg via INTRAVENOUS

## 2012-12-04 MED ORDER — PROPOFOL 10 MG/ML IV BOLUS
INTRAVENOUS | Status: DC | PRN
  Administered 2012-12-04: 200 mg via INTRAVENOUS

## 2012-12-04 MED ORDER — ARTIFICIAL TEARS OP OINT
TOPICAL_OINTMENT | OPHTHALMIC | Status: DC | PRN
  Administered 2012-12-04: 1 via OPHTHALMIC

## 2012-12-04 MED ORDER — ALBUMIN HUMAN 5 % IV SOLN
INTRAVENOUS | Status: AC
  Filled 2012-12-04: qty 250

## 2012-12-04 MED ORDER — DEXTROSE 5 % IV SOLN
1.5000 g | Freq: Two times a day (BID) | INTRAVENOUS | Status: AC
  Administered 2012-12-04 – 2012-12-05 (×2): 1.5 g via INTRAVENOUS
  Filled 2012-12-04 (×2): qty 1.5

## 2012-12-04 MED ORDER — METFORMIN HCL 500 MG PO TABS
1000.0000 mg | ORAL_TABLET | Freq: Two times a day (BID) | ORAL | Status: DC
  Administered 2012-12-04 – 2012-12-05 (×2): 1000 mg via ORAL
  Filled 2012-12-04 (×4): qty 2

## 2012-12-04 MED ORDER — DOPAMINE-DEXTROSE 3.2-5 MG/ML-% IV SOLN
2.0000 ug/kg/min | INTRAVENOUS | Status: DC
  Administered 2012-12-04: 3 ug/kg/min via INTRAVENOUS

## 2012-12-04 MED ORDER — METOPROLOL TARTRATE 1 MG/ML IV SOLN: 2.0000 mg | INTRAVENOUS | Status: DC | PRN

## 2012-12-04 MED ORDER — ALPRAZOLAM 0.5 MG PO TABS
0.5000 mg | ORAL_TABLET | Freq: Three times a day (TID) | ORAL | Status: DC | PRN
Start: 2012-12-04 — End: 2012-12-05
  Administered 2012-12-04: 0.5 mg via ORAL
  Filled 2012-12-04: qty 1

## 2012-12-04 MED ORDER — ONDANSETRON HCL 4 MG/2ML IJ SOLN: 4.0000 mg | Freq: Four times a day (QID) | INTRAMUSCULAR | Status: DC | PRN

## 2012-12-04 MED ORDER — ACETAMINOPHEN 10 MG/ML IV SOLN
INTRAVENOUS | Status: AC
  Filled 2012-12-04: qty 100

## 2012-12-04 MED ORDER — ASPIRIN EC 325 MG PO TBEC
325.0000 mg | DELAYED_RELEASE_TABLET | Freq: Every day | ORAL | Status: DC
  Administered 2012-12-04 – 2012-12-05 (×2): 325 mg via ORAL
  Filled 2012-12-04 (×2): qty 1

## 2012-12-04 MED ORDER — 0.9 % SODIUM CHLORIDE (POUR BTL) OPTIME
TOPICAL | Status: DC | PRN
  Administered 2012-12-04: 2000 mL

## 2012-12-04 MED ORDER — MORPHINE SULFATE 2 MG/ML IJ SOLN
2.0000 mg | INTRAMUSCULAR | Status: DC | PRN
  Administered 2012-12-04 (×2): 2 mg via INTRAVENOUS
  Filled 2012-12-04 (×2): qty 1

## 2012-12-04 MED ORDER — ALBUTEROL SULFATE HFA 108 (90 BASE) MCG/ACT IN AERS: 2.0000 | INHALATION_SPRAY | Freq: Four times a day (QID) | RESPIRATORY_TRACT | Status: DC | PRN

## 2012-12-04 MED ORDER — SODIUM CHLORIDE 0.9 % IR SOLN
Status: DC | PRN
  Administered 2012-12-04: 09:00:00

## 2012-12-04 MED ORDER — PHENYLEPHRINE HCL 10 MG/ML IJ SOLN
20.0000 mg | INTRAVENOUS | Status: DC | PRN
  Administered 2012-12-04: 50 ug/min via INTRAVENOUS

## 2012-12-04 MED ORDER — LIDOCAINE HCL (CARDIAC) 20 MG/ML IV SOLN
INTRAVENOUS | Status: DC | PRN
  Administered 2012-12-04: 80 mg via INTRAVENOUS

## 2012-12-04 MED ORDER — SODIUM CHLORIDE 0.9 % IV SOLN: 250.0000 mL | INTRAVENOUS | Status: DC | PRN

## 2012-12-04 MED ORDER — ONDANSETRON HCL 4 MG/2ML IJ SOLN: 4.0000 mg | Freq: Once | INTRAMUSCULAR | Status: DC | PRN

## 2012-12-04 MED ORDER — HYDROMORPHONE HCL PF 1 MG/ML IJ SOLN
INTRAMUSCULAR | Status: AC
  Filled 2012-12-04: qty 1

## 2012-12-04 MED ORDER — CEFAZOLIN SODIUM-DEXTROSE 2-3 GM-% IV SOLR
INTRAVENOUS | Status: DC | PRN
  Administered 2012-12-04: 3 g via INTRAVENOUS

## 2012-12-04 MED ORDER — LINAGLIPTIN 5 MG PO TABS
5.0000 mg | ORAL_TABLET | Freq: Every day | ORAL | Status: DC
  Administered 2012-12-05: 5 mg via ORAL
  Filled 2012-12-04: qty 1

## 2012-12-04 MED ORDER — SODIUM CHLORIDE 0.9 % IV SOLN
500.0000 mL | Freq: Once | INTRAVENOUS | Status: AC | PRN
  Administered 2012-12-04 (×2): 500 mL via INTRAVENOUS

## 2012-12-04 MED ORDER — ACETAMINOPHEN 10 MG/ML IV SOLN
1000.0000 mg | Freq: Once | INTRAVENOUS | Status: AC | PRN
  Administered 2012-12-04: 1000 mg via INTRAVENOUS

## 2012-12-04 MED ORDER — POTASSIUM CHLORIDE CRYS ER 20 MEQ PO TBCR: 20.0000 meq | EXTENDED_RELEASE_TABLET | Freq: Once | ORAL | Status: AC | PRN

## 2012-12-04 MED ORDER — FENTANYL CITRATE 0.05 MG/ML IJ SOLN
INTRAMUSCULAR | Status: DC | PRN
  Administered 2012-12-04: 25 ug via INTRAVENOUS
  Administered 2012-12-04: 75 ug via INTRAVENOUS
  Administered 2012-12-04 (×2): 50 ug via INTRAVENOUS

## 2012-12-04 MED ORDER — POTASSIUM CHLORIDE CRYS ER 20 MEQ PO TBCR
20.0000 meq | EXTENDED_RELEASE_TABLET | Freq: Two times a day (BID) | ORAL | Status: DC
  Administered 2012-12-05: 20 meq via ORAL
  Filled 2012-12-04 (×3): qty 1

## 2012-12-04 MED ORDER — ACETAMINOPHEN 325 MG PO TABS: 325.0000 mg | ORAL_TABLET | ORAL | Status: DC | PRN

## 2012-12-04 MED ORDER — DOPAMINE-DEXTROSE 3.2-5 MG/ML-% IV SOLN
INTRAVENOUS | Status: AC
  Filled 2012-12-04: qty 250

## 2012-12-04 MED ORDER — DOCUSATE SODIUM 100 MG PO CAPS
100.0000 mg | ORAL_CAPSULE | Freq: Every day | ORAL | Status: DC
  Administered 2012-12-05: 100 mg via ORAL
  Filled 2012-12-04: qty 1

## 2012-12-04 MED ORDER — LABETALOL HCL 5 MG/ML IV SOLN: 10.0000 mg | INTRAVENOUS | Status: DC | PRN

## 2012-12-04 MED ORDER — ALBUMIN HUMAN 5 % IV SOLN
12.5000 g | Freq: Once | INTRAVENOUS | Status: AC
  Administered 2012-12-04: 12.5 g via INTRAVENOUS

## 2012-12-04 MED ORDER — ROCURONIUM BROMIDE 100 MG/10ML IV SOLN
INTRAVENOUS | Status: DC | PRN
  Administered 2012-12-04: 50 mg via INTRAVENOUS

## 2012-12-04 MED ORDER — PANTOPRAZOLE SODIUM 40 MG PO TBEC
40.0000 mg | DELAYED_RELEASE_TABLET | Freq: Every day | ORAL | Status: DC
  Administered 2012-12-05: 40 mg via ORAL

## 2012-12-04 MED ORDER — LIDOCAINE HCL 4 % MT SOLN
OROMUCOSAL | Status: DC | PRN
  Administered 2012-12-04: 4 mL via TOPICAL

## 2012-12-04 MED ORDER — ENOXAPARIN SODIUM 40 MG/0.4ML ~~LOC~~ SOLN
40.0000 mg | SUBCUTANEOUS | Status: DC
  Administered 2012-12-05: 40 mg via SUBCUTANEOUS
  Filled 2012-12-04 (×2): qty 0.4

## 2012-12-04 MED ORDER — CEFAZOLIN SODIUM 1-5 GM-% IV SOLN
INTRAVENOUS | Status: AC
  Filled 2012-12-04: qty 150

## 2012-12-04 MED ORDER — HYDRALAZINE HCL 20 MG/ML IJ SOLN: 10.0000 mg | INTRAMUSCULAR | Status: DC | PRN

## 2012-12-04 MED ORDER — ACETAMINOPHEN 650 MG RE SUPP: 325.0000 mg | RECTAL | Status: DC | PRN

## 2012-12-04 MED ORDER — PHENOL 1.4 % MT LIQD: 1.0000 | OROMUCOSAL | Status: DC | PRN

## 2012-12-04 MED ORDER — ONDANSETRON HCL 4 MG/2ML IJ SOLN
INTRAMUSCULAR | Status: DC | PRN
  Administered 2012-12-04: 4 mg via INTRAVENOUS

## 2012-12-04 SURGICAL SUPPLY — 44 items
CANISTER SUCTION 2500CC (MISCELLANEOUS) ×2 IMPLANT
CATH ROBINSON RED A/P 18FR (CATHETERS) ×2 IMPLANT
CATH SUCT 10FR WHISTLE TIP (CATHETERS) ×2 IMPLANT
CLIP TI MEDIUM 24 (CLIP) ×2 IMPLANT
CLIP TI WIDE RED SMALL 24 (CLIP) ×2 IMPLANT
CLOTH BEACON ORANGE TIMEOUT ST (SAFETY) ×2 IMPLANT
COVER SURGICAL LIGHT HANDLE (MISCELLANEOUS) ×2 IMPLANT
CRADLE DONUT ADULT HEAD (MISCELLANEOUS) ×2 IMPLANT
DECANTER SPIKE VIAL GLASS SM (MISCELLANEOUS) IMPLANT
DRAIN HEMOVAC 1/8 X 5 (WOUND CARE) IMPLANT
DRAPE WARM FLUID 44X44 (DRAPE) ×2 IMPLANT
DRSG COVADERM 4X6 (GAUZE/BANDAGES/DRESSINGS) ×1 IMPLANT
ELECT REM PT RETURN 9FT ADLT (ELECTROSURGICAL) ×2
ELECTRODE REM PT RTRN 9FT ADLT (ELECTROSURGICAL) ×1 IMPLANT
EVACUATOR SILICONE 100CC (DRAIN) IMPLANT
GLOVE BIO SURGEON STRL SZ 6.5 (GLOVE) ×4 IMPLANT
GLOVE BIO SURGEON STRL SZ7 (GLOVE) ×2 IMPLANT
GLOVE BIOGEL PI IND STRL 7.0 (GLOVE) IMPLANT
GLOVE BIOGEL PI INDICATOR 7.0 (GLOVE) ×3
GLOVE ECLIPSE 6.5 STRL STRAW (GLOVE) ×1 IMPLANT
GLOVE SS BIOGEL STRL SZ 7 (GLOVE) ×1 IMPLANT
GLOVE SUPERSENSE BIOGEL SZ 7 (GLOVE) ×1
GOWN PREVENTION PLUS XLARGE (GOWN DISPOSABLE) ×1 IMPLANT
GOWN STRL NON-REIN LRG LVL3 (GOWN DISPOSABLE) ×5 IMPLANT
INSERT FOGARTY SM (MISCELLANEOUS) ×2 IMPLANT
KIT BASIN OR (CUSTOM PROCEDURE TRAY) ×2 IMPLANT
KIT ROOM TURNOVER OR (KITS) ×2 IMPLANT
NEEDLE 22X1 1/2 (OR ONLY) (NEEDLE) IMPLANT
NS IRRIG 1000ML POUR BTL (IV SOLUTION) ×4 IMPLANT
PACK CAROTID (CUSTOM PROCEDURE TRAY) ×2 IMPLANT
PAD ARMBOARD 7.5X6 YLW CONV (MISCELLANEOUS) ×4 IMPLANT
PATCH HEMASHIELD 8X75 (Vascular Products) ×1 IMPLANT
SHUNT CAROTID BYPASS 12FRX15.5 (VASCULAR PRODUCTS) IMPLANT
SUT PROLENE 6 0 CC (SUTURE) ×3 IMPLANT
SUT SILK 2 0 FS (SUTURE) ×2 IMPLANT
SUT SILK 3 0 TIES 17X18 (SUTURE)
SUT SILK 3-0 18XBRD TIE BLK (SUTURE) IMPLANT
SUT VIC AB 2-0 CT1 27 (SUTURE) ×2
SUT VIC AB 2-0 CT1 TAPERPNT 27 (SUTURE) ×1 IMPLANT
SUT VIC AB 3-0 X1 27 (SUTURE) ×2 IMPLANT
SYR CONTROL 10ML LL (SYRINGE) IMPLANT
TOWEL OR 17X24 6PK STRL BLUE (TOWEL DISPOSABLE) ×2 IMPLANT
TOWEL OR 17X26 10 PK STRL BLUE (TOWEL DISPOSABLE) ×2 IMPLANT
WATER STERILE IRR 1000ML POUR (IV SOLUTION) ×2 IMPLANT

## 2012-12-04 NOTE — Telephone Encounter (Signed)
Message copied by Margaretmary Eddy on Thu Dec 04, 2012  9:47 AM ------      Message from: Marlowe Shores      Created: Thu Dec 04, 2012  9:20 AM      Regarding: change MD       This pt is [employer]  , not [facility] so needs 2 week CEA F/U with Hart Rochester, sorry for the mix-up ------

## 2012-12-04 NOTE — Transfer of Care (Signed)
Immediate Anesthesia Transfer of Care Note  Patient: Jillian Bright  Procedure(s) Performed: Procedure(s): ENDARTERECTOMY CAROTID (Left) PATCH ANGIOPLASTY (Left)  Patient Location: PACU  Anesthesia Type:General  Level of Consciousness: awake, alert  and oriented  Airway & Oxygen Therapy: Patient Spontanous Breathing and Patient connected to nasal cannula oxygen  Post-op Assessment: Report given to PACU RN and Post -op Vital signs reviewed and stable  Post vital signs: Reviewed and stable  Complications: No apparent anesthesia complications

## 2012-12-04 NOTE — Progress Notes (Signed)
Utilization review completed.  

## 2012-12-04 NOTE — H&P (View-Only) (Signed)
Patient ID: Jillian Bright, female   DOB: 10/09/1945, 67 y.o.   MRN: [id number] Vascular Surgery Progress Note  Subjective: Severe left ICA stenosis.-Asymptomatic. History of PTCA and stenting 2002. Patient has mild chest discomfort. Echocardiogram revealed EF of 55-60% 4 nuclear stress test today. Plan left carotid endarterectomy in a.m. if cleared by cardiology  Objective:  Filed Vitals:   12/03/12 0849  BP: 107/55  Pulse: 96  Temp:   Resp:     General alert oriented x3 in no apparent stress  Neurologic exam normal   Labs: No results found for this basename: CREATININE,  in the last 168 hours No results found for this basename: NA, K, CL, CO2, BUN, CREATININE, LABGLOM, GLUCOSE, CALCIUM,  in the last 168 hours  Recent Labs Lab 12/03/12 1050  WBC 6.5  HGB 13.2  HCT 42.7  PLT 147*    Recent Labs Lab 12/03/12 1050  INR 1.03    I/O last 3 completed shifts: In: 600 [P.O.:600] Out: 400 [Urine:400]  Imaging: No results found.  Assessment/Plan:   LOS: 1 day  s/p   Plan left carotid endarterectomy in a.m. if cleared by cardiology Risks of perioperative stroke-1% and other potential outcomes discussed with patient she would like to proceed   James Lawson, MD 12/03/2012 12:17 PM           

## 2012-12-04 NOTE — Telephone Encounter (Signed)
LVM regarding patient's appointment info, sent letter - kf

## 2012-12-04 NOTE — Preoperative (Signed)
Beta Blockers   Reason not to administer Beta Blockers:Not Applicable 

## 2012-12-04 NOTE — Op Note (Signed)
OPERATIVE REPORT  Date of Surgery: 12/02/2012 - 12/04/2012  Surgeon: [name], MD  Assistant: Clearence Ped  Pre-op Diagnosis: Severe Left internal carotid Stenosis -asymptomatic Post-op Diagnosis: Same  Procedure: Procedure(s): ENDARTERECTOMY CAROTID-left with Dacron PATCH ANGIOPLASTY  Anesthesia: General  EBL: 100 cc  Complications: None  Procedure Details: The patient was taken to the operating room and placed in the supine position. Following induction of satisfactory general endotracheal anesthesia the left neck was prepped and draped in a routine sterile manner. Incision was made on the anterior border of the sternocleidomastoid muscle and carried down through the subcutaneous tissue and platysma using the Bovie. Care was taken not to injure the hypoglossal nerve.. The common internal and external carotid arteries were dissected free. There was a calcified atherosclerotic plaque at the carotid bifurcation extending up the internal carotid artery. A #10 shunt was then prepared and the patient was heparinized. The carotid vessels were occluded with vascular clamps. A longitudinal opening was made in the common carotid with a 15 blade extended up the internal carotid with the Potts scissors to a point distal to the disease. The plaque was approximately 99 % stenotic in severity. The distal vessel appeared normal. Shunt was inserted without difficulty reestablishing flow in about 2 minutes. A standard endarterectomy was performed with an eversion endarterectomy of the external carotid. The plaque feathered off  the distal internal carotid artery nicely not requiring any tacking sutures. The lumen was thoroughly irrigated with heparinized saline and loose debris all carefully removed. The arterotomy was then closed with a patch using continuous 6-0 Prolene. Prior to completion of the  Closure the  shunt was removed after approximately 30 minutes of shunt time. Flow was then reestablished  up the external branch initially followed by the internal branch. Protamine was given to her reverse the heparin.Following adequate hemostasis the wound was irrigated with saline and closed in layers with Vicryl ain a subcuticular fashion. Sterile dressing was applied and the patient taken to the recovery room in stable condition.  [name], MD 12/04/2012 9:35 AM

## 2012-12-04 NOTE — Interval H&P Note (Signed)
History and Physical Interval Note:  12/04/2012 7:26 AM  Jillian Bright  has presented today for surgery, with the diagnosis of Severe Left ICA Stenosis   The various methods of treatment have been discussed with the patient and family. After consideration of risks, benefits and other options for treatment, the patient has consented to  Procedure(s): ENDARTERECTOMY CAROTID (Left) as a surgical intervention .  The patient's history has been reviewed, patient examined, no change in status, stable for surgery.  I have reviewed the patient's chart and labs.  Questions were answered to the patient's satisfaction.     [name]

## 2012-12-04 NOTE — Anesthesia Preprocedure Evaluation (Addendum)
Anesthesia Evaluation  Patient identified by MRN, date of birth, ID band Patient awake    Reviewed: Allergy & Precautions, H&P , NPO status , Patient's Chart, lab work & pertinent test results, reviewed documented beta blocker date and time   Airway Mallampati: II TM Distance: >3 FB Neck ROM: Full    Dental  (+) Dental Advisory Given, Upper Dentures and Teeth Intact   Pulmonary shortness of breath, with exertion, at rest, lying and Long-Term Oxygen Therapy, sleep apnea , COPD COPD inhaler and oxygen dependent, former smoker,          Cardiovascular hypertension, Pt. on medications + CAD, + Past MI, + Peripheral Vascular Disease and +CHF Rhythm:Regular Rate:Normal     Neuro/Psych PSYCHIATRIC DISORDERS Anxiety negative neurological ROS     GI/Hepatic Neg liver ROS, GERD-  Medicated and Controlled,  Endo/Other  diabetes, Type 2, Oral Hypoglycemic Agents  Renal/GU negative Renal ROS  negative genitourinary   Musculoskeletal negative musculoskeletal ROS (+)   Abdominal (+) + obese,   Peds  Hematology   Anesthesia Other Findings   Reproductive/Obstetrics negative OB ROS                         Anesthesia Physical Anesthesia Plan  ASA: III  Anesthesia Plan: General   Post-op Pain Management:    Induction: Intravenous  Airway Management Planned: Oral ETT  Additional Equipment: Arterial line  Intra-op Plan:   Post-operative Plan: Extubation in OR  Informed Consent: I have reviewed the patients History and Physical, chart, labs and discussed the procedure including the risks, benefits and alternatives for the proposed anesthesia with the patient or authorized representative who has indicated his/her understanding and acceptance.     Plan Discussed with: CRNA and Anesthesiologist  Anesthesia Plan Comments:         Anesthesia Quick Evaluation

## 2012-12-05 ENCOUNTER — Encounter (HOSPITAL_COMMUNITY): Payer: Self-pay | Admitting: Vascular Surgery

## 2012-12-05 LAB — BASIC METABOLIC PANEL
BUN: 11 mg/dL (ref 6–23)
CO2: 35 mEq/L — ABNORMAL HIGH (ref 19–32)
Chloride: 98 mEq/L (ref 96–112)
Creatinine, Ser: 0.68 mg/dL (ref 0.50–1.10)
Glucose, Bld: 161 mg/dL — ABNORMAL HIGH (ref 70–99)

## 2012-12-05 LAB — CBC
HCT: 39.8 % (ref 36.0–46.0)
MCV: 84.3 fL (ref 78.0–100.0)
RBC: 4.72 MIL/uL (ref 3.87–5.11)
WBC: 6.3 10*3/uL (ref 4.0–10.5)

## 2012-12-05 LAB — GLUCOSE, CAPILLARY: Glucose-Capillary: 153 mg/dL — ABNORMAL HIGH (ref 70–99)

## 2012-12-05 NOTE — Progress Notes (Signed)
Patient Name: Jillian Bright Date of Encounter: 12/05/2012     Principal Problem:   Occlusion and stenosis of carotid artery without mention of cerebral infarction Active Problems:   DIABETES MELLITUS, TYPE II   Generalized anxiety disorder   HYPERTENSION   PULMONARY HYPERTENSION   COPD with emphysema   GERD   Obesity hypoventilation syndrome   CAD (coronary artery disease)   Aortic stenosis    SUBJECTIVE: Feels well today. No chest pain or shortness of breath.    OBJECTIVE  Filed Vitals:   12/05/12 0500 12/05/12 0633 12/05/12 0700 12/05/12 0706  BP: 115/61 114/67 144/97 100/60  Pulse: 83     Temp:      TempSrc:      Resp: 15 23 16 16   Height:      Weight:      SpO2: 94%       Intake/Output Summary (Last 24 hours) at 12/05/12 0816 Last data filed at 12/05/12 0700  Gross per 24 hour  Intake   2370 ml  Output   1425 ml  Net    945 ml   Weight change:   PHYSICAL EXAM  General: Well developed, well nourished, in no acute distress. Head: Normocephalic, atraumatic, sclera non-icteric, no xanthomas, nares are without discharge. Neck: Supple without bruits or JVD. L CEA incision appreciated. No erythema, swelling or discharge.  Lungs: Distant breath sounds. Resp regular and unlabored, CTAB without wheezes, rales or rhonchi.  Heart: RRR no s3, s4, II/VI systolic crescendo-decrescendo murmur at RUSB Abdomen: Soft, non-tender, distention of central adiposity, BS + x 4.  Msk:  Strength and tone appears normal for age. Extremities: Trace bilateral pretibial edema. No clubbing or cyanosis. DP/PT/Radials 2+ and equal bilaterally. Neuro: Alert and oriented X 3. Moves all extremities spontaneously. Psych: Normal affect.  LABS:  Recent Labs     12/04/12  1640  12/05/12  0410  WBC  6.0  6.3  HGB  11.0*  12.0  HCT  36.0  39.8  MCV  83.7  84.3  PLT  132*  128*   Recent Labs Lab 12/03/12 1050 12/04/12 1640 12/05/12 0410  NA 137  --  139  K 4.4  --  4.0  CL  95*  --  98  CO2 33*  --  35*  BUN 17  --  11  CREATININE 0.71 0.85 0.68  CALCIUM 9.7  --  8.5  PROT 7.5  --   --   BILITOT 0.4  --   --   ALKPHOS 67  --   --   ALT 15  --   --   AST 26  --   --   GLUCOSE 150*  --  161*   Recent Labs     12/02/12  1500  HGBA1C  8.3*   TELE: NSR, 80-90 bpm  Radiology/Studies:  Nm Myocar Multi W/spect W/wall Motion / Ef  12/03/2012   *RADIOLOGY REPORT*  Clinical Data:  Chest tightness.  Technique:  Standard myocardial SPECT imaging performed after resting intravenous injection of 10 mCi Tc-16m sestimibi. Subsequently, intravenous infusion of regadenoson performed under the supervision of the Cardiology staff.  At peak effect of the drug, 30 mCi Tc-45m sestimibi injected intravenously and standard myocardial SPECT imaging performed.  Quantitative gated imaging also performed to evaluate left ventricular wall motion and estimate left ventricular ejection fraction.  Comparison:  None  MYOCARDIAL IMAGING WITH SPECT (REST AND PHARMACOLOGIC-STRESS)  Findings:  The myocardial perfusion is normal.  No evidence for a reversible or fixed defect.  GATED LEFT VENTRICULAR WALL MOTION STUDY  Findings:  Review of the gated images demonstrates normal wall motion.  LEFT VENTRICULAR EJECTION FRACTION  Findings:  QGS ejection fraction measures 69% , with an end- diastolic volume of 71 ml and an end-systolic volume of 22 ml.  IMPRESSION: Normal myocardial perfusion examination.  No evidence for pharmacologically induced ischemia.  Calculated ejection fraction is 69%.   Original Report Authenticated By: Richarda Overlie, M.D.    Current Medications:  . aspirin EC  325 mg Oral Daily  . atorvastatin  20 mg Oral q1800  . budesonide-formoterol  2 puff Inhalation BID  . darifenacin  7.5 mg Oral Daily  . docusate sodium  100 mg Oral Daily  . enoxaparin (LOVENOX) injection  40 mg Subcutaneous Q24H  . furosemide  80 mg Oral Daily  . gabapentin  300 mg Oral BID  . glipiZIDE  10 mg Oral BID  AC  . insulin aspart  0-15 Units Subcutaneous TID WC  . insulin aspart  0-5 Units Subcutaneous QHS  . linagliptin  5 mg Oral Daily  . metFORMIN  1,000 mg Oral BID WC  . pantoprazole  40 mg Oral Daily  . potassium chloride SA  20 mEq Oral BID  . spironolactone  25 mg Oral Daily  . tiotropium  18 mcg Inhalation Daily    ASSESSMENT AND PLAN:  1. Carotid artery disease- s/p L CEA yesterday. Transient hypotension post-op.    2. CAD- intermittent chest pain. Lexiscan Myoview pre-op w/o evidence of ischemia. EF 69%. Resume ASA/statin when appropriate by [facility].   3. Chronic diastolic CHF- weights suggest substantial increase from 7/8 to 7/10 (205->225 lbs), ? accuracy. I/O reflects overall negative output just over 500 mL. By exam, only bilateral pretibial edema. JVP difficult to appreciate. Lungs clear but distant breath sounds. Not dyspneic nor hypoxic. Continue outpatient Lasix. Monitor weights and CHF symptoms (edema, abdominal distention, shortness of breath, etc). Advise to take additional 1/2 tab for weight gain > 3 lbs in a day or symptoms.   4. AS- II/VI sys murmur at RUSB; mild-mod by echo.   5. Hypotension- transient pressor support needed post-op. Dopamine weaned this AM. Normotensive.   Dispo- [facility] planning on d/c today. Stable from cardiac standpoint.   Signed, R. Hurman Horn, PA-C 12/05/2012, 8:16 AM Agree with above assessment and plan. OK for discharge today from cardiac standpoint. No respiratory distress. Lungs clear. BP acceptable now. Continue outpatient lasix.

## 2012-12-05 NOTE — Progress Notes (Addendum)
[facility] Progress Note  12/05/2012 7:19 AM 1 Day Post-Op  Subjective:  No complaints as far as surgery goes  Gtts:  Dopamine @ 1mcg/kg/min-off at 0600 this am  80's-110's systolic (115 this am) HR 60's-90's regular 94% 5LO2NC TM 99 now afebrile  Filed Vitals:   12/05/12 0500  BP: 115/61  Pulse: 83  Temp:   Resp: 15     Physical Exam: Neuro:  In tact Incision:  C/d/i;   CBC    Component Value Date/Time   WBC 6.3 12/05/2012 0410   RBC 4.72 12/05/2012 0410   HGB 12.0 12/05/2012 0410   HCT 39.8 12/05/2012 0410   PLT 128* 12/05/2012 0410   MCV 84.3 12/05/2012 0410   MCH 25.4* 12/05/2012 0410   MCHC 30.2 12/05/2012 0410   RDW 17.1* 12/05/2012 0410   LYMPHSABS 1.5 11/24/2012 1031   MONOABS 0.5 11/24/2012 1031   EOSABS 0.1 11/24/2012 1031   BASOSABS 0.0 11/24/2012 1031    BMET    Component Value Date/Time   NA 139 12/05/2012 0410   K 4.0 12/05/2012 0410   CL 98 12/05/2012 0410   CO2 35* 12/05/2012 0410   GLUCOSE 161* 12/05/2012 0410   BUN 11 12/05/2012 0410   CREATININE 0.68 12/05/2012 0410   CALCIUM 8.5 12/05/2012 0410   GFRNONAA 88* 12/05/2012 0410   GFRAA >90 12/05/2012 0410     Intake/Output Summary (Last 24 hours) at 12/05/12 0719 Last data filed at 12/05/12 0500  Gross per 24 hour  Intake   2230 ml  Output   1125 ml  Net   1105 ml      Assessment/Plan:  This is a 67 y.o. female who is s/p left CEA 1 Day Post-Op  -pt is doing well this am. -pt neuro exam is in tact -pt has ambulated -pt has been off the dopamine for close to 2 hours. -pt is on continuous home O2 -pt does not have anyone to stay with her at home.  She states that she has friends who can come and check on her. -will let pt eat breakfast, continue to monitor her BP and hopefully discharge her later this morning.   Doreatha Massed, PA-C [facility] [phone]  Addendum  I have independently interviewed and examined the patient, and I agree with the  physician assistant's findings.  Pt's hypotension was transient.  She was never on pressor dose Dopamine so I doubt she will have any problems maintaining her BP, but will wait until afternoon before d/c.  Pt's neuro exam is appropriate with sym motor and midline tongue protrusion.  Incision is appropriate without hematoma.  Ok for D/C this afternoon if BP ok.  Leonides Sake, MD [facility] [place] Office: [phone] Pager: [phone]  12/05/2012, 8:57 AM

## 2012-12-05 NOTE — Discharge Summary (Signed)
[facility] Discharge Summary  Jillian Bright [birth year] 66 y.o. female  [id number]  Admission Date: 12/02/2012  Discharge Date: 12/05/12  Physician: Pryor Ochoa, MD  Admission Diagnosis: Severe Left ICA stenosis  Severe Left ICA Stenosis    HPI:   This is a 67 y.o. female with Severe left ICA stenosis.-Asymptomatic. History of PTCA and stenting 2002. Patient has mild chest discomfort. Echocardiogram revealed EF of 55-60% 4 nuclear stress test today. Plan left carotid endarterectomy in a.m. if cleared by cardiology.  She did have an echo preoperatively, which revealed an EF of 69%.  She is low risk for surgery.   Hospital Course:  The patient was admitted to the hospital and taken to the operating room on 12/02/2012 - 12/04/2012 and underwent left carotid endarterectomy.  The pt tolerated the procedure well and was transported to the PACU in good condition.   By POD 1, the pt neuro status was in tact.  She did require low dose dopamine, but this was weaned successfully.   She has maintained her BP.  The remainder of the hospital course consisted of increasing mobilization and increasing intake of solids without difficulty.    Recent Labs  12/03/12 1050 12/05/12 0410  NA 137 139  K 4.4 4.0  CL 95* 98  CO2 33* 35*  GLUCOSE 150* 161*  BUN 17 11  CALCIUM 9.7 8.5    Recent Labs  12/04/12 1640 12/05/12 0410  WBC 6.0 6.3  HGB 11.0* 12.0  HCT 36.0 39.8  PLT 132* 128*    Recent Labs  12/03/12 1050  INR 1.03    Discharge Instructions:   The patient is discharged to home with extensive instructions on wound care and progressive ambulation.  They are instructed not to drive or perform any heavy lifting until returning to see the physician in his office.  Discharge Orders   Future Appointments Provider Department Dept Phone   12/16/2012 8:45 AM Pryor Ochoa, MD [facility] [phone]   Future Orders Complete By  Expires     CAROTID Sugery: Call MD for difficulty swallowing or speaking; weakness in arms or legs that is a new symtom; severe headache.  If you have increased swelling in the neck and/or  are having difficulty breathing, CALL 911  As directed     Call MD for:  redness, tenderness, or signs of infection (pain, swelling, bleeding, redness, odor or green/yellow discharge around incision site)  As directed     Call MD for:  severe or increased pain, loss or decreased feeling  in affected limb(s)  As directed     Call MD for:  temperature >100.5  As directed     Driving Restrictions  As directed     Comments:      No driving for 2 weeks    Increase activity slowly  As directed     Comments:      Walk with assistance use walker or cane as needed    Lifting restrictions  As directed     Comments:      No lifting for 4 weeks    May shower   As directed     Scheduling Instructions:      Saturday    No dressing needed  As directed     Resume previous diet  As directed     may wash over wound with mild soap and water  As directed  Discharge Diagnosis:  Severe Left ICA stenosis  Severe Left ICA Stenosis   Secondary Diagnosis: Patient Active Problem List   Diagnosis Date Noted  . Occlusion and stenosis of carotid artery without mention of cerebral infarction 12/02/2012  . Aortic stenosis 01/14/2012  . Mediastinal adenopathy 01/04/2012  . CAD (coronary artery disease) 11/07/2011  . Obesity hypoventilation syndrome 06/10/2009  . 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 PULMONARY HYPERTENSION 05/25/2009  . ABDOMINAL PAIN, LOWER 08/04/2008  . DIABETES MELLITUS, TYPE II 08/18/2007  . CONGESTIVE HEART FAILURE 08/18/2007  . Other and Unspecified Hyperlipidemia 08/04/2007  . Generalized anxiety disorder 08/04/2007  . HYPERTENSION 08/04/2007  . GERD 08/04/2007  . DEGENERATIVE JOINT DISEASE 08/04/2007  . DEPENDENT EDEMA 08/04/2007   Past Medical History  Diagnosis Date  . Diabetes  mellitus   . COPD (chronic obstructive pulmonary disease)     sees Dr. Coralyn Helling  . Sleep apnea, obstructive   . Pulmonary HTN   . Obesity (BMI 30.0-34.9)   . Hyperlipidemia   . Myocardial infarction   . Coronary artery disease   . Anxiety   . Shortness of breath   . GERD (gastroesophageal reflux disease)   . Arthritis       Medication List         albuterol 108 (90 BASE) MCG/ACT inhaler  Commonly known as:  PROVENTIL HFA;VENTOLIN HFA  Inhale 2 puffs into the lungs every 6 (six) hours as needed for wheezing or shortness of breath.     ALPRAZolam 0.5 MG tablet  Commonly known as:  XANAX  Take 0.5 mg by mouth 3 (three) times daily as needed for sleep or anxiety.     aspirin EC 81 MG tablet  Take 1 tablet (81 mg total) by mouth daily.     budesonide-formoterol 160-4.5 MCG/ACT inhaler  Commonly known as:  SYMBICORT  Inhale 2 puffs into the lungs 2 (two) times daily.     furosemide 40 MG tablet  Commonly known as:  LASIX  Take 80 mg by mouth daily.     gabapentin 300 MG capsule  Commonly known as:  NEURONTIN  Take 300 mg by mouth 2 (two) times daily.     glipiZIDE 10 MG tablet  Commonly known as:  GLUCOTROL  Take 10 mg by mouth 2 (two) times daily before a meal.     glucose blood test strip  Commonly known as:  ACCU-CHEK AVIVA PLUS  Test once per day and diagnosis code is 250.00     guaiFENesin 600 MG 12 hr tablet  Commonly known as:  MUCINEX  Take 1,200 mg by mouth 2 (two) times daily as needed for congestion.     HYDROcodone-acetaminophen 10-325 MG per tablet  Commonly known as:  NORCO  Take 1 tablet by mouth every 6 (six) hours as needed for pain.     metFORMIN 1000 MG tablet  Commonly known as:  GLUCOPHAGE  Take 1 tablet (1,000 mg total) by mouth 2 (two) times daily with a meal.     omeprazole 40 MG capsule  Commonly known as:  PRILOSEC  Take 40 mg by mouth daily before breakfast.     potassium chloride SA 20 MEQ tablet  Commonly known as:   K-DUR,KLOR-CON  Take 20 mEq by mouth 2 (two) times daily.     rosuvastatin 40 MG tablet  Commonly known as:  CRESTOR  Take 1 tablet (40 mg total) by mouth daily.     sitaGLIPtin 100 MG tablet  Commonly known  as:  JANUVIA  Take 1 tablet (100 mg total) by mouth daily.     solifenacin 5 MG tablet  Commonly known as:  VESICARE  Take 10 mg by mouth daily.     spironolactone 25 MG tablet  Commonly known as:  ALDACTONE  Take 1 tablet (25 mg total) by mouth daily.     tiotropium 18 MCG inhalation capsule  Commonly known as:  SPIRIVA  Place 18 mcg into inhaler and inhale daily.        Roxicodone #30 No Refill  Disposition: home  Patient's condition: is Good  Follow up: 1. Dr.  Hart Rochester in 2 weeks.   Doreatha Massed, PA-C [facility] [phone]  --- For [facility] use --- Instructions: Press F2 to tab through selections.  Delete question if not applicable.   Modified Rankin score at D/C (0-6): 0  IV medication needed for:  1. Hypertension: No 2. Hypotension: Yes low dose dopamine  Post-op Complications: No  1. Post-op CVA or TIA: No  If yes: Event classification (right eye, left eye, right cortical, left cortical, verterobasilar, other): n/a  If yes: Timing of event (intra-op, <6 hrs post-op, >=6 hrs post-op, unknown): n/a  2. CN injury: No  If yes: CN n/a injuried   3. Myocardial infarction: No  If yes: Dx by (EKG or clinical, Troponin): n/a  4.  CHF: No  5.  Dysrhythmia (new): No  6. Wound infection: No  7. Reperfusion symptoms: No  8. Return to OR: No  If yes: return to OR for (bleeding, neurologic, other CEA incision, other): n/a  Discharge medications: Statin use:  Yes If No: [ ]  For Medical reasons, [ ]  Non-compliant, [ ]  Not-indicated ASA use:  Yes  If No: [ ]  For Medical reasons, [ ]  Non-compliant, [ ]  Not-indicated Beta blocker use:  No If No: [ ]  For Medical reasons, [ ]  Non-compliant, [ ]  Not-indicated ACE-Inhibitor  use:  No If No: [ ]  For Medical reasons, [ ]  Non-compliant, [ ]  Not-indicated P2Y12 Antagonist use: no, [ ]  Plavix, [ ]  Plasugrel, [ ]  Ticlopinine, [ ]  Ticagrelor, [ ]  Other, [ ]  No for medical reason, [ ]  Non-compliant, [ ]  Not-indicated Anti-coagulant use:  no, [ ]  Warfarin, [ ]  Rivaroxaban, [ ]  Dabigatran, [ ]  Other, [ ]  No for medical reason, [ ]  Non-compliant, [ ]  Not-indicated

## 2012-12-05 NOTE — Progress Notes (Signed)
Pt. was on dopamine drip tirtrated down and weaned off this am. Ambulated  x2 around the hallway. Pt. tolerated well. BP was taken after pt. weaned off from dopamine and after pt. back to chair. BP stable with the MAP >65. Pt. comfortable at present.

## 2012-12-15 ENCOUNTER — Encounter: Payer: Self-pay | Admitting: Vascular Surgery

## 2012-12-16 ENCOUNTER — Ambulatory Visit (INDEPENDENT_AMBULATORY_CARE_PROVIDER_SITE_OTHER): Payer: Medicare Other | Admitting: Vascular Surgery

## 2012-12-16 ENCOUNTER — Encounter: Payer: Self-pay | Admitting: Vascular Surgery

## 2012-12-16 DIAGNOSIS — I6529 Occlusion and stenosis of unspecified carotid artery: Secondary | ICD-10-CM

## 2012-12-16 DIAGNOSIS — Z48812 Encounter for surgical aftercare following surgery on the circulatory system: Secondary | ICD-10-CM

## 2012-12-16 NOTE — Addendum Note (Signed)
Addended by: Adria Dill L on: 12/16/2012 12:06 PM   Modules accepted: Orders

## 2012-12-16 NOTE — Progress Notes (Signed)
Subjective:     Patient ID: Jillian Bright, female   DOB: 09-Nov-1945, 67 y.o.   MRN: [id number]  HPI this 67 year old female returns 2 weeks post left carotid endarterectomy for severe left ICA stenosis. She has done well since her surgery. She denies any lateralizing weakness, aphasia, amaurosis fugax, diplopia, blurred vision, or syncope. She is breathing fairly comfortably with her home oxygen. She is swallowing well and has no hoarseness.     Review of Systems     Objective:   Physical Exam BP 105/73  Pulse 94  Temp(Src) 98.1 F (36.7 C) (Oral)  Ht 5\' 6"  (1.676 m)  Wt 219 lb 8 oz (99.565 kg)  BMI 35.45 kg/m2  SpO2 95%  General well-developed well-nourished female in no apparent stress alert and oriented x3 on nasal oxygen Neck incision well healed on the left with 3+ pulses and no audible bruit Neurologic exam normal     Assessment:     Doing well 2 weeks post left carotid endarterectomy for severe left ICA stenosis    Plan:     Return in 6 months for followup carotid duplex exam and continue daily aspirin

## 2012-12-27 ENCOUNTER — Other Ambulatory Visit: Payer: Self-pay | Admitting: Family Medicine

## 2012-12-29 NOTE — Telephone Encounter (Signed)
Can we refill this? 

## 2013-01-27 ENCOUNTER — Encounter: Payer: Self-pay | Admitting: Pulmonary Disease

## 2013-01-27 ENCOUNTER — Ambulatory Visit (INDEPENDENT_AMBULATORY_CARE_PROVIDER_SITE_OTHER): Payer: Medicare Other | Admitting: Pulmonary Disease

## 2013-01-27 VITALS — BP 112/84 | HR 98 | Temp 98.2°F | Ht 66.0 in | Wt 216.0 lb

## 2013-01-27 DIAGNOSIS — E662 Morbid (severe) obesity with alveolar hypoventilation: Secondary | ICD-10-CM

## 2013-01-27 DIAGNOSIS — J438 Other emphysema: Secondary | ICD-10-CM

## 2013-01-27 DIAGNOSIS — I6529 Occlusion and stenosis of unspecified carotid artery: Secondary | ICD-10-CM

## 2013-01-27 DIAGNOSIS — J439 Emphysema, unspecified: Secondary | ICD-10-CM

## 2013-01-27 NOTE — Progress Notes (Signed)
Chief Complaint  Patient presents with  . COPD    Breathing is unchanged. Has good days and bad. Denies chest tightness, SOB, coughing.    History of Present Illness: Jillian Bright is a 67 y.o. female former smoker with COPD, and OSA/OHS unable to tolerate CPAP.  She has not smoked in almost 1 year.    Her breathing is doing better.  She is no longer using spiriva or symbicort.  She has not needed to use albuterol in months.  She denies cough, wheeze, or sputum.  She does still get leg swelling at times.  She uses her oxygen at night, and frequently during the day.  She had CEA last summer on the Lt.  She has noticed numbness in her Lt neck since.  Tests: PFT 06/13/09>>FEV1 1.97(76%), FEV1% 73, TLC 5.35(101%), DLCO 65% ABG 06/13/09 >>7.44/50.6/55 on room air  PSG 06/19/09 >>AHI 13  PFT 12/25/11>>FEV1 1.17 (52%), FEV1% 58, TLC 3.67 (70%), DLCO 50%, +BD CT chest 12/31/11>>emphysema, 1.5 cm precarinal node CT cervical spine 01/03/12>>11 x 8 mm LUL nodule Echo 02/02/12>>EF 60 to 65%, grade 1 diastolic dysfx, mild AS, mod RV systolic dysfx, PAS 32 mmHg CT chest 06/30/12 >> 1.3 cm precarinal node no change  She  has a past medical history of Diabetes mellitus; COPD (chronic obstructive pulmonary disease); Sleep apnea, obstructive; Pulmonary HTN; Obesity (BMI 30.0-34.9); Hyperlipidemia; Myocardial infarction; Coronary artery disease; Anxiety; Shortness of breath; GERD (gastroesophageal reflux disease); and Arthritis.  She  has past surgical history that includes Appendectomy; Tonsillectomy and adenoidectomy; Lumbar disc surgery; Hemorrhoid surgery; Coronary angioplasty; Endarterectomy (Left, 12/04/2012); and Patch angioplasty (Left, 12/04/2012).  Outpatient Encounter Prescriptions as of 01/27/2013  Medication Sig Dispense Refill  . albuterol (PROVENTIL HFA;VENTOLIN HFA) 108 (90 BASE) MCG/ACT inhaler Inhale 2 puffs into the lungs every 6 (six) hours as needed for wheezing or shortness of  breath.      . ALPRAZolam (XANAX) 0.5 MG tablet Take 0.5 mg by mouth 3 (three) times daily as needed for sleep or anxiety.      Marland Kitchen aspirin EC 81 MG tablet Take 1 tablet (81 mg total) by mouth daily.      . budesonide-formoterol (SYMBICORT) 160-4.5 MCG/ACT inhaler Inhale 2 puffs into the lungs 2 (two) times daily.      . furosemide (LASIX) 40 MG tablet       . gabapentin (NEURONTIN) 300 MG capsule Take 300 mg by mouth 2 (two) times daily.      Marland Kitchen glipiZIDE (GLUCOTROL) 10 MG tablet Take 10 mg by mouth 2 (two) times daily before a meal.      . glucose blood (ACCU-CHEK AVIVA PLUS) test strip Test once per day and diagnosis code is 250.00  100 each  1  . guaiFENesin (MUCINEX) 600 MG 12 hr tablet Take 1,200 mg by mouth 2 (two) times daily as needed for congestion.      Marland Kitchen HYDROcodone-acetaminophen (NORCO) 10-325 MG per tablet Take 1 tablet by mouth every 6 (six) hours as needed for pain.  60 tablet  5  . metFORMIN (GLUCOPHAGE) 1000 MG tablet Take 1 tablet (1,000 mg total) by mouth 2 (two) times daily with a meal.  60 tablet  11  . omeprazole (PRILOSEC) 40 MG capsule Take 40 mg by mouth daily before breakfast.      . potassium chloride SA (K-DUR,KLOR-CON) 20 MEQ tablet Take 20 mEq by mouth 2 (two) times daily.      . rosuvastatin (CRESTOR) 40 MG tablet Take 1  tablet (40 mg total) by mouth daily.  90 tablet  3  . sitaGLIPtin (JANUVIA) 100 MG tablet Take 1 tablet (100 mg total) by mouth daily.  30 tablet  11  . solifenacin (VESICARE) 5 MG tablet Take 10 mg by mouth daily.      Marland Kitchen spironolactone (ALDACTONE) 25 MG tablet Take 1 tablet (25 mg total) by mouth daily.  30 tablet  11  . tiotropium (SPIRIVA) 18 MCG inhalation capsule Place 18 mcg into inhaler and inhale daily.      . [DISCONTINUED] furosemide (LASIX) 40 MG tablet TAKE 2 TABLETS (80 MG TOTAL) BY MOUTH 2 (TWO) TIMES DAILY.  120 tablet  7  . [DISCONTINUED] furosemide (LASIX) 40 MG tablet Take 80 mg by mouth daily.        No facility-administered  encounter medications on file as of 01/27/2013.    Allergies  Allergen Reactions  . Codeine     Takes vicodin at home    Physical Exam:  General - No distress ENT - no sinus tenderness, no oral exudate, healed scar Lt neck Cardiac - s1s2 regular, 2/6 SM Chest - no wheeze/rales Back - no focal tenderness Abd - soft, non-tender Ext - no edema Neuro - normal strength Skin - no rashes Psych - normal mood, and behavior   Assessment/Plan:  Coralyn Helling, MD [facility] Pager:  [phone] 01/27/2013, 1:39 PM

## 2013-01-27 NOTE — Assessment & Plan Note (Signed)
She is to continue with supplemental oxygen at night and as needed during the day.

## 2013-01-27 NOTE — Assessment & Plan Note (Signed)
Much improved since she quit smoking 1 year ago.  She is not longer needing spiriva or symbicort.  She can use albuterol as needed.

## 2013-01-27 NOTE — Assessment & Plan Note (Signed)
She has discomfort in her Lt neck after endarterectomy.  Advised her to d/w vascular surgery.

## 2013-01-27 NOTE — Patient Instructions (Signed)
Follow up in 6 months 

## 2013-02-03 ENCOUNTER — Ambulatory Visit (INDEPENDENT_AMBULATORY_CARE_PROVIDER_SITE_OTHER): Payer: Medicare Other | Admitting: Family Medicine

## 2013-02-03 ENCOUNTER — Encounter: Payer: Self-pay | Admitting: Family Medicine

## 2013-02-03 VITALS — BP 124/70 | HR 107 | Temp 98.4°F | Wt 217.0 lb

## 2013-02-03 DIAGNOSIS — M129 Arthropathy, unspecified: Secondary | ICD-10-CM

## 2013-02-03 DIAGNOSIS — M1990 Unspecified osteoarthritis, unspecified site: Secondary | ICD-10-CM

## 2013-02-03 DIAGNOSIS — E119 Type 2 diabetes mellitus without complications: Secondary | ICD-10-CM

## 2013-02-03 DIAGNOSIS — I2510 Atherosclerotic heart disease of native coronary artery without angina pectoris: Secondary | ICD-10-CM

## 2013-02-03 DIAGNOSIS — I10 Essential (primary) hypertension: Secondary | ICD-10-CM

## 2013-02-03 DIAGNOSIS — J439 Emphysema, unspecified: Secondary | ICD-10-CM

## 2013-02-03 DIAGNOSIS — J438 Other emphysema: Secondary | ICD-10-CM

## 2013-02-03 LAB — SEDIMENTATION RATE: Sed Rate: 53 mm/hr — ABNORMAL HIGH (ref 0–22)

## 2013-02-03 LAB — RHEUMATOID FACTOR: Rhuematoid fact SerPl-aCnc: 65 IU/mL — ABNORMAL HIGH (ref <=14)

## 2013-02-03 MED ORDER — MELOXICAM 15 MG PO TABS: 15.0000 mg | ORAL_TABLET | Freq: Every day | ORAL | Status: DC

## 2013-02-03 MED ORDER — METHYLPREDNISOLONE ACETATE 80 MG/ML IJ SUSP
120.0000 mg | Freq: Once | INTRAMUSCULAR | Status: AC
  Administered 2013-02-03: 120 mg via INTRAMUSCULAR

## 2013-02-03 NOTE — Addendum Note (Signed)
Addended by: Aniceto Boss A on: 02/03/2013 03:49 PM   Modules accepted: Orders

## 2013-02-03 NOTE — Progress Notes (Signed)
  Subjective:    Patient ID: Jillian Bright, female    DOB: 02/18/1946, 67 y.o.   MRN: [id number]  HPI Here for 2 weeks of the sudden onset of swelling and pain in multiple joints. She has had arthritis with pain and stiffness in the joints for years, but she has never felt like this before. Her joints are tender to the touch and quite painful. This primarily involves the wrists and hands, shoulders, and hips. No fevers. Using Vicodin with poor results.    Review of Systems  Constitutional: Negative.   Musculoskeletal: Positive for joint swelling and arthralgias.       Objective:   Physical Exam  Constitutional:  In pain, walking with a cane   Musculoskeletal:  Tender along the PIPs and MCPs of both hands with some synovial swelling          Assessment & Plan:  She seems to have developed an inflammatory arthritis on top of the osteoarthritis she has had. Given a steroid shot. Start on Meloxicam. Get labs today

## 2013-02-05 NOTE — Addendum Note (Signed)
Addended by: Gershon Crane A on: 02/05/2013 09:06 AM   Modules accepted: Orders

## 2013-02-11 ENCOUNTER — Other Ambulatory Visit: Payer: Self-pay | Admitting: Family Medicine

## 2013-02-11 NOTE — Telephone Encounter (Signed)
Refill both for 6 months. 

## 2013-02-12 ENCOUNTER — Encounter: Payer: Self-pay | Admitting: Family Medicine

## 2013-02-13 ENCOUNTER — Other Ambulatory Visit: Payer: Self-pay | Admitting: Family Medicine

## 2013-02-13 ENCOUNTER — Telehealth: Payer: Self-pay | Admitting: Family Medicine

## 2013-02-13 NOTE — Telephone Encounter (Signed)
I spoke with pt and gave results, also she is coming in on 02/16/13 to see Dr. Clent Ridges, legs are still swollen. I did let pt know that her appointment was scheduled for 03/04/13 with Dr. Dareen Piano at 8:30 am.

## 2013-02-13 NOTE — Telephone Encounter (Signed)
Pt would like you to call her concerning her leg asap..  Pt's leg is not better. Pt refused triage nurse and refused to see another provider.

## 2013-02-13 NOTE — Telephone Encounter (Signed)
Call her to get more info

## 2013-02-16 ENCOUNTER — Ambulatory Visit (INDEPENDENT_AMBULATORY_CARE_PROVIDER_SITE_OTHER): Payer: Medicare Other | Admitting: Family Medicine

## 2013-02-16 ENCOUNTER — Encounter: Payer: Self-pay | Admitting: Family Medicine

## 2013-02-16 ENCOUNTER — Encounter (INDEPENDENT_AMBULATORY_CARE_PROVIDER_SITE_OTHER): Payer: Medicare Other

## 2013-02-16 VITALS — BP 120/70 | HR 104 | Temp 98.0°F

## 2013-02-16 DIAGNOSIS — M25561 Pain in right knee: Secondary | ICD-10-CM

## 2013-02-16 DIAGNOSIS — M7989 Other specified soft tissue disorders: Secondary | ICD-10-CM

## 2013-02-16 DIAGNOSIS — M79609 Pain in unspecified limb: Secondary | ICD-10-CM

## 2013-02-16 DIAGNOSIS — M069 Rheumatoid arthritis, unspecified: Secondary | ICD-10-CM

## 2013-02-16 DIAGNOSIS — M25569 Pain in unspecified knee: Secondary | ICD-10-CM

## 2013-02-16 MED ORDER — PREDNISONE 10 MG PO TABS: 40.0000 mg | ORAL_TABLET | Freq: Every day | ORAL | Status: DC

## 2013-02-16 NOTE — Progress Notes (Signed)
  Subjective:    Patient ID: Jillian Bright, female    DOB: [birth year], 67 y.o.   MRN: [id number]  HPI Here for several things. She saw [place] a few weeks ago for the recent onset of diffuse joint pains and joint swelling. Her ESR and her rheumatoid factor were both elevated, and this was consistent with rheumatoid arthritis. She was given a steroid shot that day and this helped for a few days, but the pains quickly returned. Now she is taking Meloxicam with no benefit. Also in the last 5 days her right lower leg has become much more swollen than the left leg, and the calf is painful. No chest pains or SOB.    Review of Systems  Constitutional: Negative.   Respiratory: Negative.   Cardiovascular: Negative for chest pain and palpitations.  Musculoskeletal: Positive for joint swelling and arthralgias.       Objective:   Physical Exam  Constitutional:  In pain, she walks slowly   Cardiovascular: Normal rate, regular rhythm, normal heart sounds and intact distal pulses.   Pulmonary/Chest: Effort normal and breath sounds normal. No respiratory distress. She has no wheezes. She has no rales.  Musculoskeletal:  Very tender in multiple joints. The right lower leg is swollen, warm, and slightly pink. The calf is tight and quite tender. No cords are felt.           Assessment & Plan:  She has newly diagnosed rheumatoid arthritis, and she is scheduled to see Dr. Azzie Roup (Rheumatology) in about 2 weeks. In the meantime we will start her on oral Prednisone to get the inflammation under control, to take 40 mg daily for now. I am concerned about the possibility of a DVT in the right leg so we will set her up for a venous doppler.

## 2013-02-16 NOTE — Telephone Encounter (Signed)
Call in Xanax #30 with 5 rf, also Omeprazole 40 mg #30 with 11 rf

## 2013-02-18 ENCOUNTER — Telehealth: Payer: Self-pay | Admitting: Family Medicine

## 2013-02-18 MED ORDER — ATORVASTATIN CALCIUM 20 MG PO TABS: 20.0000 mg | ORAL_TABLET | Freq: Every day | ORAL | Status: DC

## 2013-02-18 NOTE — Telephone Encounter (Signed)
I spoke with pt and gave results. Also pt has stopped the Crestor due to cost. Pt has tried Lipitor in the past. Can we change to this and call [facility] a [facility] supply [facility]?

## 2013-02-18 NOTE — Telephone Encounter (Signed)
Call in Lipitor 20 mg a day for one year

## 2013-02-18 NOTE — Telephone Encounter (Signed)
I sent script e-scribe and spoke with pt. 

## 2013-05-13 ENCOUNTER — Telehealth: Payer: Self-pay | Admitting: Family Medicine

## 2013-05-13 NOTE — Telephone Encounter (Signed)
Pt has been on prednisone since sept 2014. Pt would like to know can she get a flu shot

## 2013-05-14 NOTE — Telephone Encounter (Signed)
Yes she can get a flu shot

## 2013-05-14 NOTE — Telephone Encounter (Signed)
Pt will go to local pharm to have vaccine

## 2013-05-29 ENCOUNTER — Telehealth: Payer: Self-pay | Admitting: Family Medicine

## 2013-05-29 NOTE — Telephone Encounter (Signed)
Requesting refill of ALPRAZolam (XANAX) 0.5 MG tablet and glipiZIDE (GLUCOTROL) 10 MG tablet  pt states historically she has been getting # 90 for alprozom

## 2013-05-29 NOTE — Telephone Encounter (Signed)
Requesting refill of ALPRAZolam (XANAX) 0.5 MG tablet and glipiZIDE (GLUCOTROL) 10 MG tablet.  Pt states she has previously been getting #90 of alprazolam but is now getting #30 which is not enough.  She is requesting #90.  Ph[facility]acy [facility]

## 2013-06-01 MED ORDER — ALPRAZOLAM 0.5 MG PO TABS: 0.5000 mg | ORAL_TABLET | Freq: Three times a day (TID) | ORAL | Status: DC | PRN

## 2013-06-01 MED ORDER — GLIPIZIDE 10 MG PO TABS: 10.0000 mg | ORAL_TABLET | Freq: Two times a day (BID) | ORAL | Status: DC

## 2013-06-01 NOTE — Telephone Encounter (Addendum)
Pt would like to add spironolactone (ALDACTONE) 25 MG tablet   1/ day To this refill request Pt has been out of these meds since Friday.  [facility] [address].  Dr fry has already approved these refills , on Friday. but pharm does not have .

## 2013-06-01 NOTE — Telephone Encounter (Signed)
I called in Xanax, also sent Glipizide e-scribe and the pharmacy still has refills left on the Aldactone. I spoke with pt and gave this information.

## 2013-06-01 NOTE — Telephone Encounter (Signed)
Call in Xanax #90 with 5 rf and refill Glipizide for one year

## 2013-06-08 ENCOUNTER — Encounter: Payer: Self-pay | Admitting: Family Medicine

## 2013-06-08 ENCOUNTER — Ambulatory Visit (INDEPENDENT_AMBULATORY_CARE_PROVIDER_SITE_OTHER): Payer: Medicare Other | Admitting: Family Medicine

## 2013-06-08 VITALS — BP 120/68 | HR 115 | Temp 98.9°F | Wt 236.0 lb

## 2013-06-08 DIAGNOSIS — M797 Fibromyalgia: Secondary | ICD-10-CM

## 2013-06-08 DIAGNOSIS — F411 Generalized anxiety disorder: Secondary | ICD-10-CM

## 2013-06-08 DIAGNOSIS — I10 Essential (primary) hypertension: Secondary | ICD-10-CM

## 2013-06-08 DIAGNOSIS — M069 Rheumatoid arthritis, unspecified: Secondary | ICD-10-CM | POA: Insufficient documentation

## 2013-06-08 DIAGNOSIS — IMO0001 Reserved for inherently not codable concepts without codable children: Secondary | ICD-10-CM

## 2013-06-08 DIAGNOSIS — E119 Type 2 diabetes mellitus without complications: Secondary | ICD-10-CM

## 2013-06-08 LAB — BASIC METABOLIC PANEL
BUN: 16 mg/dL (ref 6–23)
CO2: 36 meq/L — AB (ref 19–32)
CREATININE: 0.9 mg/dL (ref 0.4–1.2)
Calcium: 8.9 mg/dL (ref 8.4–10.5)
Chloride: 90 mEq/L — ABNORMAL LOW (ref 96–112)
GFR: 66.8 mL/min (ref >60.00)
GLUCOSE: 284 mg/dL — AB (ref 70–99)
Potassium: 4.6 mEq/L (ref 3.5–5.1)
Sodium: 137 mEq/L (ref 135–145)

## 2013-06-08 LAB — HEMOGLOBIN A1C: HEMOGLOBIN A1C: 9.5 % — AB (ref 4.6–6.5)

## 2013-06-08 MED ORDER — DULOXETINE HCL 30 MG PO CPEP: 30.0000 mg | ORAL_CAPSULE | Freq: Every day | ORAL | Status: DC

## 2013-06-08 MED ORDER — PREDNISONE 5 MG PO TABS: 5.0000 mg | ORAL_TABLET | Freq: Every day | ORAL | Status: DC

## 2013-06-08 MED ORDER — GABAPENTIN 300 MG PO CAPS: 600.0000 mg | ORAL_CAPSULE | Freq: Three times a day (TID) | ORAL | Status: DC

## 2013-06-08 NOTE — Progress Notes (Signed)
Pre visit review using our clinic review tool, if applicable. No additional management support is needed unless otherwise documented below in the visit note. 

## 2013-06-08 NOTE — Progress Notes (Signed)
   Subjective:    Patient ID: Jillian Bright, female    DOB: 1945/06/06, 68 y.o.   MRN: [id number]  HPI Here to follow up on diffuse body aches. She sees Dr. Ouida Sills for rheumatoid arthritis and he has also diagnosed her with fibromyalgia. He has asked [place] to deal with this issue. She is already on Gabapentin and Vicodin, in addition to methotrexate. She feels bad most days and finds it hard to get out of the house.    Review of Systems  Constitutional: Negative.   Musculoskeletal: Positive for arthralgias, back pain, joint swelling and myalgias.       Objective:   Physical Exam  Constitutional: She appears well-developed and well-nourished.  Cardiovascular: Normal rate, regular rhythm, normal heart sounds and intact distal pulses.   Pulmonary/Chest: Effort normal and breath sounds normal.          Assessment & Plan:  We will add Cymbalta to her regimen to help with the fibromyalgia. Get an A1c today.

## 2013-06-09 ENCOUNTER — Telehealth: Payer: Self-pay

## 2013-06-09 NOTE — Telephone Encounter (Signed)
Relevant patient education mailed to patient.  

## 2013-06-22 ENCOUNTER — Encounter: Payer: Self-pay | Admitting: Vascular Surgery

## 2013-06-23 ENCOUNTER — Ambulatory Visit (INDEPENDENT_AMBULATORY_CARE_PROVIDER_SITE_OTHER): Payer: Medicare Other | Admitting: Vascular Surgery

## 2013-06-23 ENCOUNTER — Ambulatory Visit (HOSPITAL_COMMUNITY)
Admission: RE | Admit: 2013-06-23 | Discharge: 2013-06-23 | Disposition: A | Discharge status: Home / Self Care | Payer: Medicare Other | Source: Ambulatory Visit | Attending: Vascular Surgery | Admitting: Vascular Surgery

## 2013-06-23 ENCOUNTER — Encounter: Payer: Self-pay | Admitting: Vascular Surgery

## 2013-06-23 VITALS — BP 98/64 | HR 109 | Resp 18 | Ht 65.0 in | Wt 216.0 lb

## 2013-06-23 DIAGNOSIS — I6529 Occlusion and stenosis of unspecified carotid artery: Secondary | ICD-10-CM | POA: Insufficient documentation

## 2013-06-23 DIAGNOSIS — Z48812 Encounter for surgical aftercare following surgery on the circulatory system: Secondary | ICD-10-CM

## 2013-06-23 NOTE — Progress Notes (Signed)
Subjective:     Patient ID: Jillian Bright, female   DOB: 1945-11-08, 68 y.o.   MRN: [id number]  HPI this 68 year old female returns 6 months post left carotid endarterectomy for severe asymptomatic left ICA stenosis. She has had no neurologic complications such as lateralizing weakness, aphasia, amaurosis fugax, diplopia, blurred vision, or syncope. She does complain of some mild numbness anterior to her incision which I explained to her is normal. She takes 1 aspirin per day. She has had a recent diagnosis of fibromyalgia and has severe problems with bilateral edema and pain.  Past Medical History  Diagnosis Date  . Diabetes mellitus   . COPD (chronic obstructive pulmonary disease)     sees Dr. [name]  . Sleep apnea, obstructive   . Pulmonary HTN   . Obesity (BMI 30.0-34.9)   . Hyperlipidemia   . Myocardial infarction   . Coronary artery disease   . Anxiety   . Shortness of breath   . GERD (gastroesophageal reflux disease)   . Arthritis     rheumatoid, sees Dr. Tobie Lords    History  Substance Use Topics  . Smoking status: Former Smoker -- 2.00 packs/day for 36 years    Types: Cigarettes    Quit date: 05/28/2009  . Smokeless tobacco: Never Used     Comment: had restarted and quit again 01/27/12  . Alcohol Use: Yes     Comment: few times a year    Family History  Problem Relation Age of Onset  . Hypertension Mother   . Hyperlipidemia Mother   . Stroke Mother   . Heart attack Mother   . Cancer Mother   . Heart attack Father   . Cancer Brother   . Asthma Maternal Grandfather     Allergies  Allergen Reactions  . Codeine     Takes vicodin at home    Current outpatient prescriptions:albuterol (PROVENTIL HFA;VENTOLIN HFA) 108 (90 BASE) MCG/ACT inhaler, Inhale 2 puffs into the lungs every 6 (six) hours as needed for wheezing or shortness of breath., Disp: , Rfl: ;  ALPRAZolam (XANAX) 0.5 MG tablet, Take 1 tablet (0.5 mg total) by mouth 3 (three) times daily as  needed for anxiety., Disp: 90 tablet, Rfl: 5;  aspirin EC 81 MG tablet, Take 1 tablet (81 mg total) by mouth daily., Disp: , Rfl:  atorvastatin (LIPITOR) 20 MG tablet, Take 1 tablet (20 mg total) by mouth daily., Disp: 30 tablet, Rfl: 11;  DULoxetine (CYMBALTA) 30 MG capsule, Take 1 capsule (30 mg total) by mouth daily., Disp: 30 capsule, Rfl: 5;  folic acid (FOLVITE) 1 MG tablet, Take 1 mg by mouth daily., Disp: , Rfl: ;  furosemide (LASIX) 40 MG tablet, Take 40 mg by mouth daily. , Disp: , Rfl:  gabapentin (NEURONTIN) 300 MG capsule, Take 2 capsules (600 mg total) by mouth 3 (three) times daily., Disp: 6 capsule, Rfl: 0;  glipiZIDE (GLUCOTROL) 10 MG tablet, Take 1 tablet (10 mg total) by mouth 2 (two) times daily before a meal., Disp: 60 tablet, Rfl: 11;  glucose blood (ACCU-CHEK AVIVA PLUS) test strip, Test once per day and diagnosis code is 250.00, Disp: 100 each, Rfl: 1 guaiFENesin (MUCINEX) 600 MG 12 hr tablet, Take 1,200 mg by mouth 2 (two) times daily as needed for congestion., Disp: , Rfl: ;  HYDROcodone-acetaminophen (NORCO) 10-325 MG per tablet, TAKE 1 TABLET EVERY 6 HOURS AS NEEDED FOR PAIN, Disp: 60 tablet, Rfl: 5;  meloxicam (MOBIC) 15 MG tablet, Take 1  tablet (15 mg total) by mouth daily., Disp: 30 tablet, Rfl: 11 metFORMIN (GLUCOPHAGE) 1000 MG tablet, Take 1 tablet (1,000 mg total) by mouth 2 (two) times daily with a meal., Disp: 60 tablet, Rfl: 11;  methotrexate (RHEUMATREX) 2.5 MG tablet, Take 2.5 mg by mouth once a week. Caution:Chemotherapy. Protect from light. Take 6 once a week, Disp: , Rfl: ;  omeprazole (PRILOSEC) 40 MG capsule, Take 40 mg by mouth daily before breakfast., Disp: , Rfl:  potassium chloride SA (K-DUR,KLOR-CON) 20 MEQ tablet, Take 20 mEq by mouth 2 (two) times daily., Disp: , Rfl: ;  predniSONE (DELTASONE) 5 MG tablet, Take 1 tablet (5 mg total) by mouth daily with breakfast., Disp: 30 tablet, Rfl: 0;  sitaGLIPtin (JANUVIA) 100 MG tablet, Take 100 mg by mouth daily., Disp:  , Rfl: ;  solifenacin (VESICARE) 5 MG tablet, Take 10 mg by mouth daily., Disp: , Rfl:  spironolactone (ALDACTONE) 25 MG tablet, Take 1 tablet (25 mg total) by mouth daily., Disp: 30 tablet, Rfl: 11  BP 98/64  Pulse 109  Resp 18  Ht 5\' 5"  (1.651 m)  Wt 216 lb (97.977 kg)  BMI 35.94 kg/m2  Body mass index is 35.94 kg/(m^2).          Review of Systems as dyspnea on exertion and is on daily home oxygen. Also has pain in feet while lying flat, bilateral edema, varicose veins, wheezing, weakness and numbness in the legs, and dizziness. Other systems negative complete review of system    Objective:   Physical Exam BP 98/64  Pulse 109  Resp 18  Ht 5\' 5"  (1.651 m)  Wt 216 lb (97.977 kg)  BMI 35.94 kg/m2  Gen.-alert and oriented x3 in no apparent distress HEENT normal for age Lungs no rhonchi or wheezing Cardiovascular regular rhythm no murmurs carotid pulses 3+ palpable no bruits audible Abdomen soft nontender no palpable masses-obese  Musculoskeletal free of  major deformities Skin clear -no rashes Neurologic normal Lower extremities 3+ femoral and dorsalis pedis pulses palpable bilaterally with bilateral 1-2+ edema  Today I ordered carotid duplex exam which I reviewed and interpreted. There is a 50% right ICA stenosis which is stable. Left carotid endarterectomy site is widely patent.        Assessment:     Doing well 6 months post left carotid endarterectomy for severe asymptomatic stenosis with moderate contralateral right ICA stenosis-asymptomatic    Plan:     Return in 6 months for carotid duplex exam and see nurse practitioner Continue daily aspirin

## 2013-06-26 ENCOUNTER — Other Ambulatory Visit: Payer: Self-pay | Admitting: *Deleted

## 2013-06-26 DIAGNOSIS — I6529 Occlusion and stenosis of unspecified carotid artery: Secondary | ICD-10-CM

## 2013-06-30 ENCOUNTER — Telehealth: Payer: Self-pay | Admitting: Family Medicine

## 2013-06-30 NOTE — Telephone Encounter (Signed)
Relevant patient education mailed to patient.  

## 2013-07-02 ENCOUNTER — Encounter: Payer: Self-pay | Admitting: Family Medicine

## 2013-10-20 ENCOUNTER — Other Ambulatory Visit: Payer: Self-pay | Admitting: Family Medicine

## 2013-10-20 ENCOUNTER — Telehealth: Payer: Self-pay | Admitting: Family Medicine

## 2013-10-20 NOTE — Telephone Encounter (Signed)
Pt req rx metFORMIN (GLUCOPHAGE) 1000 MG tablet    [facility] [address]

## 2013-10-20 NOTE — Telephone Encounter (Signed)
I sent script e-scribe. 

## 2013-10-22 ENCOUNTER — Encounter: Payer: Self-pay | Admitting: Family Medicine

## 2013-10-22 ENCOUNTER — Ambulatory Visit (INDEPENDENT_AMBULATORY_CARE_PROVIDER_SITE_OTHER): Payer: Medicare Other | Admitting: Family Medicine

## 2013-10-22 VITALS — BP 114/73 | HR 111 | Temp 98.3°F | Ht 65.0 in | Wt 218.0 lb

## 2013-10-22 DIAGNOSIS — M069 Rheumatoid arthritis, unspecified: Secondary | ICD-10-CM

## 2013-10-22 DIAGNOSIS — M797 Fibromyalgia: Secondary | ICD-10-CM

## 2013-10-22 DIAGNOSIS — J438 Other emphysema: Secondary | ICD-10-CM

## 2013-10-22 DIAGNOSIS — IMO0001 Reserved for inherently not codable concepts without codable children: Secondary | ICD-10-CM

## 2013-10-22 DIAGNOSIS — M1990 Unspecified osteoarthritis, unspecified site: Secondary | ICD-10-CM

## 2013-10-22 DIAGNOSIS — I509 Heart failure, unspecified: Secondary | ICD-10-CM

## 2013-10-22 DIAGNOSIS — I6529 Occlusion and stenosis of unspecified carotid artery: Secondary | ICD-10-CM

## 2013-10-22 DIAGNOSIS — J439 Emphysema, unspecified: Secondary | ICD-10-CM

## 2013-10-22 DIAGNOSIS — I10 Essential (primary) hypertension: Secondary | ICD-10-CM

## 2013-10-22 MED ORDER — GABAPENTIN 300 MG PO CAPS: 300.0000 mg | ORAL_CAPSULE | Freq: Three times a day (TID) | ORAL | Status: DC

## 2013-10-22 MED ORDER — CELECOXIB 200 MG PO CAPS: 200.0000 mg | ORAL_CAPSULE | Freq: Two times a day (BID) | ORAL | Status: DC

## 2013-10-22 MED ORDER — SITAGLIPTIN PHOSPHATE 100 MG PO TABS: 100.0000 mg | ORAL_TABLET | Freq: Every day | ORAL | Status: DC

## 2013-10-22 MED ORDER — DULOXETINE HCL 60 MG PO CPEP: 60.0000 mg | ORAL_CAPSULE | Freq: Every day | ORAL | Status: DC

## 2013-10-22 NOTE — Progress Notes (Signed)
   Subjective:    Patient ID: Jillian Bright, female    DOB: Feb 17, 1946, 68 y.o.   MRN: [id number]  HPI Here asking for advice and for refills. She has both rheumatoid arthritis and degenerative arthritis, and she had been seeing Dr. Tobie Lords for this. He had started her on Methotrexate about 2 months ago but she has not seen much improvement. She is quite frustrated with his office and with her visits with him, and she has decided that she will never go back. She asks if we could manage these issues here instead. She still has generalized pain and stiffness in her joints. She stopped taking Meloxicam. She had increased the dose of her Cymbalta to 60 mg daily.   Review of Systems  Constitutional: Negative.   Respiratory: Negative.   Cardiovascular: Negative.   Musculoskeletal: Positive for arthralgias, joint swelling and myalgias.       Objective:   Physical Exam  Constitutional: She is oriented to person, place, and time. She appears well-developed and well-nourished.  Cardiovascular: Normal rate, regular rhythm, normal heart sounds and intact distal pulses.   Pulmonary/Chest: Effort normal and breath sounds normal.  Neurological: She is alert and oriented to person, place, and time.          Assessment & Plan:  We agreed to manage her joint pains here and that she would not return to Dr. Tonette Bihari office. She will stop the Methotrexate. Try Celebrex 200 mg bid. Stay on Gabapentin at 300 mg tid.

## 2013-10-22 NOTE — Progress Notes (Signed)
Pre visit review using our clinic review tool, if applicable. No additional management support is needed unless otherwise documented below in the visit note. 

## 2013-10-26 ENCOUNTER — Ambulatory Visit (INDEPENDENT_AMBULATORY_CARE_PROVIDER_SITE_OTHER): Payer: Medicare Other | Admitting: Adult Health

## 2013-10-26 ENCOUNTER — Ambulatory Visit (INDEPENDENT_AMBULATORY_CARE_PROVIDER_SITE_OTHER)
Admission: RE | Admit: 2013-10-26 | Discharge: 2013-10-26 | Disposition: A | Discharge status: Home / Self Care | Payer: Medicare Other | Source: Ambulatory Visit | Attending: Adult Health | Admitting: Adult Health

## 2013-10-26 ENCOUNTER — Encounter: Payer: Self-pay | Admitting: Adult Health

## 2013-10-26 VITALS — BP 100/68 | HR 110 | Temp 97.8°F | Ht 66.0 in | Wt 216.2 lb

## 2013-10-26 DIAGNOSIS — J438 Other emphysema: Secondary | ICD-10-CM

## 2013-10-26 DIAGNOSIS — J439 Emphysema, unspecified: Secondary | ICD-10-CM

## 2013-10-26 DIAGNOSIS — I6529 Occlusion and stenosis of unspecified carotid artery: Secondary | ICD-10-CM

## 2013-10-26 MED ORDER — BUDESONIDE-FORMOTEROL FUMARATE 160-4.5 MCG/ACT IN AERO: 2.0000 | INHALATION_SPRAY | Freq: Two times a day (BID) | RESPIRATORY_TRACT | Status: AC

## 2013-10-26 MED ORDER — TIOTROPIUM BROMIDE MONOHYDRATE 18 MCG IN CAPS: 18.0000 ug | ORAL_CAPSULE | Freq: Every day | RESPIRATORY_TRACT | Status: DC

## 2013-10-26 NOTE — Progress Notes (Signed)
   Subjective:    Patient ID: Jillian Bright, female    DOB: [birth year], 68 y.o.   MRN: [id number]  HPI 68 yo former smoker with COPD, and OSA/OHS unable to tolerate CPAP.  Tests: PFT 06/13/09>>FEV1 1.97(76%), FEV1% 73, TLC 5.35(101%), DLCO 65% ABG 06/13/09 >>7.44/50.6/55 on room air  PSG 06/19/09 >>AHI 13  PFT 12/25/11>>FEV1 1.17 (52%), FEV1% 58, TLC 3.67 (70%), DLCO 50%, +BD CT chest 12/31/11>>emphysema, 1.5 cm precarinal node CT cervical spine 01/03/12>>11 x 8 mm LUL nodule Echo 02/02/12>>EF 60 to 40%, grade 1 diastolic dysfx, mild AS, mod RV systolic dysfx, PAS 32 mmHg CT chest 06/30/12 >> 1.3 cm precarinal node no change  10/26/2013 Acute OV  Complains of  head congestion and prod cough with thick clear mucus, some wheezing, sats dropping at home x3 weeks - no discolored mucus. No fever.  Admits she does not always wear her O2 at home. (does not like to wear) - did not wear into office today .  Suppose 3 l/ at rest and 4l/m sleep  Suppose to be CPAP but says she can not wear.  Stopped taking symbicort and Spiriva  Denies f/c/s, hemoptysis, nausea ,vomiting, edema  Review of Systems Constitutional:   No  weight loss, night sweats,  Fevers, chills,  +fatigue, or  lassitude.  HEENT:   No headaches,  Difficulty swallowing,  Tooth/dental problems, or  Sore throat,                No sneezing, itching, ear ache, + nasal congestion, post nasal drip,   CV:  No chest pain,  Orthopnea, PND, swelling in lower extremities, anasarca, dizziness, palpitations, syncope.   GI  No heartburn, indigestion, abdominal pain, nausea, vomiting, diarrhea, change in bowel habits, loss of appetite, bloody stools.   Resp:    No chest wall deformity  Skin: no rash or lesions.  GU: no dysuria, change in color of urine, no urgency or frequency.  No flank pain, no hematuria   MS:  No joint pain or swelling.  No decreased range of motion.  No back pain.  Psych:  No change in mood or affect. No depression  or anxiety.  No memory loss.         Objective:   Physical Exam GEN: A/Ox3; pleasant , NAD, elderly   HEENT:  [place]/AT,  EACs-clear, TMs-wnl, NOSE-clear, THROAT-clear, no lesions, no postnasal drip or exudate noted.   NECK:  Supple w/ fair ROM; no JVD; normal carotid impulses w/o bruits; no thyromegaly or nodules palpated; no lymphadenopathy.  RESP  Diminished BS in bases.no accessory muscle use, no dullness to percussion  CARD:  RRR, no m/r/g  , tr  peripheral edema, pulses intact, no cyanosis or clubbing.  GI:   Soft & nt; nml bowel sounds; no organomegaly or masses detected.  Musco: Warm bil, no deformities or joint swelling noted.   Neuro: alert, no focal deficits noted.    Skin: Warm, no lesions or rashes         Assessment & Plan:

## 2013-10-26 NOTE — Patient Instructions (Signed)
Restart Symbicort 160/4.95mcg 2 puffs Twice daily  , rinse after use.  Restart Spiriva 1 puff daily  Wear oxygen 3l/m daytime , 4l/m at bedtime -at all times.  follow up Dr. Halford Chessman  In 4 weeks and As needed   Please contact office for sooner follow up if symptoms do not improve or worsen or seek emergency care

## 2013-10-26 NOTE — Assessment & Plan Note (Signed)
Uncompensated on present regimen  Advised on medication and O2 compliance .  Check cxr today   Plan  Restart Symbicort 160/4.86mcg 2 puffs Twice daily  , rinse after use.  Restart Spiriva 1 puff daily  Wear oxygen 3l/m daytime , 4l/m at bedtime -at all times.  follow up Dr. Halford Chessman  In 4 weeks and As needed   Please contact office for sooner follow up if symptoms do not improve or worsen or seek emergency care

## 2013-10-26 NOTE — Addendum Note (Signed)
Addended by: Parke Poisson E on: 10/26/2013 04:33 PM   Modules accepted: Orders

## 2013-10-26 NOTE — Addendum Note (Signed)
Addended by: Parke Poisson E on: 10/26/2013 04:59 PM   Modules accepted: Orders

## 2013-10-27 NOTE — Progress Notes (Signed)
Reviewed and agree with assessment/plan. 

## 2013-11-09 ENCOUNTER — Other Ambulatory Visit: Payer: Self-pay | Admitting: Family Medicine

## 2013-12-03 ENCOUNTER — Encounter: Payer: Self-pay | Admitting: Pulmonary Disease

## 2013-12-03 ENCOUNTER — Ambulatory Visit (INDEPENDENT_AMBULATORY_CARE_PROVIDER_SITE_OTHER): Payer: Medicare Other | Admitting: Pulmonary Disease

## 2013-12-03 VITALS — BP 92/60 | HR 112 | Ht 66.0 in | Wt 203.0 lb

## 2013-12-03 DIAGNOSIS — J438 Other emphysema: Secondary | ICD-10-CM

## 2013-12-03 DIAGNOSIS — J432 Centrilobular emphysema: Secondary | ICD-10-CM

## 2013-12-03 DIAGNOSIS — E662 Morbid (severe) obesity with alveolar hypoventilation: Secondary | ICD-10-CM

## 2013-12-03 DIAGNOSIS — I6529 Occlusion and stenosis of unspecified carotid artery: Secondary | ICD-10-CM

## 2013-12-03 NOTE — Patient Instructions (Signed)
Follow up in 6 months 

## 2013-12-03 NOTE — Assessment & Plan Note (Signed)
She is to continue 3 liters oxygen 24/7.  Advised her to d/w her power company about getting form in case her power goes out to ensure she is high priority for returning power.

## 2013-12-03 NOTE — Assessment & Plan Note (Signed)
Recent flare in June 2015 likely related to allergen exposure.  She can use symbicort as needed.  She can stop spiriva.

## 2013-12-03 NOTE — Progress Notes (Signed)
Chief Complaint  Patient presents with  . Follow-up    Pt states that with exertion, she notices her endurance levels have decreased. Pt states that O2 has been dropping while using 3L O2    History of Present Illness: Jillian Bright is a 68 y.o. female former smoker with COPD, and OSA/OHS unable to tolerate CPAP.  She was seen in June by Shannon.  She was having more trouble with allergies.  She was getting cough with thick, clear sputum.  She was started back on spiriva, and symbicort >> these helped.  She has been doing better, and is not using inhalers on daily basis anymore.  She has tried going w/o oxygen.  She feels dizzy and lightheaded when she tries to do this.  Her oxygen today on room air at rest was in mid 80's.  Tests: PFT 06/13/09>>FEV1 1.97(76%), FEV1% 73, TLC 5.35(101%), DLCO 65% ABG 06/13/09 >>7.44/50.6/55 on room air  PSG 06/19/09 >>AHI 13  PFT 12/25/11>>FEV1 1.17 (52%), FEV1% 58, TLC 3.67 (70%), DLCO 50%, +BD CT chest 12/31/11>>emphysema, 1.5 cm precarinal node CT cervical spine 01/03/12>>11 x 8 mm LUL nodule Echo 02/02/12>>EF 60 to 42%, grade 1 diastolic dysfx, mild AS, mod RV systolic dysfx, PAS 32 mmHg CT chest 06/30/12 >> 1.3 cm precarinal node no change Echo 12/02/12 >> EF 55 to 35%, grade 1 diastolic dysfx, mild/mod AS  She  has a past medical history of Diabetes mellitus; COPD (chronic obstructive pulmonary disease); Sleep apnea, obstructive; Pulmonary HTN; Obesity (BMI 30.0-34.9); Hyperlipidemia; Myocardial infarction; Coronary artery disease; Anxiety; Shortness of breath; GERD (gastroesophageal reflux disease); and Arthritis.  She  has past surgical history that includes Appendectomy; Tonsillectomy and adenoidectomy; Lumbar disc surgery; Hemorrhoid surgery; Coronary angioplasty; Endarterectomy (Left, 12/04/2012); and Patch angioplasty (Left, 12/04/2012).  Outpatient Encounter Prescriptions as of 12/03/2013  Medication Sig  . albuterol (PROVENTIL HFA;VENTOLIN HFA)  108 (90 BASE) MCG/ACT inhaler Inhale 2 puffs into the lungs every 6 (six) hours as needed for wheezing or shortness of breath.  . ALPRAZolam (XANAX) 0.5 MG tablet Take 1 tablet (0.5 mg total) by mouth 3 (three) times daily as needed for anxiety.  Marland Kitchen aspirin EC 81 MG tablet Take 1 tablet (81 mg total) by mouth daily.  Marland Kitchen atorvastatin (LIPITOR) 20 MG tablet Take 1 tablet (20 mg total) by mouth daily.  . budesonide-formoterol (SYMBICORT) 160-4.5 MCG/ACT inhaler Inhale 2 puffs into the lungs 2 (two) times daily.  . celecoxib (CELEBREX) 200 MG capsule Take 1 capsule (200 mg total) by mouth 2 (two) times daily.  . DULoxetine (CYMBALTA) 60 MG capsule Take 1 capsule (60 mg total) by mouth daily.  . folic acid (FOLVITE) 1 MG tablet Take 1 mg by mouth daily.  . furosemide (LASIX) 40 MG tablet Take 40 mg by mouth daily.   Marland Kitchen gabapentin (NEURONTIN) 300 MG capsule Take 1 capsule (300 mg total) by mouth 3 (three) times daily.  Marland Kitchen glipiZIDE (GLUCOTROL) 10 MG tablet Take 1 tablet (10 mg total) by mouth 2 (two) times daily before a meal.  . glucose blood (ACCU-CHEK AVIVA PLUS) test strip Test once per day and diagnosis code is 250.00  . HYDROcodone-acetaminophen (NORCO) 10-325 MG per tablet TAKE 1 TABLET EVERY 6 HOURS AS NEEDED FOR PAIN  . metFORMIN (GLUCOPHAGE) 1000 MG tablet TAKE 1 TABLET (1,000 MG TOTAL) BY MOUTH 2 (TWO) TIMES DAILY WITH A MEAL.  Marland Kitchen omeprazole (PRILOSEC) 40 MG capsule Take 40 mg by mouth daily before breakfast.  . sitaGLIPtin (JANUVIA) 100 MG tablet  Take 1 tablet (100 mg total) by mouth daily.  . solifenacin (VESICARE) 5 MG tablet Take 10 mg by mouth daily.  Marland Kitchen spironolactone (ALDACTONE) 25 MG tablet TAKE 1 TABLET (25 MG TOTAL) BY MOUTH DAILY.  Marland Kitchen tiotropium (SPIRIVA HANDIHALER) 18 MCG inhalation capsule Place 1 capsule (18 mcg total) into inhaler and inhale daily.  . [DISCONTINUED] guaiFENesin (MUCINEX) 600 MG 12 hr tablet Take 1,200 mg by mouth 2 (two) times daily as needed for congestion.  .  [DISCONTINUED] potassium chloride SA (K-DUR,KLOR-CON) 20 MEQ tablet Take 20 mEq by mouth 2 (two) times daily.    Allergies  Allergen Reactions  . Codeine     Takes vicodin at home    Physical Exam:  General - No distress ENT - no sinus tenderness, no oral exudate Cardiac - s1s2 regular, 2/6 SM Chest - no wheeze/rales Back - no focal tenderness Abd - soft, non-tender Ext - no edema Neuro - normal strength Skin - no rashes Psych - normal mood, and behavior   Assessment/Plan:  [name], MD [place] [facility] Pager:  [phone] 12/03/2013, 2:29 PM

## 2013-12-21 ENCOUNTER — Encounter: Payer: Self-pay | Admitting: Family Medicine

## 2013-12-21 ENCOUNTER — Ambulatory Visit (INDEPENDENT_AMBULATORY_CARE_PROVIDER_SITE_OTHER): Payer: Medicare Other | Admitting: Family Medicine

## 2013-12-21 ENCOUNTER — Encounter: Payer: Self-pay | Admitting: Vascular Surgery

## 2013-12-21 VITALS — BP 105/60 | HR 122 | Temp 99.0°F | Ht 66.0 in | Wt 203.0 lb

## 2013-12-21 DIAGNOSIS — I2789 Other specified pulmonary heart diseases: Secondary | ICD-10-CM

## 2013-12-21 DIAGNOSIS — I10 Essential (primary) hypertension: Secondary | ICD-10-CM

## 2013-12-21 DIAGNOSIS — I6529 Occlusion and stenosis of unspecified carotid artery: Secondary | ICD-10-CM

## 2013-12-21 DIAGNOSIS — M069 Rheumatoid arthritis, unspecified: Secondary | ICD-10-CM

## 2013-12-21 MED ORDER — HYDROCODONE-ACETAMINOPHEN 10-325 MG PO TABS: ORAL_TABLET | ORAL | Status: DC

## 2013-12-21 NOTE — Progress Notes (Signed)
   Subjective:    Patient ID: Jillian Bright, female    DOB: [birth year], 68 y.o.   MRN: [id number]  HPI Here for diffuse joint pains with swelling and stiffness. She has rheumatoid arthritis and she saw Dr. Ouida Sills for awhile. He tried her on Methotrexate but this did not help much and she did not have a positive experience there, so she decided to not go back. She has been using Vicodin since then with poor response.    Review of Systems  Constitutional: Negative.   Respiratory: Negative.   Cardiovascular: Negative.   Musculoskeletal: Positive for arthralgias and joint swelling.       Objective:   Physical Exam  Constitutional: She appears well-developed and well-nourished.  Cardiovascular: Normal rate, regular rhythm, normal heart sounds and intact distal pulses.   Pulmonary/Chest: Effort normal and breath sounds normal.  Musculoskeletal:  Multiple joints on the hands and wrists are swollen and tender          Assessment & Plan:  We refilled her pain meds, but I think she would be an excellent candidate for a biological medication such as Remicade. We will refer her to Dr. Hassie Bruce who is affiliated with [facility].

## 2013-12-21 NOTE — Progress Notes (Signed)
Pre visit review using our clinic review tool, if applicable. No additional management support is needed unless otherwise documented below in the visit note. 

## 2013-12-22 ENCOUNTER — Ambulatory Visit: Payer: Medicare Other | Admitting: Vascular Surgery

## 2013-12-22 ENCOUNTER — Telehealth: Payer: Self-pay | Admitting: *Deleted

## 2013-12-22 ENCOUNTER — Other Ambulatory Visit (HOSPITAL_COMMUNITY): Payer: Medicare Other

## 2013-12-22 DIAGNOSIS — E119 Type 2 diabetes mellitus without complications: Secondary | ICD-10-CM

## 2013-12-22 DIAGNOSIS — E785 Hyperlipidemia, unspecified: Secondary | ICD-10-CM

## 2013-12-22 NOTE — Telephone Encounter (Signed)
Patient has a lab appointment for DM Lipid, bmet, a1c, micro albumin Diabetic bundle

## 2013-12-23 ENCOUNTER — Telehealth: Payer: Self-pay | Admitting: Family Medicine

## 2013-12-23 DIAGNOSIS — M069 Rheumatoid arthritis, unspecified: Secondary | ICD-10-CM

## 2013-12-23 NOTE — Telephone Encounter (Signed)
Faxed referral and clinical notes to  [facility]- dr Wille Celeste  Address: [address], [place],  [address] [phone] Informed pt of this their office will contact pt to schedule directly

## 2013-12-23 NOTE — Telephone Encounter (Signed)
Please change this

## 2013-12-23 NOTE — Telephone Encounter (Signed)
Dr fry pt has been scheduled and notified of her schedule  Appointment  this is the first available -pt decline she states she is not driving to [place]  and want to be seen elsewhere please advise where you want me to send referral ,I asked pt she states she wants Dr fry to make a suggestion  .  Pt scheduled for 06-24-2014 @8 :41 - [facility]  Address: [facility], [place], [place] [address]  [phone] Faxed notes [phone]

## 2013-12-29 ENCOUNTER — Other Ambulatory Visit (INDEPENDENT_AMBULATORY_CARE_PROVIDER_SITE_OTHER): Payer: Medicare Other

## 2013-12-29 DIAGNOSIS — E119 Type 2 diabetes mellitus without complications: Secondary | ICD-10-CM

## 2013-12-29 DIAGNOSIS — E785 Hyperlipidemia, unspecified: Secondary | ICD-10-CM

## 2013-12-29 LAB — BASIC METABOLIC PANEL
BUN: 11 mg/dL (ref 6–23)
CALCIUM: 8.6 mg/dL (ref 8.4–10.5)
CO2: 33 mEq/L — ABNORMAL HIGH (ref 19–32)
Chloride: 95 mEq/L — ABNORMAL LOW (ref 96–112)
Creatinine, Ser: 1 mg/dL (ref 0.4–1.2)
GFR: 58.98 mL/min — ABNORMAL LOW (ref >60.00)
Glucose, Bld: 111 mg/dL — ABNORMAL HIGH (ref 70–99)
Potassium: 4.1 mEq/L (ref 3.5–5.1)
SODIUM: 139 meq/L (ref 135–145)

## 2013-12-29 LAB — LIPID PANEL
CHOL/HDL RATIO: 6
CHOLESTEROL: 162 mg/dL (ref 0–200)
HDL: 26.8 mg/dL — AB (ref >39.00)
LDL Cholesterol: 109 mg/dL — ABNORMAL HIGH (ref 0–99)
NonHDL: 135.2
TRIGLYCERIDES: 130 mg/dL (ref 0.0–149.0)
VLDL: 26 mg/dL (ref 0.0–40.0)

## 2013-12-29 LAB — HEMOGLOBIN A1C: Hgb A1c MFr Bld: 7.7 % — ABNORMAL HIGH (ref 4.6–6.5)

## 2013-12-29 LAB — MICROALBUMIN / CREATININE URINE RATIO
CREATININE, U: 130.3 mg/dL
MICROALB UR: 1.6 mg/dL (ref 0.0–1.9)
Microalb Creat Ratio: 1.2 mg/g (ref 0.0–30.0)

## 2014-01-16 ENCOUNTER — Other Ambulatory Visit: Payer: Self-pay | Admitting: Family Medicine

## 2014-01-20 ENCOUNTER — Telehealth: Payer: Self-pay | Admitting: Family Medicine

## 2014-01-20 NOTE — Telephone Encounter (Signed)
Refill request for Alprazolam 0.5 mg take 1 po tid prn and send to [facility].

## 2014-01-21 NOTE — Telephone Encounter (Signed)
Call in #[phone] with 5 rf 

## 2014-01-22 MED ORDER — ALPRAZOLAM 0.5 MG PO TABS: 0.5000 mg | ORAL_TABLET | Freq: Three times a day (TID) | ORAL | Status: DC | PRN

## 2014-01-22 NOTE — Telephone Encounter (Signed)
I called in script 

## 2014-01-28 ENCOUNTER — Encounter (HOSPITAL_COMMUNITY): Payer: Self-pay | Admitting: Emergency Medicine

## 2014-01-28 ENCOUNTER — Emergency Department (HOSPITAL_COMMUNITY): Payer: Medicare Other

## 2014-01-28 ENCOUNTER — Inpatient Hospital Stay (HOSPITAL_COMMUNITY)
Admission: EM | Admit: 2014-01-28 | Discharge: 2014-02-01 | DRG: 191 | Disposition: A | Discharge status: Skilled Nursing Facility | Payer: Medicare Other | Attending: Internal Medicine | Admitting: Internal Medicine

## 2014-01-28 DIAGNOSIS — M069 Rheumatoid arthritis, unspecified: Secondary | ICD-10-CM | POA: Diagnosis present

## 2014-01-28 DIAGNOSIS — E662 Morbid (severe) obesity with alveolar hypoventilation: Secondary | ICD-10-CM

## 2014-01-28 DIAGNOSIS — D649 Anemia, unspecified: Secondary | ICD-10-CM

## 2014-01-28 DIAGNOSIS — J439 Emphysema, unspecified: Secondary | ICD-10-CM | POA: Diagnosis present

## 2014-01-28 DIAGNOSIS — Z9181 History of falling: Secondary | ICD-10-CM

## 2014-01-28 DIAGNOSIS — I10 Essential (primary) hypertension: Secondary | ICD-10-CM

## 2014-01-28 DIAGNOSIS — Z87891 Personal history of nicotine dependence: Secondary | ICD-10-CM

## 2014-01-28 DIAGNOSIS — J440 Chronic obstructive pulmonary disease with acute lower respiratory infection: Secondary | ICD-10-CM | POA: Diagnosis not present

## 2014-01-28 DIAGNOSIS — R531 Weakness: Secondary | ICD-10-CM | POA: Diagnosis present

## 2014-01-28 DIAGNOSIS — I4510 Unspecified right bundle-branch block: Secondary | ICD-10-CM | POA: Diagnosis present

## 2014-01-28 DIAGNOSIS — R109 Unspecified abdominal pain: Secondary | ICD-10-CM

## 2014-01-28 DIAGNOSIS — I350 Nonrheumatic aortic (valve) stenosis: Secondary | ICD-10-CM

## 2014-01-28 DIAGNOSIS — I6529 Occlusion and stenosis of unspecified carotid artery: Secondary | ICD-10-CM | POA: Diagnosis present

## 2014-01-28 DIAGNOSIS — J209 Acute bronchitis, unspecified: Principal | ICD-10-CM | POA: Diagnosis present

## 2014-01-28 DIAGNOSIS — E785 Hyperlipidemia, unspecified: Secondary | ICD-10-CM | POA: Diagnosis present

## 2014-01-28 DIAGNOSIS — I2789 Other specified pulmonary heart diseases: Secondary | ICD-10-CM | POA: Diagnosis present

## 2014-01-28 DIAGNOSIS — F411 Generalized anxiety disorder: Secondary | ICD-10-CM | POA: Diagnosis present

## 2014-01-28 DIAGNOSIS — Z7982 Long term (current) use of aspirin: Secondary | ICD-10-CM

## 2014-01-28 DIAGNOSIS — Z6830 Body mass index (BMI) 30.0-30.9, adult: Secondary | ICD-10-CM

## 2014-01-28 DIAGNOSIS — I359 Nonrheumatic aortic valve disorder, unspecified: Secondary | ICD-10-CM | POA: Diagnosis not present

## 2014-01-28 DIAGNOSIS — Z66 Do not resuscitate: Secondary | ICD-10-CM | POA: Diagnosis present

## 2014-01-28 DIAGNOSIS — E872 Acidosis, unspecified: Secondary | ICD-10-CM | POA: Diagnosis present

## 2014-01-28 DIAGNOSIS — Z9981 Dependence on supplemental oxygen: Secondary | ICD-10-CM

## 2014-01-28 DIAGNOSIS — Z823 Family history of stroke: Secondary | ICD-10-CM

## 2014-01-28 DIAGNOSIS — Z9861 Coronary angioplasty status: Secondary | ICD-10-CM

## 2014-01-28 DIAGNOSIS — G4733 Obstructive sleep apnea (adult) (pediatric): Secondary | ICD-10-CM | POA: Diagnosis present

## 2014-01-28 DIAGNOSIS — K219 Gastro-esophageal reflux disease without esophagitis: Secondary | ICD-10-CM | POA: Diagnosis present

## 2014-01-28 DIAGNOSIS — E119 Type 2 diabetes mellitus without complications: Secondary | ICD-10-CM

## 2014-01-28 DIAGNOSIS — M797 Fibromyalgia: Secondary | ICD-10-CM

## 2014-01-28 DIAGNOSIS — I5030 Unspecified diastolic (congestive) heart failure: Secondary | ICD-10-CM | POA: Diagnosis present

## 2014-01-28 DIAGNOSIS — J9819 Other pulmonary collapse: Secondary | ICD-10-CM | POA: Diagnosis present

## 2014-01-28 DIAGNOSIS — R609 Edema, unspecified: Secondary | ICD-10-CM

## 2014-01-28 DIAGNOSIS — Z8249 Family history of ischemic heart disease and other diseases of the circulatory system: Secondary | ICD-10-CM

## 2014-01-28 DIAGNOSIS — I509 Heart failure, unspecified: Secondary | ICD-10-CM

## 2014-01-28 DIAGNOSIS — I252 Old myocardial infarction: Secondary | ICD-10-CM

## 2014-01-28 DIAGNOSIS — I2510 Atherosclerotic heart disease of native coronary artery without angina pectoris: Secondary | ICD-10-CM | POA: Diagnosis present

## 2014-01-28 LAB — I-STAT ARTERIAL BLOOD GAS, ED
Acid-Base Excess: 7 mmol/L — ABNORMAL HIGH (ref 0.0–2.0)
BICARBONATE: 33.5 meq/L — AB (ref 20.0–24.0)
O2 Saturation: 93 %
PCO2 ART: 60.8 mmHg — AB (ref 35.0–45.0)
PO2 ART: 73 mmHg — AB (ref 80.0–100.0)
Patient temperature: 98.6
TCO2: 35 mmol/L (ref 0–100)
pH, Arterial: 7.35 (ref 7.350–7.450)

## 2014-01-28 LAB — PRO B NATRIURETIC PEPTIDE: Pro B Natriuretic peptide (BNP): 1016 pg/mL — ABNORMAL HIGH (ref 0–125)

## 2014-01-28 LAB — COMPREHENSIVE METABOLIC PANEL
ALT: 16 U/L (ref 0–35)
AST: 30 U/L (ref 0–37)
Albumin: 2.7 g/dL — ABNORMAL LOW (ref 3.5–5.2)
Alkaline Phosphatase: 72 U/L (ref 39–117)
Anion gap: 13 (ref 5–15)
BILIRUBIN TOTAL: 0.4 mg/dL (ref 0.3–1.2)
BUN: 21 mg/dL (ref 6–23)
CHLORIDE: 96 meq/L (ref 96–112)
CO2: 29 meq/L (ref 19–32)
CREATININE: 1.11 mg/dL — AB (ref 0.50–1.10)
Calcium: 9 mg/dL (ref 8.4–10.5)
GFR calc Af Amer: 57 mL/min — ABNORMAL LOW (ref >90)
GFR, EST NON AFRICAN AMERICAN: 49 mL/min — AB (ref >90)
Glucose, Bld: 148 mg/dL — ABNORMAL HIGH (ref 70–99)
Potassium: 4.5 mEq/L (ref 3.7–5.3)
Sodium: 138 mEq/L (ref 137–147)
Total Protein: 7.2 g/dL (ref 6.0–8.3)

## 2014-01-28 LAB — CBC
HEMATOCRIT: 32.1 % — AB (ref 36.0–46.0)
Hemoglobin: 9.7 g/dL — ABNORMAL LOW (ref 12.0–15.0)
MCH: 24.7 pg — ABNORMAL LOW (ref 26.0–34.0)
MCHC: 30.2 g/dL (ref 30.0–36.0)
MCV: 81.9 fL (ref 78.0–100.0)
Platelets: 226 10*3/uL (ref 150–400)
RBC: 3.92 MIL/uL (ref 3.87–5.11)
RDW: 16.1 % — ABNORMAL HIGH (ref 11.5–15.5)
WBC: 8.3 10*3/uL (ref 4.0–10.5)

## 2014-01-28 LAB — I-STAT TROPONIN, ED: Troponin i, poc: 0 ng/mL (ref 0.00–0.08)

## 2014-01-28 LAB — POC OCCULT BLOOD, ED: Fecal Occult Bld: NEGATIVE

## 2014-01-28 LAB — D-DIMER, QUANTITATIVE: D-Dimer, Quant: 7.84 ug/mL-FEU — ABNORMAL HIGH (ref 0.00–0.48)

## 2014-01-28 NOTE — ED Provider Notes (Signed)
CSN: [id number]     Arrival date & time 01/28/14  1804 History   First MD Initiated Contact with Patient 01/28/14 1844     Chief Complaint  Patient presents with  . Weakness  . Dizziness     (Consider location/radiation/quality/duration/timing/severity/associated sxs/prior Treatment) HPI Comments: Patient reports feeling weak the last week.  She reports stumbling while walking and fell landing on her butt this morning.  She denies hitting her head.  After the fall.  She reports that "my RA and fibromyalgia was hurting" and said she was unable to get up.  She called a friend, who is unable to get her up either, so EMS was called.  She reports her pain is mostly in her right leg, mainly in her knee and thigh.  She lives alone and reports she has has had more difficulty walking recently; however, she was able to go shopping yesterday. Hx of Carotid endarterectomy ~ 12mo ago.  She denies any rectal bleeding or melena.   Patient is a 68 y.o. female presenting with weakness and dizziness. The history is provided by the patient.  Weakness This is a new problem. The current episode started in the past 7 days. The problem has been gradually worsening. Associated symptoms include arthralgias, fatigue and weakness. Pertinent negatives include no abdominal pain, chest pain, chills, congestion, coughing, fever, nausea, sore throat, urinary symptoms, vertigo or vomiting. The symptoms are aggravated by exertion.  Dizziness Associated symptoms: no blood in stool, no chest pain, no diarrhea, no nausea, no palpitations, no shortness of breath and no vomiting     Past Medical History  Diagnosis Date  . Diabetes mellitus   . COPD (chronic obstructive pulmonary disease)     sees Dr. [name]  . Sleep apnea, obstructive   . Pulmonary HTN   . Obesity (BMI 30.0-34.9)   . Hyperlipidemia   . Myocardial infarction   . Coronary artery disease   . Anxiety   . Shortness of breath   . GERD (gastroesophageal  reflux disease)   . Arthritis     rheumatoid, sees Dr. Tobie Lords   Past Surgical History  Procedure Laterality Date  . Appendectomy    . Tonsillectomy and adenoidectomy    . Lumbar disc surgery    . Hemorrhoid surgery    . Coronary angioplasty    . Endarterectomy Left 12/04/2012    Procedure: ENDARTERECTOMY CAROTID;  Surgeon: Mal Misty, MD;  Location: [facility];  Service: Vascular;  Laterality: Left;  . Patch angioplasty Left 12/04/2012    Procedure: PATCH ANGIOPLASTY;  Surgeon: Mal Misty, MD;  Location: [facility] OR;  Service: Vascular;  Laterality: Left;   Family History  Problem Relation Age of Onset  . Hypertension Mother   . Hyperlipidemia Mother   . Stroke Mother   . Heart attack Mother   . Cancer Mother   . Heart attack Father   . Cancer Brother   . Asthma Maternal Grandfather    History  Substance Use Topics  . Smoking status: Former Smoker -- 2.00 packs/day for 36 years    Types: Cigarettes    Quit date: 05/28/2009  . Smokeless tobacco: Never Used     Comment: had restarted and quit again 01/27/12  . Alcohol Use: Yes     Comment: few times a year   OB History   Grav Para Term Preterm Abortions TAB SAB Ect Mult Living  Review of Systems  Constitutional: Positive for fatigue. Negative for fever and chills.  HENT: Negative for congestion, sneezing and sore throat.   Respiratory: Negative for cough, chest tightness and shortness of breath.   Cardiovascular: Negative for chest pain and palpitations.  Gastrointestinal: Negative for nausea, vomiting, abdominal pain, diarrhea and blood in stool.  Genitourinary: Negative for dysuria, frequency and flank pain.  Musculoskeletal: Positive for arthralgias, back pain and gait problem.  Neurological: Positive for dizziness, weakness and light-headedness. Negative for vertigo, syncope, facial asymmetry and speech difficulty.      Allergies  Codeine  Home Medications   Prior to Admission medications    Medication Sig Start Date End Date Taking? Authorizing Provider  albuterol (PROVENTIL HFA;VENTOLIN HFA) 108 (90 BASE) MCG/ACT inhaler Inhale 2 puffs into the lungs every 6 (six) hours as needed for wheezing or shortness of breath.   Yes Historical Provider, MD  ALPRAZolam Duanne Moron) 0.5 MG tablet Take 1 tablet (0.5 mg total) by mouth 3 (three) times daily as needed for anxiety. 01/22/14  Yes [name], MD  aspirin EC 81 MG tablet Take 1 tablet (81 mg total) by mouth daily. 11/07/11  Yes Larey Dresser, MD  atorvastatin (LIPITOR) 20 MG tablet Take 1 tablet (20 mg total) by mouth daily. 02/18/13  Yes [name], MD  budesonide-formoterol [facility]) 160-4.5 MCG/ACT inhaler Inhale 2 puffs into the lungs 2 (two) times daily. 10/26/13  Yes Tammy S Parrett, NP  celecoxib (CELEBREX) 200 MG capsule Take 1 capsule (200 mg total) by mouth 2 (two) times daily. 10/22/13  Yes [name], MD  DULoxetine (CYMBALTA) 60 MG capsule Take 1 capsule (60 mg total) by mouth daily. 10/22/13  Yes [name], MD  furosemide (LASIX) 40 MG tablet Take 40 mg by mouth daily.  12/27/12  Yes [name], MD  gabapentin (NEURONTIN) 300 MG capsule Take 1 capsule (300 mg total) by mouth 3 (three) times daily. 10/22/13  Yes [name], MD  glipiZIDE (GLUCOTROL) 10 MG tablet Take 1 tablet (10 mg total) by mouth 2 (two) times daily before a meal. 06/01/13  Yes [name], MD  HYDROcodone-acetaminophen (NORCO) 10-325 MG per tablet Take 1 tablet by mouth every 6 (six) hours as needed (pain).   Yes Historical Provider, MD  metFORMIN (GLUCOPHAGE) 1000 MG tablet Take 1,000 mg by mouth 2 (two) times daily with a meal.   Yes Historical Provider, MD  omeprazole (PRILOSEC) 40 MG capsule Take 40 mg by mouth daily before breakfast.   Yes Historical Provider, MD  sitaGLIPtin (JANUVIA) 100 MG tablet Take 1 tablet (100 mg total) by mouth daily. 10/22/13  Yes [name], MD  solifenacin (VESICARE) 5 MG tablet Take 10 mg by mouth daily.   Yes  Historical Provider, MD  spironolactone (ALDACTONE) 25 MG tablet Take 25 mg by mouth daily.   Yes Historical Provider, MD  glucose blood (ACCU-CHEK AVIVA PLUS) test strip Test once per day and diagnosis code is 250.00 11/26/12   [name], MD   BP 104/53  Pulse 97  Temp(Src) 98.4 F (36.9 C) (Oral)  Resp 14  SpO2 98% Physical Exam  Constitutional: She is oriented to person, place, and time. No distress.  Obese  HENT:  Mouth/Throat: Oropharynx is clear and moist.  Eyes: EOM are normal. Pupils are equal, round, and reactive to light.  Neck: Normal range of motion. No JVD present. No thyromegaly present.  Cardiovascular: Normal rate, regular rhythm and  intact distal pulses.   1+ pitting edema bilaterally.  3/6 holosystolic murmur  Pulmonary/Chest: Effort normal. No respiratory distress. She has no wheezes. She has no rales.  Decreased breath sounds bilaterally  Abdominal: Soft. There is no tenderness.  Neurological: She is alert and oriented to person, place, and time. No cranial nerve deficit. Coordination normal.  4/5 strength in bilateral lower extremities- Otherwise, gross sensation and strength intact  Skin: Skin is warm.  Mild erythema overlying right shin    ED Course  Procedures (including critical care time) Labs Review Labs Reviewed  CBC - Abnormal; Notable for the following:    Hemoglobin 9.7 (*)    HCT 32.1 (*)    MCH 24.7 (*)    RDW 16.1 (*)    All other components within normal limits  COMPREHENSIVE METABOLIC PANEL - Abnormal; Notable for the following:    Glucose, Bld 148 (*)    Creatinine, Ser 1.11 (*)    Albumin 2.7 (*)    GFR calc non Af Amer 49 (*)    GFR calc Af Amer 57 (*)    All other components within normal limits  PRO B NATRIURETIC PEPTIDE - Abnormal; Notable for the following:    Pro B Natriuretic peptide (BNP) 1016.0 (*)    All other components within normal limits  D-DIMER, QUANTITATIVE - Abnormal; Notable for the following:    D-Dimer,  Quant 7.84 (*)    All other components within normal limits  I-STAT ARTERIAL BLOOD GAS, ED - Abnormal; Notable for the following:    pCO2 arterial 60.8 (*)    pO2, Arterial 73.0 (*)    Bicarbonate 33.5 (*)    Acid-Base Excess 7.0 (*)    All other components within normal limits  BLOOD GAS, ARTERIAL  I-STAT TROPOININ, ED  POC OCCULT BLOOD, ED    Imaging Review Dg Chest 2 View  01/28/2014   CLINICAL DATA:  Weakness and dizziness.  EXAM: CHEST  2 VIEW  COMPARISON:  10/26/2013  FINDINGS: Heart size upper normal to mildly enlarged. Aortic atherosclerosis and mild tortuosity. Left lung base and lingular opacities. There may be trace left pleural fluid or thickening. No pneumothorax. No acute osseous finding.  IMPRESSION: Mild lingular and left lung base opacity, favor atelectasis or scarring.   Electronically Signed   By: Carlos Levering M.D.   On: 01/28/2014 23:14     EKG Interpretation   Date/Time:  Thursday January 28 2014 18:19:34 EDT Ventricular Rate:  107 PR Interval:  144 QRS Duration: 138 QT Interval:  370 QTC Calculation: 493 R Axis:   134 Text Interpretation:  Sinus rhythm Right bundle branch block No  significant change since last tracing Confirmed by DELOS  MD, DOUGLAS  [phone]) on 01/28/2014 7:58:54 PM      MDM   Final diagnoses:  Weakness  Heart failure, unspecified heart failure chronicity, unspecified heart failure type  Anemia, unspecified anemia type  Aortic stenosis   Weakness  - likely multifactorial due to HF exacerbation due known right heart failure and moderate aortic stenosis with new onset anemia. Patient unable to ambulate while in ED due to weakness.  BNP elevated with LE edema and holosystolic murmur. COPD. D dimer obtained due to initial EKG and tachycardia; CT Angio order to further evaluate for PE.   - Consult to Hospitalist for admission and further evaluation of heart failure and aortic stenosis  - Consult to Cardiology       Olam Idler, MD 01/29/14 [phone]

## 2014-01-28 NOTE — ED Notes (Addendum)
Presents with complaints of dizziness began at 4 am while standing up from a sitting position. Pt described it as "it felt like I was really off balance, I stood and tried to fall back but caught myself. When I am sitting down it fine, my body is just sore and weak." pt alert, orientend, answers all questions appropriately, grips equal bilaterally, no facial droop or arm drift. Pt is hypotensive 89/67

## 2014-01-29 ENCOUNTER — Inpatient Hospital Stay (HOSPITAL_COMMUNITY): Payer: Medicare Other

## 2014-01-29 ENCOUNTER — Encounter (HOSPITAL_COMMUNITY): Payer: Self-pay

## 2014-01-29 DIAGNOSIS — I5030 Unspecified diastolic (congestive) heart failure: Secondary | ICD-10-CM | POA: Diagnosis present

## 2014-01-29 DIAGNOSIS — E785 Hyperlipidemia, unspecified: Secondary | ICD-10-CM | POA: Diagnosis present

## 2014-01-29 DIAGNOSIS — Z7982 Long term (current) use of aspirin: Secondary | ICD-10-CM | POA: Diagnosis not present

## 2014-01-29 DIAGNOSIS — M069 Rheumatoid arthritis, unspecified: Secondary | ICD-10-CM | POA: Diagnosis present

## 2014-01-29 DIAGNOSIS — E119 Type 2 diabetes mellitus without complications: Secondary | ICD-10-CM | POA: Diagnosis present

## 2014-01-29 DIAGNOSIS — D649 Anemia, unspecified: Secondary | ICD-10-CM | POA: Diagnosis present

## 2014-01-29 DIAGNOSIS — Z823 Family history of stroke: Secondary | ICD-10-CM | POA: Diagnosis not present

## 2014-01-29 DIAGNOSIS — I359 Nonrheumatic aortic valve disorder, unspecified: Secondary | ICD-10-CM

## 2014-01-29 DIAGNOSIS — I252 Old myocardial infarction: Secondary | ICD-10-CM | POA: Diagnosis not present

## 2014-01-29 DIAGNOSIS — R5381 Other malaise: Secondary | ICD-10-CM

## 2014-01-29 DIAGNOSIS — Z66 Do not resuscitate: Secondary | ICD-10-CM | POA: Diagnosis present

## 2014-01-29 DIAGNOSIS — R531 Weakness: Secondary | ICD-10-CM | POA: Diagnosis present

## 2014-01-29 DIAGNOSIS — Z9181 History of falling: Secondary | ICD-10-CM | POA: Diagnosis not present

## 2014-01-29 DIAGNOSIS — J449 Chronic obstructive pulmonary disease, unspecified: Secondary | ICD-10-CM

## 2014-01-29 DIAGNOSIS — R5383 Other fatigue: Secondary | ICD-10-CM

## 2014-01-29 DIAGNOSIS — E662 Morbid (severe) obesity with alveolar hypoventilation: Secondary | ICD-10-CM | POA: Diagnosis present

## 2014-01-29 DIAGNOSIS — I279 Pulmonary heart disease, unspecified: Secondary | ICD-10-CM

## 2014-01-29 DIAGNOSIS — I4510 Unspecified right bundle-branch block: Secondary | ICD-10-CM | POA: Diagnosis present

## 2014-01-29 DIAGNOSIS — F411 Generalized anxiety disorder: Secondary | ICD-10-CM | POA: Diagnosis present

## 2014-01-29 DIAGNOSIS — R609 Edema, unspecified: Secondary | ICD-10-CM

## 2014-01-29 DIAGNOSIS — R109 Unspecified abdominal pain: Secondary | ICD-10-CM | POA: Diagnosis present

## 2014-01-29 DIAGNOSIS — Z6830 Body mass index (BMI) 30.0-30.9, adult: Secondary | ICD-10-CM | POA: Diagnosis not present

## 2014-01-29 DIAGNOSIS — E872 Acidosis, unspecified: Secondary | ICD-10-CM | POA: Diagnosis present

## 2014-01-29 DIAGNOSIS — Z9861 Coronary angioplasty status: Secondary | ICD-10-CM | POA: Diagnosis not present

## 2014-01-29 DIAGNOSIS — J9819 Other pulmonary collapse: Secondary | ICD-10-CM | POA: Diagnosis present

## 2014-01-29 DIAGNOSIS — R791 Abnormal coagulation profile: Secondary | ICD-10-CM

## 2014-01-29 DIAGNOSIS — J440 Chronic obstructive pulmonary disease with acute lower respiratory infection: Secondary | ICD-10-CM | POA: Diagnosis present

## 2014-01-29 DIAGNOSIS — Z87891 Personal history of nicotine dependence: Secondary | ICD-10-CM | POA: Diagnosis not present

## 2014-01-29 DIAGNOSIS — Z8249 Family history of ischemic heart disease and other diseases of the circulatory system: Secondary | ICD-10-CM | POA: Diagnosis not present

## 2014-01-29 DIAGNOSIS — I6529 Occlusion and stenosis of unspecified carotid artery: Secondary | ICD-10-CM | POA: Diagnosis present

## 2014-01-29 DIAGNOSIS — I2510 Atherosclerotic heart disease of native coronary artery without angina pectoris: Secondary | ICD-10-CM | POA: Diagnosis present

## 2014-01-29 DIAGNOSIS — G4733 Obstructive sleep apnea (adult) (pediatric): Secondary | ICD-10-CM | POA: Diagnosis present

## 2014-01-29 DIAGNOSIS — I509 Heart failure, unspecified: Secondary | ICD-10-CM | POA: Diagnosis present

## 2014-01-29 DIAGNOSIS — K219 Gastro-esophageal reflux disease without esophagitis: Secondary | ICD-10-CM | POA: Diagnosis present

## 2014-01-29 DIAGNOSIS — I2789 Other specified pulmonary heart diseases: Secondary | ICD-10-CM | POA: Diagnosis present

## 2014-01-29 DIAGNOSIS — Z9981 Dependence on supplemental oxygen: Secondary | ICD-10-CM | POA: Diagnosis not present

## 2014-01-29 LAB — COMPREHENSIVE METABOLIC PANEL
ALT: 15 U/L (ref 0–35)
AST: 25 U/L (ref 0–37)
Albumin: 2.6 g/dL — ABNORMAL LOW (ref 3.5–5.2)
Alkaline Phosphatase: 70 U/L (ref 39–117)
Anion gap: 12 (ref 5–15)
BUN: 18 mg/dL (ref 6–23)
CO2: 31 meq/L (ref 19–32)
CREATININE: 0.89 mg/dL (ref 0.50–1.10)
Calcium: 8.7 mg/dL (ref 8.4–10.5)
Chloride: 95 mEq/L — ABNORMAL LOW (ref 96–112)
GFR calc non Af Amer: 65 mL/min — ABNORMAL LOW (ref >90)
GFR, EST AFRICAN AMERICAN: 75 mL/min — AB (ref >90)
Glucose, Bld: 83 mg/dL (ref 70–99)
Potassium: 4 mEq/L (ref 3.7–5.3)
Sodium: 138 mEq/L (ref 137–147)
Total Bilirubin: 0.3 mg/dL (ref 0.3–1.2)
Total Protein: 6.7 g/dL (ref 6.0–8.3)

## 2014-01-29 LAB — CBC WITH DIFFERENTIAL/PLATELET
Basophils Absolute: 0 10*3/uL (ref 0.0–0.1)
Basophils Relative: 0 % (ref 0–1)
Eosinophils Absolute: 0.3 10*3/uL (ref 0.0–0.7)
Eosinophils Relative: 4 % (ref 0–5)
HCT: 31 % — ABNORMAL LOW (ref 36.0–46.0)
Hemoglobin: 9.4 g/dL — ABNORMAL LOW (ref 12.0–15.0)
LYMPHS ABS: 1.3 10*3/uL (ref 0.7–4.0)
Lymphocytes Relative: 22 % (ref 12–46)
MCH: 24.9 pg — ABNORMAL LOW (ref 26.0–34.0)
MCHC: 30.3 g/dL (ref 30.0–36.0)
MCV: 82 fL (ref 78.0–100.0)
Monocytes Absolute: 0.4 10*3/uL (ref 0.1–1.0)
Monocytes Relative: 6 % (ref 3–12)
NEUTROS ABS: 4.2 10*3/uL (ref 1.7–7.7)
NEUTROS PCT: 68 % (ref 43–77)
PLATELETS: 217 10*3/uL (ref 150–400)
RBC: 3.78 MIL/uL — AB (ref 3.87–5.11)
RDW: 16.1 % — ABNORMAL HIGH (ref 11.5–15.5)
WBC: 6.2 10*3/uL (ref 4.0–10.5)

## 2014-01-29 LAB — RETICULOCYTES
RBC.: 3.81 MIL/uL — AB (ref 3.87–5.11)
RETIC CT PCT: 2.1 % (ref 0.4–3.1)
Retic Count, Absolute: 80 10*3/uL (ref 19.0–186.0)

## 2014-01-29 LAB — GLUCOSE, CAPILLARY
GLUCOSE-CAPILLARY: 209 mg/dL — AB (ref 70–99)
Glucose-Capillary: 86 mg/dL (ref 70–99)
Glucose-Capillary: 117 mg/dL — ABNORMAL HIGH (ref 70–99)
Glucose-Capillary: 104 mg/dL — ABNORMAL HIGH (ref 70–99)
Glucose-Capillary: 146 mg/dL — ABNORMAL HIGH (ref 70–99)

## 2014-01-29 LAB — HEMOGLOBIN A1C
HEMOGLOBIN A1C: 7.2 % — AB (ref <5.7)
Mean Plasma Glucose: 160 mg/dL — ABNORMAL HIGH (ref <117)

## 2014-01-29 LAB — FERRITIN: FERRITIN: 191 ng/mL (ref 10–291)

## 2014-01-29 LAB — MRSA PCR SCREENING: MRSA by PCR: NEGATIVE

## 2014-01-29 LAB — IRON AND TIBC
Iron: 23 ug/dL — ABNORMAL LOW (ref 42–135)
Saturation Ratios: 11 % — ABNORMAL LOW (ref 20–55)
TIBC: 219 ug/dL — ABNORMAL LOW (ref 250–470)
UIBC: 196 ug/dL (ref 125–400)

## 2014-01-29 LAB — TROPONIN I

## 2014-01-29 LAB — VITAMIN B12: VITAMIN B 12: 350 pg/mL (ref 211–911)

## 2014-01-29 LAB — FOLATE: FOLATE: 16.3 ng/mL

## 2014-01-29 LAB — VITAMIN D 25 HYDROXY (VIT D DEFICIENCY, FRACTURES): Vit D, 25-Hydroxy: 18 ng/mL — ABNORMAL LOW (ref 30–89)

## 2014-01-29 LAB — TSH: TSH: 1.87 u[IU]/mL (ref 0.350–4.500)

## 2014-01-29 MED ORDER — FUROSEMIDE 40 MG PO TABS
40.0000 mg | ORAL_TABLET | Freq: Every day | ORAL | Status: DC
  Filled 2014-01-29: qty 1

## 2014-01-29 MED ORDER — DARIFENACIN HYDROBROMIDE ER 15 MG PO TB24
15.0000 mg | ORAL_TABLET | Freq: Every day | ORAL | Status: DC
  Administered 2014-01-29 – 2014-02-01 (×4): 15 mg via ORAL
  Filled 2014-01-29 (×4): qty 1

## 2014-01-29 MED ORDER — ENOXAPARIN SODIUM 100 MG/ML ~~LOC~~ SOLN
1.0000 mg/kg | SUBCUTANEOUS | Status: DC
  Administered 2014-01-29: 90 mg via SUBCUTANEOUS
  Filled 2014-01-29 (×2): qty 1

## 2014-01-29 MED ORDER — CELECOXIB 200 MG PO CAPS
200.0000 mg | ORAL_CAPSULE | Freq: Two times a day (BID) | ORAL | Status: DC
  Administered 2014-01-29 – 2014-02-01 (×7): 200 mg via ORAL
  Filled 2014-01-29 (×8): qty 1

## 2014-01-29 MED ORDER — DIPHENHYDRAMINE HCL 25 MG PO CAPS: 25.0000 mg | ORAL_CAPSULE | Freq: Four times a day (QID) | ORAL | Status: DC | PRN

## 2014-01-29 MED ORDER — CYANOCOBALAMIN 1000 MCG/ML IJ SOLN
1000.0000 ug | Freq: Every day | INTRAMUSCULAR | Status: DC
  Administered 2014-01-29 – 2014-02-01 (×4): 1000 ug via SUBCUTANEOUS
  Filled 2014-01-29 (×4): qty 1

## 2014-01-29 MED ORDER — BUDESONIDE-FORMOTEROL FUMARATE 160-4.5 MCG/ACT IN AERO
2.0000 | INHALATION_SPRAY | Freq: Two times a day (BID) | RESPIRATORY_TRACT | Status: DC
  Administered 2014-01-29 – 2014-02-01 (×7): 2 via RESPIRATORY_TRACT
  Filled 2014-01-29: qty 6

## 2014-01-29 MED ORDER — FUROSEMIDE 10 MG/ML IJ SOLN
40.0000 mg | Freq: Every day | INTRAMUSCULAR | Status: DC
Start: 2014-01-29 — End: 2014-01-29
  Administered 2014-01-29: 40 mg via INTRAVENOUS
  Filled 2014-01-29: qty 4

## 2014-01-29 MED ORDER — ASPIRIN EC 81 MG PO TBEC
81.0000 mg | DELAYED_RELEASE_TABLET | Freq: Every day | ORAL | Status: DC
  Administered 2014-01-29 – 2014-02-01 (×4): 81 mg via ORAL
  Filled 2014-01-29 (×4): qty 1

## 2014-01-29 MED ORDER — IOHEXOL 300 MG/ML  SOLN
25.0000 mL | INTRAMUSCULAR | Status: AC
  Administered 2014-01-29 (×2): 25 mL via ORAL

## 2014-01-29 MED ORDER — FUROSEMIDE 10 MG/ML IJ SOLN: 40.0000 mg | Freq: Two times a day (BID) | INTRAMUSCULAR | Status: DC

## 2014-01-29 MED ORDER — LINAGLIPTIN 5 MG PO TABS
5.0000 mg | ORAL_TABLET | Freq: Every day | ORAL | Status: DC
Start: 2014-01-29 — End: 2014-02-01
  Administered 2014-01-29 – 2014-02-01 (×4): 5 mg via ORAL
  Filled 2014-01-29 (×4): qty 1

## 2014-01-29 MED ORDER — INSULIN ASPART 100 UNIT/ML ~~LOC~~ SOLN
0.0000 [IU] | Freq: Three times a day (TID) | SUBCUTANEOUS | Status: DC
  Administered 2014-01-30: 2 [IU] via SUBCUTANEOUS
  Administered 2014-01-30: 1 [IU] via SUBCUTANEOUS
  Administered 2014-01-30: 2 [IU] via SUBCUTANEOUS
  Administered 2014-01-31: 1 [IU] via SUBCUTANEOUS
  Administered 2014-01-31: 3 [IU] via SUBCUTANEOUS
  Administered 2014-01-31 – 2014-02-01 (×3): 2 [IU] via SUBCUTANEOUS

## 2014-01-29 MED ORDER — SODIUM CHLORIDE 0.9 % IJ SOLN: 3.0000 mL | Freq: Two times a day (BID) | INTRAMUSCULAR | Status: DC

## 2014-01-29 MED ORDER — LEVOFLOXACIN IN D5W 750 MG/150ML IV SOLN
750.0000 mg | INTRAVENOUS | Status: DC
  Administered 2014-01-29: 750 mg via INTRAVENOUS
  Filled 2014-01-29: qty 150

## 2014-01-29 MED ORDER — PANTOPRAZOLE SODIUM 40 MG PO TBEC
80.0000 mg | DELAYED_RELEASE_TABLET | Freq: Every day | ORAL | Status: DC
  Administered 2014-01-29 – 2014-02-01 (×4): 80 mg via ORAL
  Filled 2014-01-29 (×4): qty 2

## 2014-01-29 MED ORDER — ALBUTEROL SULFATE (2.5 MG/3ML) 0.083% IN NEBU: 2.5000 mg | INHALATION_SOLUTION | Freq: Four times a day (QID) | RESPIRATORY_TRACT | Status: DC | PRN

## 2014-01-29 MED ORDER — CEFTRIAXONE SODIUM 1 G IJ SOLR
1.0000 g | INTRAMUSCULAR | Status: DC
  Administered 2014-01-30 – 2014-02-01 (×3): 1 g via INTRAVENOUS
  Filled 2014-01-29 (×3): qty 10

## 2014-01-29 MED ORDER — SPIRONOLACTONE 25 MG PO TABS
25.0000 mg | ORAL_TABLET | Freq: Every day | ORAL | Status: DC
  Administered 2014-01-29: 25 mg via ORAL
  Filled 2014-01-29: qty 1

## 2014-01-29 MED ORDER — INSULIN ASPART 100 UNIT/ML ~~LOC~~ SOLN: 0.0000 [IU] | SUBCUTANEOUS | Status: DC

## 2014-01-29 MED ORDER — SODIUM CHLORIDE 0.9 % IV SOLN: 250.0000 mL | INTRAVENOUS | Status: DC | PRN

## 2014-01-29 MED ORDER — SODIUM CHLORIDE 0.9 % IV SOLN: INTRAVENOUS | Status: DC

## 2014-01-29 MED ORDER — ALBUTEROL SULFATE HFA 108 (90 BASE) MCG/ACT IN AERS: 2.0000 | INHALATION_SPRAY | Freq: Four times a day (QID) | RESPIRATORY_TRACT | Status: DC | PRN

## 2014-01-29 MED ORDER — ALPRAZOLAM 0.5 MG PO TABS: 0.5000 mg | ORAL_TABLET | Freq: Three times a day (TID) | ORAL | Status: DC | PRN

## 2014-01-29 MED ORDER — POLYETHYLENE GLYCOL 3350 17 G PO PACK
17.0000 g | PACK | Freq: Two times a day (BID) | ORAL | Status: DC
  Administered 2014-01-29 – 2014-02-01 (×4): 17 g via ORAL
  Filled 2014-01-29 (×8): qty 1

## 2014-01-29 MED ORDER — IOHEXOL 350 MG/ML SOLN
100.0000 mL | Freq: Once | INTRAVENOUS | Status: AC | PRN
  Administered 2014-01-29: 100 mL via INTRAVENOUS

## 2014-01-29 MED ORDER — SENNOSIDES-DOCUSATE SODIUM 8.6-50 MG PO TABS
1.0000 | ORAL_TABLET | Freq: Two times a day (BID) | ORAL | Status: DC
  Administered 2014-01-31 – 2014-02-01 (×3): 1 via ORAL
  Filled 2014-01-29 (×4): qty 1

## 2014-01-29 MED ORDER — ATORVASTATIN CALCIUM 20 MG PO TABS
20.0000 mg | ORAL_TABLET | Freq: Every day | ORAL | Status: DC
  Administered 2014-01-29 – 2014-02-01 (×4): 20 mg via ORAL
  Filled 2014-01-29 (×4): qty 1

## 2014-01-29 MED ORDER — ALBUTEROL SULFATE (2.5 MG/3ML) 0.083% IN NEBU: 2.5000 mg | INHALATION_SOLUTION | RESPIRATORY_TRACT | Status: DC | PRN

## 2014-01-29 MED ORDER — DULOXETINE HCL 60 MG PO CPEP
60.0000 mg | ORAL_CAPSULE | Freq: Every day | ORAL | Status: DC
  Administered 2014-01-29 – 2014-02-01 (×4): 60 mg via ORAL
  Filled 2014-01-29 (×4): qty 1

## 2014-01-29 MED ORDER — SODIUM CHLORIDE 0.9 % IJ SOLN: 3.0000 mL | INTRAMUSCULAR | Status: DC | PRN

## 2014-01-29 NOTE — ED Notes (Signed)
Spoke with receiving nurse Curlene Dolphin, RN and updated on pt's status.  Pt did not receive CT chest due to CT abd being added and pt will have to drink for 2 hours per CT tech.  Curlene Dolphin, RN voices understandiing

## 2014-01-29 NOTE — Progress Notes (Signed)
Patient seen and examined. See note from Dr. Oval Linsey from 3 AM. Patient complains of weakness. She denies dyspnea or chest pain.  Chest CT showed no pulmonary embolus but there is a nodule and bihilar soft tissue  Infiltration or adenopathy. Further evaluation per primary care. Continue gentle diuresis and follow volume status. Cardiac enzymes are negative. Await followup echocardiogram. Presentation not clearly cardiac. Kirk Ruths

## 2014-01-29 NOTE — H&P (Addendum)
Hospitalist Admission History and Physical  Patient name: Jillian Bright Medical record number: [id number] Date of birth: 23-Jan-1946 Age: 68 y.o. Gender: female  Primary Care Provider: [name], MD  Chief Complaint: weakness   History of Present Illness:This is a 68 y.o. 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 Patient reports progressive weakness over the past 2 or 3 weeks. Patient lives at home alone. Is able to ambulate albeit with assistance at times. Patient states she's had progressive worsening weakness to the point where it is very difficult for pt to ambulate over the past week as well as having recurrent falls. Has felt weak and dizzy with ambulation at times. Denies any chest pain or shortness of breath. No nausea or vomiting. Has had some mild diarrhea. Denies any melanotic or black stools. States she's been compliant with her pulmonary and cardiac regimen. States that she is actually cut back on her Lasix. Has had some mild lower extremity swelling. Has been stable on 3 L at home. Has not increased oxygen demand. Denies any cough or wheezing. No productive sputum.  on initial presentation to the ER, patient was moderately tachycardic and hypoxic into the mid 80s. However patient was not wearing oxygen. Once oxygen was placed tachycardia improved with heart rate into the 90s. Afebrile. Pressures in the 80s to 130s. Mainly in the 100s. Satting greater than 97% on 3 L nasal cannula. Notable labs included hemoglobin of 9.7 (baseline hemoglobin around 11-12), creatinine 1.11, troponin negative x1. ABGs shows compensated respiratory acidosis. Hemoccult negative. Pro BNP of 1000. D-dimer 7.4. EKG w/ sinus rhythm and RBBB-relatively unchanged from most recent  tracing. CXR w/ bibasilar opacities more consistent with atelectasis per report.   Assessment and Plan: Jillian Bright is a 67 y.o. year old female presenting with weakness, anemia,  Active Problems:   Weakness   1- Weakness -Fairly broad differential for symptoms -Likely multifactorial with contributions of baseline  Cardiac and pulmonary disease -Metabolic and hematologic derangements may also be contributing.  -No respiratory complaints currently. Seems to be a relative baseline from a pulmonary standpoint.  -Formal cardiology consult pending  -Repeat 2-D echo (assess AS, diastolic dysfunction progression), cycle cardiac enzymes, risk stratification labs, telemetry bed  -noted LE edema on exam, though lungs clear in setting of pulm HTN and diastolic dysfunction -f/u cards recs as to volume management -may need pRBC transfusion given hgb-type and screen -Noted elevated d-dimer-well score greater than 2-CT angiogram pending-hold off on anticoagulation pending results.-LE u/s  -vitamin d level  -MRI brain r/o intracranial pathology  -medication also consideration-on fair amount of neurotropic medications including norco, neurontin, cymbalta   . 2-Anemia  -Hgb 2 units below baseline -no overt signs of bleeding-hemoccult negative -type and screen  -May benefit from transfusion if there is an element of symptomatic anemia  -hold anticoagulation pending CTA   3-Abd pain  -reproducible generalized abd pain on exam  -check CT abd and pelvis to coorelate    4-CAD/AS/diastolic dysfunction -as discussed above  -pending formal cards c/s -pro BNP seems to be at relative baseline -check weight-trend  -continue home regimen in the interim.   5-COPD  -on 3L at home -no reps distress/increased WOB/wheezing on exam  -compensated resp acidosis on ABG -continue home neb regimen  -pulm c/s as clinically indicated  6-DM -SSI, A1C   FEN/GI: NPO Prophylaxis: SCDs  Disposition:  pending further evaluation  Code Status:DNR    Patient Active Problem List   Diagnosis Date Noted  . Weakness 01/29/2014  . Carotid stenosis 06/23/2013  . Rheumatoid arthritis 06/08/2013  . Fibromyalgia 06/08/2013  . Occlusion and stenosis of carotid artery without mention of cerebral infarction 12/02/2012  . Aortic stenosis 01/14/2012  . CAD (coronary artery disease) 11/07/2011  . Obesity hypoventilation syndrome 06/10/2009  . 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 PULMONARY HYPERTENSION 05/25/2009  . ABDOMINAL PAIN, LOWER 08/04/2008  . DIABETES MELLITUS, TYPE II 08/18/2007  . CONGESTIVE HEART FAILURE 08/18/2007  . Other and Unspecified Hyperlipidemia 08/04/2007  . Generalized anxiety disorder 08/04/2007  . HYPERTENSION 08/04/2007  . GERD 08/04/2007  . DEGENERATIVE JOINT DISEASE 08/04/2007  . DEPENDENT EDEMA 08/04/2007   Past Medical History: Past Medical History  Diagnosis Date  . Diabetes mellitus   . COPD (chronic obstructive pulmonary disease)     sees Dr. [name]  . Sleep apnea, obstructive   . Pulmonary HTN   . Obesity (BMI 30.0-34.9)   . Hyperlipidemia   . Myocardial infarction   . Coronary artery disease   . Anxiety   . Shortness of breath   . GERD (gastroesophageal reflux disease)   . Arthritis     rheumatoid, sees Dr. Tobie Lords    Past Surgical History: Past Surgical History  Procedure Laterality Date  . Appendectomy    . Tonsillectomy and adenoidectomy    . Lumbar disc surgery    . Hemorrhoid surgery    . Coronary angioplasty    . Endarterectomy Left 12/04/2012    Procedure: ENDARTERECTOMY CAROTID;  Surgeon: Mal Misty, MD;  Location: [facility];  Service: Vascular;  Laterality: Left;  . Patch angioplasty Left 12/04/2012    Procedure: PATCH ANGIOPLASTY;  Surgeon: Mal Misty, MD;  Location: [facility] OR;  Service: Vascular;  Laterality: Left;    Social History: History   Social History  . Marital Status: Widowed    Spouse Name: N/A    Number  of Children: N/A  . Years of Education: N/A   Occupational History  . used to work as a Theme park manager    Social History Main Topics  . Smoking status: Former Smoker -- 2.00 packs/day for 36 years    Types: Cigarettes    Quit date: 05/28/2009  . Smokeless tobacco: Never Used     Comment: had restarted and quit again 01/27/12  . Alcohol Use: Yes     Comment: few times a year  . Drug Use: No  . Sexual Activity: None   Other Topics Concern  . None   Social History Narrative   Lives alone    Family History: Family History  Problem Relation Age of Onset  . Hypertension Mother   . Hyperlipidemia Mother   . Stroke Mother   . Heart attack Mother   . Cancer Mother   . Heart attack Father   . Cancer Brother   . Asthma Maternal Grandfather     Allergies: Allergies  Allergen Reactions  . Codeine     Takes vicodin at home    Current Facility-Administered Medications  Medication Dose Route Frequency Provider Last Rate Last Dose  . 0.9 %  sodium chloride infusion  250 mL Intravenous PRN Shanda Howells, MD      . albuterol (PROVENTIL HFA;VENTOLIN HFA) 108 (90 BASE) MCG/ACT inhaler 2 puff  2 puff Inhalation Q6H PRN Shanda Howells,  MD      . budesonide-formoterol (SYMBICORT) 160-4.5 MCG/ACT inhaler 2 puff  2 puff Inhalation BID Shanda Howells, MD      . furosemide (LASIX) tablet 40 mg  40 mg Oral Daily Shanda Howells, MD      . insulin aspart (novoLOG) injection 0-9 Units  0-9 Units Subcutaneous 6 times per day Shanda Howells, MD      . pantoprazole (PROTONIX) EC tablet 80 mg  80 mg Oral Daily Shanda Howells, MD      . sodium chloride 0.9 % injection 3 mL  3 mL Intravenous Q12H Shanda Howells, MD      . sodium chloride 0.9 % injection 3 mL  3 mL Intravenous Q12H Shanda Howells, MD      . sodium chloride 0.9 % injection 3 mL  3 mL Intravenous PRN Shanda Howells, MD      . spironolactone (ALDACTONE) tablet 25 mg  25 mg Oral Daily Shanda Howells, MD       Current Outpatient Prescriptions   Medication Sig Dispense Refill  . albuterol (PROVENTIL HFA;VENTOLIN HFA) 108 (90 BASE) MCG/ACT inhaler Inhale 2 puffs into the lungs every 6 (six) hours as needed for wheezing or shortness of breath.      . ALPRAZolam (XANAX) 0.5 MG tablet Take 1 tablet (0.5 mg total) by mouth 3 (three) times daily as needed for anxiety.  90 tablet  5  . aspirin EC 81 MG tablet Take 1 tablet (81 mg total) by mouth daily.      Marland Kitchen atorvastatin (LIPITOR) 20 MG tablet Take 1 tablet (20 mg total) by mouth daily.  30 tablet  11  . budesonide-formoterol (SYMBICORT) 160-4.5 MCG/ACT inhaler Inhale 2 puffs into the lungs 2 (two) times daily.  1 Inhaler  5  . celecoxib (CELEBREX) 200 MG capsule Take 1 capsule (200 mg total) by mouth 2 (two) times daily.  60 capsule  11  . DULoxetine (CYMBALTA) 60 MG capsule Take 1 capsule (60 mg total) by mouth daily.  30 capsule  11  . furosemide (LASIX) 40 MG tablet Take 40 mg by mouth daily.       Marland Kitchen gabapentin (NEURONTIN) 300 MG capsule Take 1 capsule (300 mg total) by mouth 3 (three) times daily.  90 capsule  5  . glipiZIDE (GLUCOTROL) 10 MG tablet Take 1 tablet (10 mg total) by mouth 2 (two) times daily before a meal.  60 tablet  11  . HYDROcodone-acetaminophen (NORCO) 10-325 MG per tablet Take 1 tablet by mouth every 6 (six) hours as needed (pain).      . metFORMIN (GLUCOPHAGE) 1000 MG tablet Take 1,000 mg by mouth 2 (two) times daily with a meal.      . omeprazole (PRILOSEC) 40 MG capsule Take 40 mg by mouth daily before breakfast.      . sitaGLIPtin (JANUVIA) 100 MG tablet Take 1 tablet (100 mg total) by mouth daily.  30 tablet  11  . solifenacin (VESICARE) 5 MG tablet Take 10 mg by mouth daily.      Marland Kitchen spironolactone (ALDACTONE) 25 MG tablet Take 25 mg by mouth daily.      Marland Kitchen glucose blood (ACCU-CHEK AVIVA PLUS) test strip Test once per day and diagnosis code is 250.00  100 each  1   Review Of Systems: 12 point ROS negative except as noted above in HPI.  Physical Exam: Filed  Vitals:   01/28/14 2345  BP: 104/53  Pulse: 97  Temp:   Resp: 14  General: cooperative and morbidly obese HEENT: PERRLA and extra ocular movement intact Heart: S1, S2 normal, no murmur, rub or gallop, regular rate and rhythm Lungs: clear to auscultation, no wheezes or rales and unlabored breathing Abdomen: obese abdomen, + genralized abd TTP  Extremities: 2+ peripheral pulses, 1-2+ pitting edema bilaterally Skin:no rashes, no ecchymoses Neurology: normal without focal findings  Labs and Imaging: Lab Results  Component Value Date/Time   NA 138 01/28/2014  6:20 PM   K 4.5 01/28/2014  6:20 PM   CL 96 01/28/2014  6:20 PM   CO2 29 01/28/2014  6:20 PM   BUN 21 01/28/2014  6:20 PM   CREATININE 1.11* 01/28/2014  6:20 PM   GLUCOSE 148* 01/28/2014  6:20 PM   Lab Results  Component Value Date   WBC 8.3 01/28/2014   HGB 9.7* 01/28/2014   HCT 32.1* 01/28/2014   MCV 81.9 01/28/2014   PLT 226 01/28/2014    Dg Chest 2 View  01/28/2014   CLINICAL DATA:  Weakness and dizziness.  EXAM: CHEST  2 VIEW  COMPARISON:  10/26/2013  FINDINGS: Heart size upper normal to mildly enlarged. Aortic atherosclerosis and mild tortuosity. Left lung base and lingular opacities. There may be trace left pleural fluid or thickening. No pneumothorax. No acute osseous finding.  IMPRESSION: Mild lingular and left lung base opacity, favor atelectasis or scarring.   Electronically Signed   By: Carlos Levering M.D.   On: 01/28/2014 23:14           Shanda Howells MD  Pager: [phone]

## 2014-01-29 NOTE — Progress Notes (Signed)
*  PRELIMINARY RESULTS* Echocardiogram 2D Echocardiogram has been performed.  [name] 01/29/2014, 11:51 AM

## 2014-01-29 NOTE — Progress Notes (Signed)
[facility] TEAM 1 - Stepdown/ICU TEAM Progress Note  Jillian Bright MVV:[id number] DOB: November 22, 1945 DOA: 01/28/2014 PCP: [name], MD  Admit HPI / Brief Narrative: 68 year old female with past medical history including severe COPD on 3 L nasal cannula at home, coronary artery disease, grade 1 diastolic dysfunction, mild aortic stenosis, pulmonary hypertension, and NIDDM who presented with progressive weakness over 2 or 3 weeks to the point where it was very difficult to ambulate.  She was having recurrent falls. Denied chest pain or shortness of breath. No nausea or vomiting. Had some mild diarrhea. Denies any melanotic or black stools. Did report some orthostatic sx / positional dizziness.  On presentation to the ER, patient was tachycardic and hypoxic into the mid 80s, but the patient was not wearing oxygen. Once oxygen was placed tachycardia improved with heart rate into the 90s.  Blood pressure was mainly in the 100s.  Notable labs included hemoglobin of 9.7 (baseline hemoglobin around 11-12), creatinine 1.11, troponin negative x1. ABG showed compensated respiratory acidosis. Hemoccult negative. Pro BNP of 1000. D-dimer 7.4. EKG w/ sinus rhythm and RBBB-relatively unchanged from most recent tracing. CXR w/ bibasilar opacities more consistent with atelectasis per report.   HPI/Subjective: Pt seen for f/u visit.  Assessment/Plan:  Generalized weakness  COPD on chronic O2 tx w/ ?acute on chronic bronchitis  CT also notes "bihilar infiltrative soft tissue or adenopathy" as well as a lingular consolidation - f/u CT in 6 weeks is suggested   1cm LUL pulmonary nodule   Anemia   Abdom pain Distended GB noted, but o/w no acute findings apart from moderate constipation   CAD   AoS  Diastolic CHF   DM  Code Status: NO CODE Family Communication: no family present at time of exam Disposition Plan: stable for transfer to med bed   Consultants: Cardiology   Procedures: TTE - 9/4 -  pending   Antibiotics: none  DVT prophylaxis: SCDs  Objective: Blood pressure 95/53, pulse 99, temperature 97.2 F (36.2 C), temperature source Axillary, resp. rate 15, height 5\' 6"  (1.676 m), weight 90.7 kg (199 lb 15.3 oz), SpO2 99.00%. No intake or output data in the 24 hours ending 01/29/14 1142  Exam: F/U exam completed  Data Reviewed: Basic Metabolic Panel:  Recent Labs Lab 01/28/14 1820 01/29/14 0257  NA 138 138  K 4.5 4.0  CL 96 95*  CO2 29 31  GLUCOSE 148* 83  BUN 21 18  CREATININE 1.11* 0.89  CALCIUM 9.0 8.7   Liver Function Tests:  Recent Labs Lab 01/28/14 1820 01/29/14 0257  AST 30 25  ALT 16 15  ALKPHOS 72 70  BILITOT 0.4 0.3  PROT 7.2 6.7  ALBUMIN 2.7* 2.6*   CBC:  Recent Labs Lab 01/28/14 1820 01/29/14 0257  WBC 8.3 6.2  NEUTROABS  --  4.2  HGB 9.7* 9.4*  HCT 32.1* 31.0*  MCV 81.9 82.0  PLT 226 217    Cardiac Enzymes:  Recent Labs Lab 01/29/14 0257 01/29/14 0631  TROPONINI <0.30 <0.30   BNP (last 3 results)  Recent Labs  01/28/14 2215  PROBNP 1016.0*    CBG:  Recent Labs Lab 01/29/14 0350 01/29/14 0753  GLUCAP 86 117*    Recent Results (from the past 240 hour(s))  MRSA PCR SCREENING     Status: None   Collection Time    01/29/14  2:23 AM      Result Value Ref Range Status   MRSA by PCR NEGATIVE  NEGATIVE Final   Comment:            The GeneXpert MRSA Assay (FDA     approved for NASAL specimens     only), is one component of a     comprehensive MRSA colonization     surveillance program. It is not     intended to diagnose MRSA     infection nor to guide or     monitor treatment for     MRSA infections.    Studies:  Recent x-ray studies have been reviewed in detail by the Attending Physician  Scheduled Meds:  Scheduled Meds: . budesonide-formoterol  2 puff Inhalation BID  . furosemide  40 mg Intravenous BID  . insulin aspart  0-9 Units Subcutaneous 6 times per day  . pantoprazole  80 mg Oral  Daily  . sodium chloride  3 mL Intravenous Q12H  . sodium chloride  3 mL Intravenous Q12H  . spironolactone  25 mg Oral Daily    Time spent on care of this patient: 25+ mins   MCCLUNG,JEFFREY T , MD   [facility] Office  [phone] Pager - Text Page per Shea Evans as per below:  On-Call/Text Page:      [email or web address]      password [email or web address]  If 7PM-7AM, please contact night-coverage [email or web address] Password [email or web address] 01/29/2014, 11:42 AM   LOS: 1 day

## 2014-01-29 NOTE — Progress Notes (Signed)
Utilization review completed. Demontay Grantham, RN, BSN. 

## 2014-01-29 NOTE — Consult Note (Signed)
Referring Physician: Shanda Howells, MD Primary Physician: Dr. Alysia Penna Primary Cardiologist: Loralie Champagne, MD Reason for Consultation: R heart failure, AS   HPI: Jillian Bright is a 89F with R heart failure (improved on echo 12/02/12), pulmonary HTN, COPD on home O2, CAD s/p PCI,  DM, HL, OSA unable to tolerate CPAP and RA here with generalized weakness.  Patient reports several weeks of progressive weakness.  This evening she fell after standing up from a chair.  She denies preceding CP, SOB, palpitations, lightheadedness or dizziness.  She felt generally weak and lost her balance.  She did not lose consciousness and did not hit her head.  However, she was unable to get up from the floor.  When a friend arrived, they called EMS, so she was brought to the ED.  [name] denies fever, chills, cough, or dysuria.  She also denies long car rides.  She reports stable, bilateral LE edema.  At the time of this interview [name] was very sleepy and unable to give a full history.   Review of Systems:     Cardiac Review of Systems: {Y] = yes [ ]  = no  Chest Pain [    ]  Resting SOB [   ] Exertional SOB  [ Y ]  Orthopnea [  ]   Pedal Edema [ Y  ]    Palpitations [  ] Syncope  [  ]   Presyncope [   ]  General Review of Systems: [Y] = yes [  ]=no Constitional: recent weight change [Y ]; anorexia [ Y]; fatigue [  ]; nausea [  ]; night sweats [  ]; fever [  ]; or chills [  ];                                                                     Eyes : blurred vision [  ]; diplopia [   ]; vision changes [  ];  Amaurosis fugax[  ]; Resp: cough [  ];  wheezing[  ];  hemoptysis[  ];  PND [  ];  GI:  gallstones[  ], vomiting[  ];  dysphagia[  ]; melena[  ];  hematochezia [  ]; heartburn[  ];   GU: kidney stones [  ]; hematuria[  ];   dysuria [  ];  nocturia[  ]; incontinence [  ];             Skin: rash, swelling[  ];, hair loss[  ];  peripheral edema[  ];  or itching[  ]; Musculosketetal: myalgias[  ];   joint swelling[Y  ];  joint erythema[  ];  joint pain[Y  ];  back pain[  Y];  Heme/Lymph: bruising[  ];  bleeding[  ];  anemia[  ];  Neuro: TIA[  ];  headaches[  ];  stroke[  ];  vertigo[  ];  seizures[  ];   paresthesias[  ];  difficulty walking[  ];  Psych:depression[  ]; anxiety[  ];  Endocrine: diabetes[  ];  thyroid dysfunction[  ];  Other:  Past Medical History  Diagnosis Date  . Diabetes mellitus   . COPD (chronic obstructive pulmonary disease)     sees Dr. [name]  .  Sleep apnea, obstructive   . Pulmonary HTN   . Obesity (BMI 30.0-34.9)   . Hyperlipidemia   . Myocardial infarction   . Coronary artery disease   . Anxiety   . Shortness of breath   . GERD (gastroesophageal reflux disease)   . Arthritis     rheumatoid, sees Dr. Tobie Lords    Medications Prior to Admission  Medication Sig Dispense Refill  . albuterol (PROVENTIL HFA;VENTOLIN HFA) 108 (90 BASE) MCG/ACT inhaler Inhale 2 puffs into the lungs every 6 (six) hours as needed for wheezing or shortness of breath.      . ALPRAZolam (XANAX) 0.5 MG tablet Take 1 tablet (0.5 mg total) by mouth 3 (three) times daily as needed for anxiety.  90 tablet  5  . aspirin EC 81 MG tablet Take 1 tablet (81 mg total) by mouth daily.      Marland Kitchen atorvastatin (LIPITOR) 20 MG tablet Take 1 tablet (20 mg total) by mouth daily.  30 tablet  11  . budesonide-formoterol (SYMBICORT) 160-4.5 MCG/ACT inhaler Inhale 2 puffs into the lungs 2 (two) times daily.  1 Inhaler  5  . celecoxib (CELEBREX) 200 MG capsule Take 1 capsule (200 mg total) by mouth 2 (two) times daily.  60 capsule  11  . DULoxetine (CYMBALTA) 60 MG capsule Take 1 capsule (60 mg total) by mouth daily.  30 capsule  11  . furosemide (LASIX) 40 MG tablet Take 40 mg by mouth daily.       Marland Kitchen gabapentin (NEURONTIN) 300 MG capsule Take 1 capsule (300 mg total) by mouth 3 (three) times daily.  90 capsule  5  . glipiZIDE (GLUCOTROL) 10 MG tablet Take 1 tablet (10 mg total) by mouth 2  (two) times daily before a meal.  60 tablet  11  . HYDROcodone-acetaminophen (NORCO) 10-325 MG per tablet Take 1 tablet by mouth every 6 (six) hours as needed (pain).      . metFORMIN (GLUCOPHAGE) 1000 MG tablet Take 1,000 mg by mouth 2 (two) times daily with a meal.      . omeprazole (PRILOSEC) 40 MG capsule Take 40 mg by mouth daily before breakfast.      . sitaGLIPtin (JANUVIA) 100 MG tablet Take 1 tablet (100 mg total) by mouth daily.  30 tablet  11  . solifenacin (VESICARE) 5 MG tablet Take 10 mg by mouth daily.      Marland Kitchen spironolactone (ALDACTONE) 25 MG tablet Take 25 mg by mouth daily.      Marland Kitchen glucose blood (ACCU-CHEK AVIVA PLUS) test strip Test once per day and diagnosis code is 250.00  100 each  1     . budesonide-formoterol  2 puff Inhalation BID  . furosemide  40 mg Oral Daily  . insulin aspart  0-9 Units Subcutaneous 6 times per day  . iohexol  25 mL Oral Q1 Hr x 2  . pantoprazole  80 mg Oral Daily  . sodium chloride  3 mL Intravenous Q12H  . sodium chloride  3 mL Intravenous Q12H  . spironolactone  25 mg Oral Daily    Infusions:    Allergies  Allergen Reactions  . Codeine     Takes vicodin at home    History   Social History  . Marital Status: Widowed    Spouse Name: N/A    Number of Children: N/A  . Years of Education: N/A   Occupational History  . used to work as a Hydrologist  History Main Topics  . Smoking status: Former Smoker -- 2.00 packs/day for 36 years    Types: Cigarettes    Quit date: 05/28/2009  . Smokeless tobacco: Never Used     Comment: had restarted and quit again 01/27/12  . Alcohol Use: Yes     Comment: few times a year  . Drug Use: No  . Sexual Activity: Not on file   Other Topics Concern  . Not on file   Social History Narrative   Lives alone    Family History  Problem Relation Age of Onset  . Hypertension Mother   . Hyperlipidemia Mother   . Stroke Mother   . Heart attack Mother   . Cancer Mother   . Heart attack  Father   . Cancer Brother   . Asthma Maternal Grandfather     PHYSICAL EXAM: Filed Vitals:   01/29/14 0045  BP: 105/53  Pulse: 97  Temp: 98 F (36.7 C)  Resp: 13    No intake or output data in the 24 hours ending 01/29/14 0304  General:  Chronically ill-appearing woman, sleepint at 45 degrees in NAD.  Wearing nasal cannula O2. HEENT: normal Neck: supple. JVP just below earlobe at 45 degrees.  +HJR.   Carotids 2+ bilat; no bruits. No lymphadenopathy or thryomegaly appreciated. Cor:  Regular rate & rhythm. III/VI mid-peaking systolic murmur at RUSB.  No rubs or murmurs. Lungs: clear Abdomen: Central adiposity.  soft, nontender. No hepatosplenomegaly. No bruits or masses. Good bowel sounds. Extremities: no cyanosis, clubbing, rash.  1+ bilateral LE edema to upper tibia.  Associated skin changes of chronic edema. Neuro: alert & oriented x 3.   moves all 4 extremities w/o difficulty. Affect pleasant.  ECG: Sinus tach at 107bpm.  RBBB.  Grossly unchanged from 12/06/12.  Results for orders placed during the hospital encounter of 01/28/14 (from the past 24 hour(s))  CBC     Status: Abnormal   Collection Time    01/28/14  6:20 PM      Result Value Ref Range   WBC 8.3  4.0 - 10.5 K/uL   RBC 3.92  3.87 - 5.11 MIL/uL   Hemoglobin 9.7 (*) 12.0 - 15.0 g/dL   HCT 32.1 (*) 36.0 - 46.0 %   MCV 81.9  78.0 - 100.0 fL   MCH 24.7 (*) 26.0 - 34.0 pg   MCHC 30.2  30.0 - 36.0 g/dL   RDW 16.1 (*) 11.5 - 15.5 %   Platelets 226  150 - 400 K/uL  COMPREHENSIVE METABOLIC PANEL     Status: Abnormal   Collection Time    01/28/14  6:20 PM      Result Value Ref Range   Sodium 138  137 - 147 mEq/L   Potassium 4.5  3.7 - 5.3 mEq/L   Chloride 96  96 - 112 mEq/L   CO2 29  19 - 32 mEq/L   Glucose, Bld 148 (*) 70 - 99 mg/dL   BUN 21  6 - 23 mg/dL   Creatinine, Ser 1.11 (*) 0.50 - 1.10 mg/dL   Calcium 9.0  8.4 - 10.5 mg/dL   Total Protein 7.2  6.0 - 8.3 g/dL   Albumin 2.7 (*) 3.5 - 5.2 g/dL   AST 30  0  - 37 U/L   ALT 16  0 - 35 U/L   Alkaline Phosphatase 72  39 - 117 U/L   Total Bilirubin 0.4  0.3 - 1.2 mg/dL   GFR calc  non Af Amer 49 (*) >90 mL/min   GFR calc Af Amer 57 (*) >90 mL/min   Anion gap 13  5 - 15  I-STAT TROPOININ, ED     Status: None   Collection Time    01/28/14  6:39 PM      Result Value Ref Range   Troponin i, poc 0.00  0.00 - 0.08 ng/mL   Comment 3           I-STAT ARTERIAL BLOOD GAS, ED     Status: Abnormal   Collection Time    01/28/14  8:33 PM      Result Value Ref Range   pH, Arterial 7.350  7.350 - 7.450   pCO2 arterial 60.8 (*) 35.0 - 45.0 mmHg   pO2, Arterial 73.0 (*) 80.0 - 100.0 mmHg   Bicarbonate 33.5 (*) 20.0 - 24.0 mEq/L   TCO2 35  0 - 100 mmol/L   O2 Saturation 93.0     Acid-Base Excess 7.0 (*) 0.0 - 2.0 mmol/L   Patient temperature 98.6 F     Collection site RADIAL, ALLEN'S TEST ACCEPTABLE     Drawn by RT     Sample type ARTERIAL    POC OCCULT BLOOD, ED     Status: None   Collection Time    01/28/14  9:26 PM      Result Value Ref Range   Fecal Occult Bld NEGATIVE  NEGATIVE  PRO B NATRIURETIC PEPTIDE     Status: Abnormal   Collection Time    01/28/14 10:15 PM      Result Value Ref Range   Pro B Natriuretic peptide (BNP) 1016.0 (*) 0 - 125 pg/mL  D-DIMER, QUANTITATIVE     Status: Abnormal   Collection Time    01/28/14 10:38 PM      Result Value Ref Range   D-Dimer, Quant 7.84 (*) 0.00 - 0.48 ug/mL-FEU   Dg Chest 2 View  01/28/2014   CLINICAL DATA:  Weakness and dizziness.  EXAM: CHEST  2 VIEW  COMPARISON:  10/26/2013  FINDINGS: Heart size upper normal to mildly enlarged. Aortic atherosclerosis and mild tortuosity. Left lung base and lingular opacities. There may be trace left pleural fluid or thickening. No pneumothorax. No acute osseous finding.  IMPRESSION: Mild lingular and left lung base opacity, favor atelectasis or scarring.   Electronically Signed   By: Carlos Levering M.D.   On: 01/28/2014 23:14   12/02/12 TTE: EF 55-60%, grade 1  diastolic dysfunction.  Moderate AS (mean gradient 28mmHg).  Normal RV function.  No other significant valvular disease.  ASSESSMENT:  62F with R heart failure (improved on echo 12/02/12), pulmonary HTN, COPD on home O2, CAD s/p PCI,  DM, HL, OSA unable to tolerate CPAP and RA here with generalized weakness.  It is unclear what is causing Ms. Habeeb's weakness.  She has multiple comorbidities which are likely contributing.  Here severe pulmonary disease and RA could certainly be contributing.  Given that her D-dimer is elevated, her LE edema that and she has mostly R-sided symptoms on exam, it is reasonable to assess for PE, though my suspicion is that she does not have a PE.  She had R heart failure several years ago which improved on her echo 11/2012.  However, it is quite likely that her RV may be failing again.  She has severe COPD and untreated OSA, which likely exacerbates her pulmonary HTN.   PLAN/DISCUSSION: - Agree with repeating echo and obtaining CT-A  to r/o PE - Lasix 40mg  from PO to IV given volume overload - Diurese with a goal -1L daily - Daily weights - Strict Ins and Outs - Suspect she is not on a beta blocker due to COPD - Given her history of CAD, would caution against the use of celecoxib and other NSAIDS - check U/A and urine culture - Cycle cardiac enzymes

## 2014-01-29 NOTE — ED Notes (Signed)
Dr. Ernestina Patches in to assess pt for possible admission

## 2014-01-29 NOTE — ED Provider Notes (Signed)
I saw and evaluated the patient, reviewed the resident's note and I agree with the findings and plan. Patient is a 68 year old female who presents with complaints of feeling weak and dizzy for the past 7 days. She has extensive past medical history including aortic stenosis, carotid endarterectomy, diabetes, COPD, and pulmonary hypertension. She states she has been so weak today that she has been having difficulty walking and actually fell to the floor. She denies that she injured herself.  On exam, vitals are stable and she is afebrile. Head is atraumatic, normocephalic. Neck is supple. Heart is regular rate and rhythm and lungs are clear. Abdomen is soft, nontender, nondistended. Extremities without edema. Neurologically, cranial nerves are intact. There is a diffuse muscle weakness present in all 4 extremities.  Workup reveals a mild anemia, elevated d-dimer, mildly elevated BNP. The patient's care is been discussed with Dr. Ernestina Patches from the hospitalist service who will value with patient in the ER. He is recommending imaging of the chest to rule out pulmonary embolism and is also assess to speak with cardiology which we will do. The patient will ultimately be admitted to the hospitalist service.   EKG Interpretation   Date/Time:  Thursday January 28 2014 18:19:34 EDT Ventricular Rate:  107 PR Interval:  144 QRS Duration: 138 QT Interval:  370 QTC Calculation: 493 R Axis:   134 Text Interpretation:  Sinus rhythm Right bundle branch block No  significant change since last tracing Confirmed by Beau Fanny  MD, Shonica Weier  ([id number]) on 01/28/2014 7:58:54 PM        Veryl Speak, MD 01/29/14 0104

## 2014-01-29 NOTE — Progress Notes (Addendum)
*  Preliminary Results* Bilateral lower extremity venous duplex completed. Bilateral lower extremities are negative for deep vein thrombosis. There is evidence of Baker's cyst bilaterally.  01/29/2014  Maudry Mayhew, RVT, RDCS, RDMS

## 2014-01-30 DIAGNOSIS — I509 Heart failure, unspecified: Secondary | ICD-10-CM

## 2014-01-30 DIAGNOSIS — I359 Nonrheumatic aortic valve disorder, unspecified: Secondary | ICD-10-CM

## 2014-01-30 LAB — GLUCOSE, CAPILLARY
Glucose-Capillary: 170 mg/dL — ABNORMAL HIGH (ref 70–99)
Glucose-Capillary: 172 mg/dL — ABNORMAL HIGH (ref 70–99)
Glucose-Capillary: 123 mg/dL — ABNORMAL HIGH (ref 70–99)
Glucose-Capillary: 136 mg/dL — ABNORMAL HIGH (ref 70–99)

## 2014-01-30 LAB — BASIC METABOLIC PANEL
ANION GAP: 12 (ref 5–15)
BUN: 11 mg/dL (ref 6–23)
CO2: 33 meq/L — AB (ref 19–32)
Calcium: 8.9 mg/dL (ref 8.4–10.5)
Chloride: 92 mEq/L — ABNORMAL LOW (ref 96–112)
Creatinine, Ser: 0.8 mg/dL (ref 0.50–1.10)
GFR calc Af Amer: 85 mL/min — ABNORMAL LOW (ref >90)
GFR, EST NON AFRICAN AMERICAN: 74 mL/min — AB (ref >90)
Glucose, Bld: 119 mg/dL — ABNORMAL HIGH (ref 70–99)
Potassium: 4.6 mEq/L (ref 3.7–5.3)
SODIUM: 137 meq/L (ref 137–147)

## 2014-01-30 LAB — URINALYSIS, ROUTINE W REFLEX MICROSCOPIC
BILIRUBIN URINE: NEGATIVE
Glucose, UA: NEGATIVE mg/dL
Hgb urine dipstick: NEGATIVE
KETONES UR: NEGATIVE mg/dL
Leukocytes, UA: NEGATIVE
NITRITE: NEGATIVE
PH: 7.5 (ref 5.0–8.0)
PROTEIN: NEGATIVE mg/dL
Specific Gravity, Urine: 1.004 — ABNORMAL LOW (ref 1.005–1.030)
Urobilinogen, UA: 0.2 mg/dL (ref 0.0–1.0)

## 2014-01-30 LAB — CBC
HCT: 32.5 % — ABNORMAL LOW (ref 36.0–46.0)
Hemoglobin: 9.8 g/dL — ABNORMAL LOW (ref 12.0–15.0)
MCH: 24.6 pg — AB (ref 26.0–34.0)
MCHC: 30.2 g/dL (ref 30.0–36.0)
MCV: 81.5 fL (ref 78.0–100.0)
PLATELETS: 228 10*3/uL (ref 150–400)
RBC: 3.99 MIL/uL (ref 3.87–5.11)
RDW: 16 % — ABNORMAL HIGH (ref 11.5–15.5)
WBC: 6.9 10*3/uL (ref 4.0–10.5)

## 2014-01-30 MED ORDER — ENOXAPARIN SODIUM 40 MG/0.4ML ~~LOC~~ SOLN
40.0000 mg | SUBCUTANEOUS | Status: DC
  Administered 2014-01-30 – 2014-01-31 (×2): 40 mg via SUBCUTANEOUS
  Filled 2014-01-30 (×3): qty 0.4

## 2014-01-30 MED ORDER — FUROSEMIDE 10 MG/ML IJ SOLN
40.0000 mg | Freq: Every day | INTRAMUSCULAR | Status: DC
  Administered 2014-01-30: 40 mg via INTRAVENOUS
  Filled 2014-01-30: qty 4

## 2014-01-30 MED ORDER — GABAPENTIN 300 MG PO CAPS
300.0000 mg | ORAL_CAPSULE | Freq: Three times a day (TID) | ORAL | Status: DC
  Administered 2014-01-30 – 2014-02-01 (×7): 300 mg via ORAL
  Filled 2014-01-30 (×8): qty 1

## 2014-01-30 MED ORDER — HYDROCODONE-ACETAMINOPHEN 5-325 MG PO TABS
1.0000 | ORAL_TABLET | Freq: Four times a day (QID) | ORAL | Status: DC | PRN
  Administered 2014-02-01: 1 via ORAL
  Filled 2014-01-30: qty 1

## 2014-01-30 MED ORDER — FUROSEMIDE 40 MG PO TABS
40.0000 mg | ORAL_TABLET | Freq: Every day | ORAL | Status: DC
  Filled 2014-01-30: qty 1

## 2014-01-30 MED ORDER — FUROSEMIDE 40 MG PO TABS
40.0000 mg | ORAL_TABLET | Freq: Every day | ORAL | Status: DC
  Administered 2014-01-31 – 2014-02-01 (×2): 40 mg via ORAL
  Filled 2014-01-30 (×3): qty 1

## 2014-01-30 NOTE — Progress Notes (Signed)
    Subjective:  Denies CP or dyspnea   Objective:  Filed Vitals:   01/29/14 1835 01/29/14 1951 01/29/14 2155 01/30/14 1005  BP: 121/71  122/68   Pulse: 95 97 93   Temp: 98.9 F (37.2 C)  98.4 F (36.9 C)   TempSrc: Oral  Oral   Resp: 18 18 18    Height: 5\' 6"  (1.676 m)     Weight: 199 lb 15.3 oz (90.7 kg)     SpO2: 100% 98% 98% 99%    Intake/Output from previous day:  Intake/Output Summary (Last 24 hours) at 01/30/14 1116 Last data filed at 01/30/14 0920  Gross per 24 hour  Intake    240 ml  Output      0 ml  Net    240 ml    Physical Exam: Physical exam: Well-developed well-nourished in no acute distress.  Skin is warm and dry.  HEENT is normal.  Neck is supple.  Chest is clear to auscultation with normal expansion.  Cardiovascular exam is regular rate and rhythm. 2/6 systolic murmur Abdominal exam nontender or distended. No masses palpated. Extremities show no edema. neuro grossly intact    Lab Results: Basic Metabolic Panel:  Recent Labs  01/29/14 0257 01/30/14 0101  NA 138 137  K 4.0 4.6  CL 95* 92*  CO2 31 33*  GLUCOSE 83 119*  BUN 18 11  CREATININE 0.89 0.80  CALCIUM 8.7 8.9   CBC:  Recent Labs  01/29/14 0257 01/30/14 0101  WBC 6.2 6.9  NEUTROABS 4.2  --   HGB 9.4* 9.8*  HCT 31.0* 32.5*  MCV 82.0 81.5  PLT 217 228   Cardiac Enzymes:  Recent Labs  01/29/14 0257 01/29/14 0631 01/29/14 1220  TROPONINI <0.30 <0.30 <0.30     Assessment/Plan:  1 generalized weakness-etiology unclear. Enzymes are negative. Echocardiogram shows normal LV function and moderate aortic stenosis. Patient appears euvolemic on examination. I do not think patient's presentation is likely cardiac. 2 COPD-continue home oxygen. 3 CAD-continue aspirin and statin. 4 history of right heart failure/pulmonary hypertension-change Lasix back to 40 mg daily. 5 abnormal chest CT-Further evaluation per primary care. 6 moderate aortic stenosis. Patient can followup  with Dr. Aundra Dubin for cardiac issues following discharge. We will sign off. Please call with questions. Kirk Ruths 01/30/2014, 11:16 AM

## 2014-01-30 NOTE — Progress Notes (Signed)
Patient alert and oriented x4 throughout shift, vital signs stable.  Patient and husband updated on plan of care.  Patient denies any questions or concerns at this time.  Will continue to monitor.

## 2014-01-30 NOTE — Evaluation (Signed)
Physical Therapy Evaluation Patient Details Name: Jillian Bright MRN: [id number] DOB: 1945/09/16 Today's Date: 01/30/2014   History of Present Illness  68 yo female with end stage COPD, 3L O2 at home, CAD, aortic stenosis, pulm HTN, R BBB and DM is weak and dizzy with mobility, now with compensated respiratory acidosis, anemia. Had been non compliant with O2 use at home and self reduction of lasix.   Clinical Impression  Pt was seen for instruction of safety with new AD as a RW with discomfort of her R knee and mm's limiting her tolerance for gait.  Spoke with MD who is planning further stay to inform that cardiopulmonary status less a limitation than her pain.  He is expecting pt to need RW at dc.    Follow Up Recommendations SNF;Home health PT;Supervision/Assistance - 24 hour    Equipment Recommendations  Rolling walker with 5" wheels    Recommendations for Other Services Other (comment) (HHPT vs SNF)     Precautions / Restrictions Precautions Precautions: Fall Restrictions Weight Bearing Restrictions: No      Mobility  Bed Mobility                  Transfers Overall transfer level: Modified independent Equipment used: Rolling walker (2 wheeled);1 person hand held assist             General transfer comment: reminded her to use hands on chair for both sitting and standing  Ambulation/Gait Ambulation/Gait assistance: Min guard;Min assist (oxygen) Ambulation Distance (Feet): 80 Feet Assistive device: 1 person hand held assist;Rolling walker (2 wheeled) Gait Pattern/deviations: Step-to pattern;Decreased step length - right;Decreased step length - left;Shuffle;Wide base of support;Drifts right/left Gait velocity: slow Gait velocity interpretation: Below normal speed for age/gender General Gait Details: Pt is demonstrating some reluctance to walk far due to chronic knee and leg mm pain.  informed her doctor who wanted to plan her care going forward based on  symptoms.    Stairs            Wheelchair Mobility    Modified Rankin (Stroke Patients Only)       Balance Overall balance assessment: Needs assistance Sitting-balance support: Bilateral upper extremity supported;Feet supported Sitting balance-Leahy Scale: Fair   Postural control: Posterior lean Standing balance support: Bilateral upper extremity supported Standing balance-Leahy Scale: Poor Standing balance comment: Pt is generally fearful of loss of balance and increasing her chronic pain.                             Pertinent Vitals/Pain Pain Assessment: 0-10 Pain Score: 8  Pain Location: R knee and leg muscles Pain Descriptors / Indicators: Aching Pain Intervention(s): Limited activity within patient's tolerance;Monitored during session;Patient requesting pain meds-RN notified BP was 122/68, pulse 93 and O2 sat 99% per nsg notes.    Home Living Family/patient expects to be discharged to:: Private residence Living Arrangements: Alone Available Help at Discharge: Friend(s);Family Type of Home: House Home Access: Stairs to enter Entrance Stairs-Rails: Left Entrance Stairs-Number of Steps: 3 Home Layout: One level Home Equipment: Walker - 4 wheels;Tub bench      Prior Function Level of Independence: Independent with assistive device(s)               Hand Dominance   Dominant Hand: Right    Extremity/Trunk Assessment   Upper Extremity Assessment: Generalized weakness           Lower Extremity Assessment: Generalized weakness  Cervical / Trunk Assessment: Normal  Communication   Communication: No difficulties  Cognition Arousal/Alertness: Awake/alert Behavior During Therapy: WFL for tasks assessed/performed Overall Cognitive Status: Within Functional Limits for tasks assessed       Memory: Decreased recall of precautions              General Comments General comments (skin integrity, edema, etc.): Pt is in need of  assistance to walk more than a rollator, and recommend she upgrade to [facility]    Exercises General Exercises - Lower Extremity Ankle Circles/Pumps: AROM;10 reps;Both;Seated Long Arc Quad: AROM;Both;10 reps;Seated      Assessment/Plan    PT Assessment Patient needs continued PT services  PT Diagnosis Difficulty walking   PT Problem List Decreased strength;Decreased range of motion;Decreased activity tolerance;Decreased balance;Decreased mobility;Decreased coordination;Decreased safety awareness;Obesity;Cardiopulmonary status limiting activity;Pain  PT Treatment Interventions DME instruction;Gait training;Stair training;Functional mobility training;Therapeutic activities;Therapeutic exercise;Balance training;Neuromuscular re-education;Patient/family education   PT Goals (Current goals can be found in the Care Plan section) Acute Rehab PT Goals Patient Stated Goal: to get home when feeling better PT Goal Formulation: With patient Time For Goal Achievement: 02/06/14 Potential to Achieve Goals: Good    Frequency Min 3X/week   Barriers to discharge Inaccessible home environment;Decreased caregiver support Family will help but not living with her    Co-evaluation               End of Session Equipment Utilized During Treatment: Oxygen Activity Tolerance: Patient limited by pain;Patient limited by fatigue Patient left: in chair;with call bell/phone within reach;with chair alarm set Nurse Communication: Mobility status;Patient requests pain meds         Time: 4327-6147 PT Time Calculation (min): 28 min   Charges:   PT Evaluation $Initial PT Evaluation Tier I: 1 Procedure PT Treatments $Gait Training: 8-22 mins   PT G Codes:          Ramond Dial 02/02/14, 11:41 AM Mee Hives, PT MS Acute Rehab Dept. Number: [phone]

## 2014-01-30 NOTE — Progress Notes (Signed)
[facility] PROGRESS NOTE  DENIM START RCV:[id number] DOB: [birth year] DOA: 01/28/2014 PCP: [name], MD  Assessment/Plan: Generalized weakness -PT/OT with recs for SNF thus far -MRI unremarkable -2d echo unremarkable -No focal weakness on exam COPD on chronic O2 tx w/ ?acute on chronic bronchitis  -CT also notes "bihilar infiltrative soft tissue or adenopathy" as well as a lingular consolidation - f/u CT in 6 weeks is suggested  -On rocephin for suspected acute bronchitis 1cm LUL pulmonary nodule  -Repeat CT per above Anemia  -Normocytic -Pt remains hemodynamically stable -Hgb remains stable Abdom pain  -Large amounts of stool noted on CT  -Pt reports multiple large BM overnight with bowel regimen -Cont bowel regimen as needed CAD  -Cardiology consulted -Per Cardiology, presenting symptoms are likely not cardiogenic Moderate Aortic Stenosis -Stable Diastolic CHF  -Grade 1 dysfunction noted on 2d echo -Cardiology recs for lasix 40mg  daily DM -On SSI coverage with trajenta -Diabetic diet  Code Status: DNR Family Communication: Pt in room (indicate person spoken with, relationship, and if by phone, the number) Disposition Plan: Pending placement to SNF   Consultants:  Cardiology  Procedures:    Antibiotics:  Rocephin 9/4>>>  HPI/Subjective: No acute events noted overnight.Still complains of generalized weakness.  Objective: Filed Vitals:   01/29/14 1835 01/29/14 1951 01/29/14 2155 01/30/14 1005  BP: 121/71  122/68   Pulse: 95 97 93   Temp: 98.9 F (37.2 C)  98.4 F (36.9 C)   TempSrc: Oral  Oral   Resp: 18 18 18    Height: 5\' 6"  (1.676 m)     Weight: 90.7 kg (199 lb 15.3 oz)     SpO2: 100% 98% 98% 99%    Intake/Output Summary (Last 24 hours) at 01/30/14 1400 Last data filed at 01/30/14 0920  Gross per 24 hour  Intake    240 ml  Output      0 ml  Net    240 ml   Filed Weights   01/29/14 0045 01/29/14 0500 01/29/14 1835  Weight:  90.7 kg (199 lb 15.3 oz) 90.7 kg (199 lb 15.3 oz) 90.7 kg (199 lb 15.3 oz)    Exam:   General:  Awake, in nad  Cardiovascular: regular, s1, s2  Respiratory: normal resp effort, no wheezing, on [place]  Abdomen: obese, soft, mildly distended  Musculoskeletal: perfused, no clubbing   Data Reviewed: Basic Metabolic Panel:  Recent Labs Lab 01/28/14 1820 01/29/14 0257 01/30/14 0101  NA 138 138 137  K 4.5 4.0 4.6  CL 96 95* 92*  CO2 29 31 33*  GLUCOSE 148* 83 119*  BUN 21 18 11   CREATININE 1.11* 0.89 0.80  CALCIUM 9.0 8.7 8.9   Liver Function Tests:  Recent Labs Lab 01/28/14 1820 01/29/14 0257  AST 30 25  ALT 16 15  ALKPHOS 72 70  BILITOT 0.4 0.3  PROT 7.2 6.7  ALBUMIN 2.7* 2.6*   No results found for this basename: LIPASE, AMYLASE,  in the last 168 hours No results found for this basename: AMMONIA,  in the last 168 hours CBC:  Recent Labs Lab 01/28/14 1820 01/29/14 0257 01/30/14 0101  WBC 8.3 6.2 6.9  NEUTROABS  --  4.2  --   HGB 9.7* 9.4* 9.8*  HCT 32.1* 31.0* 32.5*  MCV 81.9 82.0 81.5  PLT 226 217 228   Cardiac Enzymes:  Recent Labs Lab 01/29/14 0257 01/29/14 0631 01/29/14 1220  TROPONINI <0.30 <0.30 <0.30   BNP (last 3 results)  Recent Labs  01/28/14 2215  PROBNP 1016.0*   CBG:  Recent Labs Lab 01/29/14 1139 01/29/14 1705 01/29/14 2104 01/30/14 0747 01/30/14 1148  GLUCAP 209* 104* 146* 170* 172*    Recent Results (from the past 240 hour(s))  MRSA PCR SCREENING     Status: None   Collection Time    01/29/14  2:23 AM      Result Value Ref Range Status   MRSA by PCR NEGATIVE  NEGATIVE Final   Comment:            The GeneXpert MRSA Assay (FDA     approved for NASAL specimens     only), is one component of a     comprehensive MRSA colonization     surveillance program. It is not     intended to diagnose MRSA     infection nor to guide or     monitor treatment for     MRSA infections.     Studies: Dg Chest 2  View  01/28/2014   CLINICAL DATA:  Weakness and dizziness.  EXAM: CHEST  2 VIEW  COMPARISON:  10/26/2013  FINDINGS: Heart size upper normal to mildly enlarged. Aortic atherosclerosis and mild tortuosity. Left lung base and lingular opacities. There may be trace left pleural fluid or thickening. No pneumothorax. No acute osseous finding.  IMPRESSION: Mild lingular and left lung base opacity, favor atelectasis or scarring.   Electronically Signed   By: Carlos Levering M.D.   On: 01/28/2014 23:14   Ct Angio Chest Pe W/cm &/or Wo Cm  01/29/2014   CLINICAL DATA:  concern of PE; Known Pulmonary HTN  EXAM: CT ANGIOGRAPHY CHEST WITH CONTRAST  TECHNIQUE: Multidetector CT imaging of the chest was performed using the standard protocol during bolus administration of intravenous contrast. Multiplanar CT image reconstructions and MIPs were obtained to evaluate the vascular anatomy.  CONTRAST:  181mL OMNIPAQUE IOHEXOL 350 MG/ML SOLN  COMPARISON:  06/30/2012 CT, 01/28/2014 radiograph  FINDINGS: No pulmonary embolism.  Normal caliber aorta and branch vessels with scattered atherosclerotic disease.  Mildly prominent though reactive size mediastinal lymph nodes. Infiltrative left greater than right hilar soft tissue density. Bilateral lower lobe peribronchial thickening and partially impacted lower lobe bronchials. Lingular airspace opacity. Left greater than right lung base linear opacities, favor atelectasis. 1 cm left upper lobe nodule series 6, image 17.  Central airways are patent.  No pneumothorax.  Apical bullae.  Multilevel degenerative changes. L4 vertebral body hemangioma. L5 vertebral body hemangioma.  See separate abdominal CT report.  Review of the MIP images confirms the above findings.  IMPRESSION: No pulmonary embolism.  1 cm left upper lobe nodule. This is nonspecific and not seen on the prior. Malignancy is not excluded.  Bilateral lower lobe peribronchial thickening and partially impacted bronchioles. This may  reflect acute on chronic bronchitis.  Bihilar infiltrative soft tissue or adenopathy has developed. May be reactive or malignant.  Lingular consolidation may reflect pneumonia or atelectasis.  Mild COPD.  Recommend a short-term (6-8 week) follow-up.   Electronically Signed   By: Carlos Levering M.D.   On: 01/29/2014 05:40   Mr Brain Wo Contrast  01/29/2014   CLINICAL DATA:  Progressive weakness.  Evaluate for stroke.  EXAM: MRI HEAD WITHOUT CONTRAST  TECHNIQUE: Multiplanar, multiecho pulse sequences of the brain and surrounding structures were obtained without intravenous contrast.  COMPARISON:  Head CT 02/01/2012  FINDINGS: There is no evidence of acute infarct, intracranial hemorrhage, mass, midline shift, or extra-axial fluid collection. Foci  of T2 hyperintensity in the subcortical and deep cerebral white matter bilaterally are nonspecific but compatible with mild chronic small vessel ischemic disease. There is mild generalized cerebral atrophy.  Prior bilateral cataract extraction is noted. Paranasal sinuses are clear. There is a small right mastoid effusion. Major intracranial vascular flow voids are preserved.  IMPRESSION: 1. No acute intracranial abnormality. 2. Mild chronic small vessel ischemic disease.   Electronically Signed   By: Logan Bores   On: 01/29/2014 17:35   Ct Abdomen Pelvis W Contrast  01/29/2014   CLINICAL DATA:  Mid abdominal pain  EXAM: CT ABDOMEN AND PELVIS WITH CONTRAST  TECHNIQUE: Multidetector CT imaging of the abdomen and pelvis was performed using the standard protocol following bolus administration of intravenous contrast.  CONTRAST:  144mL OMNIPAQUE IOHEXOL 350 MG/ML SOLN  COMPARISON:  06/30/2012 chest CT  FINDINGS: See separate chest CT report. Normal heart size. Coronary artery calcifications aortic valvular calcifications.  There are numerous hepatic hypodensities, similar to prior, favored reflect biliary cyst.  No appreciable abnormality of the spleen, pancreas, adrenal  glands.  Distended, thin-walled gallbladder. No radiodense gallstones. No biliary ductal dilatation.  Symmetric renal enhancement.  No hydroureteronephrosis.  Moderate stool burden. Appendix not identified. No overt colitis. No right lower quadrant inflammation. Small bowel loops are of normal course and caliber. No free intraperitoneal air or fluid. No abdominal lymphadenopathy. Reactive sized retroperitoneal and iliac chain nodes.  Thin walled bladder. Nonspecific appearance to the uterus. No adnexal mass.  Multilevel degenerative changes.  No acute osseous finding.  IMPRESSION: Distended gallbladder. No radiodense gallstones or pericholecystic fluid to suggest cholecystitis by CT. Correlate with LFTs and ultrasound if concern for acute biliary pathology persists.  Moderate stool burden.  No overt colitis.  No bowel obstruction.   Electronically Signed   By: Carlos Levering M.D.   On: 01/29/2014 05:48    Scheduled Meds: . aspirin EC  81 mg Oral Daily  . atorvastatin  20 mg Oral Daily  . budesonide-formoterol  2 puff Inhalation BID  . cefTRIAXone (ROCEPHIN)  IV  1 g Intravenous Q24H  . celecoxib  200 mg Oral BID  . cyanocobalamin  1,000 mcg Subcutaneous Q1500  . darifenacin  15 mg Oral Daily  . DULoxetine  60 mg Oral Daily  . enoxaparin (LOVENOX) injection  40 mg Subcutaneous Q24H  . insulin aspart  0-9 Units Subcutaneous TID WC  . linagliptin  5 mg Oral Daily  . pantoprazole  80 mg Oral Daily  . polyethylene glycol  17 g Oral BID  . senna-docusate  1 tablet Oral BID   Continuous Infusions:   Active Problems:   Weakness   Abdominal pain  Time spent: 69min  CH[facility], [facility] Pager [phone]. If 7PM-7AM, please contact night-coverage at [email or web address], password [facility] 01/30/2014, 2:00 PM  LOS: 2 days

## 2014-01-31 DIAGNOSIS — E119 Type 2 diabetes mellitus without complications: Secondary | ICD-10-CM

## 2014-01-31 DIAGNOSIS — R109 Unspecified abdominal pain: Secondary | ICD-10-CM

## 2014-01-31 DIAGNOSIS — I509 Heart failure, unspecified: Secondary | ICD-10-CM

## 2014-01-31 DIAGNOSIS — I10 Essential (primary) hypertension: Secondary | ICD-10-CM

## 2014-01-31 DIAGNOSIS — IMO0001 Reserved for inherently not codable concepts without codable children: Secondary | ICD-10-CM

## 2014-01-31 LAB — GLUCOSE, CAPILLARY
GLUCOSE-CAPILLARY: 148 mg/dL — AB (ref 70–99)
GLUCOSE-CAPILLARY: 217 mg/dL — AB (ref 70–99)
Glucose-Capillary: 183 mg/dL — ABNORMAL HIGH (ref 70–99)
Glucose-Capillary: 172 mg/dL — ABNORMAL HIGH (ref 70–99)

## 2014-01-31 NOTE — Progress Notes (Signed)
[facility] PROGRESS NOTE  Jillian Bright BMW:[id number] DOB: 07/14/1945 DOA: 01/28/2014 PCP: [name], MD  Assessment/Plan: Generalized weakness -PT/OT with recs for SNF placement -SW on board -MRI unremarkable -2d echo unremarkable -No focal weakness on exam COPD on chronic O2 tx w/ ?acute on chronic bronchitis  -CT also notes "bihilar infiltrative soft tissue or adenopathy" as well as a lingular consolidation - f/u CT in 6 weeks is suggested  -On empiric rocephin for suspected acute bronchitis 1cm LUL pulmonary nodule  -Repeat CT per above Anemia  -Normocytic -Pt remains hemodynamically stable -Hgb remains stable Abdom pain  -Large amounts of stool noted on CT  -Pt reports multiple large BM overnight with bowel regimen -Cont bowel regimen as needed CAD  -Cardiology consulted -Per Cardiology, presenting symptoms are likely not cardiogenic and have since signed off as of 9/5 Moderate Aortic Stenosis -Stable Diastolic CHF  -Grade 1 dysfunction noted on 2d echo -Cardiology recs for lasix 40mg  daily DM -On SSI coverage with trajenta -Diabetic diet  Code Status: DNR Family Communication: Pt in room Disposition Plan: Pending placement to SNF   Consultants:  Cardiology  Procedures:    Antibiotics:  Rocephin 9/4>>>  HPI/Subjective: Pt without complaints. No acute issues noted overnight  Objective: Filed Vitals:   01/30/14 1459 01/30/14 2122 01/31/14 0548 01/31/14 0948  BP: 104/64 117/61 120/70   Pulse: 94 94 92   Temp: 98.3 F (36.8 C) 98 F (36.7 C) 98.2 F (36.8 C)   TempSrc: Oral Oral Oral   Resp: 18 18 17    Height:      Weight:   90.5 kg (199 lb 8.3 oz)   SpO2: 94% 99% 97% 97%    Intake/Output Summary (Last 24 hours) at 01/31/14 1405 Last data filed at 01/31/14 1003  Gross per 24 hour  Intake    480 ml  Output   1300 ml  Net   -820 ml   Filed Weights   01/29/14 0500 01/29/14 1835 01/31/14 0548  Weight: 90.7 kg (199 lb 15.3 oz)  90.7 kg (199 lb 15.3 oz) 90.5 kg (199 lb 8.3 oz)    Exam:   General:  Awake, in nad  Cardiovascular: regular, s1, s2  Respiratory: normal resp effort, no wheezing, on [place]  Abdomen: obese, soft, mildly distended  Musculoskeletal: perfused, no clubbing   Data Reviewed: Basic Metabolic Panel:  Recent Labs Lab 01/28/14 1820 01/29/14 0257 01/30/14 0101  NA 138 138 137  K 4.5 4.0 4.6  CL 96 95* 92*  CO2 29 31 33*  GLUCOSE 148* 83 119*  BUN 21 18 11   CREATININE 1.11* 0.89 0.80  CALCIUM 9.0 8.7 8.9   Liver Function Tests:  Recent Labs Lab 01/28/14 1820 01/29/14 0257  AST 30 25  ALT 16 15  ALKPHOS 72 70  BILITOT 0.4 0.3  PROT 7.2 6.7  ALBUMIN 2.7* 2.6*   No results found for this basename: LIPASE, AMYLASE,  in the last 168 hours No results found for this basename: AMMONIA,  in the last 168 hours CBC:  Recent Labs Lab 01/28/14 1820 01/29/14 0257 01/30/14 0101  WBC 8.3 6.2 6.9  NEUTROABS  --  4.2  --   HGB 9.7* 9.4* 9.8*  HCT 32.1* 31.0* 32.5*  MCV 81.9 82.0 81.5  PLT 226 217 228   Cardiac Enzymes:  Recent Labs Lab 01/29/14 0257 01/29/14 0631 01/29/14 1220  TROPONINI <0.30 <0.30 <0.30   BNP (last 3 results)  Recent Labs  01/28/14 2215  PROBNP  1016.0*   CBG:  Recent Labs Lab 01/30/14 1148 01/30/14 1740 01/30/14 2120 01/31/14 0755 01/31/14 1123  GLUCAP 172* 123* 136* 148* 217*    Recent Results (from the past 240 hour(s))  MRSA PCR SCREENING     Status: None   Collection Time    01/29/14  2:23 AM      Result Value Ref Range Status   MRSA by PCR NEGATIVE  NEGATIVE Final   Comment:            The GeneXpert MRSA Assay (FDA     approved for NASAL specimens     only), is one component of a     comprehensive MRSA colonization     surveillance program. It is not     intended to diagnose MRSA     infection nor to guide or     monitor treatment for     MRSA infections.     Studies: Mr Brain Wo Contrast  01/29/2014   CLINICAL DATA:   Progressive weakness.  Evaluate for stroke.  EXAM: MRI HEAD WITHOUT CONTRAST  TECHNIQUE: Multiplanar, multiecho pulse sequences of the brain and surrounding structures were obtained without intravenous contrast.  COMPARISON:  Head CT 02/01/2012  FINDINGS: There is no evidence of acute infarct, intracranial hemorrhage, mass, midline shift, or extra-axial fluid collection. Foci of T2 hyperintensity in the subcortical and deep cerebral white matter bilaterally are nonspecific but compatible with mild chronic small vessel ischemic disease. There is mild generalized cerebral atrophy.  Prior bilateral cataract extraction is noted. Paranasal sinuses are clear. There is a small right mastoid effusion. Major intracranial vascular flow voids are preserved.  IMPRESSION: 1. No acute intracranial abnormality. 2. Mild chronic small vessel ischemic disease.   Electronically Signed   By: Logan Bores   On: 01/29/2014 17:35    Scheduled Meds: . aspirin EC  81 mg Oral Daily  . atorvastatin  20 mg Oral Daily  . budesonide-formoterol  2 puff Inhalation BID  . cefTRIAXone (ROCEPHIN)  IV  1 g Intravenous Q24H  . celecoxib  200 mg Oral BID  . cyanocobalamin  1,000 mcg Subcutaneous Q1500  . darifenacin  15 mg Oral Daily  . DULoxetine  60 mg Oral Daily  . enoxaparin (LOVENOX) injection  40 mg Subcutaneous Q24H  . furosemide  40 mg Oral Q breakfast  . gabapentin  300 mg Oral TID  . insulin aspart  0-9 Units Subcutaneous TID WC  . linagliptin  5 mg Oral Daily  . pantoprazole  80 mg Oral Daily  . polyethylene glycol  17 g Oral BID  . senna-docusate  1 tablet Oral BID   Continuous Infusions:   Active Problems:   Weakness   Abdominal pain  Time spent: 45min  CHIU, [facility] Pager [phone]. If 7PM-7AM, please contact night-coverage at [email or web address], password [facility] 01/31/2014, 2:05 PM  LOS: 3 days

## 2014-01-31 NOTE — Clinical Social Work Note (Signed)
Clinical Social Work Department BRIEF PSYCHOSOCIAL ASSESSMENT 01/31/2014  Patient:  Jillian Bright, Jillian Bright     Account Number:  [id number]     Admit date:  01/28/2014  Clinical Social Worker:  Hubert Azure  Date/Time:  01/31/2014 07:07 PM  Referred by:  Physician  Date Referred:  01/31/2014 Referred for  SNF Placement   Other Referral:   Interview type:  Patient Other interview type:    PSYCHOSOCIAL DATA Living Status:  ALONE Admitted from facility:   Level of care:   Primary support name:  Eustace Moore ([phone]) Primary support relationship to patient:  NEIGHBOR Degree of support available:   Good. Neighbor checks in on patient daily.    CURRENT CONCERNS Current Concerns  Post-Acute Placement   Other Concerns:    SOCIAL WORK ASSESSMENT / PLAN CSW met with patient who was alert and oriented x4. Patient's friend Ronalee Belts was also present in room, but excited room shortly after introduction. CSW introduced self and explained role. CSW explained SNF placement process and discussed d/c  plan with patient.    Per patient, she lives alone and didn't know she wasn't taking good care of herself. Per patient, she has noticed a decline in ADL's and self care. Patient is agreeable to SNF placement with preference for [facility]. Patient stated she does not have an alternate SNF but is open to whoever offers a bed close to her home.   Assessment/plan status:  Information/Referral to [employer] Other assessment/ plan:   CSW to complete FL2 and submit PASARR for SNF placement.   Information/referral to community resources:    PATIENT'S/FAMILY'S RESPONSE TO PLAN OF CARE: Patient was cooperative and engaged appropriately during assessment. Patient expressed some anxiety with going to a SNF for the first time and stated she just wants to make sure she goes to a "good, quality," SNF. Patient's anxiety was reduced when CSW explained SNF placement process and discussed  locations of SNF with patient.   [place], [place] Weekend Clinical Social Worker [phone]

## 2014-02-01 DIAGNOSIS — D649 Anemia, unspecified: Secondary | ICD-10-CM

## 2014-02-01 DIAGNOSIS — E662 Morbid (severe) obesity with alveolar hypoventilation: Secondary | ICD-10-CM

## 2014-02-01 LAB — VITAMIN D 1,25 DIHYDROXY
Vitamin D 1, 25 (OH)2 Total: 19 pg/mL (ref 18–72)
Vitamin D2 1, 25 (OH)2: <8 pg/mL
Vitamin D3 1, 25 (OH)2: 19 pg/mL

## 2014-02-01 LAB — GLUCOSE, CAPILLARY
GLUCOSE-CAPILLARY: 160 mg/dL — AB (ref 70–99)
Glucose-Capillary: 173 mg/dL — ABNORMAL HIGH (ref 70–99)

## 2014-02-01 LAB — URINE CULTURE
Colony Count: NO GROWTH
Culture: NO GROWTH

## 2014-02-01 MED ORDER — ALPRAZOLAM 0.5 MG PO TABS
0.5000 mg | ORAL_TABLET | Freq: Three times a day (TID) | ORAL | Status: DC | PRN
Start: 2014-02-01 — End: 2014-09-29

## 2014-02-01 MED ORDER — HYDROCODONE-ACETAMINOPHEN 10-325 MG PO TABS: 1.0000 | ORAL_TABLET | Freq: Four times a day (QID) | ORAL | Status: DC | PRN

## 2014-02-01 MED ORDER — AMOXICILLIN-POT CLAVULANATE 875-125 MG PO TABS: 1.0000 | ORAL_TABLET | Freq: Two times a day (BID) | ORAL | Status: DC

## 2014-02-01 MED ORDER — HYDROCODONE-ACETAMINOPHEN 10-325 MG PO TABS
1.0000 | ORAL_TABLET | Freq: Four times a day (QID) | ORAL | Status: DC | PRN
  Administered 2014-02-01: 1 via ORAL
  Filled 2014-02-01: qty 1

## 2014-02-01 NOTE — Discharge Summary (Signed)
Physician Discharge Summary  Jillian Bright TGY:[id number] DOB: 1946/03/25 DOA: 01/28/2014  PCP: [name], MD  Admit date: 01/28/2014 Discharge date: 02/01/2014  Time spent: 35 minutes  Recommendations for Outpatient Follow-up:  1. Follow up with PCP in 1-2 weeks 2. Repeat CT chest in 6-8 weeks  Discharge Diagnoses:  Active Problems:   DIABETES MELLITUS, TYPE II   COPD with emphysema   Occlusion and stenosis of carotid artery without mention of cerebral infarction   Weakness   Abdominal pain  Discharge Condition: Stable  Diet recommendation: Diabetic  Filed Weights   01/29/14 1835 01/31/14 0548 02/01/14 0518  Weight: 90.7 kg (199 lb 15.3 oz) 90.5 kg (199 lb 8.3 oz) 86.637 kg (191 lb)    History of present illness:  Please see admit h and p from 9/4 for details. Briefly, pt presents with generalized weakness and was admitted for further work up.  Hospital Course:  Generalized weakness  -PT/OT with recs for SNF placement  -SW following -MRI head was unremarkable  -2d echo unremarkable with an EF of 55-60% -No focal weakness on exam  COPD on chronic O2 tx w/ ?acute on chronic bronchitis  -CT also notes "bihilar infiltrative soft tissue or adenopathy" as well as a lingular consolidation - f/u CT in 6 weeks is suggested  -On empiric rocephin for suspected acute bronchitis, will be discharged on Augmentin on discharge 1cm LUL pulmonary nodule  -Repeat CT per above  Anemia  -Normocytic  -Pt remains hemodynamically stable  -Hgb remains stable  Abdom pain  -Large amounts of stool noted on CT  -Pt with multiple large BM with bowel regimen and improvement in symptoms -Advised to cont bowel regimen as needed  CAD  -Cardiology consulted  -Per Cardiology, presenting symptoms are likely not cardiogenic and have since signed off as of 9/5  Moderate Aortic Stenosis  -Stable  Diastolic CHF  -Grade 1 dysfunction noted on 2d echo  -Cardiology recs for lasix 40mg  daily  DM   -On SSI coverage with trajenta  -Diabetic diet  Consultations:  Cardiology  Discharge Exam: Filed Vitals:   01/31/14 1405 01/31/14 2122 02/01/14 0518 02/01/14 0844  BP: 109/64 136/74 118/66   Pulse: 94 88 85 85  Temp: 98.1 F (36.7 C) 98.2 F (36.8 C) 98.1 F (36.7 C)   TempSrc: Oral Oral Oral   Resp: 16 18 18 18   Height:      Weight:   86.637 kg (191 lb)   SpO2: 94% 94% 93% 94%    General: Awake, in nad Cardiovascular: regular, s1, s2 Respiratory: normal resp effort, no wheezing  Discharge Instructions     Medication List         albuterol 108 (90 BASE) MCG/ACT inhaler  Commonly known as:  PROVENTIL HFA;VENTOLIN HFA  Inhale 2 puffs into the lungs every 6 (six) hours as needed for wheezing or shortness of breath.     ALPRAZolam 0.5 MG tablet  Commonly known as:  XANAX  Take 1 tablet (0.5 mg total) by mouth 3 (three) times daily as needed for anxiety.     amoxicillin-clavulanate 875-125 MG per tablet  Commonly known as:  AUGMENTIN  Take 1 tablet by mouth 2 (two) times daily.     aspirin EC 81 MG tablet  Take 1 tablet (81 mg total) by mouth daily.     atorvastatin 20 MG tablet  Commonly known as:  LIPITOR  Take 1 tablet (20 mg total) by mouth daily.     budesonide-formoterol  160-4.5 MCG/ACT inhaler  Commonly known as:  SYMBICORT  Inhale 2 puffs into the lungs 2 (two) times daily.     celecoxib 200 MG capsule  Commonly known as:  CELEBREX  Take 1 capsule (200 mg total) by mouth 2 (two) times daily.     DULoxetine 60 MG capsule  Commonly known as:  CYMBALTA  Take 1 capsule (60 mg total) by mouth daily.     furosemide 40 MG tablet  Commonly known as:  LASIX  Take 40 mg by mouth daily.     gabapentin 300 MG capsule  Commonly known as:  NEURONTIN  Take 1 capsule (300 mg total) by mouth 3 (three) times daily.     glipiZIDE 10 MG tablet  Commonly known as:  GLUCOTROL  Take 1 tablet (10 mg total) by mouth 2 (two) times daily before a meal.      glucose blood test strip  Commonly known as:  ACCU-CHEK AVIVA PLUS  Test once per day and diagnosis code is 250.00     HYDROcodone-acetaminophen 10-325 MG per tablet  Commonly known as:  NORCO  Take 1 tablet by mouth every 6 (six) hours as needed (pain).     metFORMIN 1000 MG tablet  Commonly known as:  GLUCOPHAGE  Take 1,000 mg by mouth 2 (two) times daily with a meal.     omeprazole 40 MG capsule  Commonly known as:  PRILOSEC  Take 40 mg by mouth daily before breakfast.     sitaGLIPtin 100 MG tablet  Commonly known as:  JANUVIA  Take 1 tablet (100 mg total) by mouth daily.     solifenacin 5 MG tablet  Commonly known as:  VESICARE  Take 10 mg by mouth daily.     spironolactone 25 MG tablet  Commonly known as:  ALDACTONE  Take 25 mg by mouth daily.       Allergies  Allergen Reactions  . Codeine     Takes vicodin at home   Follow-up Information   Follow up with FRY,[name], MD. Schedule an appointment as soon as possible for a visit in 1 week.   Specialty:  Family Medicine   Contact information:   [place] [address] [phone]        The results of significant diagnostics from this hospitalization (including imaging, microbiology, ancillary and laboratory) are listed below for reference.    Significant Diagnostic Studies: Dg Chest 2 View  01/28/2014   CLINICAL DATA:  Weakness and dizziness.  EXAM: CHEST  2 VIEW  COMPARISON:  10/26/2013  FINDINGS: Heart size upper normal to mildly enlarged. Aortic atherosclerosis and mild tortuosity. Left lung base and lingular opacities. There may be trace left pleural fluid or thickening. No pneumothorax. No acute osseous finding.  IMPRESSION: Mild lingular and left lung base opacity, favor atelectasis or scarring.   Electronically Signed   By: Carlos Levering M.D.   On: 01/28/2014 23:14   Ct Angio Chest Pe W/cm &/or Wo Cm  01/29/2014   CLINICAL DATA:  concern of PE; Known Pulmonary HTN  EXAM: CT  ANGIOGRAPHY CHEST WITH CONTRAST  TECHNIQUE: Multidetector CT imaging of the chest was performed using the standard protocol during bolus administration of intravenous contrast. Multiplanar CT image reconstructions and MIPs were obtained to evaluate the vascular anatomy.  CONTRAST:  134mL OMNIPAQUE IOHEXOL 350 MG/ML SOLN  COMPARISON:  06/30/2012 CT, 01/28/2014 radiograph  FINDINGS: No pulmonary embolism.  Normal caliber aorta and branch vessels with scattered  atherosclerotic disease.  Mildly prominent though reactive size mediastinal lymph nodes. Infiltrative left greater than right hilar soft tissue density. Bilateral lower lobe peribronchial thickening and partially impacted lower lobe bronchials. Lingular airspace opacity. Left greater than right lung base linear opacities, favor atelectasis. 1 cm left upper lobe nodule series 6, image 17.  Central airways are patent.  No pneumothorax.  Apical bullae.  Multilevel degenerative changes. L4 vertebral body hemangioma. L5 vertebral body hemangioma.  See separate abdominal CT report.  Review of the MIP images confirms the above findings.  IMPRESSION: No pulmonary embolism.  1 cm left upper lobe nodule. This is nonspecific and not seen on the prior. Malignancy is not excluded.  Bilateral lower lobe peribronchial thickening and partially impacted bronchioles. This may reflect acute on chronic bronchitis.  Bihilar infiltrative soft tissue or adenopathy has developed. May be reactive or malignant.  Lingular consolidation may reflect pneumonia or atelectasis.  Mild COPD.  Recommend a short-term (6-8 week) follow-up.   Electronically Signed   By: Carlos Levering M.D.   On: 01/29/2014 05:40   Mr Brain Wo Contrast  01/29/2014   CLINICAL DATA:  Progressive weakness.  Evaluate for stroke.  EXAM: MRI HEAD WITHOUT CONTRAST  TECHNIQUE: Multiplanar, multiecho pulse sequences of the brain and surrounding structures were obtained without intravenous contrast.  COMPARISON:  Head CT  02/01/2012  FINDINGS: There is no evidence of acute infarct, intracranial hemorrhage, mass, midline shift, or extra-axial fluid collection. Foci of T2 hyperintensity in the subcortical and deep cerebral white matter bilaterally are nonspecific but compatible with mild chronic small vessel ischemic disease. There is mild generalized cerebral atrophy.  Prior bilateral cataract extraction is noted. Paranasal sinuses are clear. There is a small right mastoid effusion. Major intracranial vascular flow voids are preserved.  IMPRESSION: 1. No acute intracranial abnormality. 2. Mild chronic small vessel ischemic disease.   Electronically Signed   By: Logan Bores   On: 01/29/2014 17:35   Ct Abdomen Pelvis W Contrast  01/29/2014   CLINICAL DATA:  Mid abdominal pain  EXAM: CT ABDOMEN AND PELVIS WITH CONTRAST  TECHNIQUE: Multidetector CT imaging of the abdomen and pelvis was performed using the standard protocol following bolus administration of intravenous contrast.  CONTRAST:  115mL OMNIPAQUE IOHEXOL 350 MG/ML SOLN  COMPARISON:  06/30/2012 chest CT  FINDINGS: See separate chest CT report. Normal heart size. Coronary artery calcifications aortic valvular calcifications.  There are numerous hepatic hypodensities, similar to prior, favored reflect biliary cyst.  No appreciable abnormality of the spleen, pancreas, adrenal glands.  Distended, thin-walled gallbladder. No radiodense gallstones. No biliary ductal dilatation.  Symmetric renal enhancement.  No hydroureteronephrosis.  Moderate stool burden. Appendix not identified. No overt colitis. No right lower quadrant inflammation. Small bowel loops are of normal course and caliber. No free intraperitoneal air or fluid. No abdominal lymphadenopathy. Reactive sized retroperitoneal and iliac chain nodes.  Thin walled bladder. Nonspecific appearance to the uterus. No adnexal mass.  Multilevel degenerative changes.  No acute osseous finding.  IMPRESSION: Distended gallbladder. No  radiodense gallstones or pericholecystic fluid to suggest cholecystitis by CT. Correlate with LFTs and ultrasound if concern for acute biliary pathology persists.  Moderate stool burden.  No overt colitis.  No bowel obstruction.   Electronically Signed   By: Carlos Levering M.D.   On: 01/29/2014 05:48    Microbiology: Recent Results (from the past 240 hour(s))  MRSA PCR SCREENING     Status: None   Collection Time    01/29/14  2:23 AM      Result Value Ref Range Status   MRSA by PCR NEGATIVE  NEGATIVE Final   Comment:            The GeneXpert MRSA Assay (FDA     approved for NASAL specimens     only), is one component of a     comprehensive MRSA colonization     surveillance program. It is not     intended to diagnose MRSA     infection nor to guide or     monitor treatment for     MRSA infections.  URINE CULTURE     Status: None   Collection Time    01/30/14  2:31 PM      Result Value Ref Range Status   Specimen Description URINE, CLEAN CATCH   Final   Special Requests NONE   Final   Culture  Setup Time     Final   Value: 01/31/2014 02:36     Performed at [facility] Count     Final   Value: NO GROWTH     Performed at [facility]   Culture     Final   Value: NO GROWTH     Performed at [facility]   Report Status 02/01/2014 FINAL   Final     Labs: Basic Metabolic Panel:  Recent Labs Lab 01/28/14 1820 01/29/14 0257 01/30/14 0101  NA 138 138 137  K 4.5 4.0 4.6  CL 96 95* 92*  CO2 29 31 33*  GLUCOSE 148* 83 119*  BUN 21 18 11   CREATININE 1.11* 0.89 0.80  CALCIUM 9.0 8.7 8.9   Liver Function Tests:  Recent Labs Lab 01/28/14 1820 01/29/14 0257  AST 30 25  ALT 16 15  ALKPHOS 72 70  BILITOT 0.4 0.3  PROT 7.2 6.7  ALBUMIN 2.7* 2.6*   No results found for this basename: LIPASE, AMYLASE,  in the last 168 hours No results found for this basename: AMMONIA,  in the last 168 hours CBC:  Recent Labs Lab 01/28/14 1820  01/29/14 0257 01/30/14 0101  WBC 8.3 6.2 6.9  NEUTROABS  --  4.2  --   HGB 9.7* 9.4* 9.8*  HCT 32.1* 31.0* 32.5*  MCV 81.9 82.0 81.5  PLT 226 217 228   Cardiac Enzymes:  Recent Labs Lab 01/29/14 0257 01/29/14 0631 01/29/14 1220  TROPONINI <0.30 <0.30 <0.30   BNP: BNP (last 3 results)  Recent Labs  01/28/14 2215  PROBNP 1016.0*   CBG:  Recent Labs Lab 01/31/14 1123 01/31/14 1653 01/31/14 2222 02/01/14 0807 02/01/14 1147  GLUCAP 217* 183* 172* 160* 173*   Signed:  Aaran Enberg K  [facility] 02/01/2014, 12:22 PM

## 2014-02-01 NOTE — Progress Notes (Signed)
Pt prepared for d/c to SNF. IV d/c'd. Skin intact except as most recently charted. Vitals are stable. Report called to receiving facility. Pt to be transported by ambulance service. 

## 2014-02-01 NOTE — Progress Notes (Signed)
OT Cancellation Note  Patient Details Name: Jillian Bright MRN: [id number] DOB: [birth year]   Cancelled Treatment:    Reason Eval/Treat Not Completed: Other (comment) Pt is Medicare and current D/C plan is SNF. No apparent immediate acute care OT needs, therefore will defer OT to SNF. If OT eval is needed please call Acute Rehab Dept. at [phone] or text page OT at [phone].    Almon Register [phone] 02/01/2014, 8:12 AM

## 2014-02-01 NOTE — Progress Notes (Signed)
CARE MANAGEMENT NOTE 02/01/2014  Patient:  Jillian Bright, Jillian Bright   Account Number:  [id number]  Date Initiated:  02/01/2014  Documentation initiated by:  [facility]  Subjective/Objective Assessment:   DIABETES MELLITUS, TYPE II  COPD     Action/Plan:   Anticipated DC Date:  02/01/2014   Anticipated DC Plan:  [place]  CM consult      Choice offered to / List presented to:             Status of service:  Completed, signed off Medicare Important Message given?  YES (If response is "NO", the following Medicare IM given date fields will be blank) Date Medicare IM given:  02/01/2014 Medicare IM given by:  [facility] Date Additional Medicare IM given:   Additional Medicare IM given by:    Discharge Disposition:  [place]  Per UR Regulation:  Reviewed for med. necessity/level of care/duration of stay  If discussed at [place] of Stay Meetings, dates discussed:    Comments:  02/01/2014 1545 Dc to SNF. Jonnie Finner RN CCM Case Mgmt phone [phone]

## 2014-02-02 ENCOUNTER — Other Ambulatory Visit (HOSPITAL_COMMUNITY): Payer: Self-pay | Admitting: Internal Medicine

## 2014-02-02 DIAGNOSIS — R222 Localized swelling, mass and lump, trunk: Secondary | ICD-10-CM

## 2014-02-18 ENCOUNTER — Telehealth: Payer: Self-pay | Admitting: Family Medicine

## 2014-02-18 NOTE — Telephone Encounter (Signed)
Pt walk in office requesting hospital fup from [facility]. Can I create 30 min slot?

## 2014-02-19 NOTE — Telephone Encounter (Signed)
Please make a 30 minute slot for next week

## 2014-02-19 NOTE — Telephone Encounter (Signed)
Pt has been sch for 02-23-14

## 2014-02-23 ENCOUNTER — Ambulatory Visit (INDEPENDENT_AMBULATORY_CARE_PROVIDER_SITE_OTHER): Payer: Medicare Other | Admitting: Family Medicine

## 2014-02-23 ENCOUNTER — Encounter: Payer: Self-pay | Admitting: Family Medicine

## 2014-02-23 VITALS — BP 102/67 | HR 100 | Temp 98.3°F | Ht 66.0 in | Wt 188.0 lb

## 2014-02-23 DIAGNOSIS — I2510 Atherosclerotic heart disease of native coronary artery without angina pectoris: Secondary | ICD-10-CM

## 2014-02-23 DIAGNOSIS — E662 Morbid (severe) obesity with alveolar hypoventilation: Secondary | ICD-10-CM

## 2014-02-23 DIAGNOSIS — I509 Heart failure, unspecified: Secondary | ICD-10-CM

## 2014-02-23 DIAGNOSIS — I6529 Occlusion and stenosis of unspecified carotid artery: Secondary | ICD-10-CM

## 2014-02-23 DIAGNOSIS — R5383 Other fatigue: Secondary | ICD-10-CM

## 2014-02-23 DIAGNOSIS — J438 Other emphysema: Secondary | ICD-10-CM

## 2014-02-23 DIAGNOSIS — I209 Angina pectoris, unspecified: Secondary | ICD-10-CM

## 2014-02-23 DIAGNOSIS — I10 Essential (primary) hypertension: Secondary | ICD-10-CM

## 2014-02-23 DIAGNOSIS — I2789 Other specified pulmonary heart diseases: Secondary | ICD-10-CM

## 2014-02-23 DIAGNOSIS — Z23 Encounter for immunization: Secondary | ICD-10-CM

## 2014-02-23 DIAGNOSIS — R531 Weakness: Secondary | ICD-10-CM

## 2014-02-23 DIAGNOSIS — J439 Emphysema, unspecified: Secondary | ICD-10-CM

## 2014-02-23 DIAGNOSIS — I25119 Atherosclerotic heart disease of native coronary artery with unspecified angina pectoris: Secondary | ICD-10-CM

## 2014-02-23 DIAGNOSIS — R5381 Other malaise: Secondary | ICD-10-CM

## 2014-02-23 MED ORDER — HYDROCODONE-ACETAMINOPHEN 10-325 MG PO TABS: 1.0000 | ORAL_TABLET | Freq: Four times a day (QID) | ORAL | Status: DC | PRN

## 2014-02-23 MED ORDER — OMEPRAZOLE 40 MG PO CPDR: 40.0000 mg | DELAYED_RELEASE_CAPSULE | Freq: Every day | ORAL | Status: DC

## 2014-02-23 MED ORDER — ATORVASTATIN CALCIUM 20 MG PO TABS: 20.0000 mg | ORAL_TABLET | Freq: Every day | ORAL | Status: DC

## 2014-02-23 MED ORDER — GABAPENTIN 300 MG PO CAPS: 300.0000 mg | ORAL_CAPSULE | Freq: Three times a day (TID) | ORAL | Status: DC

## 2014-02-23 MED ORDER — SOLIFENACIN SUCCINATE 10 MG PO TABS: 10.0000 mg | ORAL_TABLET | Freq: Every day | ORAL | Status: DC

## 2014-02-23 MED ORDER — GLUCOSE BLOOD VI STRP: ORAL_STRIP | Status: DC

## 2014-02-23 NOTE — Progress Notes (Signed)
Pre visit review using our clinic review tool, if applicable. No additional management support is needed unless otherwise documented below in the visit note. 

## 2014-02-23 NOTE — Progress Notes (Signed)
   Subjective:    Patient ID: Jillian Bright, female    DOB: [birth year], 68 y.o.   MRN: [id number]  HPI Here to follow up a hospital stay from 01-28-14 to 02-01-14 for generalized weakness. After multiple tests it was decided her main problem was respiratory stemming form a combination of COPD, pulmonary HTN, and obesity hypoventilation syndrome. She was also treated for an apparent bronchitis seen on a CXR, even though she was not coughing and was not febrile. She gradually regained a little strength, and she was then sent for a rehab stay at [facility] from 02-01-14 until she went home on 02-21-14. Now she feels better with more strength. She is on continuous oxygen at 3 liters.    Review of Systems  Constitutional: Negative.   Respiratory: Positive for shortness of breath. Negative for cough, choking and wheezing.   Cardiovascular: Negative.   Neurological: Negative.        Objective:   Physical Exam  Constitutional: She is oriented to person, place, and time. She appears well-developed and well-nourished.  Neck: No thyromegaly present.  Cardiovascular: Normal rate, regular rhythm, normal heart sounds and intact distal pulses.   Pulmonary/Chest: Effort normal and breath sounds normal.  Musculoskeletal:  1+ edema in both feet   Lymphadenopathy:    She has no cervical adenopathy.  Neurological: She is alert and oriented to person, place, and time.          Assessment & Plan:  She seems to be doing quite well at this point. She will follow up with [place] and Dr. Halford Chessman as scheduled. Given a flu shot today.

## 2014-02-23 NOTE — Addendum Note (Signed)
Addended by: Aggie Hacker A on: 02/23/2014 05:12 PM   Modules accepted: Orders

## 2014-03-12 ENCOUNTER — Other Ambulatory Visit: Payer: Self-pay

## 2014-03-13 ENCOUNTER — Other Ambulatory Visit: Payer: Self-pay | Admitting: Family Medicine

## 2014-03-16 ENCOUNTER — Ambulatory Visit (HOSPITAL_COMMUNITY): Admission: RE | Admit: 2014-03-16 | Payer: Medicare Other | Source: Ambulatory Visit

## 2014-05-06 ENCOUNTER — Encounter: Payer: Self-pay | Admitting: Family

## 2014-05-07 ENCOUNTER — Ambulatory Visit: Payer: Medicare Other | Admitting: Family

## 2014-05-07 ENCOUNTER — Other Ambulatory Visit (HOSPITAL_COMMUNITY): Payer: Medicare Other

## 2014-06-03 ENCOUNTER — Telehealth: Payer: Self-pay | Admitting: Family Medicine

## 2014-06-03 MED ORDER — HYDROCODONE-ACETAMINOPHEN 10-325 MG PO TABS: 1.0000 | ORAL_TABLET | Freq: Four times a day (QID) | ORAL | Status: DC | PRN

## 2014-06-03 NOTE — Telephone Encounter (Signed)
Pt called and said her hip and leg were popping and that she needed to see Dr Sarajane Jews today. I offered her an appt for 06/04/14 and she refused

## 2014-06-03 NOTE — Telephone Encounter (Signed)
I am not able to see her today because I am out of the office this afternoon. I will refill her pain medication and I am happy to see her tomorrow. If this cannot wait until then, she may need to see Urgent Care to get Xrays, etc.

## 2014-06-03 NOTE — Telephone Encounter (Signed)
Patient called back about the below message and stated she is going to put her clothes on and come sit in the lobby till she is seen by Dr. Janee Morn.  She said he always works her in.

## 2014-06-03 NOTE — Telephone Encounter (Signed)
I spoke with pt and went over below advice. She is going to schedule a office visit for Friday 06/04/14.

## 2014-06-04 ENCOUNTER — Encounter: Payer: Self-pay | Admitting: Family Medicine

## 2014-06-04 ENCOUNTER — Ambulatory Visit (INDEPENDENT_AMBULATORY_CARE_PROVIDER_SITE_OTHER): Payer: Medicare Other | Admitting: Family Medicine

## 2014-06-04 VITALS — BP 98/66 | HR 103 | Temp 98.3°F

## 2014-06-04 DIAGNOSIS — M25552 Pain in left hip: Secondary | ICD-10-CM

## 2014-06-04 MED ORDER — SITAGLIPTIN PHOSPHATE 100 MG PO TABS: 100.0000 mg | ORAL_TABLET | Freq: Every day | ORAL | Status: DC

## 2014-06-04 MED ORDER — OXYCODONE-ACETAMINOPHEN 10-325 MG PO TABS: 1.0000 | ORAL_TABLET | Freq: Four times a day (QID) | ORAL | Status: DC | PRN

## 2014-06-04 MED ORDER — SOLIFENACIN SUCCINATE 10 MG PO TABS: 10.0000 mg | ORAL_TABLET | Freq: Every day | ORAL | Status: DC

## 2014-06-04 NOTE — Progress Notes (Signed)
Pre visit review using our clinic review tool, if applicable. No additional management support is needed unless otherwise documented below in the visit note. 

## 2014-06-08 ENCOUNTER — Encounter: Payer: Self-pay | Admitting: Family Medicine

## 2014-06-08 NOTE — Progress Notes (Signed)
   Subjective:    Patient ID: Jillian Bright, female    DOB: January 28, 1946, 69 y.o.   MRN: [id number]  HPI Here for a sudden worsening of left hip pain. She has rheumatoid arthritis and osteoarthritis. No recent trauma. The hip has severe pain and it pops when she walks on it.   Review of Systems  Constitutional: Negative.   Musculoskeletal: Positive for arthralgias and gait problem.       Objective:   Physical Exam  Constitutional:  Limping, using a cane  Musculoskeletal:  The left hip is not tender but ROM is limited by pain          Assessment & Plan:  Refer to Orthopedics.

## 2014-06-17 ENCOUNTER — Other Ambulatory Visit (HOSPITAL_COMMUNITY): Payer: Self-pay | Admitting: Orthopedic Surgery

## 2014-06-17 DIAGNOSIS — M5442 Lumbago with sciatica, left side: Secondary | ICD-10-CM

## 2014-06-19 ENCOUNTER — Other Ambulatory Visit: Payer: Self-pay | Admitting: Family Medicine

## 2014-07-05 ENCOUNTER — Ambulatory Visit (HOSPITAL_COMMUNITY)
Admission: RE | Admit: 2014-07-05 | Discharge: 2014-07-05 | Disposition: A | Discharge status: Home / Self Care | Payer: Medicare Other | Source: Ambulatory Visit | Attending: Orthopedic Surgery | Admitting: Orthopedic Surgery

## 2014-07-05 DIAGNOSIS — M5442 Lumbago with sciatica, left side: Secondary | ICD-10-CM | POA: Diagnosis not present

## 2014-07-05 DIAGNOSIS — Z9889 Other specified postprocedural states: Secondary | ICD-10-CM | POA: Diagnosis not present

## 2014-07-05 LAB — CREATININE, SERUM
Creatinine, Ser: 1.23 mg/dL — ABNORMAL HIGH (ref 0.50–1.10)
GFR calc non Af Amer: 43 mL/min — ABNORMAL LOW (ref >90)
GFR, EST AFRICAN AMERICAN: 50 mL/min — AB (ref >90)

## 2014-07-05 MED ORDER — GADOBENATE DIMEGLUMINE 529 MG/ML IV SOLN
20.0000 mL | Freq: Once | INTRAVENOUS | Status: AC
  Administered 2014-07-05: 20 mL via INTRAVENOUS

## 2014-07-16 ENCOUNTER — Other Ambulatory Visit (HOSPITAL_COMMUNITY): Payer: Self-pay | Admitting: Orthopedic Surgery

## 2014-07-16 NOTE — Progress Notes (Signed)
Please release orders to sign and held in Epic surgery 07-30-14 pre op 07-21-14 Thanks

## 2014-07-21 ENCOUNTER — Other Ambulatory Visit: Payer: Self-pay

## 2014-07-21 ENCOUNTER — Ambulatory Visit (HOSPITAL_COMMUNITY)
Admission: RE | Admit: 2014-07-21 | Discharge: 2014-07-21 | Disposition: A | Discharge status: Home / Self Care | Payer: Medicare Other | Source: Ambulatory Visit | Attending: Orthopedic Surgery | Admitting: Orthopedic Surgery

## 2014-07-21 ENCOUNTER — Encounter (HOSPITAL_COMMUNITY)
Admission: RE | Admit: 2014-07-21 | Discharge: 2014-07-21 | Disposition: A | Discharge status: Home / Self Care | Payer: Medicare Other | Source: Ambulatory Visit | Attending: Orthopedic Surgery | Admitting: Orthopedic Surgery

## 2014-07-21 ENCOUNTER — Encounter (HOSPITAL_COMMUNITY): Payer: Self-pay

## 2014-07-21 DIAGNOSIS — Z0183 Encounter for blood typing: Secondary | ICD-10-CM | POA: Diagnosis not present

## 2014-07-21 DIAGNOSIS — Z01818 Encounter for other preprocedural examination: Secondary | ICD-10-CM | POA: Insufficient documentation

## 2014-07-21 DIAGNOSIS — I509 Heart failure, unspecified: Secondary | ICD-10-CM | POA: Insufficient documentation

## 2014-07-21 DIAGNOSIS — R9431 Abnormal electrocardiogram [ECG] [EKG]: Secondary | ICD-10-CM | POA: Diagnosis not present

## 2014-07-21 DIAGNOSIS — I272 Other secondary pulmonary hypertension: Secondary | ICD-10-CM | POA: Insufficient documentation

## 2014-07-21 DIAGNOSIS — Z01810 Encounter for preprocedural cardiovascular examination: Secondary | ICD-10-CM | POA: Diagnosis not present

## 2014-07-21 DIAGNOSIS — G4730 Sleep apnea, unspecified: Secondary | ICD-10-CM | POA: Insufficient documentation

## 2014-07-21 DIAGNOSIS — Z01812 Encounter for preprocedural laboratory examination: Secondary | ICD-10-CM | POA: Insufficient documentation

## 2014-07-21 DIAGNOSIS — J449 Chronic obstructive pulmonary disease, unspecified: Secondary | ICD-10-CM | POA: Diagnosis not present

## 2014-07-21 DIAGNOSIS — I2510 Atherosclerotic heart disease of native coronary artery without angina pectoris: Secondary | ICD-10-CM | POA: Insufficient documentation

## 2014-07-21 DIAGNOSIS — E119 Type 2 diabetes mellitus without complications: Secondary | ICD-10-CM | POA: Diagnosis not present

## 2014-07-21 DIAGNOSIS — I252 Old myocardial infarction: Secondary | ICD-10-CM | POA: Diagnosis not present

## 2014-07-21 HISTORY — DX: Bronchitis, not specified as acute or chronic: J40

## 2014-07-21 HISTORY — DX: Pneumonia, unspecified organism: J18.9

## 2014-07-21 HISTORY — DX: Personal history of other diseases of the digestive system: Z87.19

## 2014-07-21 HISTORY — DX: Heart failure, unspecified: I50.9

## 2014-07-21 LAB — CBC
HEMATOCRIT: 40.6 % (ref 36.0–46.0)
Hemoglobin: 11.9 g/dL — ABNORMAL LOW (ref 12.0–15.0)
MCH: 24.7 pg — ABNORMAL LOW (ref 26.0–34.0)
MCHC: 29.3 g/dL — AB (ref 30.0–36.0)
MCV: 84.4 fL (ref 78.0–100.0)
PLATELETS: 201 10*3/uL (ref 150–400)
RBC: 4.81 MIL/uL (ref 3.87–5.11)
RDW: 16.5 % — AB (ref 11.5–15.5)
WBC: 8.6 10*3/uL (ref 4.0–10.5)

## 2014-07-21 LAB — BASIC METABOLIC PANEL
Anion gap: 11 (ref 5–15)
BUN: 29 mg/dL — AB (ref 6–23)
CALCIUM: 9.7 mg/dL (ref 8.4–10.5)
CO2: 34 mmol/L — AB (ref 19–32)
Chloride: 93 mmol/L — ABNORMAL LOW (ref 96–112)
Creatinine, Ser: 1.24 mg/dL — ABNORMAL HIGH (ref 0.50–1.10)
GFR calc Af Amer: 50 mL/min — ABNORMAL LOW (ref >90)
GFR, EST NON AFRICAN AMERICAN: 43 mL/min — AB (ref >90)
Glucose, Bld: 52 mg/dL — ABNORMAL LOW (ref 70–99)
POTASSIUM: 4.2 mmol/L (ref 3.5–5.1)
Sodium: 138 mmol/L (ref 135–145)

## 2014-07-21 LAB — URINALYSIS, ROUTINE W REFLEX MICROSCOPIC
BILIRUBIN URINE: NEGATIVE
GLUCOSE, UA: NEGATIVE mg/dL
HGB URINE DIPSTICK: NEGATIVE
Ketones, ur: NEGATIVE mg/dL
Leukocytes, UA: NEGATIVE
Nitrite: NEGATIVE
Protein, ur: NEGATIVE mg/dL
SPECIFIC GRAVITY, URINE: 1.01 (ref 1.005–1.030)
UROBILINOGEN UA: 0.2 mg/dL (ref 0.0–1.0)
pH: 6.5 (ref 5.0–8.0)

## 2014-07-21 LAB — APTT: aPTT: 35 seconds (ref 24–37)

## 2014-07-21 LAB — ABO/RH: ABO/RH(D): O NEG

## 2014-07-21 LAB — SURGICAL PCR SCREEN
MRSA, PCR: NEGATIVE
Staphylococcus aureus: POSITIVE — AB

## 2014-07-21 LAB — PROTIME-INR
INR: 1.11 (ref 0.00–1.49)
Prothrombin Time: 14.5 seconds (ref 11.6–15.2)

## 2014-07-21 NOTE — Progress Notes (Addendum)
No orders in at time of PAT visit 07/21/2014. Dr Randel Pigg office notified spoke with Maudie Mercury.  Spoke with Dr Marcell Barlow / anesthesia in regards to pts H&P no orders given will see pt day of surgery  ECHO per epic 01/29/2014  OV note 12/03/2013 per epic  LOV 06/23/2013 Vascular Dr Tinnie Gens per epic  LOV note Dr Aundra Dubin 01/14/2012 per epic  OV note per epic per Dr Stanford Breed 01/2014

## 2014-07-21 NOTE — Progress Notes (Addendum)
Spoke with patient by phone patient stated feeling ok.  bmet results sent to dr Belenda Cruise scott dean by epic

## 2014-07-21 NOTE — Patient Instructions (Signed)
20 Jillian Bright  07/21/2014   Your procedure is scheduled on:      Friday July 30, 2014   Report to [facility] Main Entrance and follow signs to  [place] arrive at 12:20 PM.   Call this number if you have problems the morning of surgery [phone] or Presurgical Testing [phone].   Remember:  Do not eat food After Midnight but may take clear liquid till 8:20am day of surgery, then nothing by mouth. Eat Healthy Snack night prior to surgery.   For Living Will and/or Health Care Power Attorney Forms: please provide copy for your medical record, may bring AM of surgery (forms should be already notarized-we do not provide this service).      Take these medicines the morning of surgery with A SIP OF WATER: Alprazolam (Xanax) if needed; Symbicort Inhaler (bring day of surgery); Albuterol Inhaler (bring day of surgery); Duloxetine (Cymbalta); Gabapentin (Neurotin); Omeprazole (Prilosec); Oxycodone-Acetaminophen if needed; Soothe eye gtts if needed (bring day of surgery)                                You may not have any metal on your body including hair pins and piercings  Do not wear jewelry, make-up, lotions, powders, prefumes or deodorant.  Do not shave body hair  48 hours(2 days) of CHG soap use.                Do not bring valuables to the hospital. [place].  Contacts, dentures or bridgework may not be worn into surgery.  Leave suitcase in the car. After surgery it may be brought to your room.  For patients admitted to the hospital, checkout time is 11:00 AM the day of discharge.     Special Instructions: review fact sheets for MRSA information ________________________________________________________________________  [facility] - Preparing for Surgery Before surgery, you can play an important role.  Because skin is not sterile, your skin needs to be as free of germs as possible.  You can reduce the number of germs on your  skin by washing with CHG (chlorahexidine gluconate) soap before surgery.  CHG is an antiseptic cleaner which kills germs and bonds with the skin to continue killing germs even after washing. Please DO NOT use if you have an allergy to CHG or antibacterial soaps.  If your skin becomes reddened/irritated stop using the CHG and inform your nurse when you arrive at Short Stay. Do not shave (including legs and underarms) for at least 48 hours prior to the first CHG shower.  You may shave your face/neck. Please follow these instructions carefully:  1.  Shower with CHG Soap the night before surgery and the  morning of Surgery.  2.  If you choose to wash your hair, wash your hair first as usual with your  normal  shampoo.  3.  After you shampoo, rinse your hair and body thoroughly to remove the  shampoo.                           4.  Use CHG as you would any other liquid soap.  You can apply chg directly  to the skin and wash                       Gently with a scrungie or clean  washcloth.  5.  Apply the CHG Soap to your body ONLY FROM THE NECK DOWN.   Do not use on face/ open                           Wound or open sores. Avoid contact with eyes, ears mouth and genitals (private parts).                       Wash face,  Genitals (private parts) with your normal soap.             6.  Wash thoroughly, paying special attention to the area where your surgery  will be performed.  7.  Thoroughly rinse your body with warm water from the neck down.  8.  DO NOT shower/wash with your normal soap after using and rinsing off  the CHG Soap.                9.  Pat yourself dry with a clean towel.            10.  Wear clean pajamas.            11.  Place clean sheets on your bed the night of your first shower and do not  sleep with pets. Day of Surgery : Do not apply any lotions/deodorants the morning of surgery.  Please wear clean clothes to the hospital/surgery center.  FAILURE TO FOLLOW THESE INSTRUCTIONS MAY  RESULT IN THE CANCELLATION OF YOUR SURGERY PATIENT SIGNATURE_________________________________  NURSE SIGNATURE__________________________________  ________________________________________________________________________    CLEAR LIQUID DIET   Foods Allowed                                                                     Foods Excluded  Coffee and tea, regular and decaf                             liquids that you cannot  Plain Jell-O in any flavor                                             see through such as: Fruit ices (not with fruit pulp)                                     milk, soups, orange juice  Iced Popsicles                                    All solid food Carbonated beverages, regular and diet                                    Cranberry, grape and apple juices Sports drinks like Gatorade Lightly seasoned clear broth or consume(fat free) Sugar, honey syrup  Sample Menu Breakfast  Lunch                                     Supper Cranberry juice                    Beef broth                            Chicken broth Jell-O                                     Grape juice                           Apple juice Coffee or tea                        Jell-O                                      Popsicle                                                Coffee or tea                        Coffee or tea  _____________________________________________________________________

## 2014-07-22 LAB — URINE CULTURE
Colony Count: NO GROWTH
Culture: NO GROWTH

## 2014-07-30 ENCOUNTER — Inpatient Hospital Stay (HOSPITAL_COMMUNITY): Payer: Medicare Other | Admitting: Registered Nurse

## 2014-07-30 ENCOUNTER — Encounter (HOSPITAL_COMMUNITY): Payer: Self-pay | Admitting: *Deleted

## 2014-07-30 ENCOUNTER — Inpatient Hospital Stay (HOSPITAL_COMMUNITY)
Admission: RE | Admit: 2014-07-30 | Discharge: 2014-08-02 | DRG: 470 | Disposition: A | Discharge status: Skilled Nursing Facility | Payer: Medicare Other | Source: Ambulatory Visit | Attending: Orthopedic Surgery | Admitting: Orthopedic Surgery

## 2014-07-30 ENCOUNTER — Inpatient Hospital Stay (HOSPITAL_COMMUNITY): Payer: Medicare Other

## 2014-07-30 ENCOUNTER — Encounter (HOSPITAL_COMMUNITY)
Admission: RE | Disposition: A | Discharge status: Skilled Nursing Facility | Payer: Self-pay | Source: Ambulatory Visit | Attending: Orthopedic Surgery

## 2014-07-30 DIAGNOSIS — E785 Hyperlipidemia, unspecified: Secondary | ICD-10-CM | POA: Diagnosis present

## 2014-07-30 DIAGNOSIS — I739 Peripheral vascular disease, unspecified: Secondary | ICD-10-CM | POA: Diagnosis present

## 2014-07-30 DIAGNOSIS — Z87891 Personal history of nicotine dependence: Secondary | ICD-10-CM | POA: Diagnosis not present

## 2014-07-30 DIAGNOSIS — F419 Anxiety disorder, unspecified: Secondary | ICD-10-CM | POA: Diagnosis present

## 2014-07-30 DIAGNOSIS — I2510 Atherosclerotic heart disease of native coronary artery without angina pectoris: Secondary | ICD-10-CM | POA: Diagnosis present

## 2014-07-30 DIAGNOSIS — J449 Chronic obstructive pulmonary disease, unspecified: Secondary | ICD-10-CM | POA: Diagnosis present

## 2014-07-30 DIAGNOSIS — I4510 Unspecified right bundle-branch block: Secondary | ICD-10-CM | POA: Diagnosis present

## 2014-07-30 DIAGNOSIS — M797 Fibromyalgia: Secondary | ICD-10-CM | POA: Diagnosis present

## 2014-07-30 DIAGNOSIS — Z6831 Body mass index (BMI) 31.0-31.9, adult: Secondary | ICD-10-CM

## 2014-07-30 DIAGNOSIS — M25552 Pain in left hip: Secondary | ICD-10-CM | POA: Diagnosis present

## 2014-07-30 DIAGNOSIS — K219 Gastro-esophageal reflux disease without esophagitis: Secondary | ICD-10-CM | POA: Diagnosis present

## 2014-07-30 DIAGNOSIS — G4733 Obstructive sleep apnea (adult) (pediatric): Secondary | ICD-10-CM | POA: Diagnosis present

## 2014-07-30 DIAGNOSIS — Z419 Encounter for procedure for purposes other than remedying health state, unspecified: Secondary | ICD-10-CM

## 2014-07-30 DIAGNOSIS — Z79899 Other long term (current) drug therapy: Secondary | ICD-10-CM | POA: Diagnosis not present

## 2014-07-30 DIAGNOSIS — I509 Heart failure, unspecified: Secondary | ICD-10-CM | POA: Diagnosis present

## 2014-07-30 DIAGNOSIS — M05652 Rheumatoid arthritis of left hip with involvement of other organs and systems: Principal | ICD-10-CM | POA: Diagnosis present

## 2014-07-30 DIAGNOSIS — I10 Essential (primary) hypertension: Secondary | ICD-10-CM | POA: Diagnosis present

## 2014-07-30 DIAGNOSIS — D62 Acute posthemorrhagic anemia: Secondary | ICD-10-CM | POA: Diagnosis not present

## 2014-07-30 DIAGNOSIS — K449 Diaphragmatic hernia without obstruction or gangrene: Secondary | ICD-10-CM | POA: Diagnosis present

## 2014-07-30 DIAGNOSIS — I272 Other secondary pulmonary hypertension: Secondary | ICD-10-CM | POA: Diagnosis present

## 2014-07-30 DIAGNOSIS — Z7982 Long term (current) use of aspirin: Secondary | ICD-10-CM | POA: Diagnosis not present

## 2014-07-30 DIAGNOSIS — E119 Type 2 diabetes mellitus without complications: Secondary | ICD-10-CM | POA: Diagnosis present

## 2014-07-30 DIAGNOSIS — Z9981 Dependence on supplemental oxygen: Secondary | ICD-10-CM | POA: Diagnosis not present

## 2014-07-30 DIAGNOSIS — I252 Old myocardial infarction: Secondary | ICD-10-CM | POA: Diagnosis not present

## 2014-07-30 HISTORY — PX: TOTAL HIP ARTHROPLASTY: SHX124

## 2014-07-30 LAB — GLUCOSE, CAPILLARY
GLUCOSE-CAPILLARY: 120 mg/dL — AB (ref 70–99)
GLUCOSE-CAPILLARY: 66 mg/dL — AB (ref 70–99)
Glucose-Capillary: 111 mg/dL — ABNORMAL HIGH (ref 70–99)
Glucose-Capillary: 127 mg/dL — ABNORMAL HIGH (ref 70–99)

## 2014-07-30 SURGERY — ARTHROPLASTY, HIP, TOTAL, ANTERIOR APPROACH
Anesthesia: Spinal | Site: Hip | Laterality: Left

## 2014-07-30 MED ORDER — SODIUM CHLORIDE 0.9 % IJ SOLN
INTRAMUSCULAR | Status: DC | PRN
  Administered 2014-07-30: 60 mL

## 2014-07-30 MED ORDER — METOCLOPRAMIDE HCL 5 MG/ML IJ SOLN: 5.0000 mg | Freq: Three times a day (TID) | INTRAMUSCULAR | Status: DC | PRN

## 2014-07-30 MED ORDER — SODIUM CHLORIDE 0.9 % IR SOLN
Status: DC | PRN
  Administered 2014-07-30: 2000 mL

## 2014-07-30 MED ORDER — ONDANSETRON HCL 4 MG/2ML IJ SOLN
INTRAMUSCULAR | Status: DC | PRN
  Administered 2014-07-30: 4 mg via INTRAVENOUS

## 2014-07-30 MED ORDER — POTASSIUM CHLORIDE IN NACL 20-0.9 MEQ/L-% IV SOLN
INTRAVENOUS | Status: DC
  Administered 2014-07-30: 21:00:00 via INTRAVENOUS
  Filled 2014-07-30 (×6): qty 1000

## 2014-07-30 MED ORDER — CELECOXIB 200 MG PO CAPS
200.0000 mg | ORAL_CAPSULE | Freq: Two times a day (BID) | ORAL | Status: DC
Start: 2014-07-30 — End: 2014-08-02
  Administered 2014-07-30 – 2014-08-02 (×6): 200 mg via ORAL
  Filled 2014-07-30 (×7): qty 1

## 2014-07-30 MED ORDER — PROPOFOL 10 MG/ML IV BOLUS
INTRAVENOUS | Status: AC
  Filled 2014-07-30: qty 20

## 2014-07-30 MED ORDER — GLIPIZIDE 10 MG PO TABS
10.0000 mg | ORAL_TABLET | Freq: Two times a day (BID) | ORAL | Status: DC
  Administered 2014-07-31 – 2014-08-02 (×5): 10 mg via ORAL
  Filled 2014-07-30 (×7): qty 1

## 2014-07-30 MED ORDER — MORPHINE SULFATE 2 MG/ML IJ SOLN
2.0000 mg | INTRAMUSCULAR | Status: DC | PRN
  Administered 2014-07-30 – 2014-08-01 (×3): 2 mg via INTRAVENOUS
  Filled 2014-07-30 (×3): qty 1

## 2014-07-30 MED ORDER — MEPERIDINE HCL 50 MG/ML IJ SOLN
INTRAMUSCULAR | Status: AC
  Filled 2014-07-30: qty 1

## 2014-07-30 MED ORDER — SPIRONOLACTONE 25 MG PO TABS
25.0000 mg | ORAL_TABLET | Freq: Every morning | ORAL | Status: DC
  Administered 2014-07-31 – 2014-08-01 (×2): 25 mg via ORAL
  Filled 2014-07-30 (×3): qty 1

## 2014-07-30 MED ORDER — CEFAZOLIN SODIUM-DEXTROSE 2-3 GM-% IV SOLR
2.0000 g | Freq: Three times a day (TID) | INTRAVENOUS | Status: AC
  Administered 2014-07-30 – 2014-07-31 (×2): 2 g via INTRAVENOUS
  Filled 2014-07-30 (×2): qty 50

## 2014-07-30 MED ORDER — MENTHOL 3 MG MT LOZG
1.0000 | LOZENGE | OROMUCOSAL | Status: DC | PRN
  Filled 2014-07-30: qty 9

## 2014-07-30 MED ORDER — CHLORHEXIDINE GLUCONATE 4 % EX LIQD: 60.0000 mL | Freq: Once | CUTANEOUS | Status: DC

## 2014-07-30 MED ORDER — GABAPENTIN 300 MG PO CAPS
300.0000 mg | ORAL_CAPSULE | Freq: Three times a day (TID) | ORAL | Status: DC
  Administered 2014-07-30 – 2014-08-02 (×8): 300 mg via ORAL
  Filled 2014-07-30 (×10): qty 1

## 2014-07-30 MED ORDER — FUROSEMIDE 40 MG PO TABS
40.0000 mg | ORAL_TABLET | Freq: Every morning | ORAL | Status: DC
  Administered 2014-07-31 – 2014-08-02 (×3): 40 mg via ORAL
  Filled 2014-07-30 (×3): qty 1

## 2014-07-30 MED ORDER — CEFAZOLIN SODIUM-DEXTROSE 2-3 GM-% IV SOLR
INTRAVENOUS | Status: AC
  Filled 2014-07-30: qty 50

## 2014-07-30 MED ORDER — RIVAROXABAN 10 MG PO TABS
10.0000 mg | ORAL_TABLET | Freq: Every day | ORAL | Status: DC
  Administered 2014-07-31 – 2014-08-02 (×3): 10 mg via ORAL
  Filled 2014-07-30 (×4): qty 1

## 2014-07-30 MED ORDER — ONDANSETRON HCL 4 MG/2ML IJ SOLN: 4.0000 mg | Freq: Four times a day (QID) | INTRAMUSCULAR | Status: DC | PRN

## 2014-07-30 MED ORDER — DEXTROSE 5 % IV SOLN
10.0000 mg | INTRAVENOUS | Status: DC | PRN
  Administered 2014-07-30: 25 ug/min via INTRAVENOUS

## 2014-07-30 MED ORDER — ISOPROPYL ALCOHOL 70 % SOLN
Status: AC
  Filled 2014-07-30: qty 480

## 2014-07-30 MED ORDER — DEXTROSE 50 % IV SOLN
25.0000 mL | Freq: Once | INTRAVENOUS | Status: AC
  Administered 2014-07-30: 25 mL via INTRAVENOUS

## 2014-07-30 MED ORDER — MEPERIDINE HCL 50 MG/ML IJ SOLN
6.2500 mg | INTRAMUSCULAR | Status: DC | PRN
  Administered 2014-07-30: 6.25 mg via INTRAVENOUS

## 2014-07-30 MED ORDER — SODIUM CHLORIDE 0.9 % IJ SOLN
INTRAMUSCULAR | Status: AC
  Filled 2014-07-30: qty 10

## 2014-07-30 MED ORDER — LINAGLIPTIN 5 MG PO TABS
5.0000 mg | ORAL_TABLET | Freq: Every day | ORAL | Status: DC
Start: 2014-07-30 — End: 2014-08-02
  Administered 2014-07-30 – 2014-08-02 (×4): 5 mg via ORAL
  Filled 2014-07-30 (×4): qty 1

## 2014-07-30 MED ORDER — HYDROMORPHONE HCL 1 MG/ML IJ SOLN: 0.2500 mg | INTRAMUSCULAR | Status: DC | PRN

## 2014-07-30 MED ORDER — LIDOCAINE HCL (CARDIAC) 20 MG/ML IV SOLN
INTRAVENOUS | Status: AC
  Filled 2014-07-30: qty 5

## 2014-07-30 MED ORDER — DULOXETINE HCL 60 MG PO CPEP
60.0000 mg | ORAL_CAPSULE | Freq: Every day | ORAL | Status: DC
  Administered 2014-07-30 – 2014-08-02 (×4): 60 mg via ORAL
  Filled 2014-07-30 (×4): qty 1

## 2014-07-30 MED ORDER — ONDANSETRON HCL 4 MG PO TABS: 4.0000 mg | ORAL_TABLET | Freq: Four times a day (QID) | ORAL | Status: DC | PRN

## 2014-07-30 MED ORDER — PANTOPRAZOLE SODIUM 40 MG PO TBEC
40.0000 mg | DELAYED_RELEASE_TABLET | Freq: Every day | ORAL | Status: DC
  Administered 2014-07-30 – 2014-08-02 (×4): 40 mg via ORAL
  Filled 2014-07-30 (×4): qty 1

## 2014-07-30 MED ORDER — ONDANSETRON HCL 4 MG/2ML IJ SOLN
INTRAMUSCULAR | Status: AC
  Filled 2014-07-30: qty 2

## 2014-07-30 MED ORDER — MIDAZOLAM HCL 5 MG/5ML IJ SOLN
INTRAMUSCULAR | Status: DC | PRN
  Administered 2014-07-30 (×2): 1 mg via INTRAVENOUS

## 2014-07-30 MED ORDER — SODIUM CHLORIDE 0.9 % IJ SOLN
INTRAMUSCULAR | Status: AC
  Filled 2014-07-30: qty 50

## 2014-07-30 MED ORDER — FENTANYL CITRATE 0.05 MG/ML IJ SOLN
INTRAMUSCULAR | Status: DC | PRN
  Administered 2014-07-30 (×2): 50 ug via INTRAVENOUS

## 2014-07-30 MED ORDER — BUDESONIDE-FORMOTEROL FUMARATE 160-4.5 MCG/ACT IN AERO
2.0000 | INHALATION_SPRAY | Freq: Two times a day (BID) | RESPIRATORY_TRACT | Status: DC
  Administered 2014-07-30 – 2014-08-02 (×6): 2 via RESPIRATORY_TRACT
  Filled 2014-07-30: qty 6

## 2014-07-30 MED ORDER — PROPOFOL INFUSION 10 MG/ML OPTIME
INTRAVENOUS | Status: DC | PRN
  Administered 2014-07-30: 75 ug/kg/min via INTRAVENOUS

## 2014-07-30 MED ORDER — METFORMIN HCL 500 MG PO TABS
1000.0000 mg | ORAL_TABLET | Freq: Two times a day (BID) | ORAL | Status: DC
  Administered 2014-07-31 – 2014-08-02 (×5): 1000 mg via ORAL
  Filled 2014-07-30 (×7): qty 2

## 2014-07-30 MED ORDER — ALBUTEROL SULFATE (2.5 MG/3ML) 0.083% IN NEBU: 2.0000 mL | INHALATION_SOLUTION | Freq: Four times a day (QID) | RESPIRATORY_TRACT | Status: DC | PRN

## 2014-07-30 MED ORDER — ACETAMINOPHEN 325 MG PO TABS
650.0000 mg | ORAL_TABLET | Freq: Four times a day (QID) | ORAL | Status: DC | PRN
  Administered 2014-07-31 – 2014-08-01 (×3): 650 mg via ORAL
  Filled 2014-07-30 (×3): qty 2

## 2014-07-30 MED ORDER — DEXTROSE 50 % IV SOLN
INTRAVENOUS | Status: AC
  Filled 2014-07-30: qty 50

## 2014-07-30 MED ORDER — CEFAZOLIN SODIUM-DEXTROSE 2-3 GM-% IV SOLR
2.0000 g | INTRAVENOUS | Status: AC
  Administered 2014-07-30: 2 g via INTRAVENOUS

## 2014-07-30 MED ORDER — ALPRAZOLAM 0.5 MG PO TABS
0.5000 mg | ORAL_TABLET | Freq: Three times a day (TID) | ORAL | Status: DC | PRN
  Administered 2014-07-30 – 2014-08-02 (×5): 0.5 mg via ORAL
  Filled 2014-07-30 (×5): qty 1

## 2014-07-30 MED ORDER — OXYCODONE HCL 5 MG PO TABS
5.0000 mg | ORAL_TABLET | ORAL | Status: DC | PRN
  Administered 2014-07-30 – 2014-08-02 (×13): 10 mg via ORAL
  Filled 2014-07-30 (×13): qty 2

## 2014-07-30 MED ORDER — LACTATED RINGERS IV SOLN
INTRAVENOUS | Status: DC | PRN
  Administered 2014-07-30 (×2): via INTRAVENOUS

## 2014-07-30 MED ORDER — BUPIVACAINE HCL (PF) 0.5 % IJ SOLN
INTRAMUSCULAR | Status: AC
  Filled 2014-07-30: qty 30

## 2014-07-30 MED ORDER — DARIFENACIN HYDROBROMIDE ER 7.5 MG PO TB24
7.5000 mg | ORAL_TABLET | Freq: Every day | ORAL | Status: DC
  Administered 2014-07-31 – 2014-08-02 (×3): 7.5 mg via ORAL
  Filled 2014-07-30 (×3): qty 1

## 2014-07-30 MED ORDER — ACETAMINOPHEN 650 MG RE SUPP: 650.0000 mg | Freq: Four times a day (QID) | RECTAL | Status: DC | PRN

## 2014-07-30 MED ORDER — FENTANYL CITRATE 0.05 MG/ML IJ SOLN
INTRAMUSCULAR | Status: AC
  Filled 2014-07-30: qty 2

## 2014-07-30 MED ORDER — MIDAZOLAM HCL 2 MG/2ML IJ SOLN
INTRAMUSCULAR | Status: AC
  Filled 2014-07-30: qty 2

## 2014-07-30 MED ORDER — BUPIVACAINE LIPOSOME 1.3 % IJ SUSP
20.0000 mL | Freq: Once | INTRAMUSCULAR | Status: AC
  Administered 2014-07-30: 20 mL
  Filled 2014-07-30: qty 20

## 2014-07-30 MED ORDER — BUPIVACAINE HCL (PF) 0.5 % IJ SOLN
INTRAMUSCULAR | Status: DC | PRN
  Administered 2014-07-30: 3 mL

## 2014-07-30 MED ORDER — ATORVASTATIN CALCIUM 20 MG PO TABS
20.0000 mg | ORAL_TABLET | Freq: Every day | ORAL | Status: DC
  Administered 2014-07-30 – 2014-08-02 (×4): 20 mg via ORAL
  Filled 2014-07-30 (×4): qty 1

## 2014-07-30 MED ORDER — PHENOL 1.4 % MT LIQD
1.0000 | OROMUCOSAL | Status: DC | PRN
  Filled 2014-07-30: qty 177

## 2014-07-30 MED ORDER — METOCLOPRAMIDE HCL 10 MG PO TABS: 5.0000 mg | ORAL_TABLET | Freq: Three times a day (TID) | ORAL | Status: DC | PRN

## 2014-07-30 SURGICAL SUPPLY — 47 items
APL SKNCLS STERI-STRIP NONHPOA (GAUZE/BANDAGES/DRESSINGS) ×1
BAG DECANTER FOR FLEXI CONT (MISCELLANEOUS) ×3 IMPLANT
BAG SPEC THK2 15X12 ZIP CLS (MISCELLANEOUS)
BAG ZIPLOCK 12X15 (MISCELLANEOUS) IMPLANT
BENZOIN TINCTURE PRP APPL 2/3 (GAUZE/BANDAGES/DRESSINGS) ×2 IMPLANT
BLADE SAW SGTL 18X1.27X75 (BLADE) ×2 IMPLANT
BLADE SAW SGTL 18X1.27X75MM (BLADE) ×1
CAPT HIP TOTAL 2 ×2 IMPLANT
CELLS DAT CNTRL 66122 CELL SVR (MISCELLANEOUS) ×1 IMPLANT
CLOSURE WOUND 1/2 X4 (GAUZE/BANDAGES/DRESSINGS) ×1
COVER PERINEAL POST (MISCELLANEOUS) ×3 IMPLANT
DRAPE C-ARM 42X120 X-RAY (DRAPES) ×3 IMPLANT
DRAPE STERI IOBAN 125X83 (DRAPES) ×3 IMPLANT
DRAPE U-SHAPE 47X51 STRL (DRAPES) ×9 IMPLANT
DRSG AQUACEL AG ADV 3.5X10 (GAUZE/BANDAGES/DRESSINGS) ×3 IMPLANT
DURAPREP 26ML APPLICATOR (WOUND CARE) ×3 IMPLANT
ELECT BLADE TIP CTD 4 INCH (ELECTRODE) ×3 IMPLANT
ELECT REM PT RETURN 9FT ADLT (ELECTROSURGICAL) ×3
ELECTRODE REM PT RTRN 9FT ADLT (ELECTROSURGICAL) ×1 IMPLANT
FACESHIELD WRAPAROUND (MASK) ×12 IMPLANT
FACESHIELD WRAPAROUND OR TEAM (MASK) ×4 IMPLANT
GAUZE XEROFORM 1X8 LF (GAUZE/BANDAGES/DRESSINGS) IMPLANT
GLOVE BIO SURGEON STRL SZ7.5 (GLOVE) ×3 IMPLANT
GLOVE BIOGEL PI IND STRL 8 (GLOVE) ×2 IMPLANT
GLOVE BIOGEL PI INDICATOR 8 (GLOVE) ×4
GLOVE ECLIPSE 8.0 STRL XLNG CF (GLOVE) ×3 IMPLANT
GOWN STRL REUS W/TWL XL LVL3 (GOWN DISPOSABLE) ×6 IMPLANT
HANDPIECE INTERPULSE COAX TIP (DISPOSABLE) ×3
KIT BASIN OR (CUSTOM PROCEDURE TRAY) ×3 IMPLANT
LIQUID BAND (GAUZE/BANDAGES/DRESSINGS) ×2 IMPLANT
PACK TOTAL JOINT (CUSTOM PROCEDURE TRAY) ×3 IMPLANT
PEN SKIN MARKING BROAD (MISCELLANEOUS) ×3 IMPLANT
RETRACTOR WND ALEXIS 18 MED (MISCELLANEOUS) ×1 IMPLANT
RTRCTR WOUND ALEXIS 18CM MED (MISCELLANEOUS) ×3
SET HNDPC FAN SPRY TIP SCT (DISPOSABLE) ×1 IMPLANT
STAPLER VISISTAT 35W (STAPLE) IMPLANT
STRIP CLOSURE SKIN 1/2X4 (GAUZE/BANDAGES/DRESSINGS) ×1 IMPLANT
SUT ETHIBOND NAB CT1 #1 30IN (SUTURE) ×7 IMPLANT
SUT MNCRL AB 4-0 PS2 18 (SUTURE) ×2 IMPLANT
SUT VIC AB 0 CT1 36 (SUTURE) ×3 IMPLANT
SUT VIC AB 1 CT1 36 (SUTURE) ×3 IMPLANT
SUT VIC AB 2-0 CT1 27 (SUTURE) ×6
SUT VIC AB 2-0 CT1 TAPERPNT 27 (SUTURE) ×2 IMPLANT
TOWEL OR 17X26 10 PK STRL BLUE (TOWEL DISPOSABLE) ×3 IMPLANT
TOWEL OR NON WOVEN STRL DISP B (DISPOSABLE) ×3 IMPLANT
TRAY FOLEY CATH 14FRSI W/METER (CATHETERS) ×3 IMPLANT
WATER STERILE IRR 1500ML POUR (IV SOLUTION) ×2 IMPLANT

## 2014-07-30 NOTE — Plan of Care (Signed)
Problem: Consults Goal: Diagnosis- Total Joint Replacement Left anterior hip

## 2014-07-30 NOTE — Anesthesia Postprocedure Evaluation (Signed)
  Anesthesia Post-op Note  Patient: Jillian Bright  Procedure(s) Performed: Procedure(s) (LRB): LEFT TOTAL HIP ARTHROPLASTY ANTERIOR APPROACH (Left)  Patient Location: PACU  Anesthesia Type: Spinal  Level of Consciousness: awake and alert   Airway and Oxygen Therapy: Patient Spontanous Breathing  Post-op Pain: mild  Post-op Assessment: Post-op Vital signs reviewed, Patient's Cardiovascular Status Stable, Respiratory Function Stable, Patent Airway and No signs of Nausea or vomiting  Last Vitals:  Filed Vitals:   07/30/14 1918  BP: 110/53  Pulse: 93  Temp: 36.7 C  Resp: 20    Post-op Vital Signs: stable   Complications: No apparent anesthesia complications

## 2014-07-30 NOTE — Progress Notes (Signed)
Hypoglycemic Event  CBG: 66 Treatment: D50 IV 25 mL  Symptoms: Shaky  Follow-up CBG: Time:1801  CBG Result:111  Possible Reasons for Event: Inadequate meal intake  Comments/MD notified:Denenny    Azzie Roup, Ninfa Meeker  Remember to initiate Hypoglycemia Order Set & complete

## 2014-07-30 NOTE — Anesthesia Procedure Notes (Signed)
Spinal Patient location during procedure: OR Staffing Anesthesiologist: Salley Scarlet Performed by: anesthesiologist  Preanesthetic Checklist Completed: patient identified, site marked, surgical consent, pre-op evaluation, timeout performed, IV checked, risks and benefits discussed and monitors and equipment checked Spinal Block Patient position: sitting Prep: Betadine Patient monitoring: heart rate, continuous pulse ox and blood pressure Approach: midline Location: L3-4 Injection technique: single-shot Needle Needle type: Spinocan  Needle gauge: 22 G Needle length: 9 cm Additional Notes Expiration date of kit checked and confirmed. Patient tolerated procedure well, without complications. CSF clear. No Heme. No paresthesias. Meaningful verbal contact maintained throughout spinal placement.

## 2014-07-30 NOTE — Transfer of Care (Signed)
Immediate Anesthesia Transfer of Care Note  Patient: Jillian Bright  Procedure(s) Performed: Procedure(s): LEFT TOTAL HIP ARTHROPLASTY ANTERIOR APPROACH (Left)  Patient Location: PACU  Anesthesia Type:Spinal  Level of Consciousness: awake, alert  and oriented  Airway & Oxygen Therapy: Patient Spontanous Breathing and Patient connected to face mask oxygen  Post-op Assessment: Report given to RN  Post vital signs: Reviewed and stable  Last Vitals:  Filed Vitals:   07/30/14 1252  BP: 112/70  Pulse: 101  Temp: 36.8 C  Resp: 16    Complications: No apparent anesthesia complications

## 2014-07-30 NOTE — Brief Op Note (Signed)
07/30/2014  5:22 PM  PATIENT:  Jillian Bright  69 y.o. female  PRE-OPERATIVE DIAGNOSIS:  LEFT HIP OSTEOARTHRITIS  POST-OPERATIVE DIAGNOSIS:  LEFT HIP OSTEOARTHRITIS  PROCEDURE:  Procedure(s): LEFT TOTAL HIP ARTHROPLASTY ANTERIOR APPROACH  SURGEON:  Surgeon(s): Meredith Pel, MD Mcarthur Rossetti, MD  ASSISTANT: gill clark pa  ANESTHESIA:   spinal  EBL: 400 ml    Total I/O In: 1000 [I.V.:1000] Out: 950 [Urine:450; Blood:500]  BLOOD ADMINISTERED: none  DRAINS: none   LOCAL MEDICATIONS USED:  exparel  SPECIMEN:  No Specimen  COUNTS:  YES  TOURNIQUET:  * No tourniquets in log *  DICTATION: .Other Dictation: Dictation Number [id number]  PLAN OF CARE: Admit to inpatient   PATIENT DISPOSITION:  PACU - hemodynamically stable

## 2014-07-30 NOTE — H&P (Signed)
TOTAL HIP ADMISSION H&P  Patient is admitted for left total hip arthroplasty.  Subjective:  Chief Complaint: left hip pain  HPI: Jillian Bright, 69 y.o. female, has a history of pain and functional disability in the left hip(s) due to arthritis and patient has failed non-surgical conservative treatments for greater than 12 weeks to include NSAID's and/or analgesics, corticosteriod injections, use of assistive devices and activity modification.  Onset of symptoms was gradual starting 5 years ago with rapidlly worsening course since that time.The patient noted no past surgery on the left hip(s).  Patient currently rates pain in the left hip at 10 out of 10 with activity. Patient has night pain, worsening of pain with activity and weight bearing, trendelenberg gait, pain that interfers with activities of daily living and pain with passive range of motion. Patient has evidence of subchondral cysts, subchondral sclerosis, periarticular osteophytes and joint space narrowing by imaging studies. This condition presents safety issues increasing the risk of falls. This patient has had significant worsening of symptoms in left leg and groin with negative back work up.  There is no current active infection.  Patient Active Problem List   Diagnosis Date Noted  . Weakness 01/29/2014  . Abdominal pain 01/29/2014  . Carotid stenosis 06/23/2013  . Rheumatoid arthritis 06/08/2013  . Fibromyalgia 06/08/2013  . Occlusion and stenosis of carotid artery without mention of cerebral infarction 12/02/2012  . Aortic stenosis 01/14/2012  . CAD (coronary artery disease) 11/07/2011  . Obesity hypoventilation syndrome 06/10/2009  . 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 PULMONARY HYPERTENSION 05/25/2009  . ABDOMINAL PAIN, LOWER 08/04/2008  . DIABETES MELLITUS, TYPE II 08/18/2007  . CONGESTIVE HEART FAILURE 08/18/2007  . Other and Unspecified Hyperlipidemia 08/04/2007  . Generalized anxiety disorder 08/04/2007  .  HYPERTENSION 08/04/2007  . GERD 08/04/2007  . DEGENERATIVE JOINT DISEASE 08/04/2007  . DEPENDENT EDEMA 08/04/2007   Past Medical History  Diagnosis Date  . Diabetes mellitus   . COPD (chronic obstructive pulmonary disease)     sees Dr. [name]  . Sleep apnea, obstructive   . Pulmonary HTN   . Obesity (BMI 30.0-34.9)   . Hyperlipidemia   . Myocardial infarction   . Coronary artery disease   . Anxiety   . Shortness of breath   . GERD (gastroesophageal reflux disease)   . Arthritis     rheumatoid, sees Dr. Tobie Lords  . CHF (congestive heart failure)   . Pneumonia   . Bronchitis     hx of   . History of hiatal hernia     Past Surgical History  Procedure Laterality Date  . Appendectomy    . Tonsillectomy and adenoidectomy    . Lumbar disc surgery    . Hemorrhoid surgery    . Coronary angioplasty    . Endarterectomy Left 12/04/2012    Procedure: ENDARTERECTOMY CAROTID;  Surgeon: Mal Misty, MD;  Location: [facility];  Service: Vascular;  Laterality: Left;  . Patch angioplasty Left 12/04/2012    Procedure: PATCH ANGIOPLASTY;  Surgeon: Mal Misty, MD;  Location: [facility];  Service: Vascular;  Laterality: Left;  Marland Kitchen Eye surgery      bilat cataract surg   . Colonscopy       Prescriptions prior to admission  Medication Sig Dispense Refill Last Dose  . albuterol (PROVENTIL HFA;VENTOLIN HFA) 108 (90 BASE) MCG/ACT inhaler Inhale 2 puffs into the lungs every 6 (six) hours as needed for wheezing or shortness of breath.   Past Month  at Unknown time  . ALPRAZolam (XANAX) 0.5 MG tablet Take 1 tablet (0.5 mg total) by mouth 3 (three) times daily as needed for anxiety. 30 tablet 0 Past Week at Unknown time  . aspirin EC 81 MG tablet Take 81 mg by mouth every morning.    07/29/2014 at 0800  . atorvastatin (LIPITOR) 20 MG tablet Take 1 tablet (20 mg total) by mouth daily. (Patient taking differently: Take 20 mg by mouth at bedtime. ) 90 tablet 3 07/29/2014 at 0800  . budesonide-formoterol  (SYMBICORT) 160-4.5 MCG/ACT inhaler Inhale 2 puffs into the lungs 2 (two) times daily. 1 Inhaler 5 07/29/2014 at 0800  . celecoxib (CELEBREX) 200 MG capsule Take 1 capsule (200 mg total) by mouth 2 (two) times daily. 60 capsule 11 07/29/2014 at 0800  . DULoxetine (CYMBALTA) 60 MG capsule Take 1 capsule (60 mg total) by mouth daily. 30 capsule 11 07/29/2014 at 0800  . furosemide (LASIX) 40 MG tablet Take 40 mg by mouth every morning.    07/29/2014 at 0800  . gabapentin (NEURONTIN) 300 MG capsule Take 1 capsule (300 mg total) by mouth 3 (three) times daily. 90 capsule 5 07/29/2014 at 2200  . glipiZIDE (GLUCOTROL) 10 MG tablet TAKE 1 TABLET (10 MG TOTAL) BY MOUTH 2 (TWO) TIMES DAILY BEFORE A MEAL. 60 tablet 10 07/29/2014 at 0800  . glucose blood (ACCU-CHEK AVIVA PLUS) test strip Test once per day and diagnosis code is 250.00 (Patient taking differently: 1 each by Other route every morning. Test once per day and diagnosis code is 250.00) 100 each 3 Taking  . HYDROcodone-acetaminophen (NORCO) 10-325 MG per tablet Take 1 tablet by mouth every 6 (six) hours as needed for severe pain (pain). 120 tablet 0 Taking  . metFORMIN (GLUCOPHAGE) 1000 MG tablet Take 1,000 mg by mouth 2 (two) times daily with a meal.   07/29/2014 at Unknown time  . omeprazole (PRILOSEC) 40 MG capsule Take 1 capsule (40 mg total) by mouth daily before breakfast. 90 capsule 3 07/29/2014 at 0800  . OVER THE COUNTER MEDICATION Place 1-2 drops into both eyes daily as needed (dry eyes.). Soothe.     . oxyCODONE-acetaminophen (PERCOCET) 10-325 MG per tablet Take 1 tablet by mouth every 6 (six) hours as needed for pain. 120 tablet 0 07/29/2014 at 1600  . sitaGLIPtin (JANUVIA) 100 MG tablet Take 1 tablet (100 mg total) by mouth daily. 90 tablet 3 07/29/2014 at 0800  . solifenacin (VESICARE) 10 MG tablet Take 1 tablet (10 mg total) by mouth daily. 90 tablet 3 07/29/2014 at 0800  . spironolactone (ALDACTONE) 25 MG tablet Take 25 mg by mouth every morning.    07/29/2014 at  2200   Allergies  Allergen Reactions  . Codeine Itching    Takes vicodin at home    History  Substance Use Topics  . Smoking status: Former Smoker -- 2.00 packs/day for 36 years    Types: Cigarettes    Quit date: 02/02/2013  . Smokeless tobacco: Never Used     Comment: had restarted and quit again 01/27/12   . Alcohol Use: 0.0 oz/week    0 Standard drinks or equivalent per week     Comment: rare    Family History  Problem Relation Age of Onset  . Hypertension Mother   . Hyperlipidemia Mother   . Stroke Mother   . Heart attack Mother   . Cancer Mother   . Heart attack Father   . Cancer Brother   . Asthma  Maternal Grandfather      ROS  Objective:  Physical Exam  Vital signs in last 24 hours: Temp:  [98.3 F (36.8 C)] 98.3 F (36.8 C) (03/04 1252) Pulse Rate:  [101] 101 (03/04 1252) Resp:  [16] 16 (03/04 1252) BP: (112)/(70) 112/70 mmHg (03/04 1252) Weight:  [89.177 kg (196 lb 9.6 oz)] 89.177 kg (196 lb 9.6 oz) (03/04 1252)  Labs:   Estimated body mass index is 31.75 kg/(m^2) as calculated from the following:   Height as of this encounter: 5\' 6"  (1.676 m).   Weight as of this encounter: 89.177 kg (196 lb 9.6 oz).   Imaging Review Plain radiographs demonstrate severe degenerative joint disease of the left hip(s). The bone quality appears to be fair for age and reported activity level.  Assessment/Plan:  End stage arthritis, left hip(s)  The patient history, physical examination, clinical judgement of the provider and imaging studies are consistent with end stage degenerative joint disease of the left hip(s) and total hip arthroplasty is deemed medically necessary. The treatment options including medical management, injection therapy, arthroscopy and arthroplasty were discussed at length. The risks and benefits of total hip arthroplasty were presented and reviewed. The risks due to aseptic loosening, infection, stiffness, dislocation/subluxation,  thromboembolic  complications and other imponderables were discussed.  The patient acknowledged the explanation, agreed to proceed with the plan and consent was signed. Patient is being admitted for inpatient treatment for surgery, pain control, PT, OT, prophylactic antibiotics, VTE prophylaxis, progressive ambulation and ADL's and discharge planning.The patient is planning to be discharged to skilled nursing facility

## 2014-07-30 NOTE — Anesthesia Preprocedure Evaluation (Addendum)
Anesthesia Evaluation  Patient identified by MRN, date of birth, ID band Patient awake    Reviewed: Allergy & Precautions, NPO status , Patient's Chart, lab work & pertinent test results  Airway Mallampati: II  TM Distance: >3 FB Neck ROM: Full    Dental no notable dental hx.    Pulmonary shortness of breath, sleep apnea , pneumonia -, resolved, COPD COPD inhaler and oxygen dependent, former smoker,  Oxygen 3 L/minute during the day, and 4 L/minute at night.. Sees Dr. Halford Chessman. breath sounds clear to auscultation  Pulmonary exam normal       Cardiovascular hypertension, Pt. on medications + CAD, + Past MI, + Peripheral Vascular Disease and +CHF + Valvular Problems/Murmurs AS Rhythm:Regular Rate:Normal  ECG: NSR, RBBB  ECHO reviewed: Moderate aortic stenosis   Neuro/Psych PSYCHIATRIC DISORDERS Anxiety  Neuromuscular disease    GI/Hepatic Neg liver ROS, hiatal hernia, GERD-  Medicated,  Endo/Other  diabetes, Type 2, Oral Hypoglycemic Agents  Renal/GU negative Renal ROS  negative genitourinary   Musculoskeletal  (+) Arthritis -, Fibromyalgia -  Abdominal (+) + obese,   Peds negative pediatric ROS (+)  Hematology negative hematology ROS (+)   Anesthesia Other Findings   Reproductive/Obstetrics negative OB ROS                          Anesthesia Physical Anesthesia Plan  ASA: IV  Anesthesia Plan: Spinal   Post-op Pain Management:    Induction: Intravenous  Airway Management Planned:   Additional Equipment:   Intra-op Plan:   Post-operative Plan: Extubation in OR  Informed Consent: I have reviewed the patients History and Physical, chart, labs and discussed the procedure including the risks, benefits and alternatives for the proposed anesthesia with the patient or authorized representative who has indicated his/her understanding and acceptance.   Dental advisory given  Plan Discussed  with: CRNA  Anesthesia Plan Comments: (Discussed spinal and general. Moderate aortic stenosis. Plan spinal. Arterial line. Discussed risks/benefits of spinal including headache, backache, failure, bleeding, infection, and nerve damage. Patient consents to spinal. Questions answered. Coagulation studies and platelet count acceptable.)     Anesthesia Quick Evaluation

## 2014-07-31 LAB — GLUCOSE, CAPILLARY
GLUCOSE-CAPILLARY: 123 mg/dL — AB (ref 70–99)
GLUCOSE-CAPILLARY: 150 mg/dL — AB (ref 70–99)
Glucose-Capillary: 147 mg/dL — ABNORMAL HIGH (ref 70–99)

## 2014-07-31 LAB — BASIC METABOLIC PANEL
Anion gap: 5 (ref 5–15)
BUN: 15 mg/dL (ref 6–23)
CALCIUM: 8.2 mg/dL — AB (ref 8.4–10.5)
CO2: 33 mmol/L — AB (ref 19–32)
CREATININE: 0.8 mg/dL (ref 0.50–1.10)
Chloride: 102 mmol/L (ref 96–112)
GFR, EST AFRICAN AMERICAN: 85 mL/min — AB (ref >90)
GFR, EST NON AFRICAN AMERICAN: 73 mL/min — AB (ref >90)
Glucose, Bld: 112 mg/dL — ABNORMAL HIGH (ref 70–99)
POTASSIUM: 3.8 mmol/L (ref 3.5–5.1)
Sodium: 140 mmol/L (ref 135–145)

## 2014-07-31 LAB — CBC
HCT: 27.5 % — ABNORMAL LOW (ref 36.0–46.0)
Hemoglobin: 8.2 g/dL — ABNORMAL LOW (ref 12.0–15.0)
MCH: 25.5 pg — ABNORMAL LOW (ref 26.0–34.0)
MCHC: 29.8 g/dL — AB (ref 30.0–36.0)
MCV: 85.7 fL (ref 78.0–100.0)
Platelets: 150 10*3/uL (ref 150–400)
RBC: 3.21 MIL/uL — ABNORMAL LOW (ref 3.87–5.11)
RDW: 16.4 % — ABNORMAL HIGH (ref 11.5–15.5)
WBC: 5.7 10*3/uL (ref 4.0–10.5)

## 2014-07-31 MED ORDER — LIP MEDEX EX OINT
TOPICAL_OINTMENT | CUTANEOUS | Status: AC
Start: 2014-07-31 — End: 2014-07-31
  Filled 2014-07-31: qty 7

## 2014-07-31 MED ORDER — METHOCARBAMOL 500 MG PO TABS
500.0000 mg | ORAL_TABLET | Freq: Four times a day (QID) | ORAL | Status: DC | PRN
Start: 2014-07-31 — End: 2014-08-02
  Administered 2014-07-31 – 2014-08-02 (×4): 500 mg via ORAL
  Filled 2014-07-31 (×5): qty 1

## 2014-07-31 NOTE — Progress Notes (Signed)
Physical Therapy Treatment Patient Details Name: Jillian Bright MRN: [id number] DOB: 11-22-1945 Today's Date: 08-14-14    History of Present Illness L THR    PT Comments      Follow Up Recommendations  SNF     Equipment Recommendations  None recommended by PT    Recommendations for Other Services OT consult     Precautions / Restrictions Precautions Precautions: Fall Precaution Comments: on home O2 Restrictions Weight Bearing Restrictions: No Other Position/Activity Restrictions: WBAT    Mobility  Bed Mobility Overal bed mobility: Needs Assistance Bed Mobility: Supine to Sit;Sit to Supine     Supine to sit: Mod assist Sit to supine: +2 for physical assistance;Mod assist   General bed mobility comments: cues for sequence and use of R LE to self assist; physical assist to manage bilat LEs and to control trunk  Transfers Overall transfer level: Needs assistance Equipment used: Rolling walker (2 wheeled) Transfers: Sit to/from [facility] Sit to Stand: Mod assist;+2 physical assistance;+2 safety/equipment Stand pivot transfers: Mod assist;+2 physical assistance       General transfer comment: cues for LE management and use of UEs to self assist  Ambulation/Gait Ambulation/Gait assistance: Mod assist;+2 physical assistance;+2 safety/equipment Ambulation Distance (Feet): 3 Feet Assistive device: Rolling walker (2 wheeled) Gait Pattern/deviations: Step-to pattern;Decreased step length - right;Decreased step length - left;Shuffle;Trunk flexed Gait velocity: decr   General Gait Details: cues for sequence, posture and position from [employer]            Wheelchair Mobility    Modified Rankin (Stroke Patients Only)       Balance                                    Cognition Arousal/Alertness: Awake/alert Behavior During Therapy: WFL for tasks assessed/performed Overall Cognitive Status: Within Functional Limits  for tasks assessed                      Exercises      General Comments        Pertinent Vitals/Pain Pain Assessment: 0-10 Pain Score: 6  Pain Location: L hip/thigh Pain Descriptors / Indicators: Aching;Sore Pain Intervention(s): Limited activity within patient's tolerance;Monitored during session;Ice applied    Home Living                      Prior Function            PT Goals (current goals can now be found in the care plan section) Acute Rehab PT Goals Patient Stated Goal: Rehab at [facility] and then home PT Goal Formulation: With patient Time For Goal Achievement: 08/07/14 Potential to Achieve Goals: Good Progress towards PT goals: Progressing toward goals    Frequency  7X/week    PT Plan Current plan remains appropriate    Co-evaluation             End of Session Equipment Utilized During Treatment: Gait belt;Oxygen Activity Tolerance: Patient limited by pain Patient left: in bed;with call bell/phone within reach     Time: 1630-1656 PT Time Calculation (min) (ACUTE ONLY): 26 min  Charges:  $Therapeutic Activity: 8-22 mins                    G Codes:      Saveah Bahar 08/14/2014, 5:54 PM

## 2014-07-31 NOTE — Care Management Note (Signed)
CARE MANAGEMENT NOTE 07/31/2014  Patient:  Jillian Bright, Jillian Bright   Account Number:  [id number]  Date Initiated:  07/31/2014  Documentation initiated by:  Apolonio Schneiders  Subjective/Objective Assessment:   LEFT TOTAL HIP ARTHROPLASTY ANTERIOR APPROACH (Left)     Action/Plan:   Anticipated DC Date:     Anticipated DC Plan:  SKILLED NURSING FACILITY  In-house referral  Clinical Social Worker         Choice offered to / List presented to:             Status of service:  Completed, signed off Medicare Important Message given?   (If response is "NO", the following Medicare IM given date fields will be blank) Date Medicare IM given:   Medicare IM given by:   Date Additional Medicare IM given:   Additional Medicare IM given by:    Discharge Disposition:    Per UR Regulation:    If discussed at Long Length of Stay Meetings, dates discussed:    Comments:  07/31/14 1035 - CM spoke with patient at the bedside. Plans to go to [facility] for rehab at discharge. Lives at home alone. Has a shower chair and rolling walker at home. Venita Sheffield RN BSN CCM [phone]

## 2014-07-31 NOTE — Progress Notes (Signed)
Pt stable Mobilizing with PT Pain mod well controlled - was on oxy pre op May need 1 u prbc for hgb less than 8 for Sun am Plan [facility] rehab mon/tues

## 2014-07-31 NOTE — Progress Notes (Signed)
Physical Therapy Treatment Patient Details Name: Jillian Bright MRN: [id number] DOB: 1945/10/02 Today's Date: 08/14/2014    History of Present Illness L THR    PT Comments    Pain ltd this pm.    Follow Up Recommendations  SNF     Equipment Recommendations  None recommended by PT    Recommendations for Other Services OT consult     Precautions / Restrictions Precautions Precautions: Fall Precaution Comments: on home O2 Restrictions Weight Bearing Restrictions: No Other Position/Activity Restrictions: WBAT    Mobility  Bed Mobility Overal bed mobility: Needs Assistance Bed Mobility: Sit to Supine       Sit to supine: +2 for physical assistance;Mod assist   General bed mobility comments: cues for sequence and use of R LE to self assist; physical assist to manage bilat LEs and to control trunk  Transfers Overall transfer level: Needs assistance Equipment used: Rolling walker (2 wheeled) Transfers: Sit to/from Stand Sit to Stand: Mod assist;+2 physical assistance;+2 safety/equipment         General transfer comment: cues for LE management and use of UEs to self assist  Ambulation/Gait Ambulation/Gait assistance: Mod assist Ambulation Distance (Feet): 5 Feet Assistive device: Rolling walker (2 wheeled) Gait Pattern/deviations: Step-to pattern;Decreased step length - right;Decreased step length - left;Shuffle;Trunk flexed Gait velocity: decr   General Gait Details: cues for sequence, posture and position from [employer]            Wheelchair Mobility    Modified Rankin (Stroke Patients Only)       Balance                                    Cognition Arousal/Alertness: Awake/alert Behavior During Therapy: WFL for tasks assessed/performed Overall Cognitive Status: Within Functional Limits for tasks assessed                      Exercises      General Comments        Pertinent Vitals/Pain Pain Assessment:  0-10 Pain Score: 6  Pain Location: L hip/thigh Pain Descriptors / Indicators: Aching;Sore Pain Intervention(s): Limited activity within patient's tolerance;Monitored during session;Premedicated before session;Ice applied    Home Living                      Prior Function            PT Goals (current goals can now be found in the care plan section) Acute Rehab PT Goals Patient Stated Goal: Rehab at [facility] and then home PT Goal Formulation: With patient Time For Goal Achievement: 08/07/14 Potential to Achieve Goals: Good Progress towards PT goals: Progressing toward goals    Frequency  7X/week    PT Plan Current plan remains appropriate    Co-evaluation             End of Session Equipment Utilized During Treatment: Gait belt;Oxygen Activity Tolerance: Patient tolerated treatment well Patient left: in bed;in CPM;with nursing/sitter in room     Time: 9323-5573 PT Time Calculation (min) (ACUTE ONLY): 18 min  Charges:  $Therapeutic Activity: 8-22 mins                    G Codes:      Jillian Bright 08/14/2014, 3:43 PM

## 2014-07-31 NOTE — Op Note (Signed)
NAME:  Jillian, Bright NO.:  [id number]  MEDICAL RECORD NO.:  [id number]  LOCATION:  9518                         FACILITY:  [facility]  PHYSICIAN:  Anderson Malta, M.D.    DATE OF BIRTH:  20-Jun-1945  DATE OF PROCEDURE: DATE OF DISCHARGE:                              OPERATIVE REPORT   PREOPERATIVE DIAGNOSIS:  Left hip arthritis.  POSTOPERATIVE DIAGNOSIS:  Left hip arthritis.  PROCEDURE:  Left total hip replacement using DePuy Corail size 14 stem +5 ceramic call, 50 mm pinnacle cup with 1 screw.  SURGEON:  Anderson Malta, M.D.  ASSISTANT:  Lind Guest. Ninfa Linden, M.D.  ANESTHESIA:  Spinal.  ESTIMATED BLOOD LOSS:  400.  INDICATIONS:  Jillian Bright is a 70 year old patient with end-stage hip arthritis, presents for operative management after explanation of risks and benefits.  PROCEDURE IN DETAIL:  The patient was brought to the operating room where spinal anesthetic was induced.  Preoperative antibiotics administered.  Time-out was called.  The patient was placed on Hana bed and leg lengths were checked.  Pre-surgical fluoroscopic image was obtained.  At this time, area was prescrubbed with alcohol and Betadine and allowed to air dry.  Prepped with DuraPrep solution, draped in sterile manner.  Charlie Pitter was then used to cover the operative field.  Time- out was called.  An incision was made just about 2 cm lateral and distal to the anterior superior iliac crest.  Skin and subcutaneous tissue were sharply divided.  The fascia over the tensor fascia lata was identified. Self-retaining clear circular retractor was placed.  Fascia lata was incised lifting with a Cobb.  The muscle was then bluntly dissected off the under surface.  Tensor fascia lata was then mobilized laterally. Cobra was placed along the superior femoral neck.  At this time, the hip was placed to retract the rectus and the circumflex vessels were cauterized.  At this time, the Cobra retractor was  placed under the inferior femoral neck.  Soft tissue was then removed from the anterior part of the capsule.  The capsule was then incised beginning at the acetabulum extending parallel to the femoral neck and then extending medially across the intertrochanteric line.  Capsule was tagged.  Cobb elevators used to elevate the capsule.  Leg was externally rotated to 60 degrees and capsular dissection was taken down to the lesser trochanter. At this time, retractors were placed on the inside of the capsule.  The femoral neck cut was made in accordance with preoperative templating under fluoroscopic guidance.  This was completed with an osteotome near the shoulder of the trochanter.  At this time, corkscrew was placed into the femoral head, which was removed.  Right angle retractor was then placed at the 7 o'clock position on the acetabulum between the capsule and the labrum.  Posterior retractor was then placed at about the 5 o'clock position.  Labrum was excised.  Transverse acetabular ligament was removed.  Soft tissue was removed.  At this time, medialization with a 42 reamer was performed.  This was taken down to, but not through the inner table.  Reaming was then performed just superior to the fovea and taken up to a  size 50.  Cup was then impacted after irrigating the acetabular bleeding bony surface.  Approximately 40 degrees of abduction and 10 degrees of anteversion was obtained.  A +4 liner was placed.  At this time, attention was directed towards the femur.  Mueller retractor was placed.  This was placed on the posterior neck.  Leg was externally rotated and adducted and the hook was placed under the femur.  The lateral capsule was then tagged and dissected in order to allow release of the conjoined tendon and the piriformis tendon.  Obturator externus did not require release.  Femoral exposure was achieved.  Rongeur was used to lateralize the starting point.  Broaching was then  performed up to a size 12.  Trial reduction was performed with +1.5 ball.  Under fluoroscopy, leg length was noted to be short.  It was determined at this time to go up to a size 14 Corail stem trial, which was trialed approximately the length of the leg length discrepancy.  This was then trialed with a 1.5 and a +5 ball.  Under fluoroscopic examination, leg lengths were equal with the +5 ball.  Good soft tissue tension was achieved.  Leg was stable with both internal rotation at 40 degrees and external rotation to 100.  Trial ball was removed.  The stem broach was removed.  Irrigation was performed and the true stem was placed.  A +5 ball was then placed in equal leg lengths and good soft tissue tension. At this time, thorough irrigation was performed.  Capsule was closed using #1 Ethibond.  Exparel was placed.  Fascia lata was then closed using #1 Vicryl suture followed by interrupted inverted 0 Vicryl suture, 2-0 Vicryl suture, and 4-0 Monocryl with Dermabond and an Aquacel dressing.  The patient tolerated the procedure well without immediate complications.  Lind Guest. Ninfa Linden, M.D. assistance required at all times for retraction and protection of neurovascular structures along with the assistance of Erskine Emery, Loraine Grip, M.D.     GSD/MEDQ  D:  07/30/2014  T:  07/31/2014  Job:  [id number]

## 2014-07-31 NOTE — Progress Notes (Signed)
OT Note  Patient Details Name: Jillian Bright MRN: [id number] DOB: Sep 11, 1945   Cancelled Treatment:    Reason Eval/Treat Not Completed: Other (comment) -- All OT needs can be met in next venue of care -- SNF. Will defer to OT at SNF.  Desarai Barrack A 07/31/2014, 12:56 PM

## 2014-07-31 NOTE — Discharge Instructions (Signed)
Information on my medicine - XARELTO® (Rivaroxaban) ° °This medication education was reviewed with me or my healthcare representative as part of my discharge preparation.  The pharmacist that spoke with me during my hospital stay was:  Chelci Wintermute Robert, RPH ° °Why was Xarelto® prescribed for you? °Xarelto® was prescribed for you to reduce the risk of blood clots forming after orthopedic surgery. The medical term for these abnormal blood clots is venous thromboembolism (VTE). ° °What do you need to know about xarelto® ? °Take your Xarelto® ONCE DAILY at the same time every day. °You may take it either with or without food. ° °If you have difficulty swallowing the tablet whole, you may crush it and mix in applesauce just prior to taking your dose. ° °Take Xarelto® exactly as prescribed by your doctor and DO NOT stop taking Xarelto® without talking to the doctor who prescribed the medication.  Stopping without other VTE prevention medication to take the place of Xarelto® may increase your risk of developing a clot. ° °After discharge, you should have regular check-up appointments with your healthcare provider that is prescribing your Xarelto®.   ° °What do you do if you miss a dose? °If you miss a dose, take it as soon as you remember on the same day then continue your regularly scheduled once daily regimen the next day. Do not take two doses of Xarelto® on the same day.  ° °Important Safety Information °A possible side effect of Xarelto® is bleeding. You should call your healthcare provider right away if you experience any of the following: °? Bleeding from an injury or your nose that does not stop. °? Unusual colored urine (red or dark brown) or unusual colored stools (red or black). °? Unusual bruising for unknown reasons. °? A serious fall or if you hit your head (even if there is no bleeding). ° °Some medicines may interact with Xarelto® and might increase your risk of bleeding while on Xarelto®. To help  avoid this, consult your healthcare provider or pharmacist prior to using any new prescription or non-prescription medications, including herbals, vitamins, non-steroidal anti-inflammatory drugs (NSAIDs) and supplements. ° °This website has more information on Xarelto®: www.xarelto.com. ° ° ° °

## 2014-07-31 NOTE — Progress Notes (Signed)
Pt & belongings moved to 1532. No changes in assessment. Ezzie Senat, [employer]

## 2014-07-31 NOTE — Evaluation (Signed)
Physical Therapy Evaluation Patient Details Name: Jillian Bright MRN: [id number] DOB: 06-12-1945 Today's Date: 07/31/2014   History of Present Illness  L THR  Clinical Impression  Pt s/p L THR presents with decreased L LE strength/ROM, post op pain and premorbid deconditioning limiting functional mobility.  Pt would benefit from follow up rehab at SNF level to maximize IND and safety prior to return home with ltd assist    Follow Up Recommendations SNF    Equipment Recommendations  None recommended by PT    Recommendations for Other Services OT consult     Precautions / Restrictions Precautions Precautions: Fall Precaution Comments: on home O2 Restrictions Weight Bearing Restrictions: No Other Position/Activity Restrictions: WBAT      Mobility  Bed Mobility Overal bed mobility: Needs Assistance Bed Mobility: Supine to Sit     Supine to sit: Mod assist     General bed mobility comments: cues for sequence and use of R LE to self assist  Transfers Overall transfer level: Needs assistance Equipment used: Rolling walker (2 wheeled) Transfers: Sit to/from Stand Sit to Stand: From elevated surface;Mod assist         General transfer comment: cues for LE management and use of UEs to self assist  Ambulation/Gait Ambulation/Gait assistance: Mod assist Ambulation Distance (Feet): 13 Feet Assistive device: Rolling walker (2 wheeled) Gait Pattern/deviations: Step-to pattern;Decreased step length - right;Decreased step length - left;Shuffle;Trunk flexed Gait velocity: decr   General Gait Details: cues for sequence, posture and position from [employer]            Wheelchair Mobility    Modified Rankin (Stroke Patients Only)       Balance                                             Pertinent Vitals/Pain Pain Assessment: 0-10 Pain Score: 5  Pain Location: L hip/thigh Pain Descriptors / Indicators: Aching;Sore Pain Intervention(s):  Limited activity within patient's tolerance;Monitored during session;Premedicated before session;Ice applied    Home Living Family/patient expects to be discharged to:: Skilled nursing facility Living Arrangements: Alone                    Prior Function Level of Independence: Independent with assistive device(s)         Comments: ltd by RA     Hand Dominance   Dominant Hand: Right    Extremity/Trunk Assessment   Upper Extremity Assessment: Overall WFL for tasks assessed           Lower Extremity Assessment: LLE deficits/detail   LLE Deficits / Details: Hip strength 2/5 with AAROM at hip to 80 flex and 15 abd     Communication   Communication: No difficulties  Cognition Arousal/Alertness: Awake/alert Behavior During Therapy: WFL for tasks assessed/performed Overall Cognitive Status: Within Functional Limits for tasks assessed                      General Comments      Exercises Total Joint Exercises Ankle Circles/Pumps: AROM;Both;15 reps;Supine Quad Sets: AROM;Both;10 reps;Supine Heel Slides: AAROM;Left;15 reps;Supine Hip ABduction/ADduction: AAROM;Left;10 reps;Supine      Assessment/Plan    PT Assessment Patient needs continued PT services  PT Diagnosis Difficulty walking   PT Problem List Decreased strength;Decreased range of motion;Decreased activity tolerance;Decreased mobility;Decreased knowledge of use of DME;Pain;Obesity  PT Treatment Interventions DME instruction;Gait training;Stair training;Functional mobility training;Therapeutic activities;Therapeutic exercise;Patient/family education   PT Goals (Current goals can be found in the Care Plan section) Acute Rehab PT Goals Patient Stated Goal: Rehab at [facility] and then home PT Goal Formulation: With patient Time For Goal Achievement: 08/07/14 Potential to Achieve Goals: Good    Frequency 7X/week   Barriers to discharge        Co-evaluation               End of  Session Equipment Utilized During Treatment: Gait belt;Oxygen Activity Tolerance: Patient tolerated treatment well Patient left: in chair;with call bell/phone within reach;with family/visitor present Nurse Communication: Mobility status         Time: 8592-7639 PT Time Calculation (min) (ACUTE ONLY): 50 min   Charges:   PT Evaluation $Initial PT Evaluation Tier I: 1 Procedure PT Treatments $Gait Training: 8-22 mins $Therapeutic Exercise: 8-22 mins   PT G Codes:        Emmie Frakes 2014/08/22, 12:50 PM

## 2014-07-31 NOTE — Progress Notes (Signed)
Subjective: 1 Day Post-Op Procedure(s) (LRB): LEFT TOTAL HIP ARTHROPLASTY ANTERIOR APPROACH (Left) Patient reports pain as moderate.  Acute blood loss anemia from surgery, but vitals stable and not light-headed.  Objective: Vital signs in last 24 hours: Temp:  [97.5 F (36.4 C)-99.3 F (37.4 C)] 97.5 F (36.4 C) (03/05 0509) Pulse Rate:  [74-101] 95 (03/05 0509) Resp:  [13-20] 16 (03/05 0509) BP: (101-143)/(51-77) 101/59 mmHg (03/05 0509) SpO2:  [92 %-100 %] 97 % (03/05 0509) Weight:  [88.905 kg (196 lb)-89.177 kg (196 lb 9.6 oz)] 88.905 kg (196 lb) (03/04 1815)  Intake/Output from previous day: 03/04 0701 - 03/05 0700 In: 2630 [P.O.:720; I.V.:1860; IV Piggyback:50] Out: 3000 [Urine:2500; Blood:500] Intake/Output this shift: Total I/O In: 397.5 [I.V.:347.5; IV Piggyback:50] Out: -    Recent Labs  07/31/14 0442  HGB 8.2*    Recent Labs  07/31/14 0442  WBC 5.7  RBC 3.21*  HCT 27.5*  PLT 150    Recent Labs  07/31/14 0442  NA 140  K 3.8  CL 102  CO2 33*  BUN 15  CREATININE 0.80  GLUCOSE 112*  CALCIUM 8.2*   No results for input(s): LABPT, INR in the last 72 hours.  Sensation intact distally Intact pulses distally Dorsiflexion/Plantar flexion intact Incision: dressing C/D/I  Assessment/Plan: 1 Day Post-Op Procedure(s) (LRB): LEFT TOTAL HIP ARTHROPLASTY ANTERIOR APPROACH (Left) Up with therapy  May need transfusion if H/H drops further and becomes symptomatic. SNF first of the week.  Jillian Bright Y 07/31/2014, 9:05 AM

## 2014-08-01 LAB — CBC
HCT: 27.8 % — ABNORMAL LOW (ref 36.0–46.0)
Hemoglobin: 8.1 g/dL — ABNORMAL LOW (ref 12.0–15.0)
MCH: 24.8 pg — ABNORMAL LOW (ref 26.0–34.0)
MCHC: 29.1 g/dL — ABNORMAL LOW (ref 30.0–36.0)
MCV: 85 fL (ref 78.0–100.0)
PLATELETS: 147 10*3/uL — AB (ref 150–400)
RBC: 3.27 MIL/uL — AB (ref 3.87–5.11)
RDW: 16.6 % — AB (ref 11.5–15.5)
WBC: 6.2 10*3/uL (ref 4.0–10.5)

## 2014-08-01 LAB — GLUCOSE, CAPILLARY
GLUCOSE-CAPILLARY: 133 mg/dL — AB (ref 70–99)
GLUCOSE-CAPILLARY: 120 mg/dL — AB (ref 70–99)
GLUCOSE-CAPILLARY: 147 mg/dL — AB (ref 70–99)

## 2014-08-01 NOTE — Progress Notes (Signed)
Pt very agitated,xanax which she takes tid at home had not been given since admission.Xanax.5 mg po given.Became extemely irritable with  NT Sarah Grindstaff & Jeanine NT when trying to get pt oob to the bsc.Stated " I want you both to lift me out of bed and place me on the bsc!".Pt very hesitant to try and move herself even the slightest.reviewed proper transfering techniques using walker for Anterior hip replacement.Some slight improvement noted later during the night and early morning.Independence encouraged for progression.Pt states she lives alone and will go to [facility] facility for Rehab after d/c.Jillian Bright D

## 2014-08-01 NOTE — Progress Notes (Signed)
Physical Therapy Treatment Patient Details Name: Jillian Bright MRN: [id number] DOB: Jan 24, 1946 Today's Date: 08/01/2014    History of Present Illness L THR    PT Comments    Marked improvement in activity tolerance from yesterday.  Pt progressing with mobility.  Follow Up Recommendations  SNF     Equipment Recommendations  None recommended by PT    Recommendations for Other Services OT consult     Precautions / Restrictions Precautions Precautions: Fall Precaution Comments: on home O2 Restrictions Weight Bearing Restrictions: No RLE Weight Bearing: Weight bearing as tolerated Other Position/Activity Restrictions: WBAT    Mobility  Bed Mobility Overal bed mobility: Needs Assistance Bed Mobility: Supine to Sit     Supine to sit: Mod assist     General bed mobility comments: cues for sequence and use of R LE to self assist - physical assist to manage L LE and to bring trunk to upright  Transfers Overall transfer level: Needs assistance Equipment used: Rolling walker (2 wheeled) Transfers: Sit to/from Stand Sit to Stand: Min assist;Mod assist Stand pivot transfers: Min assist;Mod assist       General transfer comment: cues for LE management and use of UEs to self assist  Ambulation/Gait Ambulation/Gait assistance: Min assist;Mod assist Ambulation Distance (Feet): 38 Feet Assistive device: Rolling walker (2 wheeled) Gait Pattern/deviations: Step-to pattern;Shuffle;Trunk flexed;Decreased step length - right;Decreased step length - left Gait velocity: decr   General Gait Details: cues for sequence, posture and position from [employer]            Wheelchair Mobility    Modified Rankin (Stroke Patients Only)       Balance                                    Cognition Arousal/Alertness: Awake/alert Behavior During Therapy: WFL for tasks assessed/performed Overall Cognitive Status: Within Functional Limits for tasks assessed                      Exercises      General Comments        Pertinent Vitals/Pain Pain Assessment: 0-10 Pain Score: 4  Pain Location: L hip/thigh Pain Descriptors / Indicators: Aching;Sore Pain Intervention(s): Limited activity within patient's tolerance;Monitored during session;Premedicated before session;Ice applied    Home Living                      Prior Function            PT Goals (current goals can now be found in the care plan section) Acute Rehab PT Goals Patient Stated Goal: Rehab at [facility] and then home PT Goal Formulation: With patient Time For Goal Achievement: 08/07/14 Potential to Achieve Goals: Good Progress towards PT goals: Progressing toward goals    Frequency  7X/week    PT Plan Current plan remains appropriate    Co-evaluation             End of Session Equipment Utilized During Treatment: Gait belt Activity Tolerance: Patient tolerated treatment well Patient left: in chair;with call bell/phone within reach     Time: 1052-1122 PT Time Calculation (min) (ACUTE ONLY): 30 min  Charges:  $Gait Training: 23-37 mins $Therapeutic Activity: 8-22 mins                    G Codes:      Jillian Bright  08/01/2014, 12:33 PM

## 2014-08-01 NOTE — Progress Notes (Addendum)
Checked on Pt at approx 2050,stated she wanted to stay on [facility] longer to try and have a bm.Went to get pain medicine and heard another high fall risk pt trying to get oob in another room.went to assist that pt and was unable to get back with her pain medicine until 2105.Pt stated she got restless,tired of waiting for NT Annette to come back to her room and got herself back to bed by herself.Pt reminded to please call for assistance next time due to high risk of falling and injury.Pt is irritable and argumentative at this time.Charge Nurse Estill Bamberg in to see pt. Aggie Moats D

## 2014-08-01 NOTE — Progress Notes (Signed)
Physical Therapy Treatment Patient Details Name: Jillian Bright MRN: [id number] DOB: 08/26/1945 Today's Date: August 28, 2014    History of Present Illness L THR    PT Comments      Follow Up Recommendations  SNF     Equipment Recommendations  None recommended by PT    Recommendations for Other Services OT consult     Precautions / Restrictions Precautions Precautions: Fall Precaution Comments: on home O2 Restrictions Weight Bearing Restrictions: No RLE Weight Bearing: Weight bearing as tolerated Other Position/Activity Restrictions: WBAT    Mobility  Bed Mobility Overal bed mobility: Needs Assistance Bed Mobility: Supine to Sit     Supine to sit: Mod assist     General bed mobility comments: cues for sequence and use of R LE to self assist - physical assist to manage L LE and to bring trunk to upright  Transfers Overall transfer level: Needs assistance Equipment used: Rolling walker (2 wheeled) Transfers: Sit to/from Stand Sit to Stand: Min assist;Mod assist Stand pivot transfers: Min assist;Mod assist       General transfer comment: cues for LE management and use of UEs to self assist  Ambulation/Gait Ambulation/Gait assistance: Min assist;Mod assist Ambulation Distance (Feet): 5 Feet Assistive device: Rolling walker (2 wheeled) Gait Pattern/deviations: Step-to pattern;Decreased step length - right;Decreased step length - left;Shuffle;Trunk flexed Gait velocity: decr   General Gait Details: cues for sequence, posture and position from [employer]            Wheelchair Mobility    Modified Rankin (Stroke Patients Only)       Balance                                    Cognition Arousal/Alertness: Awake/alert Behavior During Therapy: WFL for tasks assessed/performed Overall Cognitive Status: Within Functional Limits for tasks assessed                      Exercises      General Comments        Pertinent  Vitals/Pain Pain Assessment: 0-10 Pain Score: 4  Pain Location: L hip/thigh Pain Descriptors / Indicators: Aching;Sore Pain Intervention(s): Limited activity within patient's tolerance;Monitored during session;Premedicated before session;Ice applied    Home Living                      Prior Function            PT Goals (current goals can now be found in the care plan section) Acute Rehab PT Goals Patient Stated Goal: Rehab at [facility] and then home PT Goal Formulation: With patient Time For Goal Achievement: 08/07/14 Potential to Achieve Goals: Good Progress towards PT goals: Progressing toward goals    Frequency  7X/week    PT Plan Current plan remains appropriate    Co-evaluation             End of Session Equipment Utilized During Treatment: Gait belt Activity Tolerance: Patient tolerated treatment well Patient left: Other (comment) (bed side commode)     Time: 6834-1962 PT Time Calculation (min) (ACUTE ONLY): 12 min  Charges:  $Therapeutic Activity: 8-22 mins                    G Codes:      Jillian Bright Aug 28, 2014, 12:22 PM

## 2014-08-01 NOTE — Progress Notes (Signed)
Subjective: 2 Days Post-Op Procedure(s) (LRB): LEFT TOTAL HIP ARTHROPLASTY ANTERIOR APPROACH (Left) Patient reports pain as moderate.    Objective: Vital signs in last 24 hours: Temp:  [98.4 F (36.9 C)-99.7 F (37.6 C)] 98.8 F (37.1 C) (03/06 0615) Pulse Rate:  [88-99] 99 (03/06 0615) Resp:  [12-20] 18 (03/06 0615) BP: (82-109)/(49-60) 94/50 mmHg (03/06 0615) SpO2:  [4 %-100 %] 100 % (03/06 0853)  Intake/Output from previous day: 03/05 0701 - 03/06 0700 In: 2240 [P.O.:1680; I.V.:510; IV Piggyback:50] Out: 850 [Urine:850] Intake/Output this shift:     Recent Labs  07/31/14 0442 08/01/14 0511  HGB 8.2* 8.1*    Recent Labs  07/31/14 0442 08/01/14 0511  WBC 5.7 6.2  RBC 3.21* 3.27*  HCT 27.5* 27.8*  PLT 150 147*    Recent Labs  07/31/14 0442  NA 140  K 3.8  CL 102  CO2 33*  BUN 15  CREATININE 0.80  GLUCOSE 112*  CALCIUM 8.2*   No results for input(s): LABPT, INR in the last 72 hours.  Neurologically intact  Assessment/Plan: 2 Days Post-Op Procedure(s) (LRB): LEFT TOTAL HIP ARTHROPLASTY ANTERIOR APPROACH (Left) Up with therapy  Will be SNF. Assist to get up. Hgb stable. Will remove SL  Rhesa Forsberg C 08/01/2014, 10:29 AM

## 2014-08-02 ENCOUNTER — Encounter (HOSPITAL_COMMUNITY): Payer: Self-pay | Admitting: Orthopedic Surgery

## 2014-08-02 LAB — CBC
HEMATOCRIT: 25.5 % — AB (ref 36.0–46.0)
Hemoglobin: 7.5 g/dL — ABNORMAL LOW (ref 12.0–15.0)
MCH: 25.3 pg — ABNORMAL LOW (ref 26.0–34.0)
MCHC: 29.4 g/dL — ABNORMAL LOW (ref 30.0–36.0)
MCV: 85.9 fL (ref 78.0–100.0)
Platelets: 145 10*3/uL — ABNORMAL LOW (ref 150–400)
RBC: 2.97 MIL/uL — AB (ref 3.87–5.11)
RDW: 16.5 % — ABNORMAL HIGH (ref 11.5–15.5)
WBC: 6.9 10*3/uL (ref 4.0–10.5)

## 2014-08-02 LAB — GLUCOSE, CAPILLARY
GLUCOSE-CAPILLARY: 108 mg/dL — AB (ref 70–99)
Glucose-Capillary: 104 mg/dL — ABNORMAL HIGH (ref 70–99)
Glucose-Capillary: 151 mg/dL — ABNORMAL HIGH (ref 70–99)

## 2014-08-02 LAB — PREPARE RBC (CROSSMATCH)

## 2014-08-02 MED ORDER — METHOCARBAMOL 500 MG PO TABS: 500.0000 mg | ORAL_TABLET | Freq: Three times a day (TID) | ORAL | Status: DC | PRN

## 2014-08-02 MED ORDER — SODIUM CHLORIDE 0.9 % IV SOLN
Freq: Once | INTRAVENOUS | Status: AC
  Administered 2014-08-02: 09:00:00 via INTRAVENOUS

## 2014-08-02 MED ORDER — OXYCODONE HCL 5 MG PO TABS: 5.0000 mg | ORAL_TABLET | ORAL | Status: DC | PRN

## 2014-08-02 MED ORDER — RIVAROXABAN 10 MG PO TABS: 10.0000 mg | ORAL_TABLET | Freq: Every day | ORAL | Status: DC

## 2014-08-02 NOTE — Progress Notes (Signed)
Clinical Social Work Department BRIEF PSYCHOSOCIAL ASSESSMENT 08/02/2014  Patient:  Jillian Bright, Jillian Bright     Account Number:  [id number]     [place] date:  07/30/2014  Clinical Social Worker:  Lacie Scotts  Date/Time:  08/02/2014 03:21 PM  Referred by:  CSW  Date Referred:  08/02/2014 Referred for  SNF Placement   Other Referral:   Interview type:  Patient Other interview type:    PSYCHOSOCIAL DATA Living Status:  ALONE Admitted from facility:   Level of care:   Primary support name:  Shanon Brow Primary support relationship to patient:  SIBLING Degree of support available:   supportive    CURRENT CONCERNS  Other Concerns:    SOCIAL WORK ASSESSMENT / PLAN Pt is a 69 yr old female living at home prior to hospitalization. CSW met with pt to assist with d/c planning. This is a planned admission.  Pt had made prior arrangements to have ST Rehab at [facility] following hospital d/c. CSW had confirmed d/c plans on Friday prior to pt being admitted to 6 E. Unfortunately, weekend csw was unable to meet with pt and pass on this info. Info was provided this am. Pt was ready for d/c today to [facility]. [facility] transport was required. Pt was in agreement with this plan.   Assessment/plan status:  Psychosocial Support/Ongoing Assessment of Needs Other assessment/ plan:   Information/referral to community resources:   Insurance coverage for SNF and ambulance transport reviewed.    PATIENT'S/FAMILY'S RESPONSE TO PLAN OF CARE: " I had a good weekend. I was wondering about my rehab plans. I'm glad you're here to talk to me about rehab. " Pt's mood was bright. She was pleased that [facility] had an opening for her. Pt is looking forward to starting rehab.    Werner Lean LCSW [phone]

## 2014-08-02 NOTE — Progress Notes (Signed)
Subjective: Pt stable - moving better today   Objective: Vital signs in last 24 hours: Temp:  [98.2 F (36.8 C)-98.9 F (37.2 C)] 98.5 F (36.9 C) (03/07 0640) Pulse Rate:  [88-97] 88 (03/07 0640) Resp:  [18-19] 18 (03/07 0640) BP: (93-124)/(55-70) 124/67 mmHg (03/07 0640) SpO2:  [96 %-100 %] 96 % (03/07 0640)  Intake/Output from previous day: 03/06 0701 - 03/07 0700 In: 1560 [P.O.:1560] Out: -  Intake/Output this shift: Total I/O In: 600 [P.O.:600] Out: -   Exam:  Neurovascular intact Sensation intact distally Intact pulses distally  Labs:  Recent Labs  07/31/14 0442 08/01/14 0511 08/02/14 0444  HGB 8.2* 8.1* 7.5*    Recent Labs  08/01/14 0511 08/02/14 0444  WBC 6.2 6.9  RBC 3.27* 2.97*  HCT 27.8* 25.5*  PLT 147* 145*    Recent Labs  07/31/14 0442  NA 140  K 3.8  CL 102  CO2 33*  BUN 15  CREATININE 0.80  GLUCOSE 112*  CALCIUM 8.2*   No results for input(s): LABPT, INR in the last 72 hours.  Assessment/Plan: hgb a little low - not particularly symptomatic but bp trending lower - will tx 1 u prbc today - plan for dc to snf today or tomorrow - fl2 signed   Jillian Bright 08/02/2014, 6:57 AM

## 2014-08-02 NOTE — Progress Notes (Signed)
Clinical Social Work Department CLINICAL SOCIAL WORK PLACEMENT NOTE 08/02/2014  Patient:  Jillian Bright, Jillian Bright  Account Number:  [id number] Admit date:  07/30/2014  Clinical Social Worker:  Werner Lean, LCSW  Date/time:  08/02/2014 03:35 PM  Clinical Social Work is seeking post-discharge placement for this patient at the following level of care:   SKILLED NURSING   (*CSW will update this form in Epic as items are completed)     Patient/family provided with [place] Department of Clinical Social Work's list of facilities offering this level of care within the geographic area requested by the patient (or if unable, by the patient's family).  08/02/2014  Patient/family informed of their freedom to choose among providers that offer the needed level of care, that participate in Medicare, Medicaid or managed care program needed by the patient, have an available bed and are willing to accept the patient.    Patient/family informed of [facility]' ownership interest in [facility], as well as of the fact that they are under no obligation to receive care at this facility.  PASARR submitted to EDS on 07/30/2014 PASARR number received on 07/30/2014  FL2 transmitted to all facilities in geographic area requested by pt/family on  07/30/2014 FL2 transmitted to all facilities within larger geographic area on   Patient informed that his/her managed care company has contracts with or will negotiate with  certain facilities, including the following:     Patient/family informed of bed offers received:  08/02/2014 Patient chooses bed at [place] Physician recommends and patient chooses bed at    Patient to be transferred to [place] on  08/02/2014 Patient to be transferred to facility by [facility] Patient and family notified of transfer on 08/02/2014 Name of family member notified:  Pt called family directly.  The following physician request were  entered in Epic:   Additional Comments: NSG reviewed d/c summary, scripts,avs. Scripts included in d/c packet.  Werner Lean LCSW [phone]

## 2014-08-02 NOTE — Discharge Summary (Signed)
Physician Discharge Summary  Patient ID: Jillian Bright MRN: [id number] DOB/AGE: [birth year] 71 y.o.  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 female who was admitted 07/30/2014 with a chief complaint of left hip pain, and found to have a diagnosis of left hip arthritis.  They were brought to the operating room on 07/30/2014 and underwent the above named procedures. She was slow to mobilize with PT but ambulated well by POD 2. Patient transfused 1 u prbc for hgb 7.5 on POD 3   Antibiotics given:  Anti-infectives    Start     Dose/Rate Route Frequency Ordered Stop   07/31/14 0600  ceFAZolin (ANCEF) IVPB 2 g/50 mL premix     2 g 100 mL/hr over 30 Minutes Intravenous On call to O.R. 07/30/14 1245 07/30/14 1500   07/30/14 2300  ceFAZolin (ANCEF) IVPB 2 g/50 mL premix     2 g 100 mL/hr over 30 Minutes Intravenous 3 times per day 07/30/14 1822 07/31/14 0806    .  Recent vital signs:  Filed Vitals:   08/02/14 0640  BP: 124/67  Pulse: 88  Temp: 98.5 F (36.9 C)  Resp: 18    Recent laboratory studies:  Results for orders placed or performed during the hospital encounter of 07/30/14  Glucose, capillary  Result Value Ref Range   Glucose-Capillary 120 (H) 70 - 99 mg/dL   Comment 1 Notify RN   Glucose, capillary  Result Value Ref Range   Glucose-Capillary 66 (L) 70 - 99 mg/dL   Comment 1 Notify RN    Comment 2 Document in Chart   Glucose, capillary  Result Value Ref Range   Glucose-Capillary 111 (H) 70 - 99 mg/dL  CBC  Result Value Ref Range   WBC 5.7 4.0 - 10.5 K/uL   RBC 3.21 (L) 3.87 - 5.11 MIL/uL   Hemoglobin 8.2 (L) 12.0 - 15.0 g/dL   HCT 27.5 (L) 36.0 -  46.0 %   MCV 85.7 78.0 - 100.0 fL   MCH 25.5 (L) 26.0 - 34.0 pg   MCHC 29.8 (L) 30.0 - 36.0 g/dL   RDW 16.4 (H) 11.5 - 15.5 %   Platelets 150 150 - 400 K/uL  Basic metabolic panel  Result Value Ref Range   Sodium 140 135 - 145 mmol/L   Potassium 3.8 3.5 - 5.1 mmol/L   Chloride 102 96 - 112 mmol/L   CO2 33 (H) 19 - 32 mmol/L   Glucose, Bld 112 (H) 70 - 99 mg/dL   BUN 15 6 - 23 mg/dL   Creatinine, Ser 0.80 0.50 - 1.10 mg/dL   Calcium 8.2 (L) 8.4 - 10.5 mg/dL   GFR calc non Af Amer 73 (L) >90 mL/min   GFR calc Af Amer 85 (L) >90 mL/min   Anion gap 5 5 - 15  Glucose, capillary  Result Value Ref Range   Glucose-Capillary 127 (H) 70 - 99 mg/dL  Glucose, capillary  Result Value Ref Range   Glucose-Capillary 123 (H) 70 - 99 mg/dL  CBC  Result Value Ref Range   WBC 6.2 4.0 - 10.5 K/uL   RBC 3.27 (L) 3.87 - 5.11 MIL/uL   Hemoglobin 8.1 (  L) 12.0 - 15.0 g/dL   HCT 27.8 (L) 36.0 - 46.0 %   MCV 85.0 78.0 - 100.0 fL   MCH 24.8 (L) 26.0 - 34.0 pg   MCHC 29.1 (L) 30.0 - 36.0 g/dL   RDW 16.6 (H) 11.5 - 15.5 %   Platelets 147 (L) 150 - 400 K/uL  Glucose, capillary  Result Value Ref Range   Glucose-Capillary 150 (H) 70 - 99 mg/dL   Comment 1 Notify RN   Glucose, capillary  Result Value Ref Range   Glucose-Capillary 147 (H) 70 - 99 mg/dL  Glucose, capillary  Result Value Ref Range   Glucose-Capillary 133 (H) 70 - 99 mg/dL   Comment 1 Notify RN    Comment 2 Document in Chart   Glucose, capillary  Result Value Ref Range   Glucose-Capillary 120 (H) 70 - 99 mg/dL   Comment 1 Notify RN    Comment 2 Document in Chart   CBC  Result Value Ref Range   WBC 6.9 4.0 - 10.5 K/uL   RBC 2.97 (L) 3.87 - 5.11 MIL/uL   Hemoglobin 7.5 (L) 12.0 - 15.0 g/dL   HCT 25.5 (L) 36.0 - 46.0 %   MCV 85.9 78.0 - 100.0 fL   MCH 25.3 (L) 26.0 - 34.0 pg   MCHC 29.4 (L) 30.0 - 36.0 g/dL   RDW 16.5 (H) 11.5 - 15.5 %   Platelets 145 (L) 150 - 400 K/uL  Glucose, capillary  Result Value Ref Range    Glucose-Capillary 147 (H) 70 - 99 mg/dL    Discharge Medications:     Medication List    STOP taking these medications        aspirin EC 81 MG tablet     celecoxib 200 MG capsule  Commonly known as:  CELEBREX     HYDROcodone-acetaminophen 10-325 MG per tablet  Commonly known as:  NORCO     oxyCODONE-acetaminophen 10-325 MG per tablet  Commonly known as:  PERCOCET      TAKE these medications        albuterol 108 (90 BASE) MCG/ACT inhaler  Commonly known as:  PROVENTIL HFA;VENTOLIN HFA  Inhale 2 puffs into the lungs every 6 (six) hours as needed for wheezing or shortness of breath.     ALPRAZolam 0.5 MG tablet  Commonly known as:  XANAX  Take 1 tablet (0.5 mg total) by mouth 3 (three) times daily as needed for anxiety.     atorvastatin 20 MG tablet  Commonly known as:  LIPITOR  Take 1 tablet (20 mg total) by mouth daily.     budesonide-formoterol 160-4.5 MCG/ACT inhaler  Commonly known as:  SYMBICORT  Inhale 2 puffs into the lungs 2 (two) times daily.     DULoxetine 60 MG capsule  Commonly known as:  CYMBALTA  Take 1 capsule (60 mg total) by mouth daily.     furosemide 40 MG tablet  Commonly known as:  LASIX  Take 40 mg by mouth every morning.     gabapentin 300 MG capsule  Commonly known as:  NEURONTIN  Take 1 capsule (300 mg total) by mouth 3 (three) times daily.     glipiZIDE 10 MG tablet  Commonly known as:  GLUCOTROL  TAKE 1 TABLET (10 MG TOTAL) BY MOUTH 2 (TWO) TIMES DAILY BEFORE A MEAL.     glucose blood test strip  Commonly known as:  ACCU-CHEK AVIVA PLUS  Test once per day and diagnosis code is 250.00  metFORMIN 1000 MG tablet  Commonly known as:  GLUCOPHAGE  Take 1,000 mg by mouth 2 (two) times daily with a meal.     methocarbamol 500 MG tablet  Commonly known as:  ROBAXIN  Take 1 tablet (500 mg total) by mouth every 8 (eight) hours as needed for muscle spasms.     omeprazole 40 MG capsule  Commonly known as:  PRILOSEC  Take 1 capsule  (40 mg total) by mouth daily before breakfast.     OVER THE COUNTER MEDICATION  Place 1-2 drops into both eyes daily as needed (dry eyes.). Soothe.     oxyCODONE 5 MG immediate release tablet  Commonly known as:  Oxy IR/ROXICODONE  Take 1-2 tablets (5-10 mg total) by mouth every 3 (three) hours as needed for breakthrough pain.     rivaroxaban 10 MG Tabs tablet  Commonly known as:  XARELTO  Take 1 tablet (10 mg total) by mouth daily with breakfast.     sitaGLIPtin 100 MG tablet  Commonly known as:  JANUVIA  Take 1 tablet (100 mg total) by mouth daily.     solifenacin 10 MG tablet  Commonly known as:  VESICARE  Take 1 tablet (10 mg total) by mouth daily.     spironolactone 25 MG tablet  Commonly known as:  ALDACTONE  Take 25 mg by mouth every morning.        Diagnostic Studies: Dg Chest 2 View  07/21/2014   CLINICAL DATA:  Preoperative chest radiograph for LEFT hip replacement, history COPD, diabetes, sleep apnea, pulmonary hypertension, coronary artery disease post MI, CHF  EXAM: CHEST  2 VIEW  COMPARISON:  01/28/2014  FINDINGS: Upper normal heart size.  Prominent epicardial fat pad at RIGHT cardiophrenic angle unchanged.  Mediastinal contours and pulmonary vascularity otherwise normal.  Emphysematous and bronchitic changes consistent with COPD.  Subsegmental atelectasis versus scarring at lingula.  Lungs clear.  No pleural effusion or pneumothorax.  Bones demineralized diffusely.  IMPRESSION: COPD changes with atelectasis versus scarring in lingula.  No acute abnormalities.   Electronically Signed   By: Lavonia Dana M.D.   On: 07/21/2014 16:44   Mr Lumbar Spine W Wo Contrast  07/05/2014   CLINICAL DATA:  Low back pain. LEFT hip and leg pain. History of lumbar surgery. History of rheumatoid arthritis  EXAM: MRI LUMBAR SPINE WITHOUT AND WITH CONTRAST  TECHNIQUE: Multiplanar and multiecho pulse sequences of the lumbar spine were obtained without and with intravenous contrast.  CONTRAST:   23mL MULTIHANCE GADOBENATE DIMEGLUMINE 529 MG/ML IV SOLN  COMPARISON:  None.  FINDINGS: Segmentation: Normal.  Alignment:  Normal.  Vertebrae: No worrisome osseous lesion.  Conus medullaris: Normal in size, signal, and location.  Paraspinal tissues: No evidence for hydronephrosis or paravertebral mass.  Disc levels:  L1-L2: Small central and rightward protrusion. No impingement. Asymmetric facet arthropathy on the RIGHT.  L2-L3: Central and rightward protrusion. BILATERAL facet arthropathy. RIGHT subarticular zone narrowing. RIGHT-sided foraminal zone narrowing. The RIGHT L3 nerve root may be at risk.  L3-L4: Mild bulge. Moderate BILATERAL posterior element hypertrophy. Mild central canal stenosis without definite subarticular zone or foraminal zone narrowing.  L4-L5: Slight disc space narrowing. RIGHT hemilaminotomy. Mild RIGHT and moderate LEFT facet arthropathy. No impingement.  L5-S1: Severe LEFT facet arthropathy is asymmetric, with synovial proliferation dorsally. No subarticular zone or foraminal zone narrowing.  No significant postcontrast enhancement. Incidental Tarlov cyst LEFT S1 foramen.  IMPRESSION: Postsurgical changes at L4-5 on the RIGHT without recurrent protrusion.  Asymmetric  disc pathology at L2-3 and L1-2 on the RIGHT.  No significant LEFT-sided compressive lesion.  Asymmetric facet arthropathy at L5-S1 and L4-5 on the LEFT is noted. Correlate clinically for facet mediated lumbago/ sciatica.   Electronically Signed   By: Rolla Flatten M.D.   On: 07/05/2014 14:27   Dg C-arm Gt 120 Min-no Report  07/30/2014   CLINICAL DATA: surgery   C-ARM GT 120 MINUTE  Fluoroscopy was utilized by the requesting physician.  No radiographic  interpretation.    Dg Hip Unilat With Pelvis 1v Left  07/30/2014   CLINICAL DATA:  Left total hip replacement  EXAM: LEFT HIP (WITH PELVIS) 1 VIEW  COMPARISON:  None.  FINDINGS: Femoral and acetabular components of left hip replacement appear in anticipated position.   IMPRESSION: Fluoroscopic spot views series shows left total hip replacement   Electronically Signed   By: Skipper Cliche M.D.   On: 07/30/2014 17:54   Dg Hip Port Unilat With Pelvis 1v Left  07/30/2014   CLINICAL DATA:  Postop from left hip arthroplasty. Rheumatoid arthritis.  EXAM: LEFT HIP (WITH PELVIS) 2 VIEW PORTABLE  COMPARISON:  None.  FINDINGS: Bipolar left hip prosthesis is seen in expected position. No evidence of fracture or dislocation.  IMPRESSION: Expected postoperative appearance of left hip prosthesis. No evidence of fracture or other complication.   Electronically Signed   By: Earle Gell M.D.   On: 07/30/2014 18:55    Disposition: 03-Skilled Nursing Facility      Discharge Instructions    Call MD / Call 911    Complete by:  As directed   If you experience chest pain or shortness of breath, CALL 911 and be transported to the hospital emergency room.  If you develope a fever above 101 F, pus (white drainage) or increased drainage or redness at the wound, or calf pain, call your surgeon's office.     Constipation Prevention    Complete by:  As directed   Drink plenty of fluids.  Prune juice may be helpful.  You may use a stool softener, such as Colace (over the counter) 100 mg twice a day.  Use MiraLax (over the counter) for constipation as needed.     Diet - low sodium heart healthy    Complete by:  As directed      Discharge instructions    Complete by:  As directed   Weight bearing as tolerated - keep incision dry     Increase activity slowly as tolerated    Complete by:  As directed               Signed: DEAN,GREGORY SCOTT 08/02/2014, 7:06 AM

## 2014-08-02 NOTE — Care Management Note (Signed)
    Page 1 of 1   08/02/2014     12:15:41 PM CARE MANAGEMENT NOTE 08/02/2014  Patient:  Jillian Bright, Jillian Bright   Account Number:  [id number]  Date Initiated:  07/31/2014  Documentation initiated by:  Apolonio Schneiders  Subjective/Objective Assessment:   LEFT TOTAL HIP ARTHROPLASTY ANTERIOR APPROACH (Left)     Action/Plan:   SNF for rehab   Anticipated DC Date:  08/02/2014   Anticipated DC Plan:  SKILLED NURSING FACILITY  In-house referral  Clinical Social Worker      DC Planning Services  CM consult      Choice offered to / List presented to:             Status of service:  Completed, signed off Medicare Important Message given?  YES (If response is "NO", the following Medicare IM given date fields will be blank) Date Medicare IM given:  08/02/2014 Medicare IM given by:  [facility] Date Additional Medicare IM given:   Additional Medicare IM given by:    Discharge Disposition:  [place]  Per UR Regulation:  Reviewed for med. necessity/level of care/duration of stay  If discussed at [place] of Stay Meetings, dates discussed:    Comments:  07/31/14 1035 - CM spoke with patient at the bedside. Plans to go to [facility] for rehab at discharge. Lives at home alone. Has a shower chair and rolling walker at home. Venita Sheffield RN BSN CCM [phone]

## 2014-08-03 LAB — TYPE AND SCREEN
ABO/RH(D): O NEG
Antibody Screen: NEGATIVE
Unit division: 0

## 2014-09-09 DIAGNOSIS — Z0279 Encounter for issue of other medical certificate: Secondary | ICD-10-CM

## 2014-09-15 ENCOUNTER — Emergency Department (HOSPITAL_COMMUNITY)
Admission: EM | Admit: 2014-09-15 | Discharge: 2014-09-16 | Disposition: A | Discharge status: Home / Self Care | Payer: Medicare Other | Attending: Emergency Medicine | Admitting: Emergency Medicine

## 2014-09-15 ENCOUNTER — Emergency Department (HOSPITAL_COMMUNITY): Payer: Medicare Other

## 2014-09-15 ENCOUNTER — Encounter (HOSPITAL_COMMUNITY): Payer: Self-pay | Admitting: Emergency Medicine

## 2014-09-15 DIAGNOSIS — Z79899 Other long term (current) drug therapy: Secondary | ICD-10-CM | POA: Diagnosis not present

## 2014-09-15 DIAGNOSIS — F419 Anxiety disorder, unspecified: Secondary | ICD-10-CM | POA: Diagnosis not present

## 2014-09-15 DIAGNOSIS — Y92410 Unspecified street and highway as the place of occurrence of the external cause: Secondary | ICD-10-CM | POA: Insufficient documentation

## 2014-09-15 DIAGNOSIS — Z8701 Personal history of pneumonia (recurrent): Secondary | ICD-10-CM | POA: Diagnosis not present

## 2014-09-15 DIAGNOSIS — K219 Gastro-esophageal reflux disease without esophagitis: Secondary | ICD-10-CM | POA: Insufficient documentation

## 2014-09-15 DIAGNOSIS — J449 Chronic obstructive pulmonary disease, unspecified: Secondary | ICD-10-CM | POA: Diagnosis not present

## 2014-09-15 DIAGNOSIS — E119 Type 2 diabetes mellitus without complications: Secondary | ICD-10-CM | POA: Insufficient documentation

## 2014-09-15 DIAGNOSIS — Y9389 Activity, other specified: Secondary | ICD-10-CM | POA: Diagnosis not present

## 2014-09-15 DIAGNOSIS — S199XXA Unspecified injury of neck, initial encounter: Secondary | ICD-10-CM | POA: Diagnosis present

## 2014-09-15 DIAGNOSIS — I10 Essential (primary) hypertension: Secondary | ICD-10-CM | POA: Insufficient documentation

## 2014-09-15 DIAGNOSIS — I2510 Atherosclerotic heart disease of native coronary artery without angina pectoris: Secondary | ICD-10-CM | POA: Diagnosis not present

## 2014-09-15 DIAGNOSIS — I509 Heart failure, unspecified: Secondary | ICD-10-CM | POA: Insufficient documentation

## 2014-09-15 DIAGNOSIS — I252 Old myocardial infarction: Secondary | ICD-10-CM | POA: Insufficient documentation

## 2014-09-15 DIAGNOSIS — Z8739 Personal history of other diseases of the musculoskeletal system and connective tissue: Secondary | ICD-10-CM | POA: Diagnosis not present

## 2014-09-15 DIAGNOSIS — Z7951 Long term (current) use of inhaled steroids: Secondary | ICD-10-CM | POA: Insufficient documentation

## 2014-09-15 DIAGNOSIS — Y998 Other external cause status: Secondary | ICD-10-CM | POA: Insufficient documentation

## 2014-09-15 DIAGNOSIS — S161XXA Strain of muscle, fascia and tendon at neck level, initial encounter: Secondary | ICD-10-CM | POA: Insufficient documentation

## 2014-09-15 DIAGNOSIS — T1490XA Injury, unspecified, initial encounter: Secondary | ICD-10-CM

## 2014-09-15 DIAGNOSIS — E785 Hyperlipidemia, unspecified: Secondary | ICD-10-CM | POA: Diagnosis not present

## 2014-09-15 DIAGNOSIS — Z87891 Personal history of nicotine dependence: Secondary | ICD-10-CM | POA: Diagnosis not present

## 2014-09-15 DIAGNOSIS — E669 Obesity, unspecified: Secondary | ICD-10-CM | POA: Diagnosis not present

## 2014-09-15 MED ORDER — ACETAMINOPHEN 500 MG PO TABS
1000.0000 mg | ORAL_TABLET | Freq: Once | ORAL | Status: AC
  Administered 2014-09-16: 1000 mg via ORAL
  Filled 2014-09-15: qty 2
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
## 2014-09-15 NOTE — ED Notes (Signed)
Pt transported to CT scan area, pt still un bedpan, CT and CN notified.

## 2014-09-16 MED ORDER — IBUPROFEN 800 MG PO TABS: 800.0000 mg | ORAL_TABLET | Freq: Three times a day (TID) | ORAL | Status: DC

## 2014-09-16 MED ORDER — METHOCARBAMOL 500 MG PO TABS: 500.0000 mg | ORAL_TABLET | Freq: Two times a day (BID) | ORAL | Status: DC | PRN

## 2014-09-16 NOTE — Discharge Instructions (Signed)

## 2014-09-29 ENCOUNTER — Other Ambulatory Visit: Payer: Self-pay | Admitting: Family Medicine

## 2014-09-30 NOTE — Telephone Encounter (Signed)
Call in #[phone] with 5 rf 

## 2014-10-01 ENCOUNTER — Encounter: Payer: Self-pay | Admitting: Family Medicine

## 2014-11-01 ENCOUNTER — Telehealth: Payer: Self-pay

## 2014-11-01 NOTE — Telephone Encounter (Signed)
Pt does not want to get a mammogram b/c it hurts too bad.  If shechanges her n=mind she will make own appt

## 2014-11-01 NOTE — Telephone Encounter (Signed)
Left message for pt to call us back (concerning her overdue mammogram)

## 2014-11-17 ENCOUNTER — Inpatient Hospital Stay (HOSPITAL_COMMUNITY)
Admission: EM | Admit: 2014-11-17 | Discharge: 2014-11-19 | DRG: 189 | Disposition: A | Discharge status: Home Health | Payer: Medicare Other | Attending: Internal Medicine | Admitting: Internal Medicine

## 2014-11-17 ENCOUNTER — Emergency Department (HOSPITAL_COMMUNITY): Payer: Medicare Other

## 2014-11-17 ENCOUNTER — Encounter (HOSPITAL_COMMUNITY): Payer: Self-pay | Admitting: Vascular Surgery

## 2014-11-17 DIAGNOSIS — R55 Syncope and collapse: Secondary | ICD-10-CM | POA: Diagnosis present

## 2014-11-17 DIAGNOSIS — I5032 Chronic diastolic (congestive) heart failure: Secondary | ICD-10-CM | POA: Diagnosis present

## 2014-11-17 DIAGNOSIS — J449 Chronic obstructive pulmonary disease, unspecified: Secondary | ICD-10-CM | POA: Diagnosis present

## 2014-11-17 DIAGNOSIS — Z9842 Cataract extraction status, left eye: Secondary | ICD-10-CM | POA: Diagnosis not present

## 2014-11-17 DIAGNOSIS — N390 Urinary tract infection, site not specified: Secondary | ICD-10-CM | POA: Diagnosis present

## 2014-11-17 DIAGNOSIS — R4182 Altered mental status, unspecified: Secondary | ICD-10-CM

## 2014-11-17 DIAGNOSIS — E669 Obesity, unspecified: Secondary | ICD-10-CM | POA: Diagnosis present

## 2014-11-17 DIAGNOSIS — G4733 Obstructive sleep apnea (adult) (pediatric): Secondary | ICD-10-CM | POA: Diagnosis present

## 2014-11-17 DIAGNOSIS — G9340 Encephalopathy, unspecified: Secondary | ICD-10-CM | POA: Diagnosis present

## 2014-11-17 DIAGNOSIS — Z6831 Body mass index (BMI) 31.0-31.9, adult: Secondary | ICD-10-CM

## 2014-11-17 DIAGNOSIS — J9622 Acute and chronic respiratory failure with hypercapnia: Secondary | ICD-10-CM | POA: Diagnosis present

## 2014-11-17 DIAGNOSIS — I252 Old myocardial infarction: Secondary | ICD-10-CM | POA: Diagnosis not present

## 2014-11-17 DIAGNOSIS — E119 Type 2 diabetes mellitus without complications: Secondary | ICD-10-CM

## 2014-11-17 DIAGNOSIS — R0602 Shortness of breath: Secondary | ICD-10-CM

## 2014-11-17 DIAGNOSIS — E785 Hyperlipidemia, unspecified: Secondary | ICD-10-CM | POA: Diagnosis present

## 2014-11-17 DIAGNOSIS — Z96642 Presence of left artificial hip joint: Secondary | ICD-10-CM | POA: Diagnosis present

## 2014-11-17 DIAGNOSIS — I10 Essential (primary) hypertension: Secondary | ICD-10-CM | POA: Diagnosis present

## 2014-11-17 DIAGNOSIS — J9621 Acute and chronic respiratory failure with hypoxia: Secondary | ICD-10-CM | POA: Diagnosis present

## 2014-11-17 DIAGNOSIS — Z9981 Dependence on supplemental oxygen: Secondary | ICD-10-CM

## 2014-11-17 DIAGNOSIS — L98419 Non-pressure chronic ulcer of buttock with unspecified severity: Secondary | ICD-10-CM | POA: Diagnosis present

## 2014-11-17 DIAGNOSIS — I272 Other secondary pulmonary hypertension: Secondary | ICD-10-CM | POA: Diagnosis present

## 2014-11-17 DIAGNOSIS — Z9841 Cataract extraction status, right eye: Secondary | ICD-10-CM

## 2014-11-17 DIAGNOSIS — Z79899 Other long term (current) drug therapy: Secondary | ICD-10-CM

## 2014-11-17 DIAGNOSIS — Z87891 Personal history of nicotine dependence: Secondary | ICD-10-CM

## 2014-11-17 DIAGNOSIS — D649 Anemia, unspecified: Secondary | ICD-10-CM | POA: Diagnosis present

## 2014-11-17 DIAGNOSIS — F411 Generalized anxiety disorder: Secondary | ICD-10-CM | POA: Diagnosis present

## 2014-11-17 DIAGNOSIS — L8990 Pressure ulcer of unspecified site, unspecified stage: Secondary | ICD-10-CM | POA: Diagnosis present

## 2014-11-17 DIAGNOSIS — K219 Gastro-esophageal reflux disease without esophagitis: Secondary | ICD-10-CM | POA: Diagnosis present

## 2014-11-17 DIAGNOSIS — J9620 Acute and chronic respiratory failure, unspecified whether with hypoxia or hypercapnia: Secondary | ICD-10-CM | POA: Diagnosis present

## 2014-11-17 DIAGNOSIS — M069 Rheumatoid arthritis, unspecified: Secondary | ICD-10-CM | POA: Diagnosis present

## 2014-11-17 DIAGNOSIS — E662 Morbid (severe) obesity with alveolar hypoventilation: Secondary | ICD-10-CM | POA: Diagnosis present

## 2014-11-17 DIAGNOSIS — I2510 Atherosclerotic heart disease of native coronary artery without angina pectoris: Secondary | ICD-10-CM | POA: Diagnosis present

## 2014-11-17 DIAGNOSIS — I25119 Atherosclerotic heart disease of native coronary artery with unspecified angina pectoris: Secondary | ICD-10-CM | POA: Diagnosis not present

## 2014-11-17 DIAGNOSIS — J438 Other emphysema: Secondary | ICD-10-CM | POA: Diagnosis not present

## 2014-11-17 DIAGNOSIS — J439 Emphysema, unspecified: Secondary | ICD-10-CM | POA: Diagnosis present

## 2014-11-17 LAB — CBC WITH DIFFERENTIAL/PLATELET
Basophils Absolute: 0 10*3/uL (ref 0.0–0.1)
Basophils Relative: 0 % (ref 0–1)
Eosinophils Absolute: 0.3 10*3/uL (ref 0.0–0.7)
Eosinophils Relative: 4 % (ref 0–5)
HEMATOCRIT: 33.7 % — AB (ref 36.0–46.0)
Hemoglobin: 9.4 g/dL — ABNORMAL LOW (ref 12.0–15.0)
LYMPHS ABS: 1.9 10*3/uL (ref 0.7–4.0)
LYMPHS PCT: 33 % (ref 12–46)
MCH: 22 pg — ABNORMAL LOW (ref 26.0–34.0)
MCHC: 27.9 g/dL — ABNORMAL LOW (ref 30.0–36.0)
MCV: 78.7 fL (ref 78.0–100.0)
Monocytes Absolute: 0.6 10*3/uL (ref 0.1–1.0)
Monocytes Relative: 11 % (ref 3–12)
NEUTROS PCT: 52 % (ref 43–77)
Neutro Abs: 3 10*3/uL (ref 1.7–7.7)
PLATELETS: 182 10*3/uL (ref 150–400)
RBC: 4.28 MIL/uL (ref 3.87–5.11)
RDW: 20 % — ABNORMAL HIGH (ref 11.5–15.5)
WBC: 5.7 10*3/uL (ref 4.0–10.5)

## 2014-11-17 LAB — I-STAT ARTERIAL BLOOD GAS, ED
Acid-Base Excess: 7 mmol/L — ABNORMAL HIGH (ref 0.0–2.0)
Bicarbonate: 33.6 mEq/L — ABNORMAL HIGH (ref 20.0–24.0)
O2 Saturation: 97 %
PCO2 ART: 57.7 mmHg — AB (ref 35.0–45.0)
PH ART: 7.373 (ref 7.350–7.450)
PO2 ART: 100 mmHg (ref 80.0–100.0)
Patient temperature: 98.6
TCO2: 35 mmol/L (ref 0–100)

## 2014-11-17 LAB — COMPREHENSIVE METABOLIC PANEL
ALT: 11 U/L — AB (ref 14–54)
AST: 17 U/L (ref 15–41)
Albumin: 3.5 g/dL (ref 3.5–5.0)
Alkaline Phosphatase: 72 U/L (ref 38–126)
Anion gap: 8 (ref 5–15)
BUN: 15 mg/dL (ref 6–20)
CALCIUM: 8.9 mg/dL (ref 8.9–10.3)
CO2: 29 mmol/L (ref 22–32)
Chloride: 99 mmol/L — ABNORMAL LOW (ref 101–111)
Creatinine, Ser: 1.05 mg/dL — ABNORMAL HIGH (ref 0.44–1.00)
GFR calc non Af Amer: 53 mL/min — ABNORMAL LOW (ref >60)
Glucose, Bld: 112 mg/dL — ABNORMAL HIGH (ref 65–99)
Potassium: 4.6 mmol/L (ref 3.5–5.1)
SODIUM: 136 mmol/L (ref 135–145)
TOTAL PROTEIN: 6.9 g/dL (ref 6.5–8.1)
Total Bilirubin: 0.7 mg/dL (ref 0.3–1.2)

## 2014-11-17 LAB — TROPONIN I: Troponin I: <0.03 ng/mL (ref <0.031)

## 2014-11-17 LAB — LIPASE, BLOOD: LIPASE: 19 U/L — AB (ref 22–51)

## 2014-11-17 LAB — CK: Total CK: 22 U/L — ABNORMAL LOW (ref 38–234)

## 2014-11-17 MED ORDER — SODIUM CHLORIDE 0.9 % IV BOLUS (SEPSIS)
500.0000 mL | Freq: Once | INTRAVENOUS | Status: AC
  Administered 2014-11-17: 500 mL via INTRAVENOUS

## 2014-11-17 MED ORDER — ONDANSETRON HCL 4 MG/2ML IJ SOLN: 4.0000 mg | Freq: Four times a day (QID) | INTRAMUSCULAR | Status: DC | PRN

## 2014-11-17 MED ORDER — ACETAMINOPHEN 650 MG RE SUPP: 650.0000 mg | Freq: Four times a day (QID) | RECTAL | Status: DC | PRN

## 2014-11-17 MED ORDER — SODIUM CHLORIDE 0.9 % IJ SOLN
3.0000 mL | Freq: Two times a day (BID) | INTRAMUSCULAR | Status: DC
  Administered 2014-11-18: 3 mL via INTRAVENOUS

## 2014-11-17 MED ORDER — RIVAROXABAN 10 MG PO TABS
10.0000 mg | ORAL_TABLET | Freq: Every day | ORAL | Status: DC
  Administered 2014-11-18: 10 mg via ORAL
  Filled 2014-11-17 (×2): qty 1

## 2014-11-17 MED ORDER — SODIUM CHLORIDE 0.9 % IV SOLN
INTRAVENOUS | Status: DC
  Administered 2014-11-17: 18:00:00 via INTRAVENOUS

## 2014-11-17 MED ORDER — SODIUM CHLORIDE 0.9 % IV SOLN
INTRAVENOUS | Status: AC
  Administered 2014-11-17: 21:00:00 via INTRAVENOUS

## 2014-11-17 MED ORDER — ACETAMINOPHEN 325 MG PO TABS: 650.0000 mg | ORAL_TABLET | Freq: Four times a day (QID) | ORAL | Status: DC | PRN

## 2014-11-17 MED ORDER — IPRATROPIUM-ALBUTEROL 0.5-2.5 (3) MG/3ML IN SOLN
3.0000 mL | Freq: Four times a day (QID) | RESPIRATORY_TRACT | Status: DC
  Administered 2014-11-18 (×2): 3 mL via RESPIRATORY_TRACT
  Filled 2014-11-17 (×2): qty 3

## 2014-11-17 MED ORDER — ONDANSETRON HCL 4 MG PO TABS: 4.0000 mg | ORAL_TABLET | Freq: Four times a day (QID) | ORAL | Status: DC | PRN

## 2014-11-17 MED ORDER — SODIUM CHLORIDE 0.9 % IJ SOLN: 3.0000 mL | INTRAMUSCULAR | Status: DC | PRN

## 2014-11-17 MED ORDER — CETYLPYRIDINIUM CHLORIDE 0.05 % MT LIQD: 7.0000 mL | Freq: Two times a day (BID) | OROMUCOSAL | Status: DC

## 2014-11-17 MED ORDER — PANTOPRAZOLE SODIUM 40 MG IV SOLR
40.0000 mg | INTRAVENOUS | Status: DC
  Administered 2014-11-17: 40 mg via INTRAVENOUS
  Filled 2014-11-17 (×2): qty 40

## 2014-11-17 MED ORDER — ONDANSETRON HCL 4 MG/2ML IJ SOLN
4.0000 mg | Freq: Once | INTRAMUSCULAR | Status: AC
  Administered 2014-11-17: 4 mg via INTRAVENOUS
  Filled 2014-11-17: qty 2

## 2014-11-17 MED ORDER — SODIUM CHLORIDE 0.9 % IV SOLN: 250.0000 mL | INTRAVENOUS | Status: DC | PRN

## 2014-11-17 MED ORDER — HYDROMORPHONE HCL 1 MG/ML IJ SOLN: 0.5000 mg | INTRAMUSCULAR | Status: DC | PRN

## 2014-11-17 MED ORDER — NALOXONE HCL 0.4 MG/ML IJ SOLN
0.4000 mg | Freq: Once | INTRAMUSCULAR | Status: AC
  Administered 2014-11-17: 0.4 mg via INTRAVENOUS
  Filled 2014-11-17: qty 1

## 2014-11-17 MED ORDER — CHLORHEXIDINE GLUCONATE 0.12 % MT SOLN
15.0000 mL | Freq: Two times a day (BID) | OROMUCOSAL | Status: DC
  Administered 2014-11-18: 15 mL via OROMUCOSAL
  Filled 2014-11-17 (×3): qty 15
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
## 2014-11-17 NOTE — ED Provider Notes (Signed)
CSN: [id number]     Arrival date & time 11/17/14  1722 History   First MD Initiated Contact with Patient 11/17/14 1737     Chief Complaint  Patient presents with  . Loss of Consciousness     (Consider location/radiation/quality/duration/timing/severity/associated sxs/prior Treatment) Patient is a 69 y.o. female presenting with syncope. The history is provided by the patient and the EMS personnel.  Loss of Consciousness Associated symptoms: confusion   Associated symptoms: no chest pain, no fever, no headaches, no nausea, no shortness of breath and no vomiting    patient does not have good recollection of the events. Was found by a neighbor slumped over in her car not responsive. Patient has COPD and is supposed to be on oxygen she was not on oxygen. EMS noted that she was hypoxic upon their arrival and diaphoretic I she sets were 70%. Upon arrival here patient seems somnolent old bit lethargic was alert and would answer some questions but did not seem to be completely oriented. Patient had no specific complaints. Patient denied taking too much of any of her meds. She does have Xanax and pain medications listed in the past.  Past Medical History  Diagnosis Date  . Diabetes mellitus   . COPD (chronic obstructive pulmonary disease)     sees Dr. [name]  . Sleep apnea, obstructive   . Pulmonary HTN   . Obesity (BMI 30.0-34.9)   . Hyperlipidemia   . Myocardial infarction   . Coronary artery disease   . Anxiety   . Shortness of breath   . GERD (gastroesophageal reflux disease)   . Arthritis     rheumatoid, sees Dr. Tobie Lords  . CHF (congestive heart failure)   . Pneumonia   . Bronchitis     hx of   . History of hiatal hernia    Past Surgical History  Procedure Laterality Date  . Appendectomy    . Tonsillectomy and adenoidectomy    . Lumbar disc surgery    . Hemorrhoid surgery    . Coronary angioplasty    . Endarterectomy Left 12/04/2012    Procedure: ENDARTERECTOMY  CAROTID;  Surgeon: Mal Misty, MD;  Location: [place];  Service: Vascular;  Laterality: Left;  . Patch angioplasty Left 12/04/2012    Procedure: PATCH ANGIOPLASTY;  Surgeon: Mal Misty, MD;  Location: [place];  Service: Vascular;  Laterality: Left;  Marland Kitchen Eye surgery      bilat cataract surg   . Colonscopy     . Total hip arthroplasty Left 07/30/2014    Procedure: LEFT TOTAL HIP ARTHROPLASTY ANTERIOR APPROACH;  Surgeon: Meredith Pel, MD;  Location: [facility];  Service: Orthopedics;  Laterality: Left;   Family History  Problem Relation Age of Onset  . Hypertension Mother   . Hyperlipidemia Mother   . Stroke Mother   . Heart attack Mother   . Cancer Mother   . Heart attack Father   . Cancer Brother   . Asthma Maternal Grandfather    History  Substance Use Topics  . Smoking status: Former Smoker -- 2.00 packs/day for 36 years    Types: Cigarettes    Quit date: 02/02/2013  . Smokeless tobacco: Never Used     Comment: had restarted and quit again 01/27/12   . Alcohol Use: 0.0 oz/week    0 Standard drinks or equivalent per week     Comment: rare   OB History    No data available  Review of Systems  Constitutional: Negative for fever.  HENT: Negative for congestion.   Eyes: Negative for visual disturbance.  Respiratory: Negative for shortness of breath.   Cardiovascular: Positive for syncope. Negative for chest pain.  Gastrointestinal: Negative for nausea, vomiting and abdominal pain.  Genitourinary: Negative for dysuria.  Musculoskeletal: Negative for back pain.  Skin: Negative for rash.  Neurological: Negative for headaches.  Hematological: Does not bruise/bleed easily.  Psychiatric/Behavioral: Positive for confusion.      Allergies  Codeine  Home Medications   Prior to Admission medications   Medication Sig Start Date End Date Taking? Authorizing Provider  albuterol (PROVENTIL HFA;VENTOLIN HFA) 108 (90 BASE) MCG/ACT inhaler Inhale 2 puffs into the lungs every 6  (six) hours as needed for wheezing or shortness of breath.    Historical Provider, MD  ALPRAZolam Duanne Moron) 0.5 MG tablet TAKE 1 TABLET BY MOUTH 3 TIMES A DAY AS NEEDED 10/01/14   [name], MD  atorvastatin (LIPITOR) 20 MG tablet Take 1 tablet (20 mg total) by mouth daily. Patient taking differently: Take 20 mg by mouth at bedtime.  02/23/14   [name], MD  budesonide-formoterol [facility]) 160-4.5 MCG/ACT inhaler Inhale 2 puffs into the lungs 2 (two) times daily. 10/26/13   Tammy S Parrett, NP  DULoxetine (CYMBALTA) 60 MG capsule Take 1 capsule (60 mg total) by mouth daily. 10/22/13   [name], MD  furosemide (LASIX) 40 MG tablet Take 40 mg by mouth every morning.  12/27/12   [name], MD  gabapentin (NEURONTIN) 300 MG capsule Take 1 capsule (300 mg total) by mouth 3 (three) times daily. 02/23/14   [name], MD  glipiZIDE (GLUCOTROL) 10 MG tablet TAKE 1 TABLET (10 MG TOTAL) BY MOUTH 2 (TWO) TIMES DAILY BEFORE A MEAL. 06/20/14   [name], MD  glucose blood (ACCU-CHEK AVIVA PLUS) test strip Test once per day and diagnosis code is 250.00 Patient taking differently: 1 each by Other route every morning. Test once per day and diagnosis code is 250.00 02/23/14   [name], MD  ibuprofen (ADVIL,MOTRIN) 800 MG tablet Take 1 tablet (800 mg total) by mouth 3 (three) times daily. 09/16/14   Noemi Chapel, MD  metFORMIN (GLUCOPHAGE) 1000 MG tablet Take 1,000 mg by mouth 2 (two) times daily with a meal.    Historical Provider, MD  methocarbamol (ROBAXIN) 500 MG tablet Take 1 tablet (500 mg total) by mouth 2 (two) times daily as needed for muscle spasms. 09/16/14   Noemi Chapel, MD  omeprazole (PRILOSEC) 40 MG capsule Take 1 capsule (40 mg total) by mouth daily before breakfast. 02/23/14   [name], MD  OVER THE COUNTER MEDICATION Place 1-2 drops into both eyes daily as needed (dry eyes.). Soothe.    Historical Provider, MD  oxyCODONE (OXY IR/ROXICODONE) 5 MG immediate release tablet Take 1-2  tablets (5-10 mg total) by mouth every 3 (three) hours as needed for breakthrough pain. 08/02/14   Meredith Pel, MD  rivaroxaban (XARELTO) 10 MG TABS tablet Take 1 tablet (10 mg total) by mouth daily with breakfast. 08/02/14   Meredith Pel, MD  sitaGLIPtin (JANUVIA) 100 MG tablet Take 1 tablet (100 mg total) by mouth daily. 06/04/14   [name], MD  solifenacin (VESICARE) 10 MG tablet Take 1 tablet (10 mg total) by mouth daily. 06/04/14   [name], MD  spironolactone (ALDACTONE) 25 MG tablet Take 25 mg by mouth every morning.  Historical Provider, MD   BP 125/70 mmHg  Pulse 91  Temp(Src) 98.1 F (36.7 C) (Oral)  Resp 10  SpO2 95% Physical Exam  Constitutional: She appears well-developed and well-nourished. No distress.  HENT:  Head: Normocephalic and atraumatic.  Mouth/Throat: Oropharynx is clear and moist.  Eyes: Conjunctivae are normal. Pupils are equal, round, and reactive to light.  Neck: Normal range of motion. Neck supple.  Cardiovascular: Normal rate, regular rhythm and normal heart sounds.   Pulmonary/Chest: Effort normal and breath sounds normal. No respiratory distress. She has no wheezes.  Abdominal: Soft. Bowel sounds are normal. There is no tenderness.  Musculoskeletal: Normal range of motion.  Neurological: No cranial nerve deficit. She exhibits normal muscle tone. Coordination normal.  Drowsy and somewhat lethargic. But will answer questions at times but drifts off during conversations.  Skin: Skin is warm. No rash noted.  Nursing note and vitals reviewed.   ED Course  Procedures (including critical care time) Labs Review Labs Reviewed  COMPREHENSIVE METABOLIC PANEL - Abnormal; Notable for the following:    Chloride 99 (*)    Glucose, Bld 112 (*)    Creatinine, Ser 1.05 (*)    ALT 11 (*)    GFR calc non Af Amer 53 (*)    All other components within normal limits  LIPASE, BLOOD - Abnormal; Notable for the following:    Lipase 19 (*)    All other  components within normal limits  CBC WITH DIFFERENTIAL/PLATELET - Abnormal; Notable for the following:    Hemoglobin 9.4 (*)    HCT 33.7 (*)    MCH 22.0 (*)    MCHC 27.9 (*)    RDW 20.0 (*)    All other components within normal limits  CK - Abnormal; Notable for the following:    Total CK 22 (*)    All other components within normal limits  I-STAT ARTERIAL BLOOD GAS, ED - Abnormal; Notable for the following:    pCO2 arterial 57.7 (*)    Bicarbonate 33.6 (*)    Acid-Base Excess 7.0 (*)    All other components within normal limits  TROPONIN I  URINALYSIS, ROUTINE W REFLEX MICROSCOPIC (NOT AT [facility])  CBG MONITORING, ED   Results for orders placed or performed during the hospital encounter of 11/17/14  Comprehensive metabolic panel  Result Value Ref Range   Sodium 136 135 - 145 mmol/L   Potassium 4.6 3.5 - 5.1 mmol/L   Chloride 99 (L) 101 - 111 mmol/L   CO2 29 22 - 32 mmol/L   Glucose, Bld 112 (H) 65 - 99 mg/dL   BUN 15 6 - 20 mg/dL   Creatinine, Ser 1.05 (H) 0.44 - 1.00 mg/dL   Calcium 8.9 8.9 - 10.3 mg/dL   Total Protein 6.9 6.5 - 8.1 g/dL   Albumin 3.5 3.5 - 5.0 g/dL   AST 17 15 - 41 U/L   ALT 11 (L) 14 - 54 U/L   Alkaline Phosphatase 72 38 - 126 U/L   Total Bilirubin 0.7 0.3 - 1.2 mg/dL   GFR calc non Af Amer 53 (L) >60 mL/min   GFR calc Af Amer >60 >60 mL/min   Anion gap 8 5 - 15  Lipase, blood  Result Value Ref Range   Lipase 19 (L) 22 - 51 U/L  CBC with Differential/Platelet  Result Value Ref Range   WBC 5.7 4.0 - 10.5 K/uL   RBC 4.28 3.87 - 5.11 MIL/uL   Hemoglobin 9.4 (L) 12.0 -  15.0 g/dL   HCT 33.7 (L) 36.0 - 46.0 %   MCV 78.7 78.0 - 100.0 fL   MCH 22.0 (L) 26.0 - 34.0 pg   MCHC 27.9 (L) 30.0 - 36.0 g/dL   RDW 20.0 (H) 11.5 - 15.5 %   Platelets 182 150 - 400 K/uL   Neutrophils Relative % 52 43 - 77 %   Neutro Abs 3.0 1.7 - 7.7 K/uL   Lymphocytes Relative 33 12 - 46 %   Lymphs Abs 1.9 0.7 - 4.0 K/uL   Monocytes Relative 11 3 - 12 %   Monocytes Absolute  0.6 0.1 - 1.0 K/uL   Eosinophils Relative 4 0 - 5 %   Eosinophils Absolute 0.3 0.0 - 0.7 K/uL   Basophils Relative 0 0 - 1 %   Basophils Absolute 0.0 0.0 - 0.1 K/uL  Troponin I  Result Value Ref Range   Troponin I <0.03 <0.031 ng/mL  CK  Result Value Ref Range   Total CK 22 (L) 38 - 234 U/L  I-Stat Arterial Blood Gas, ED - (order at [facility] and [facility] only)  Result Value Ref Range   pH, Arterial 7.373 7.350 - 7.450   pCO2 arterial 57.7 (HH) 35.0 - 45.0 mmHg   pO2, Arterial 100.0 80.0 - 100.0 mmHg   Bicarbonate 33.6 (H) 20.0 - 24.0 mEq/L   TCO2 35 0 - 100 mmol/L   O2 Saturation 97.0 %   Acid-Base Excess 7.0 (H) 0.0 - 2.0 mmol/L   Patient temperature 98.6 F    Collection site RADIAL, ALLEN'S TEST ACCEPTABLE    Drawn by Operator    Sample type ARTERIAL    Comment MD NOTIFIED, REPEAT TEST      Imaging Review Ct Head Wo Contrast  11/17/2014   CLINICAL DATA:  Syncope with hypoxic episode  EXAM: CT HEAD WITHOUT CONTRAST  TECHNIQUE: Contiguous axial images were obtained from the base of the skull through the vertex without intravenous contrast.  COMPARISON:  February 01, 2012  FINDINGS: The ventricles are normal in size and configuration. There is no intracranial mass, hemorrhage, extra-axial fluid collection, or midline shift. 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 Elsewhere gray-white compartments appear normal. No acute infarct apparent. The bony calvarium appears intact. The mastoid air cells are clear. There is mucosal thickening in multiple ethmoid air cells bilaterally.  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 Elsewhere gray-white compartments appear normal. No acute infarct. No hemorrhage or mass effect. Mucosal thickening in multiple ethmoid air cells bilaterally.   Electronically Signed   By: Lowella Grip III M.D.   On: 11/17/2014 20:21   Dg Chest Port 1 View  11/17/2014   CLINICAL DATA:  Shortness of breath,  syncope  EXAM: PORTABLE CHEST - 1 VIEW  COMPARISON:  07/21/2014  FINDINGS: Cardiomediastinal silhouette is stable. Mild hyperinflation again noted. No acute infiltrate or pulmonary edema. Stable scarring and mild atelectasis in lingula.  IMPRESSION: No active disease.  No significant change.   Electronically Signed   By: Lahoma Crocker M.D.   On: 11/17/2014 19:11     EKG Interpretation   Date/Time:  Wednesday November 17 2014 17:32:40 EDT Ventricular Rate:  97 PR Interval:  160 QRS Duration: 149 QT Interval:  403 QTC Calculation: 512 R Axis:   87 Text Interpretation:  Sinus rhythm Right bundle branch block No  significant change since last tracing Confirmed by Adriauna Campton  MD, Damontay Alred  (  [id number]) on 11/17/2014 5:59:35 PM      MDM   Final diagnoses:  SOB (shortness of breath)  Syncope  Altered mental status, unspecified altered mental status type    Patient found by neighbors slumped over in her car not the responsive. Patient apparently was hypoxic with oxygen levels in the 70s. She was diaphoretic. Patient did not have her oxygen on and she supposed to wear 3 L of oxygen during the day and 4 L at night. Upon arrival patient was lethargic and drowsy. Somewhat oriented but not completely oriented. Workup here without any significant findings head CT negative chest x-ray negative troponin negative EKG without any arrhythmias. Does have a right bundle branch block. CBC and electrolytes without significant abnormalities to include liver function tests. Patient still somewhat drowsy but more alert. Patient's arterial blood gas may be somewhat baseline for her. Normal pH PCO2 elevated [place] 57.7. PO2 was 100 on her 3 L  We'll contact the hospitalist regarding admission. Patient is followed by Dr. Sarajane Jews.    Fredia Sorrow, MD 11/17/14 2034

## 2014-11-17 NOTE — H&P (Signed)
[facility] Admission History and Physical       Jillian Bright BZJ:[id number] DOB: 02-04-1946 DOA: 11/17/2014  Referring physician: EDP PCP: [name], MD  Specialists:   Chief Complaint: Found Slumped over in her Car  HPI: Jillian Bright is a 69 y.o. female with a history of O2 dependent COPD ( 3-4 Liters), CHF, CAD, HTN, Pulmonary HTN, DM2, Rheumatoid Arthritis, and OSA who was found slumped over in her parked car by her neighbors who called EMS.  EMS Found her to be hypoxic into the 70's and she was not wearing her NCO2. And she was placed on 4 liters NCO2 and had some improvement in her alertnsss.   In the ED, and ABG was performed in the ED and she was found to have CO2 Retention with a pCO2 of 57.7 however per her previous admissions, she has had higher levels of CO2 retention in the past.    She remained lethargic but arousable and was placed on BiPAP and referred for admission.      Review of Systems: Unable to Obtain from the Patient  Past Medical History  Diagnosis Date  . Diabetes mellitus   . COPD (chronic obstructive pulmonary disease)     sees Dr. [name]  . Sleep apnea, obstructive   . Pulmonary HTN   . Obesity (BMI 30.0-34.9)   . Hyperlipidemia   . Myocardial infarction   . Coronary artery disease   . Anxiety   . Shortness of breath   . GERD (gastroesophageal reflux disease)   . Arthritis     rheumatoid, sees Dr. Tobie Lords  . CHF (congestive heart failure)   . Pneumonia   . Bronchitis     hx of   . History of hiatal hernia      Past Surgical History  Procedure Laterality Date  . Appendectomy    . Tonsillectomy and adenoidectomy    . Lumbar disc surgery    . Hemorrhoid surgery    . Coronary angioplasty    . Endarterectomy Left 12/04/2012    Procedure: ENDARTERECTOMY CAROTID;  Surgeon: Mal Misty, MD;  Location: [place];  Service: Vascular;  Laterality: Left;  . Patch angioplasty Left 12/04/2012    Procedure: PATCH ANGIOPLASTY;   Surgeon: Mal Misty, MD;  Location: [place];  Service: Vascular;  Laterality: Left;  Marland Kitchen Eye surgery      bilat cataract surg   . Colonscopy     . Total hip arthroplasty Left 07/30/2014    Procedure: LEFT TOTAL HIP ARTHROPLASTY ANTERIOR APPROACH;  Surgeon: Meredith Pel, MD;  Location: [facility];  Service: Orthopedics;  Laterality: Left;      Prior to Admission medications   Medication Sig Start Date End Date Taking? Authorizing Provider  albuterol (PROVENTIL HFA;VENTOLIN HFA) 108 (90 BASE) MCG/ACT inhaler Inhale 2 puffs into the lungs every 6 (six) hours as needed for wheezing or shortness of breath.    Historical Provider, MD  ALPRAZolam Duanne Moron) 0.5 MG tablet TAKE 1 TABLET BY MOUTH 3 TIMES A DAY AS NEEDED 10/01/14   [name], MD  atorvastatin (LIPITOR) 20 MG tablet Take 1 tablet (20 mg total) by mouth daily. Patient taking differently: Take 20 mg by mouth at bedtime.  02/23/14   [name], MD  budesonide-formoterol [facility]) 160-4.5 MCG/ACT inhaler Inhale 2 puffs into the lungs 2 (two) times daily. 10/26/13   Tammy S Parrett, NP  DULoxetine (CYMBALTA) 60 MG capsule Take 1 capsule (60 mg  total) by mouth daily. 10/22/13   [name], MD  furosemide (LASIX) 40 MG tablet Take 40 mg by mouth every morning.  12/27/12   [name], MD  gabapentin (NEURONTIN) 300 MG capsule Take 1 capsule (300 mg total) by mouth 3 (three) times daily. 02/23/14   [name], MD  glipiZIDE (GLUCOTROL) 10 MG tablet TAKE 1 TABLET (10 MG TOTAL) BY MOUTH 2 (TWO) TIMES DAILY BEFORE A MEAL. 06/20/14   [name], MD  glucose blood (ACCU-CHEK AVIVA PLUS) test strip Test once per day and diagnosis code is 250.00 Patient taking differently: 1 each by Other route every morning. Test once per day and diagnosis code is 250.00 02/23/14   [name], MD  ibuprofen (ADVIL,MOTRIN) 800 MG tablet Take 1 tablet (800 mg total) by mouth 3 (three) times daily. 09/16/14   Noemi Chapel, MD  metFORMIN (GLUCOPHAGE) 1000 MG  tablet Take 1,000 mg by mouth 2 (two) times daily with a meal.    Historical Provider, MD  methocarbamol (ROBAXIN) 500 MG tablet Take 1 tablet (500 mg total) by mouth 2 (two) times daily as needed for muscle spasms. 09/16/14   Noemi Chapel, MD  omeprazole (PRILOSEC) 40 MG capsule Take 1 capsule (40 mg total) by mouth daily before breakfast. 02/23/14   [name], MD  OVER THE COUNTER MEDICATION Place 1-2 drops into both eyes daily as needed (dry eyes.). Soothe.    Historical Provider, MD  oxyCODONE (OXY IR/ROXICODONE) 5 MG immediate release tablet Take 1-2 tablets (5-10 mg total) by mouth every 3 (three) hours as needed for breakthrough pain. 08/02/14   Meredith Pel, MD  rivaroxaban (XARELTO) 10 MG TABS tablet Take 1 tablet (10 mg total) by mouth daily with breakfast. 08/02/14   Meredith Pel, MD  sitaGLIPtin (JANUVIA) 100 MG tablet Take 1 tablet (100 mg total) by mouth daily. 06/04/14   [name], MD  solifenacin (VESICARE) 10 MG tablet Take 1 tablet (10 mg total) by mouth daily. 06/04/14   [name], MD  spironolactone (ALDACTONE) 25 MG tablet Take 25 mg by mouth every morning.     Historical Provider, MD     Allergies  Allergen Reactions  . Codeine Itching    Takes vicodin at home    Social History:  reports that she quit smoking about 21 months ago. Her smoking use included Cigarettes. She has a 72 pack-year smoking history. She has never used smokeless tobacco. She reports that she drinks alcohol. She reports that she does not use illicit drugs.    Family History  Problem Relation Age of Onset  . Hypertension Mother   . Hyperlipidemia Mother   . Stroke Mother   . Heart attack Mother   . Cancer Mother   . Heart attack Father   . Cancer Brother   . Asthma Maternal Grandfather        Physical Exam:  GEN: Somnolent Obese Elderly  69 y.o. Caucasian female examined and in no acute distress; cooperative with exam Filed Vitals:   11/17/14 1945 11/17/14 2030 11/17/14 2045  11/17/14 2100  BP: 125/70 126/70 121/69 107/71  Pulse: 91 92 92 91  Temp:      TempSrc:      Resp: '10 9  10  '$ SpO2: 95% 90% 90% 90%   Blood pressure 107/71, pulse 91, temperature 98.1 F (36.7 C), temperature source Oral, resp. rate 10, SpO2 90 %. PSYCH: She is alert and oriented x4;  does not appear anxious does not appear depressed; affect is normal HEENT: Normocephalic and Atraumatic, Mucous membranes pink; PERRLA; EOM intact; Fundi:  Benign;  No scleral icterus, Nares: Patent, Oropharynx: Clear,     Neck:  FROM, No Cervical Lymphadenopathy nor Thyromegaly or Carotid Bruit; No JVD; Breasts:: Not examined CHEST WALL: No tenderness CHEST: Normal respiration, clear to auscultation bilaterally HEART: Regular rate and rhythm; no murmurs rubs or gallops BACK: No kyphosis or scoliosis; No CVA tenderness ABDOMEN: Positive Bowel Sounds, Obese, Soft Non-Tender, No Rebound or Guarding; No Masses, No Organomegaly. Rectal Exam: Not done EXTREMITIES: No Cyanosis, Clubbing, or Edema; No Ulcerations. Genitalia: not examined PULSES: 2+ and symmetric SKIN: Normal hydration no rash or ulceration CNS:  Alert and Oriented x 4, Lethargic and somnolent but  No Focal Deficits Vascular: pulses palpable throughout    Labs on Admission:  Basic Metabolic Panel:  Recent Labs Lab 11/17/14 1829  NA 136  K 4.6  CL 99*  CO2 29  GLUCOSE 112*  BUN 15  CREATININE 1.05*  CALCIUM 8.9   Liver Function Tests:  Recent Labs Lab 11/17/14 1829  AST 17  ALT 11*  ALKPHOS 72  BILITOT 0.7  PROT 6.9  ALBUMIN 3.5    Recent Labs Lab 11/17/14 1829  LIPASE 19*   No results for input(s): AMMONIA in the last 168 hours. CBC:  Recent Labs Lab 11/17/14 1829  WBC 5.7  NEUTROABS 3.0  HGB 9.4*  HCT 33.7*  MCV 78.7  PLT 182   Cardiac Enzymes:  Recent Labs Lab 11/17/14 1829  CKTOTAL 94*  TROPONINI <0.03    BNP (last 3 results) No results for input(s): BNP in the last 8760 hours.  ProBNP (last  3 results)  Recent Labs  01/28/14 2215  PROBNP 1016.0*    CBG: No results for input(s): GLUCAP in the last 168 hours.  Radiological Exams on Admission: Ct Head Wo Contrast  11/17/2014   CLINICAL DATA:  Syncope with hypoxic episode  EXAM: CT HEAD WITHOUT CONTRAST  TECHNIQUE: Contiguous axial images were obtained from the base of the skull through the vertex without intravenous contrast.  COMPARISON:  February 01, 2012  FINDINGS: The ventricles are normal in size and configuration. There is no intracranial mass, hemorrhage, extra-axial fluid collection, or midline shift. 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 Elsewhere gray-white compartments appear normal. No acute infarct apparent. The bony calvarium appears intact. The mastoid air cells are clear. There is mucosal thickening in multiple ethmoid air cells bilaterally.  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 Elsewhere gray-white compartments appear normal. No acute infarct. No hemorrhage or mass effect. Mucosal thickening in multiple ethmoid air cells bilaterally.   Electronically Signed   By: Lowella Grip III M.D.   On: 11/17/2014 20:21   Dg Chest Port 1 View  11/17/2014   CLINICAL DATA:  Shortness of breath, syncope  EXAM: PORTABLE CHEST - 1 VIEW  COMPARISON:  07/21/2014  FINDINGS: Cardiomediastinal silhouette is stable. Mild hyperinflation again noted. No acute infiltrate or pulmonary edema. Stable scarring and mild atelectasis in lingula.  IMPRESSION: No active disease.  No significant change.   Electronically Signed   By: Lahoma Crocker M.D.   On: 11/17/2014 19:11     EKG: Independently reviewed. Sinus Rhythm with rate = 97 +RBBB   Assessment/Plan:   69 y.o. female with  Principal Problem:   1.   Syncope - due to Hypoxia  and Hypercapnea   Cardiac Monitoring   BiPAP   Neuro Checks           Active Problems:   2.   Acute on chronic respiratory failure  with hypercapnia   BiPAP   PCCM contacted for Adjustements in BiPAP    Worsening on ABGs     3.   Generalized anxiety disorder   Monitor     4.   Congestive heart failure   No evidence on Chest X-ray,    Monitor For signs of Fluid Overload         5.   COPD with emphysema   Duonebs q 4 hrs     6.   CAD (coronary artery disease)   Cardiac monitoring        7.   Rheumatoid arthritis   On  Ibuprofen at Home        8.   DVT Prophylaxis   On Xarelto Rx    9.  GI Prophylaxis    IV Protonix        Code Status:     FULL CODE       Family Communication:    No Family Present    Disposition Plan:    Inpatient  Status        Time spent:  [facility] Pager [phone]   If [place] Please Contact the Day Rounding Team MD for [facility]  If 7PM-7AM, Please Contact Night-Floor Coverage  [email or web address] Password [email or web address] 11/17/2014, 9:22 PM     ADDENDUM:   Patient was seen and examined on 11/17/2014

## 2014-11-18 DIAGNOSIS — E119 Type 2 diabetes mellitus without complications: Secondary | ICD-10-CM

## 2014-11-18 DIAGNOSIS — L8990 Pressure ulcer of unspecified site, unspecified stage: Secondary | ICD-10-CM | POA: Diagnosis present

## 2014-11-18 DIAGNOSIS — E662 Morbid (severe) obesity with alveolar hypoventilation: Secondary | ICD-10-CM

## 2014-11-18 DIAGNOSIS — I5032 Chronic diastolic (congestive) heart failure: Secondary | ICD-10-CM

## 2014-11-18 DIAGNOSIS — J438 Other emphysema: Secondary | ICD-10-CM

## 2014-11-18 DIAGNOSIS — R4182 Altered mental status, unspecified: Secondary | ICD-10-CM

## 2014-11-18 DIAGNOSIS — J9622 Acute and chronic respiratory failure with hypercapnia: Secondary | ICD-10-CM

## 2014-11-18 LAB — CBC
HCT: 31.7 % — ABNORMAL LOW (ref 36.0–46.0)
Hemoglobin: 8.8 g/dL — ABNORMAL LOW (ref 12.0–15.0)
MCH: 22.3 pg — AB (ref 26.0–34.0)
MCHC: 27.8 g/dL — AB (ref 30.0–36.0)
MCV: 80.5 fL (ref 78.0–100.0)
PLATELETS: 179 10*3/uL (ref 150–400)
RBC: 3.94 MIL/uL (ref 3.87–5.11)
RDW: 20.1 % — ABNORMAL HIGH (ref 11.5–15.5)
WBC: 4.2 10*3/uL (ref 4.0–10.5)

## 2014-11-18 LAB — BLOOD GAS, ARTERIAL
ACID-BASE EXCESS: 4 mmol/L — AB (ref 0.0–2.0)
ACID-BASE EXCESS: 4.8 mmol/L — AB (ref 0.0–2.0)
Acid-Base Excess: 5.8 mmol/L — ABNORMAL HIGH (ref 0.0–2.0)
Bicarbonate: 30.2 mEq/L — ABNORMAL HIGH (ref 20.0–24.0)
Bicarbonate: 32.1 mEq/L — ABNORMAL HIGH (ref 20.0–24.0)
Bicarbonate: 30.2 mEq/L — ABNORMAL HIGH (ref 20.0–24.0)
DELIVERY SYSTEMS: POSITIVE
Delivery systems: POSITIVE
Delivery systems: POSITIVE
Drawn by: 42624
Drawn by: 42624
Drawn by: 42624
Expiratory PAP: 6
Expiratory PAP: 6
Expiratory PAP: 8
FIO2: 0.4 %
FIO2: 0.4 %
FIO2: 0.4 %
Inspiratory PAP: 12
Inspiratory PAP: 12
Inspiratory PAP: 16
O2 Saturation: 94.7 %
O2 Saturation: 94.9 %
O2 Saturation: 96 %
PCO2 ART: 65.6 mmHg — AB (ref 35.0–45.0)
PO2 ART: 85.2 mmHg (ref 80.0–100.0)
Patient temperature: 98.6
Patient temperature: 98.6
Patient temperature: 98.6
RATE: 12 resp/min
RATE: 12 resp/min
RATE: 18 resp/min
TCO2: 32.2 mmol/L (ref 0–100)
TCO2: 34.2 mmol/L (ref 0–100)
TCO2: 31.9 mmol/L (ref 0–100)
pCO2 arterial: 56.8 mmHg — ABNORMAL HIGH (ref 35.0–45.0)
pH, Arterial: 7.285 — ABNORMAL LOW (ref 7.350–7.450)
pH, Arterial: 7.293 — ABNORMAL LOW (ref 7.350–7.450)
pH, Arterial: 7.345 — ABNORMAL LOW (ref 7.350–7.450)
pO2, Arterial: 83.9 mmHg (ref 80.0–100.0)
pO2, Arterial: 78.6 mmHg — ABNORMAL LOW (ref 80.0–100.0)

## 2014-11-18 LAB — URINALYSIS, ROUTINE W REFLEX MICROSCOPIC
Bilirubin Urine: NEGATIVE
Glucose, UA: NEGATIVE mg/dL
Hgb urine dipstick: NEGATIVE
Ketones, ur: NEGATIVE mg/dL
NITRITE: POSITIVE — AB
Protein, ur: NEGATIVE mg/dL
SPECIFIC GRAVITY, URINE: 1.018 (ref 1.005–1.030)
Urobilinogen, UA: 0.2 mg/dL (ref 0.0–1.0)
pH: 5.5 (ref 5.0–8.0)

## 2014-11-18 LAB — GLUCOSE, CAPILLARY
GLUCOSE-CAPILLARY: 189 mg/dL — AB (ref 65–99)
Glucose-Capillary: 148 mg/dL — ABNORMAL HIGH (ref 65–99)
Glucose-Capillary: 162 mg/dL — ABNORMAL HIGH (ref 65–99)

## 2014-11-18 LAB — BASIC METABOLIC PANEL
ANION GAP: 5 (ref 5–15)
BUN: 11 mg/dL (ref 6–20)
CHLORIDE: 102 mmol/L (ref 101–111)
CO2: 32 mmol/L (ref 22–32)
Calcium: 8.6 mg/dL — ABNORMAL LOW (ref 8.9–10.3)
Creatinine, Ser: 0.93 mg/dL (ref 0.44–1.00)
GFR calc non Af Amer: >60 mL/min (ref >60)
Glucose, Bld: 83 mg/dL (ref 65–99)
Potassium: 4.5 mmol/L (ref 3.5–5.1)
SODIUM: 139 mmol/L (ref 135–145)

## 2014-11-18 LAB — VITAMIN B12: Vitamin B-12: 184 pg/mL (ref 180–914)

## 2014-11-18 LAB — URINE MICROSCOPIC-ADD ON

## 2014-11-18 LAB — BRAIN NATRIURETIC PEPTIDE: B Natriuretic Peptide: 176.5 pg/mL — ABNORMAL HIGH (ref 0.0–100.0)

## 2014-11-18 LAB — MRSA PCR SCREENING: MRSA BY PCR: NEGATIVE

## 2014-11-18 MED ORDER — ENOXAPARIN SODIUM 40 MG/0.4ML ~~LOC~~ SOLN
40.0000 mg | SUBCUTANEOUS | Status: DC
  Administered 2014-11-19: 40 mg via SUBCUTANEOUS
  Filled 2014-11-18: qty 0.4

## 2014-11-18 MED ORDER — FUROSEMIDE 40 MG PO TABS
40.0000 mg | ORAL_TABLET | Freq: Every morning | ORAL | Status: DC
  Administered 2014-11-18 – 2014-11-19 (×2): 40 mg via ORAL
  Filled 2014-11-18 (×2): qty 1

## 2014-11-18 MED ORDER — INSULIN ASPART 100 UNIT/ML ~~LOC~~ SOLN: 0.0000 [IU] | Freq: Every day | SUBCUTANEOUS | Status: DC

## 2014-11-18 MED ORDER — OXYBUTYNIN CHLORIDE ER 10 MG PO TB24
10.0000 mg | ORAL_TABLET | Freq: Every day | ORAL | Status: DC
  Administered 2014-11-18: 10 mg via ORAL
  Filled 2014-11-18 (×2): qty 1

## 2014-11-18 MED ORDER — SPIRONOLACTONE 25 MG PO TABS
25.0000 mg | ORAL_TABLET | Freq: Every morning | ORAL | Status: DC
  Administered 2014-11-18 – 2014-11-19 (×2): 25 mg via ORAL
  Filled 2014-11-18 (×2): qty 1

## 2014-11-18 MED ORDER — IPRATROPIUM-ALBUTEROL 0.5-2.5 (3) MG/3ML IN SOLN: 3.0000 mL | Freq: Four times a day (QID) | RESPIRATORY_TRACT | Status: DC | PRN

## 2014-11-18 MED ORDER — BUDESONIDE-FORMOTEROL FUMARATE 160-4.5 MCG/ACT IN AERO
2.0000 | INHALATION_SPRAY | Freq: Two times a day (BID) | RESPIRATORY_TRACT | Status: DC
  Administered 2014-11-18 (×2): 2 via RESPIRATORY_TRACT
  Filled 2014-11-18: qty 6

## 2014-11-18 MED ORDER — DULOXETINE HCL 60 MG PO CPEP
60.0000 mg | ORAL_CAPSULE | Freq: Every day | ORAL | Status: DC
  Administered 2014-11-18 – 2014-11-19 (×2): 60 mg via ORAL
  Filled 2014-11-18 (×2): qty 1

## 2014-11-18 MED ORDER — CEFTRIAXONE SODIUM IN DEXTROSE 20 MG/ML IV SOLN
1.0000 g | INTRAVENOUS | Status: DC
Start: 2014-11-18 — End: 2014-11-19
  Administered 2014-11-18: 1 g via INTRAVENOUS
  Filled 2014-11-18 (×2): qty 50

## 2014-11-18 MED ORDER — PANTOPRAZOLE SODIUM 40 MG PO TBEC
40.0000 mg | DELAYED_RELEASE_TABLET | Freq: Every day | ORAL | Status: DC
  Administered 2014-11-18 – 2014-11-19 (×2): 40 mg via ORAL
  Filled 2014-11-18 (×2): qty 1

## 2014-11-18 MED ORDER — ATORVASTATIN CALCIUM 20 MG PO TABS
20.0000 mg | ORAL_TABLET | Freq: Every day | ORAL | Status: DC
  Administered 2014-11-18: 20 mg via ORAL
  Filled 2014-11-18 (×2): qty 1

## 2014-11-18 MED ORDER — INSULIN ASPART 100 UNIT/ML ~~LOC~~ SOLN
0.0000 [IU] | Freq: Three times a day (TID) | SUBCUTANEOUS | Status: DC
  Administered 2014-11-18: 2 [IU] via SUBCUTANEOUS
  Administered 2014-11-19: 1 [IU] via SUBCUTANEOUS

## 2014-11-18 MED ORDER — ALPRAZOLAM 0.5 MG PO TABS: 0.5000 mg | ORAL_TABLET | Freq: Two times a day (BID) | ORAL | Status: DC | PRN

## 2014-11-18 NOTE — Progress Notes (Signed)
Pt not on BIPAP at this time. Pt on 40% VM. No distress noted.

## 2014-11-18 NOTE — Progress Notes (Signed)
[facility] PROGRESS NOTE  Jillian Bright IEP:[id number] DOB: March 13, 1946 DOA: 11/17/2014 PCP: [name], MD  Assessment/Plan:  Principal Problem:   Syncope: secondary to hypoxia and hypercarbia.  Currently alert and oriented. Active Problems:  Acute on chronic respiratory failure with hypoxia and hypercapnia:  Currently off BiPAP on Ventimask. Continue step down monitoring. Increase activity.  Suspect related to noncompliance with oxygen, urinary tract infection may have contributed to encephalopathy. Start diet. Increase activity. PT eval. Walks with cane/walker and lives alone   Generalized anxiety disorder Chronic diastolic heart failure: no pulmonary edema on chest x-ray. Patient reports intermittent leg swelling. Echocardiogram last fall showed preserved ejection fraction. She's had multiple Dopplers of the legs which have been negative. Will check BNP. Weight is about baseline. Resume lasix   COPD with emphysema:  No wheeze or dyspnea currently.   CAD (coronary artery disease)   Rheumatoid arthritis UTI: will treat, as may have contributed to encephalopathy and ensuing hypercarbic respiratory failure. Send for cx   Pressure ulcer Anemia: No reported bleeding. Had mild iron deficiency with normal folate but no B-12 drawn last fall. Will check. Hemoccults stools. Type 2 diabetes:  SSI only for now GERD: change to PO PPI  Need to clarify why patient is on xarelto.  It appears this was started after Lt. Hip arthroplasty. Suspect for DVT prophylaxis. Will ask pharmacy to clarify  HPI/Subjective: Wondering when she could go home. Reports she was "taking a nap" yesterday when her neighbor found her. Denies SOB, cough, F/C, dysuria. Had been feeling fine prior to coming in to hospital  Objective: Filed Vitals:   11/18/14 0723  BP: 125/72  Pulse: 81  Temp:   Resp: 10    Intake/Output Summary (Last 24 hours) at 11/18/14 0832 Last data filed at 11/18/14 0750  Gross per 24  hour  Intake      0 ml  Output    375 ml  Net   -375 ml   Filed Weights   11/17/14 2225  Weight: 87.9 kg (193 lb 12.6 oz)    Exam:   General:  A and o on ventimask  Cardiovascular: RRR without MGR  Respiratory: diminished without WRR  Abdomen: Obese, s, nt, nd  Ext: trace edema on right. Minimal on left  Basic Metabolic Panel:  Recent Labs Lab 11/17/14 1829 11/18/14 0339  NA 136 139  K 4.6 4.5  CL 99* 102  CO2 29 32  GLUCOSE 112* 83  BUN 15 11  CREATININE 1.05* 0.93  CALCIUM 8.9 8.6*   Liver Function Tests:  Recent Labs Lab 11/17/14 1829  AST 17  ALT 11*  ALKPHOS 72  BILITOT 0.7  PROT 6.9  ALBUMIN 3.5    Recent Labs Lab 11/17/14 1829  LIPASE 19*   No results for input(s): AMMONIA in the last 168 hours. CBC:  Recent Labs Lab 11/17/14 1829 11/18/14 0339  WBC 5.7 4.2  NEUTROABS 3.0  --   HGB 9.4* 8.8*  HCT 33.7* 31.7*  MCV 78.7 80.5  PLT 182 179   Cardiac Enzymes:  Recent Labs Lab 11/17/14 1829  CKTOTAL 34*  TROPONINI <0.03   BNP (last 3 results) No results for input(s): BNP in the last 8760 hours.  ProBNP (last 3 results)  Recent Labs  01/28/14 2215  PROBNP 1016.0*    CBG: No results for input(s): GLUCAP in the last 168 hours.  Recent Results (from the past 240 hour(s))  MRSA PCR Screening     Status: None  Collection Time: 11/17/14 11:00 PM  Result Value Ref Range Status   MRSA by PCR NEGATIVE NEGATIVE Final    Comment:        The GeneXpert MRSA Assay (FDA approved for NASAL specimens only), is one component of a comprehensive MRSA colonization surveillance program. It is not intended to diagnose MRSA infection nor to guide or monitor treatment for MRSA infections.      Studies: Ct Head Wo Contrast  11/17/2014   CLINICAL DATA:  Syncope with hypoxic episode  EXAM: CT HEAD WITHOUT CONTRAST  TECHNIQUE: Contiguous axial images were obtained from the base of the skull through the vertex without intravenous  contrast.  COMPARISON:  February 01, 2012  FINDINGS: The ventricles are normal in size and configuration. There is no intracranial mass, hemorrhage, extra-axial fluid collection, or midline shift. 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 Elsewhere gray-white compartments appear normal. No acute infarct apparent. The bony calvarium appears intact. The mastoid air cells are clear. There is mucosal thickening in multiple ethmoid air cells bilaterally.  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 Elsewhere gray-white compartments appear normal. No acute infarct. No hemorrhage or mass effect. Mucosal thickening in multiple ethmoid air cells bilaterally.   Electronically Signed   By: Lowella Grip III M.D.   On: 11/17/2014 20:21   Dg Chest Port 1 View  11/17/2014   CLINICAL DATA:  Shortness of breath, syncope  EXAM: PORTABLE CHEST - 1 VIEW  COMPARISON:  07/21/2014  FINDINGS: Cardiomediastinal silhouette is stable. Mild hyperinflation again noted. No acute infiltrate or pulmonary edema. Stable scarring and mild atelectasis in lingula.  IMPRESSION: No active disease.  No significant change.   Electronically Signed   By: Lahoma Crocker M.D.   On: 11/17/2014 19:11    Scheduled Meds: . antiseptic oral rinse  7 mL Mouth Rinse q12n4p  . chlorhexidine  15 mL Mouth Rinse BID  . ipratropium-albuterol  3 mL Nebulization Q6H  . pantoprazole (PROTONIX) IV  40 mg Intravenous Q24H  . rivaroxaban  10 mg Oral Q breakfast  . sodium chloride  3 mL Intravenous Q12H   Continuous Infusions:   Time spent: 35 minutes  [facility] [email or web address], password [facility] 11/18/2014, 8:32 AM  LOS: 1 day

## 2014-11-18 NOTE — Care Management Note (Signed)
Case Management Note  Patient Details  Name: Jillian Bright MRN: [id number] Date of Birth: January 06, 1946  Subjective/Objective:                 Pt from home alone admitted with syncope/ sob, hx of COPD on home 02.   Action/Plan:  Return to home when medically stable. CM to f/u with discharge disposition. Expected Discharge Date:                  Expected Discharge Plan:  [place]  In-House Referral:     Discharge planning Services  CM Consult  Post Acute Care Choice:    Choice offered to:     DME Arranged:    DME Agency:     HH Arranged:    [place] Agency:     Status of Service:  In process, will continue to follow  Medicare Important Message Given:    Date Medicare IM Given:    Medicare IM give by:    Date Additional Medicare IM Given:    Additional Medicare Important Message give by:     If discussed at [place] of Stay Meetings, dates discussed:    Additional Comments:  Sharin Mons, RN 11/18/2014, 9:27 PM

## 2014-11-18 NOTE — Progress Notes (Addendum)
CRITICAL VALUE ALERT  Critical value received:  ABGs: pH 7.285; pCO2 65.6; pO2 83.9; Bicarb 30.2  Date of notification:  11/18/2014  Time of notification:  0003  Critical value read back:Yes.    Nurse who received alert:  Dorene Grebe, RN  MD notified (1st page):  [facility] Coverage Pager  Time of first page:  0015  MD notified (2nd page):  Time of second page:  Responding MD:  Chaney Malling, NP  Time MD responded:  [phone], orders for follow up ABG placed. Will remain on BiPAP, no settings changed. Will continue to monitor.

## 2014-11-18 NOTE — Progress Notes (Signed)
Patient is resting comfortably on 3L nasal cannula with no signs of respiratory distress. Sats are remaining 96-99%. Did not place patient on BIPAP at this time.

## 2014-11-18 NOTE — Progress Notes (Signed)
eLink Physician-Brief Progress Note Patient Name: Jillian Bright DOB: 06-18-1945 MRN: [id number]  Date of Service  11/18/2014   HPI/Events of Note    Recent Labs Lab 11/17/14 1843 11/17/14 2300 11/18/14 0350  PHART 7.373 7.285* 7.293*  PCO2ART 57.7* 65.6* CRITICAL RESULT CALLED TO, READ BACK BY AND VERIFIED WITH:  PO2ART 100.0 83.9 78.6*  HCO3 33.6* 30.2* 32.1*  TCO2 35 32.2 34.2  O2SAT 97.0 94.7 94.9     eICU Interventions  Slowly worsening hypercapnia - Dr Arnoldo Morale has asked for eMD help  Camera exam: patient is sleeping. Easily arousable and conversant  No resp distress or lethargy  Plan  - increase biPAP settings -  Repeat abg in 1h - if worsening - then intubate      Holbert Caples 11/18/2014, 4:27 AM

## 2014-11-18 NOTE — Progress Notes (Signed)
eLink Physician-Brief Progress Note Patient Name: Jillian Bright DOB: 05-Jan-1946 MRN: [id number]  Date of Service  11/18/2014   HPI/Events of Note    Recent Labs Lab 11/17/14 1843 11/17/14 2300 11/18/14 0350 11/18/14 0540  PHART 7.373 7.285* 7.293* 7.345*  PCO2ART 57.7* 65.6* CRITICAL RESULT CALLED TO, READ BACK BY AND VERIFIED WITH: 56.8*  PO2ART 100.0 83.9 78.6* 85.2  HCO3 33.6* 30.2* 32.1* 30.2*  TCO2 35 32.2 34.2 31.9  O2SAT 97.0 94.7 94.9 96.0     eICU Interventions  abg improved after better bipap setting  Plan Continue higher bipap settings Call bedside PCCM iff needed      Rolonda Pontarelli 11/18/2014, 6:13 AM

## 2014-11-18 NOTE — Progress Notes (Signed)
UR COMPLETED  

## 2014-11-18 NOTE — Consult Note (Addendum)
WOC wound consult note Reason for Consult: Consult requested for hip wound; there is nothing at this location and wound is actually to lower right inner buttock. Wound type: Patchy areas of partial thickness skin loss to lower buttock .5X.5X.1cm. Appearance is consistent with moisture-associated skin damage. Loose peeling dry scabbed skin surrounding. Pt states she was frequently incontinent of urine prior to admission. Pressure Ulcer POA: Present on admission, this is NOT a pressure ulcer Dressing procedure/placement/frequency: Foam dressing to protect and promote healing. Discussed plan of care and pt verbalizes understanding. Please re-consult if further assistance is needed. Thank-you,  Julien Girt MSN, [place], [place], [place], [place]

## 2014-11-18 NOTE — Progress Notes (Signed)
Physician notified: Conley Canal At: [phone]  Regarding:  Urine is very malodorous, want UA? Pt requesting to get off BiPAP, please let me know when OK to wean? Thanks. Awaiting return response.   Returned Response at: 19  Order(s): UA already ordered, will see patient to determine resp status. If continues to fight BiPAP attempt mask and monitor.  Placed patient on 40% venturi-mask. Tolerating well.

## 2014-11-18 NOTE — Progress Notes (Signed)
CRITICAL VALUE ALERT  Critical value received:  ABGs: pH 7.293, pCO2 68.4, pO2 78.6, Bicarb 32.1  Date of notification:  11/18/2014  Time of notification:  0406  Critical value read back:Yes.    Nurse who received alert:  Dorene Grebe, RN  MD notified (1st page):  Warren Lacy, Dr. Chase Caller  Time of first page:  0408  MD notified (2nd page):  Time of second page:  Responding MD:  Dr. Chase Caller  Time MD responded:  [phone], RT to call eLink to adjust BiPAP settings. Will continue to monitor.

## 2014-11-19 LAB — BASIC METABOLIC PANEL
ANION GAP: 8 (ref 5–15)
BUN: 9 mg/dL (ref 6–20)
CO2: 34 mmol/L — ABNORMAL HIGH (ref 22–32)
Calcium: 9 mg/dL (ref 8.9–10.3)
Chloride: 96 mmol/L — ABNORMAL LOW (ref 101–111)
Creatinine, Ser: 0.89 mg/dL (ref 0.44–1.00)
GFR calc non Af Amer: >60 mL/min (ref >60)
Glucose, Bld: 146 mg/dL — ABNORMAL HIGH (ref 65–99)
POTASSIUM: 4.8 mmol/L (ref 3.5–5.1)
SODIUM: 138 mmol/L (ref 135–145)

## 2014-11-19 LAB — GLUCOSE, CAPILLARY: Glucose-Capillary: 126 mg/dL — ABNORMAL HIGH (ref 65–99)

## 2014-11-19 LAB — HEMOGLOBIN A1C
HEMOGLOBIN A1C: 6.1 % — AB (ref 4.8–5.6)
Mean Plasma Glucose: 128 mg/dL

## 2014-11-19 LAB — CBC
HCT: 35.7 % — ABNORMAL LOW (ref 36.0–46.0)
Hemoglobin: 10.1 g/dL — ABNORMAL LOW (ref 12.0–15.0)
MCH: 22.2 pg — AB (ref 26.0–34.0)
MCHC: 28.3 g/dL — ABNORMAL LOW (ref 30.0–36.0)
MCV: 78.6 fL (ref 78.0–100.0)
PLATELETS: 172 10*3/uL (ref 150–400)
RBC: 4.54 MIL/uL (ref 3.87–5.11)
RDW: 19.9 % — AB (ref 11.5–15.5)
WBC: 5.1 10*3/uL (ref 4.0–10.5)

## 2014-11-19 MED ORDER — VITAMIN B-12 1000 MCG PO TABS
1000.0000 ug | ORAL_TABLET | Freq: Every day | ORAL | Status: AC
Start: 2014-11-19 — End: ?

## 2014-11-19 MED ORDER — CEFUROXIME AXETIL 500 MG PO TABS: 500.0000 mg | ORAL_TABLET | Freq: Two times a day (BID) | ORAL | Status: DC

## 2014-11-19 MED ORDER — CEFUROXIME AXETIL 500 MG PO TABS
500.0000 mg | ORAL_TABLET | Freq: Two times a day (BID) | ORAL | Status: DC
  Administered 2014-11-19: 500 mg via ORAL
  Filled 2014-11-19 (×3): qty 1

## 2014-11-19 MED ORDER — CETYLPYRIDINIUM CHLORIDE 0.05 % MT LIQD: 7.0000 mL | Freq: Two times a day (BID) | OROMUCOSAL | Status: DC

## 2014-11-19 MED ORDER — CYANOCOBALAMIN 1000 MCG/ML IJ SOLN
1000.0000 ug | Freq: Once | INTRAMUSCULAR | Status: AC
  Administered 2014-11-19: 1000 ug via INTRAMUSCULAR
  Filled 2014-11-19: qty 1

## 2014-11-19 NOTE — Discharge Summary (Signed)
Physician Discharge Summary  JADELIN Bright ZTI:[id number] DOB: [birth year] DOA: 11/17/2014  PCP: Jillian Morale, MD  Admit date: 11/17/2014 Discharge date: 11/19/2014  Time spent: greater than 30 minutes  Recommendations for Outpatient Follow-up:  1. Monitor h/h 2. Monitor b12  Discharge Diagnoses:  Principal Problem:   Acute on chronic respiratory failure with hypercapnia Active Problems:   Generalized anxiety disorder   Chronic diastolic heart failure   COPD with emphysema   Obesity hypoventilation syndrome   CAD (coronary artery disease)   Rheumatoid arthritis   Syncope   Pressure ulcer   DM type 2 (diabetes mellitus, type 2)   Discharge Condition: stable  Diet recommendation:  Diabetic heart healthy  Filed Weights   11/17/14 2225  Weight: 87.9 kg (193 lb 12.6 oz)    History of present illness:  69 y.o. female with a history of O2 dependent COPD ( 3-4 Liters), CHF, CAD, HTN, Pulmonary HTN, DM2, Rheumatoid Arthritis, and OSA who was found slumped over in her parked car by her neighbors who called EMS. EMS Found her to be hypoxic into the 70's and she was not wearing her NCO2. And she was placed on 4 liters NCO2 and had some improvement in her alertnsss. In the ED, and ABG was performed in the ED and she was found to have CO2 Retention with a pCO2 of 57.7 however per her previous admissions, she has had higher levels of CO2 retention in the past. She remained lethargic but arousable and was placed on BiPAP and referred for admission.   Hospital Course:  Admitted to stepdown. Started on bipap. Mental status improved to baseline.   Found to have uti and started on antibiotics. B12 low. Started on replacement therapy.  By discharge. Back to baseline by discharge  Procedures:  none  Consultations:  none  Discharge Exam: Filed Vitals:   11/19/14 0700  BP:   Pulse:   Temp: 97.7 F (36.5 C)  Resp:     General:a ando Cardiovascular:RRR Respiratory: CTA  without  Discharge Instructions   Discharge Instructions    Diet - low sodium heart healthy    Complete by:  As directed      Diet Carb Modified    Complete by:  As directed      Walker     Complete by:  As directed           Current Discharge Medication List    START taking these medications   Details  cefUROXime (CEFTIN) 500 MG tablet Take 1 tablet (500 mg total) by mouth 2 (two) times daily with a meal. Qty: 7 tablet, Refills: 0    vitamin B-12 (CYANOCOBALAMIN) 1000 MCG tablet Take 1 tablet (1,000 mcg total) by mouth daily. Qty: 30 tablet, Refills: 0      CONTINUE these medications which have NOT CHANGED   Details  albuterol (PROVENTIL HFA;VENTOLIN HFA) 108 (90 BASE) MCG/ACT inhaler Inhale 2 puffs into the lungs every 6 (six) hours as needed for wheezing or shortness of breath.    ALPRAZolam (XANAX) 0.5 MG tablet TAKE 1 TABLET BY MOUTH 3 TIMES A DAY AS NEEDED Qty: 90 tablet, Refills: 5    atorvastatin (LIPITOR) 20 MG tablet Take 1 tablet (20 mg total) by mouth daily. Qty: 90 tablet, Refills: 3    budesonide-formoterol (SYMBICORT) 160-4.5 MCG/ACT inhaler Inhale 2 puffs into the lungs 2 (two) times daily. Qty: 1 Inhaler, Refills: 5    DULoxetine (CYMBALTA) 60 MG capsule Take 1 capsule (60 mg total)  by mouth daily. Qty: 30 capsule, Refills: 11    furosemide (LASIX) 40 MG tablet Take 40 mg by mouth every morning.     gabapentin (NEURONTIN) 300 MG capsule Take 1 capsule (300 mg total) by mouth 3 (three) times daily. Qty: 90 capsule, Refills: 5    glipiZIDE (GLUCOTROL) 10 MG tablet TAKE 1 TABLET (10 MG TOTAL) BY MOUTH 2 (TWO) TIMES DAILY BEFORE A MEAL. Qty: 60 tablet, Refills: 10    glucose blood (ACCU-CHEK AVIVA PLUS) test strip Test once per day and diagnosis code is 250.00 Qty: 100 each, Refills: 3    metFORMIN (GLUCOPHAGE) 500 MG tablet Take 500 mg by mouth 3 (three) times daily.    omeprazole (PRILOSEC) 40 MG capsule Take 1 capsule (40 mg total) by mouth daily  before breakfast. Qty: 90 capsule, Refills: 3    OVER THE COUNTER MEDICATION Place 1-2 drops into both eyes daily as needed (dry eyes.). Soothe.    oxybutynin (DITROPAN-XL) 10 MG 24 hr tablet Take 10 mg by mouth at bedtime.    sitaGLIPtin (JANUVIA) 100 MG tablet Take 1 tablet (100 mg total) by mouth daily. Qty: 90 tablet, Refills: 3    solifenacin (VESICARE) 10 MG tablet Take 1 tablet (10 mg total) by mouth daily. Qty: 90 tablet, Refills: 3    spironolactone (ALDACTONE) 25 MG tablet Take 25 mg by mouth every morning.       STOP taking these medications     ibuprofen (ADVIL,MOTRIN) 800 MG tablet      methocarbamol (ROBAXIN) 500 MG tablet      rivaroxaban (XARELTO) 10 MG TABS tablet        Allergies  Allergen Reactions  . Codeine Itching    Takes vicodin at home   Follow-up Information    Follow up with Jillian Morale, MD.   Specialty:  Family Medicine   Contact information:   [place] [address] [phone]       Follow up with Jillian Mires, MD.   Specialty:  Pulmonary Disease   Contact information:   [address] [place] [address] [phone]        The results of significant diagnostics from this hospitalization (including imaging, microbiology, ancillary and laboratory) are listed below for reference.    Significant Diagnostic Studies: Ct Head Wo Contrast  11/17/2014   CLINICAL DATA:  Syncope with hypoxic episode  EXAM: CT HEAD WITHOUT CONTRAST  TECHNIQUE: Contiguous axial images were obtained from the base of the skull through the vertex without intravenous contrast.  COMPARISON:  February 01, 2012  FINDINGS: The ventricles are normal in size and configuration. There is no intracranial mass, hemorrhage, extra-axial fluid collection, or midline shift. 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 Elsewhere gray-white compartments appear normal. No acute infarct apparent. The bony calvarium  appears intact. The mastoid air cells are clear. There is mucosal thickening in multiple ethmoid air cells bilaterally.  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 Elsewhere gray-white compartments appear normal. No acute infarct. No hemorrhage or mass effect. Mucosal thickening in multiple ethmoid air cells bilaterally.   Electronically Signed   By: Lowella Grip III M.D.   On: 11/17/2014 20:21   Dg Chest Port 1 View  11/17/2014   CLINICAL DATA:  Shortness of breath, syncope  EXAM: PORTABLE CHEST - 1 VIEW  COMPARISON:  07/21/2014  FINDINGS: Cardiomediastinal silhouette is stable. Mild hyperinflation again noted. No acute  infiltrate or pulmonary edema. Stable scarring and mild atelectasis in lingula.  IMPRESSION: No active disease.  No significant change.   Electronically Signed   By: Lahoma Crocker M.D.   On: 11/17/2014 19:11    Microbiology: Recent Results (from the past 240 hour(s))  MRSA PCR Screening     Status: None   Collection Time: 11/17/14 11:00 PM  Result Value Ref Range Status   MRSA by PCR NEGATIVE NEGATIVE Final    Comment:        The GeneXpert MRSA Assay (FDA approved for NASAL specimens only), is one component of a comprehensive MRSA colonization surveillance program. It is not intended to diagnose MRSA infection nor to guide or monitor treatment for MRSA infections.      Labs: Basic Metabolic Panel:  Recent Labs Lab 11/17/14 1829 11/18/14 0339 11/19/14 0316  NA 136 139 138  K 4.6 4.5 4.8  CL 99* 102 96*  CO2 29 32 34*  GLUCOSE 112* 83 146*  BUN '15 11 9  '$ CREATININE 1.05* 0.93 0.89  CALCIUM 8.9 8.6* 9.0   Liver Function Tests:  Recent Labs Lab 11/17/14 1829  AST 17  ALT 11*  ALKPHOS 72  BILITOT 0.7  PROT 6.9  ALBUMIN 3.5    Recent Labs Lab 11/17/14 1829  LIPASE 19*   No results for input(s): AMMONIA in the last 168 hours. CBC:  Recent Labs Lab 11/17/14 1829 11/18/14 0339 11/19/14 0316  WBC 5.7 4.2 5.1   NEUTROABS 3.0  --   --   HGB 9.4* 8.8* 10.1*  HCT 33.7* 31.7* 35.7*  MCV 78.7 80.5 78.6  PLT 182 179 172   Cardiac Enzymes:  Recent Labs Lab 11/17/14 1829  CKTOTAL 22*  TROPONINI <0.03   BNP: BNP (last 3 results)  Recent Labs  11/18/14 1015  BNP 176.5*    ProBNP (last 3 results)  Recent Labs  01/28/14 2215  PROBNP 1016.0*    CBG:  Recent Labs Lab 11/18/14 1122 11/18/14 1739 11/18/14 2135  GLUCAP 189* 148* 162*       Signed:  Sravya Grissom L  [facility] 11/19/2014, 8:11 AM

## 2014-11-19 NOTE — Progress Notes (Signed)
Jillian Bright to be D/C'd Home per MD order.  Discussed with the patient and all questions fully answered.  VSS, Skin clean, dry and intact without evidence of skin break down, no evidence of skin tears noted. IV catheter discontinued intact. Site without signs and symptoms of complications. Dressing and pressure applied.  An After Visit Summary was printed and given to the patient. Patient received prescription.  D/c education completed with patient/family including follow up instructions, medication list, d/c activities limitations if indicated, with other d/c instructions as indicated by MD - patient able to verbalize understanding, all questions fully answered.   Patient instructed to return to ED, call 911, or call MD for any changes in condition.   Patient escorted via [place], and D/C home via taxi with taxi voucher.  [name] [facility] 11/19/2014 12:59 PM

## 2014-11-19 NOTE — Progress Notes (Signed)
Pt for d/c with no family/ friend to help with transportation to home. Pt is on home 02 and is active with [facility] Services.  Jermaine(dme)/[facility]Called to arranged 02 tank for transportation home. 02  Tank to be delivered to pt's room prior to d/c. CSW, Dysheka to provide pt with cab voucher for transportation to home. Whitman Hero RN,BSN,CM [phone]

## 2014-11-19 NOTE — Evaluation (Signed)
Physical Therapy Evaluation Patient Details Name: Jillian Bright MRN: [id number] DOB: May 16, 1946 Today's Date: 11/19/2014   History of Present Illness  Adm after neighbor found her slumped over steering wheel. hypercarbia, hypoxia PMHx- COPD with home O2, DM, CHF, Lt [facility] 3/16  Clinical Impression  Patient evaluated by Physical Therapy with no further acute PT needs identified. All education has been completed and the patient has no further questions. Pt typically uses either cane or RW and agrees to continue using RW until she regains her strength/endurance (limited by lack of recent activity). She demonstrated safe use of RW and has all needed DME. PT is signing off. Thank you for this referral.  Addendum-At rest on 3L SaO2 95%, HR 92; with ambulation on 3L SaO2 slow to read and after one minute rest registered at 97% with HR 102.     Follow Up Recommendations No PT follow up    Equipment Recommendations  None recommended by PT    Recommendations for Other Services       Precautions / Restrictions Precautions Precautions: Fall Restrictions Weight Bearing Restrictions: No      Mobility  Bed Mobility                  Transfers Overall transfer level: Needs assistance Equipment used: Rolling walker (2 wheeled) Transfers: Sit to/from Stand Sit to Stand: Supervision;Modified independent (Device/Increase time)         General transfer comment: first attempt from recliner required vc for safe use of RW; from [facility] pt was able to recall and perform safely  Ambulation/Gait Ambulation/Gait assistance: Supervision;Modified independent (Device/Increase time) Ambulation Distance (Feet): 200 Feet Assistive device: Rolling walker (2 wheeled) Gait Pattern/deviations: WFL(Within Functional Limits)   Gait velocity interpretation: at or above normal speed for age/gender General Gait Details: progressed to modified independent  Stairs            Wheelchair Mobility     Modified Rankin (Stroke Patients Only)       Balance Overall balance assessment: History of Falls ("about one year ago--fell backwards getting up from kitchen )                               Standardized Balance Assessment Standardized Balance Assessment : Berg Balance Test Berg Balance Test Sit to Stand: Able to stand without using hands and stabilize independently Standing Unsupported: Able to stand safely 2 minutes Sitting with Back Unsupported but Feet Supported on Floor or Stool: Able to sit safely and securely 2 minutes Stand to Sit: Sits safely with minimal use of hands Transfers: Able to transfer safely, minor use of hands Standing Unsupported with Eyes Closed: Able to stand 10 seconds with supervision Standing Ubsupported with Feet Together: Able to place feet together independently and stand for 1 minute with supervision From Standing, Reach Forward with Outstretched Arm: Can reach confidently >25 cm (10") From Standing Position, Turn to Look Behind Over each Shoulder: Looks behind from both sides and weight shifts well         Pertinent Vitals/Pain Pain Assessment: No/denies pain    Home Living Family/patient expects to be discharged to:: Private residence Living Arrangements: Alone Available Help at Discharge: Friend(s);Family;Available PRN/intermittently (niece local; brother in [place]) Type of Home: House Home Access: Stairs to enter Entrance Stairs-Rails: Left Entrance Stairs-Number of Steps: 3 Home Layout: One level Home Equipment: Walker - 4 wheels;Tub bench;Cane - single point;Walker - standard  Prior Function Level of Independence: Independent with assistive device(s)         Comments: reports she uses no device to walk inside and either cane or rollator when going out     Hand Dominance   Dominant Hand: Right    Extremity/Trunk Assessment   Upper Extremity Assessment: Overall WFL for tasks assessed           Lower  Extremity Assessment: Overall WFL for tasks assessed      Cervical / Trunk Assessment: Normal  Communication   Communication: No difficulties  Cognition Arousal/Alertness: Awake/alert Behavior During Therapy: WFL for tasks assessed/performed Overall Cognitive Status: No family/caregiver present to determine baseline cognitive functioning ("I don'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 States she knows now she has to use it all the time    Exercises        Assessment/Plan    PT Assessment Patent does not need any further PT services  PT Diagnosis Difficulty walking   PT Problem List    PT Treatment Interventions     PT Goals (Current goals can be found in the Care Plan section) Acute Rehab PT Goals Patient Stated Goal: go home  PT Goal Formulation: All assessment and education complete, DC therapy    Frequency     Barriers to discharge        Co-evaluation               End of Session Equipment Utilized During Treatment: Gait belt;Oxygen Activity Tolerance: Patient tolerated treatment well Patient left: in chair;with call bell/phone within reach Nurse Communication: Mobility status (OK for d/c from PT perspective)         Time: 8453-6468 PT Time Calculation (min) (ACUTE ONLY): 27 min   Charges:   PT Evaluation $Initial PT Evaluation Tier I: 1 Procedure PT Treatments $Gait Training: 8-22 mins   PT G Codes:        Azlynn Mitnick 2014-12-15, 10:58 AM Pager [phone]

## 2014-11-20 LAB — URINE CULTURE: Culture: >=100000

## 2014-11-22 ENCOUNTER — Other Ambulatory Visit: Payer: Self-pay

## 2014-11-24 ENCOUNTER — Inpatient Hospital Stay: Payer: Medicare Other | Admitting: Internal Medicine

## 2014-12-07 ENCOUNTER — Ambulatory Visit (INDEPENDENT_AMBULATORY_CARE_PROVIDER_SITE_OTHER): Payer: Medicare Other | Admitting: Family Medicine

## 2014-12-07 ENCOUNTER — Encounter: Payer: Self-pay | Admitting: Family Medicine

## 2014-12-07 VITALS — BP 137/79 | HR 99 | Ht 66.0 in | Wt 185.0 lb

## 2014-12-07 DIAGNOSIS — J438 Other emphysema: Secondary | ICD-10-CM | POA: Diagnosis not present

## 2014-12-07 DIAGNOSIS — I5032 Chronic diastolic (congestive) heart failure: Secondary | ICD-10-CM

## 2014-12-07 DIAGNOSIS — I272 Pulmonary hypertension, unspecified: Secondary | ICD-10-CM

## 2014-12-07 DIAGNOSIS — I270 Primary pulmonary hypertension: Secondary | ICD-10-CM

## 2014-12-07 DIAGNOSIS — J9622 Acute and chronic respiratory failure with hypercapnia: Secondary | ICD-10-CM

## 2014-12-07 MED ORDER — ATORVASTATIN CALCIUM 20 MG PO TABS: 20.0000 mg | ORAL_TABLET | Freq: Every day | ORAL | Status: AC

## 2014-12-07 MED ORDER — SPIRONOLACTONE 25 MG PO TABS: 25.0000 mg | ORAL_TABLET | Freq: Every morning | ORAL | Status: AC

## 2014-12-07 MED ORDER — METFORMIN HCL 500 MG PO TABS: 500.0000 mg | ORAL_TABLET | Freq: Three times a day (TID) | ORAL | Status: AC

## 2014-12-07 MED ORDER — OMEPRAZOLE 40 MG PO CPDR: 40.0000 mg | DELAYED_RELEASE_CAPSULE | Freq: Every day | ORAL | Status: DC

## 2014-12-07 MED ORDER — GABAPENTIN 300 MG PO CAPS: 300.0000 mg | ORAL_CAPSULE | Freq: Three times a day (TID) | ORAL | Status: DC

## 2014-12-07 MED ORDER — GLUCOSE BLOOD VI STRP: ORAL_STRIP | Status: DC

## 2014-12-07 MED ORDER — FUROSEMIDE 40 MG PO TABS: 40.0000 mg | ORAL_TABLET | Freq: Every morning | ORAL | Status: DC

## 2014-12-07 MED ORDER — TRAZODONE HCL 50 MG PO TABS: 50.0000 mg | ORAL_TABLET | Freq: Every day | ORAL | Status: DC

## 2014-12-07 MED ORDER — DULOXETINE HCL 60 MG PO CPEP: 60.0000 mg | ORAL_CAPSULE | Freq: Every day | ORAL | Status: DC

## 2014-12-07 MED ORDER — OXYBUTYNIN CHLORIDE ER 10 MG PO TB24: 10.0000 mg | ORAL_TABLET | Freq: Every day | ORAL | Status: DC

## 2014-12-07 NOTE — Progress Notes (Signed)
   Subjective:    Patient ID: Jillian Bright, female    DOB: [birth year], 68 y.o.   MRN: [id number]  HPI Here to follow up a hospital stay from 11-17-14 to 11-19-14 for respiratory failure. Ultimately this was found to be the result of her not wearing her oxygen as intended. She has severe COPD with emphysema and should be using oxygen at 3-4 liters continuously. However on the day of admission she had been out shopping without the oxygen and when she drove home she passed out in her driveway sitting in her car. A neighbor saw her and called EMS. Once at the ER no other issues were found except an incidental UTI. Once she was oxygenated she got back to her baseline quickly. Since then she has done well.    Review of Systems  Constitutional: Negative.   Respiratory: Positive for shortness of breath. Negative for chest tightness and wheezing.   Cardiovascular: Negative.   Neurological: Negative.        Objective:   Physical Exam  Constitutional: She is oriented to person, place, and time. She appears well-developed and well-nourished.  Neck: No thyromegaly present.  Cardiovascular: Normal rate, regular rhythm, normal heart sounds and intact distal pulses.   Pulmonary/Chest: Effort normal and breath sounds normal.  Lymphadenopathy:    She has no cervical adenopathy.  Neurological: She is alert and oriented to person, place, and time.          Assessment & Plan:  She is back at baseline with her COPD, pulmonary HTN, and CAD stable. She assures me she will never go without her oxygen again.

## 2014-12-07 NOTE — Addendum Note (Signed)
Addended by: Aggie Hacker A on: 12/07/2014 02:36 PM   Modules accepted: Orders, Medications

## 2014-12-07 NOTE — Progress Notes (Signed)
Pre visit review using our clinic review tool, if applicable. No additional management support is needed unless otherwise documented below in the visit note. 

## 2014-12-13 ENCOUNTER — Other Ambulatory Visit: Payer: Self-pay | Admitting: Family Medicine

## 2014-12-14 ENCOUNTER — Encounter: Payer: Self-pay | Admitting: Adult Health

## 2014-12-14 ENCOUNTER — Ambulatory Visit (INDEPENDENT_AMBULATORY_CARE_PROVIDER_SITE_OTHER): Payer: Medicare Other | Admitting: Adult Health

## 2014-12-14 VITALS — BP 122/68 | HR 105 | Temp 98.2°F | Ht 66.0 in | Wt 185.0 lb

## 2014-12-14 DIAGNOSIS — J9622 Acute and chronic respiratory failure with hypercapnia: Secondary | ICD-10-CM | POA: Diagnosis not present

## 2014-12-14 DIAGNOSIS — J439 Emphysema, unspecified: Secondary | ICD-10-CM

## 2014-12-14 NOTE — Assessment & Plan Note (Signed)
Compensated without flare  Cont on current regimen  

## 2014-12-14 NOTE — Assessment & Plan Note (Signed)
Recent flare with O2 noncompliance and associated UTI Now resolved Cont on o2.   Plan  Continue on current regimen .   Wear oxygen 3l/m daytime , 4l/m at bedtime -at all times.  follow up Dr. Halford Chessman  In 3 months  and As needed   Please contact office for sooner follow up if symptoms do not improve or worsen or seek emergency care

## 2014-12-14 NOTE — Patient Instructions (Signed)
Continue on current regimen .   Wear oxygen 3l/m daytime , 4l/m at bedtime -at all times.  follow up Dr. Halford Chessman  In 3 months  and As needed   Please contact office for sooner follow up if symptoms do not improve or worsen or seek emergency care

## 2014-12-14 NOTE — Progress Notes (Signed)
Subjective:    Patient ID: Jillian Bright, female    DOB: 20-Aug-1945, 69 y.o.   MRN: [id number]  HPI 69 yo female former smoker with COPD, and OSA/OHS unable to tolerate CPAP.  12/14/2014 [facility] follow up /Loie Jahr NP  Patient presents for a post hospital follow-up She was admitted June 22 through June 24 for acute on chronic hypoxemic and hypercarbic respiratory failure. She was found slumped over in her parked car by her neighbors who called EMS. EMS Found her to be hypoxic into the 70's and she was not wearing her O2. ABG was performed in the ED and she was found to have CO2 Retention with a pCO2 of 57.7 however per her previous admissions, she has had higher levels of CO2 retention in the past.She was tx with BIPAP . Pt found to have a UTI , tx w/ abx.  She is starting to feel better. Admits to not wearing her O2 prior to admission . Says she is wearing her o2 now and will keep it on.   Denies chest pain, orthopnea, increased edema or fever.   Remains on Symbicort Twice daily    Tests: PFT 06/13/09>>FEV1 1.97(76%), FEV1% 73, TLC 5.35(101%), DLCO 65% ABG 06/13/09 >>7.44/50.6/55 on room air  PSG 06/19/09 >>AHI 13  PFT 12/25/11>>FEV1 1.17 (52%), FEV1% 58, TLC 3.67 (70%), DLCO 50%, +BD CT chest 12/31/11>>emphysema, 1.5 cm precarinal node CT cervical spine 01/03/12>>11 x 8 mm LUL nodule Echo 02/02/12>>EF 60 to 20%, grade 1 diastolic dysfx, mild AS, mod RV systolic dysfx, PAS 32 mmHg CT chest 06/30/12 >> 1.3 cm precarinal node no change Echo 12/02/12 >> EF 55 to 10%, grade 1 diastolic dysfx, mild/mod AS    Review of Systems Constitutional:   No  weight loss, night sweats,  Fevers, chills,  +fatigue, or  lassitude.  HEENT:   No headaches,  Difficulty swallowing,  Tooth/dental problems, or  Sore throat,                No sneezing, itching, ear ache, nasal congestion, post nasal drip,   CV:  No chest pain,  Orthopnea, PND, swelling in lower extremities, anasarca, dizziness,  palpitations, syncope.   GI  No heartburn, indigestion, abdominal pain, nausea, vomiting, diarrhea, change in bowel habits, loss of appetite, bloody stools.   Resp:  No excess mucus, no productive cough,  No non-productive cough,  No coughing up of blood.  No change in color of mucus.  No wheezing.  No chest wall deformity  Skin: no rash or lesions.  GU: no dysuria, change in color of urine, no urgency or frequency.  No flank pain, no hematuria   MS:  No joint pain or swelling.  No decreased range of motion.  No back pain.  Psych:  No change in mood or affect. No depression or anxiety.  No memory loss.         Objective:   Physical Exam GEN: A/Ox3; pleasant , NAD, elderly   HEENT:  [place]/AT,  EACs-clear, TMs-wnl, NOSE-clear, THROAT-clear, no lesions, no postnasal drip or exudate noted.   NECK:  Supple w/ fair ROM; no JVD; normal carotid impulses w/o bruits; no thyromegaly or nodules palpated; no lymphadenopathy.  RESP  Decreased BS in bases w/o, wheezes/ rales/ or rhonchi.no accessory muscle use, no dullness to percussion  CARD:  RRR, no m/r/g  , tr  peripheral edema, pulses intact, no cyanosis or clubbing.  GI:   Soft & nt; nml bowel sounds; no organomegaly or  masses detected.  Musco: Warm bil, no deformities or joint swelling noted.   Neuro: alert, no focal deficits noted.    Skin: Warm, no lesions or rashes         Assessment & Plan:

## 2014-12-15 NOTE — Progress Notes (Signed)
Reviewed and agree with assessment/plan. 

## 2015-01-24 ENCOUNTER — Telehealth: Payer: Self-pay | Admitting: Family Medicine

## 2015-01-24 NOTE — Telephone Encounter (Signed)
Pt is on Oxybutnin CL ER 10, do you want her on Vesicare '10mg'$  in addition to this or instead of this?

## 2015-01-26 NOTE — Telephone Encounter (Signed)
She is supposed to take the Vesicare in place of the

## 2015-01-26 NOTE — Telephone Encounter (Signed)
I removed Oxybutynin from pt's medication list.

## 2015-01-26 NOTE — Telephone Encounter (Signed)
Please advise 

## 2015-01-26 NOTE — Telephone Encounter (Signed)
The Vesicare is in place of the Oxybutynin

## 2015-01-27 NOTE — Telephone Encounter (Signed)
Pharmacy notified.

## 2015-03-08 ENCOUNTER — Encounter: Payer: Self-pay | Admitting: Pulmonary Disease

## 2015-03-08 ENCOUNTER — Ambulatory Visit (INDEPENDENT_AMBULATORY_CARE_PROVIDER_SITE_OTHER): Payer: Medicare Other | Admitting: Pulmonary Disease

## 2015-03-08 VITALS — BP 138/80 | HR 86 | Temp 98.1°F | Ht 66.0 in | Wt 185.2 lb

## 2015-03-08 DIAGNOSIS — E662 Morbid (severe) obesity with alveolar hypoventilation: Secondary | ICD-10-CM

## 2015-03-08 DIAGNOSIS — Z23 Encounter for immunization: Secondary | ICD-10-CM

## 2015-03-08 DIAGNOSIS — J9611 Chronic respiratory failure with hypoxia: Secondary | ICD-10-CM | POA: Diagnosis not present

## 2015-03-08 DIAGNOSIS — J4489 Other specified chronic obstructive pulmonary disease: Secondary | ICD-10-CM

## 2015-03-08 DIAGNOSIS — J449 Chronic obstructive pulmonary disease, unspecified: Secondary | ICD-10-CM

## 2015-03-08 NOTE — Progress Notes (Signed)
Chief Complaint  Patient presents with  . Follow-up    pt states she is doing pretty good. pt states in the mornings she coughs up some mucous clear in color but at times it is dark in color, alomg with some hoarsness. no c/o SOB or chest tighness. pt using O2 continuous at 3LPM in the daytime and 4LPM at night. DME: [facility]    History of Present Illness: Jillian Bright is a 69 y.o. female former smoker with COPD, and OSA/OHS unable to tolerate CPAP.  She was in hospital in June for COPD and CHF exacerbation.  She is doing better.  She gets cough with clear sputum in the morning.  She is not having wheeze.  She wears her oxygen 24/7.  Tests: PFT 06/13/09 >> FEV1 1.97(76%), FEV1% 73, TLC 5.35(101%), DLCO 65% ABG 06/13/09 >> 7.44/50.6/55 on room air  PSG 06/19/09 >> AHI 13  PFT 12/25/11 >> FEV1 1.17 (52%), FEV1% 58, TLC 3.67 (70%), DLCO 50%, +BD CT chest 12/31/11 >> emphysema, 1.5 cm precarinal node CT cervical spine 01/03/12 >> 11 x 8 mm LUL nodule CT chest 06/30/12 >> 1.3 cm precarinal node no change Echo 01/29/14 >> EF 55 to 16%, grade 1 diastolic dysfx, mod AS  PMHx >> DM, HLD, CAD, Anxiety, GERD, HH  PSHx, Medications, Allergies, Fhx, Shx reviewed.  Physical Exam: BP 138/80 mmHg  Pulse 86  Temp(Src) 98.1 F (36.7 C) (Oral)  Ht '5\' 6"'$  (1.676 m)  Wt 185 lb 3.2 oz (84.006 kg)  BMI 29.91 kg/m2  SpO2 100%  General - No distress ENT - no sinus tenderness, no oral exudate Cardiac - s1s2 regular, 2/6 SM Chest - no wheeze/rales Back - no focal tenderness Abd - soft, non-tender Ext - no edema Neuro - normal strength Skin - no rashes Psych - normal mood, and behavior   Assessment/Plan:  COPD with chronic bronchitis. Plan: - continue sybmicort and prn ventolin - flu shot today - wrote letter for jury duty  Chronic respiratory failure 2nd to COPD and Obesity hypoventilation syndrome. Plan: - continue 3 liters oxygen during day, 4 liters at night with BiPAP   [place]  Pulmonary/Critical Care/Sleep Pager:  [phone] 03/08/2015, 11:57 AM

## 2015-03-08 NOTE — Addendum Note (Signed)
Addended by: Raymondo Band D on: 03/08/2015 12:40 PM   Modules accepted: Orders

## 2015-03-08 NOTE — Patient Instructions (Signed)
Follow up in 6 months 

## 2015-03-17 ENCOUNTER — Telehealth: Payer: Self-pay | Admitting: Pulmonary Disease

## 2015-03-17 DIAGNOSIS — J439 Emphysema, unspecified: Secondary | ICD-10-CM

## 2015-03-17 NOTE — Telephone Encounter (Signed)
lmtcb x1 for pt. 

## 2015-03-18 NOTE — Telephone Encounter (Signed)
Pt says she was tested for Simply Go Mini.  She said that her test indicated that she needed a different machine, she is confused as to what she needs [place] to order. Called Melissa at [facility] to get correct information. Melissa states that she is going to contact her resp team and see what we need to order and call me back. Awaiting Melissa's call

## 2015-03-21 NOTE — Telephone Encounter (Signed)
Awaiting response from [facility]

## 2015-03-21 NOTE — Telephone Encounter (Signed)
[place], CMA           The patient was tested for the Simply Go and the Eclipse. Our RT documented that her liter flow is too high for those and recommended that she get an order for the Simply Go Mini.     ---  Called pt and lmtcb x1

## 2015-03-22 NOTE — Telephone Encounter (Signed)
[phone] calling back

## 2015-03-22 NOTE — Telephone Encounter (Signed)
Okay to order simply go mini.

## 2015-03-22 NOTE — Telephone Encounter (Signed)
Spoke with pt. She is aware that we will be sending in this order. Nothing further was needed at this time.

## 2015-03-22 NOTE — Telephone Encounter (Signed)
lmtcb x1 for pt. 

## 2015-03-22 NOTE — Telephone Encounter (Signed)
Spoke with pt, aware of [facility]'s RT recommendations below.   VS ok to order Simply Go Mini for pt?  Thanks!

## 2015-03-31 ENCOUNTER — Other Ambulatory Visit: Payer: Self-pay | Admitting: Family Medicine

## 2015-04-01 NOTE — Telephone Encounter (Signed)
Call in #[phone] with 5 rf 

## 2015-05-12 ENCOUNTER — Other Ambulatory Visit: Payer: Self-pay | Admitting: Family Medicine

## 2015-05-23 ENCOUNTER — Other Ambulatory Visit: Payer: Self-pay | Admitting: Family Medicine

## 2015-09-07 ENCOUNTER — Emergency Department (HOSPITAL_COMMUNITY): Payer: Medicare Other

## 2015-09-07 ENCOUNTER — Telehealth: Payer: Self-pay | Admitting: Family Medicine

## 2015-09-07 ENCOUNTER — Encounter (HOSPITAL_COMMUNITY): Payer: Self-pay | Admitting: *Deleted

## 2015-09-07 ENCOUNTER — Inpatient Hospital Stay (HOSPITAL_COMMUNITY)
Admission: EM | Admit: 2015-09-07 | Discharge: 2015-09-28 | DRG: 291 | Disposition: A | Discharge status: Skilled Nursing Facility | Payer: Medicare Other | Attending: Internal Medicine | Admitting: Internal Medicine

## 2015-09-07 ENCOUNTER — Ambulatory Visit (INDEPENDENT_AMBULATORY_CARE_PROVIDER_SITE_OTHER)
Admission: EM | Admit: 2015-09-07 | Discharge: 2015-09-07 | Disposition: A | Discharge status: Nursing Home (Includes ICF) | Payer: Medicare Other | Source: Home / Self Care | Attending: Family Medicine | Admitting: Family Medicine

## 2015-09-07 DIAGNOSIS — Z6838 Body mass index (BMI) 38.0-38.9, adult: Secondary | ICD-10-CM

## 2015-09-07 DIAGNOSIS — F419 Anxiety disorder, unspecified: Secondary | ICD-10-CM | POA: Diagnosis present

## 2015-09-07 DIAGNOSIS — J441 Chronic obstructive pulmonary disease with (acute) exacerbation: Secondary | ICD-10-CM | POA: Insufficient documentation

## 2015-09-07 DIAGNOSIS — J9622 Acute and chronic respiratory failure with hypercapnia: Secondary | ICD-10-CM | POA: Diagnosis present

## 2015-09-07 DIAGNOSIS — E662 Morbid (severe) obesity with alveolar hypoventilation: Secondary | ICD-10-CM | POA: Diagnosis not present

## 2015-09-07 DIAGNOSIS — I5032 Chronic diastolic (congestive) heart failure: Secondary | ICD-10-CM | POA: Diagnosis not present

## 2015-09-07 DIAGNOSIS — E1165 Type 2 diabetes mellitus with hyperglycemia: Secondary | ICD-10-CM | POA: Diagnosis present

## 2015-09-07 DIAGNOSIS — E874 Mixed disorder of acid-base balance: Secondary | ICD-10-CM | POA: Diagnosis present

## 2015-09-07 DIAGNOSIS — R1011 Right upper quadrant pain: Secondary | ICD-10-CM

## 2015-09-07 DIAGNOSIS — I272 Other secondary pulmonary hypertension: Secondary | ICD-10-CM | POA: Diagnosis not present

## 2015-09-07 DIAGNOSIS — N182 Chronic kidney disease, stage 2 (mild): Secondary | ICD-10-CM | POA: Diagnosis present

## 2015-09-07 DIAGNOSIS — N181 Chronic kidney disease, stage 1: Secondary | ICD-10-CM

## 2015-09-07 DIAGNOSIS — K219 Gastro-esophageal reflux disease without esophagitis: Secondary | ICD-10-CM | POA: Diagnosis present

## 2015-09-07 DIAGNOSIS — Z66 Do not resuscitate: Secondary | ICD-10-CM | POA: Diagnosis present

## 2015-09-07 DIAGNOSIS — E1100 Type 2 diabetes mellitus with hyperosmolarity without nonketotic hyperglycemic-hyperosmolar coma (NKHHC): Secondary | ICD-10-CM | POA: Diagnosis not present

## 2015-09-07 DIAGNOSIS — Z96642 Presence of left artificial hip joint: Secondary | ICD-10-CM | POA: Diagnosis present

## 2015-09-07 DIAGNOSIS — G9340 Encephalopathy, unspecified: Secondary | ICD-10-CM | POA: Diagnosis not present

## 2015-09-07 DIAGNOSIS — I071 Rheumatic tricuspid insufficiency: Secondary | ICD-10-CM | POA: Diagnosis present

## 2015-09-07 DIAGNOSIS — I110 Hypertensive heart disease with heart failure: Secondary | ICD-10-CM | POA: Diagnosis not present

## 2015-09-07 DIAGNOSIS — I509 Heart failure, unspecified: Secondary | ICD-10-CM | POA: Insufficient documentation

## 2015-09-07 DIAGNOSIS — I5021 Acute systolic (congestive) heart failure: Secondary | ICD-10-CM

## 2015-09-07 DIAGNOSIS — D509 Iron deficiency anemia, unspecified: Secondary | ICD-10-CM | POA: Diagnosis present

## 2015-09-07 DIAGNOSIS — I5023 Acute on chronic systolic (congestive) heart failure: Secondary | ICD-10-CM | POA: Diagnosis not present

## 2015-09-07 DIAGNOSIS — Z9119 Patient's noncompliance with other medical treatment and regimen: Secondary | ICD-10-CM

## 2015-09-07 DIAGNOSIS — E538 Deficiency of other specified B group vitamins: Secondary | ICD-10-CM | POA: Diagnosis present

## 2015-09-07 DIAGNOSIS — J438 Other emphysema: Secondary | ICD-10-CM | POA: Diagnosis not present

## 2015-09-07 DIAGNOSIS — R0902 Hypoxemia: Secondary | ICD-10-CM | POA: Diagnosis not present

## 2015-09-07 DIAGNOSIS — D649 Anemia, unspecified: Secondary | ICD-10-CM | POA: Diagnosis present

## 2015-09-07 DIAGNOSIS — I350 Nonrheumatic aortic (valve) stenosis: Secondary | ICD-10-CM

## 2015-09-07 DIAGNOSIS — J439 Emphysema, unspecified: Secondary | ICD-10-CM | POA: Diagnosis present

## 2015-09-07 DIAGNOSIS — I25119 Atherosclerotic heart disease of native coronary artery with unspecified angina pectoris: Secondary | ICD-10-CM

## 2015-09-07 DIAGNOSIS — R410 Disorientation, unspecified: Secondary | ICD-10-CM | POA: Diagnosis not present

## 2015-09-07 DIAGNOSIS — I50810 Right heart failure, unspecified: Secondary | ICD-10-CM

## 2015-09-07 DIAGNOSIS — E876 Hypokalemia: Secondary | ICD-10-CM | POA: Diagnosis not present

## 2015-09-07 DIAGNOSIS — C3412 Malignant neoplasm of upper lobe, left bronchus or lung: Secondary | ICD-10-CM | POA: Diagnosis present

## 2015-09-07 DIAGNOSIS — E873 Alkalosis: Secondary | ICD-10-CM | POA: Diagnosis not present

## 2015-09-07 DIAGNOSIS — I083 Combined rheumatic disorders of mitral, aortic and tricuspid valves: Secondary | ICD-10-CM | POA: Diagnosis present

## 2015-09-07 DIAGNOSIS — R0781 Pleurodynia: Secondary | ICD-10-CM

## 2015-09-07 DIAGNOSIS — I5033 Acute on chronic diastolic (congestive) heart failure: Secondary | ICD-10-CM | POA: Diagnosis present

## 2015-09-07 DIAGNOSIS — Z7984 Long term (current) use of oral hypoglycemic drugs: Secondary | ICD-10-CM | POA: Diagnosis not present

## 2015-09-07 DIAGNOSIS — Z7951 Long term (current) use of inhaled steroids: Secondary | ICD-10-CM

## 2015-09-07 DIAGNOSIS — J948 Other specified pleural conditions: Secondary | ICD-10-CM | POA: Diagnosis not present

## 2015-09-07 DIAGNOSIS — N179 Acute kidney failure, unspecified: Secondary | ICD-10-CM | POA: Diagnosis present

## 2015-09-07 DIAGNOSIS — R609 Edema, unspecified: Secondary | ICD-10-CM | POA: Diagnosis not present

## 2015-09-07 DIAGNOSIS — R938 Abnormal findings on diagnostic imaging of other specified body structures: Secondary | ICD-10-CM | POA: Diagnosis not present

## 2015-09-07 DIAGNOSIS — R0602 Shortness of breath: Secondary | ICD-10-CM

## 2015-09-07 DIAGNOSIS — I130 Hypertensive heart and chronic kidney disease with heart failure and stage 1 through stage 4 chronic kidney disease, or unspecified chronic kidney disease: Principal | ICD-10-CM | POA: Diagnosis present

## 2015-09-07 DIAGNOSIS — E785 Hyperlipidemia, unspecified: Secondary | ICD-10-CM | POA: Diagnosis present

## 2015-09-07 DIAGNOSIS — I340 Nonrheumatic mitral (valve) insufficiency: Secondary | ICD-10-CM | POA: Diagnosis present

## 2015-09-07 DIAGNOSIS — G4733 Obstructive sleep apnea (adult) (pediatric): Secondary | ICD-10-CM | POA: Diagnosis not present

## 2015-09-07 DIAGNOSIS — I252 Old myocardial infarction: Secondary | ICD-10-CM

## 2015-09-07 DIAGNOSIS — R059 Cough, unspecified: Secondary | ICD-10-CM

## 2015-09-07 DIAGNOSIS — Z9981 Dependence on supplemental oxygen: Secondary | ICD-10-CM | POA: Diagnosis not present

## 2015-09-07 DIAGNOSIS — J449 Chronic obstructive pulmonary disease, unspecified: Secondary | ICD-10-CM | POA: Diagnosis not present

## 2015-09-07 DIAGNOSIS — I10 Essential (primary) hypertension: Secondary | ICD-10-CM | POA: Diagnosis not present

## 2015-09-07 DIAGNOSIS — Z9842 Cataract extraction status, left eye: Secondary | ICD-10-CM

## 2015-09-07 DIAGNOSIS — K5900 Constipation, unspecified: Secondary | ICD-10-CM | POA: Diagnosis present

## 2015-09-07 DIAGNOSIS — J90 Pleural effusion, not elsewhere classified: Secondary | ICD-10-CM | POA: Diagnosis present

## 2015-09-07 DIAGNOSIS — Z87891 Personal history of nicotine dependence: Secondary | ICD-10-CM

## 2015-09-07 DIAGNOSIS — F411 Generalized anxiety disorder: Secondary | ICD-10-CM | POA: Diagnosis not present

## 2015-09-07 DIAGNOSIS — E119 Type 2 diabetes mellitus without complications: Secondary | ICD-10-CM

## 2015-09-07 DIAGNOSIS — E1122 Type 2 diabetes mellitus with diabetic chronic kidney disease: Secondary | ICD-10-CM | POA: Diagnosis present

## 2015-09-07 DIAGNOSIS — J432 Centrilobular emphysema: Secondary | ICD-10-CM | POA: Diagnosis not present

## 2015-09-07 DIAGNOSIS — J9621 Acute and chronic respiratory failure with hypoxia: Secondary | ICD-10-CM | POA: Diagnosis present

## 2015-09-07 DIAGNOSIS — R911 Solitary pulmonary nodule: Secondary | ICD-10-CM | POA: Insufficient documentation

## 2015-09-07 DIAGNOSIS — I2510 Atherosclerotic heart disease of native coronary artery without angina pectoris: Secondary | ICD-10-CM | POA: Diagnosis present

## 2015-09-07 DIAGNOSIS — R9389 Abnormal findings on diagnostic imaging of other specified body structures: Secondary | ICD-10-CM | POA: Diagnosis present

## 2015-09-07 DIAGNOSIS — Z9841 Cataract extraction status, right eye: Secondary | ICD-10-CM

## 2015-09-07 DIAGNOSIS — M797 Fibromyalgia: Secondary | ICD-10-CM | POA: Diagnosis not present

## 2015-09-07 DIAGNOSIS — J9601 Acute respiratory failure with hypoxia: Secondary | ICD-10-CM | POA: Diagnosis not present

## 2015-09-07 DIAGNOSIS — J9811 Atelectasis: Secondary | ICD-10-CM | POA: Diagnosis present

## 2015-09-07 DIAGNOSIS — R05 Cough: Secondary | ICD-10-CM

## 2015-09-07 DIAGNOSIS — J9620 Acute and chronic respiratory failure, unspecified whether with hypoxia or hypercapnia: Secondary | ICD-10-CM | POA: Diagnosis present

## 2015-09-07 HISTORY — DX: Pleural effusion, not elsewhere classified: J90

## 2015-09-07 HISTORY — DX: Rheumatic tricuspid insufficiency: I07.1

## 2015-09-07 HISTORY — DX: Right heart failure, unspecified: I50.810

## 2015-09-07 HISTORY — DX: Nonrheumatic mitral (valve) insufficiency: I34.0

## 2015-09-07 HISTORY — DX: Acute on chronic diastolic (congestive) heart failure: I50.33

## 2015-09-07 HISTORY — DX: Nonrheumatic aortic (valve) stenosis: I35.0

## 2015-09-07 LAB — CBC WITH DIFFERENTIAL/PLATELET
BASOS PCT: 0 %
Basophils Absolute: 0 10*3/uL (ref 0.0–0.1)
Eosinophils Absolute: 0.1 10*3/uL (ref 0.0–0.7)
Eosinophils Relative: 2 %
HEMATOCRIT: 30.4 % — AB (ref 36.0–46.0)
HEMOGLOBIN: 8 g/dL — AB (ref 12.0–15.0)
LYMPHS PCT: 27 %
Lymphs Abs: 1.8 10*3/uL (ref 0.7–4.0)
MCH: 19 pg — AB (ref 26.0–34.0)
MCHC: 26.3 g/dL — AB (ref 30.0–36.0)
MCV: 72.4 fL — ABNORMAL LOW (ref 78.0–100.0)
MONOS PCT: 11 %
Monocytes Absolute: 0.7 10*3/uL (ref 0.1–1.0)
NEUTROS ABS: 4.1 10*3/uL (ref 1.7–7.7)
Neutrophils Relative %: 60 %
Platelets: 194 10*3/uL (ref 150–400)
RBC: 4.2 MIL/uL (ref 3.87–5.11)
RDW: 18.2 % — ABNORMAL HIGH (ref 11.5–15.5)
WBC: 6.7 10*3/uL (ref 4.0–10.5)

## 2015-09-07 LAB — BASIC METABOLIC PANEL
ANION GAP: 13 (ref 5–15)
BUN: 29 mg/dL — AB (ref 6–20)
CO2: 30 mmol/L (ref 22–32)
Calcium: 8.7 mg/dL — ABNORMAL LOW (ref 8.9–10.3)
Chloride: 94 mmol/L — ABNORMAL LOW (ref 101–111)
Creatinine, Ser: 1.41 mg/dL — ABNORMAL HIGH (ref 0.44–1.00)
GFR calc Af Amer: 42 mL/min — ABNORMAL LOW (ref >60)
GFR, EST NON AFRICAN AMERICAN: 37 mL/min — AB (ref >60)
GLUCOSE: 168 mg/dL — AB (ref 65–99)
POTASSIUM: 4.9 mmol/L (ref 3.5–5.1)
Sodium: 137 mmol/L (ref 135–145)

## 2015-09-07 LAB — BRAIN NATRIURETIC PEPTIDE: B Natriuretic Peptide: 848.6 pg/mL — ABNORMAL HIGH (ref 0.0–100.0)

## 2015-09-07 LAB — TROPONIN I: TROPONIN I: 0.04 ng/mL — AB (ref <0.031)

## 2015-09-07 MED ORDER — FUROSEMIDE 10 MG/ML IJ SOLN
40.0000 mg | Freq: Once | INTRAMUSCULAR | Status: AC
  Administered 2015-09-07: 40 mg via INTRAVENOUS
  Filled 2015-09-07: qty 4

## 2015-09-07 MED ORDER — SODIUM CHLORIDE 0.9% FLUSH
INTRAVENOUS | Status: AC
  Filled 2015-09-07: qty 10

## 2015-09-07 MED ORDER — NITROGLYCERIN 2 % TD OINT
1.0000 [in_us] | TOPICAL_OINTMENT | Freq: Once | TRANSDERMAL | Status: AC
  Administered 2015-09-07: 1 [in_us] via TOPICAL
  Filled 2015-09-07: qty 1

## 2015-09-07 MED ORDER — SODIUM CHLORIDE 0.9 % IV BOLUS (SEPSIS): 1000.0000 mL | Freq: Once | INTRAVENOUS | Status: DC

## 2015-09-07 NOTE — ED Provider Notes (Signed)
CSN: [id number]     Arrival date & time 09/07/15  1918 History   First MD Initiated Contact with Patient 09/07/15 1919     Chief Complaint  Patient presents with  . Shortness of Breath     (Consider location/radiation/quality/duration/timing/severity/associated sxs/prior Treatment) HPI Complains of shortness of breath worse with lying flat and improved with sitting upright and worse with walking for the past 2-3 days and increasing bilateral leg edema for the past 3 weeks. Denies cough denies fever. No treatment prior to coming here. No other associated symptoms. Seen at urgent care center sent here for further evaluation. Denies noncompliance with medications Past Medical History  Diagnosis Date  . Diabetes mellitus   . COPD (chronic obstructive pulmonary disease) ([place])     sees Dr. [name]  . Sleep apnea, obstructive   . Pulmonary HTN ([place])   . Obesity (BMI 30.0-34.9)   . Hyperlipidemia   . Myocardial infarction ([place])   . Coronary artery disease   . Anxiety   . Shortness of breath   . GERD (gastroesophageal reflux disease)   . Arthritis     rheumatoid, sees Dr. Tobie Lords  . CHF (congestive heart failure) ([place])   . Pneumonia   . Bronchitis     hx of   . History of hiatal hernia    Past Surgical History  Procedure Laterality Date  . Appendectomy    . Tonsillectomy and adenoidectomy    . Lumbar disc surgery    . Hemorrhoid surgery    . Coronary angioplasty    . Endarterectomy Left 12/04/2012    Procedure: ENDARTERECTOMY CAROTID;  Surgeon: Mal Misty, MD;  Location: [facility];  Service: Vascular;  Laterality: Left;  . Patch angioplasty Left 12/04/2012    Procedure: PATCH ANGIOPLASTY;  Surgeon: Mal Misty, MD;  Location: [facility];  Service: Vascular;  Laterality: Left;  Marland Kitchen Eye surgery      bilat cataract surg   . Colonscopy     . Total hip arthroplasty Left 07/30/2014    Procedure: LEFT TOTAL HIP ARTHROPLASTY ANTERIOR APPROACH;  Surgeon: Meredith Pel, MD;   Location: [facility];  Service: Orthopedics;  Laterality: Left;   Family History  Problem Relation Age of Onset  . Hypertension Mother   . Hyperlipidemia Mother   . Stroke Mother   . Heart attack Mother   . Cancer Mother   . Heart attack Father   . Cancer Brother   . Asthma Maternal Grandfather    Social History  Substance Use Topics  . Smoking status: Former Smoker -- 2.00 packs/day for 36 years    Types: Cigarettes    Quit date: 02/02/2013  . Smokeless tobacco: Never Used     Comment: had restarted and quit again 01/27/12   . Alcohol Use: 0.0 oz/week    0 Standard drinks or equivalent per week     Comment: rare   OB History    No data available     Review of Systems  Constitutional: Negative.   HENT: Negative.   Respiratory: Positive for shortness of breath.   Cardiovascular: Positive for leg swelling.  Gastrointestinal: Negative.   Musculoskeletal: Negative.   Skin: Negative.   Neurological: Negative.   Psychiatric/Behavioral: Negative.   All other systems reviewed and are negative.     Allergies  Codeine  Home Medications   Prior to Admission medications   Medication Sig Start Date End Date Taking? Authorizing Provider  albuterol (PROVENTIL HFA;VENTOLIN HFA) 108 (  90 BASE) MCG/ACT inhaler Inhale 2 puffs into the lungs every 6 (six) hours as needed for wheezing or shortness of breath.    Historical Provider, MD  ALPRAZolam Duanne Moron) 0.5 MG tablet TAKE 1 TABLET BY MOUTH 3 TIMES A DAY AS NEEDED 04/01/15   [name], MD  atorvastatin (LIPITOR) 20 MG tablet Take 1 tablet (20 mg total) by mouth daily. 12/07/14   [name], MD  budesonide-formoterol [facility]) 160-4.5 MCG/ACT inhaler Inhale 2 puffs into the lungs 2 (two) times daily. 10/26/13   Tammy S Parrett, NP  DULoxetine (CYMBALTA) 60 MG capsule Take 1 capsule (60 mg total) by mouth daily. 12/07/14   [name], MD  furosemide (LASIX) 40 MG tablet Take 1 tablet (40 mg total) by mouth every morning. 12/07/14    [name], MD  gabapentin (NEURONTIN) 300 MG capsule TAKE ONE CAPSULE 3 TIMES A DAY 05/24/15   [name], MD  glipiZIDE (GLUCOTROL) 10 MG tablet TAKE 1 TABLET (10 MG TOTAL) BY MOUTH 2 (TWO) TIMES DAILY BEFORE A MEAL. 06/20/14   [name], MD  glucose blood (ACCU-CHEK AVIVA PLUS) test strip Test once per day and diagnosis code is E 11.9 12/07/14   [name], MD  metFORMIN (GLUCOPHAGE) 500 MG tablet Take 1 tablet (500 mg total) by mouth 3 (three) times daily. 12/07/14   [name], MD  omeprazole (PRILOSEC) 40 MG capsule Take 1 capsule (40 mg total) by mouth daily before breakfast. 12/07/14   [name], MD  OVER THE COUNTER MEDICATION Place 1-2 drops into both eyes daily as needed (dry eyes.). Soothe.    Historical Provider, MD  sitaGLIPtin (JANUVIA) 100 MG tablet Take 1 tablet (100 mg total) by mouth daily. 06/04/14   [name], MD  solifenacin (VESICARE) 10 MG tablet Take 1 tablet (10 mg total) by mouth daily. 06/04/14   [name], MD  spironolactone (ALDACTONE) 25 MG tablet Take 1 tablet (25 mg total) by mouth every morning. 12/07/14   [name], MD  traZODone (DESYREL) 50 MG tablet Take 1 tablet (50 mg total) by mouth at bedtime. 12/07/14   [name], MD  vitamin B-12 (CYANOCOBALAMIN) 1000 MCG tablet Take 1 tablet (1,000 mcg total) by mouth daily. 11/19/14   Delfina Redwood, MD   BP 146/92 mmHg  Pulse 105  Temp(Src) 99.3 F (37.4 C) (Oral)  Resp 16  Ht '5\' 8"'$  (1.727 m)  Wt 250 lb 14.4 oz (113.807 kg)  BMI 38.16 kg/m2  SpO2 81% Physical Exam  Constitutional: She appears well-developed and well-nourished.  HENT:  Head: Normocephalic and atraumatic.  Eyes: Conjunctivae are normal. Pupils are equal, round, and reactive to light.  Neck: Neck supple. No JVD present. No tracheal deviation present. No thyromegaly present.  Cardiovascular: Normal rate and regular rhythm.   No murmur heard. Pulmonary/Chest: Effort normal. She has rales.  Rales right base   Abdominal: Soft. Bowel sounds are normal. She exhibits no distension. There is no tenderness.  Musculoskeletal: Normal range of motion. She exhibits edema. She exhibits no tenderness.  3Plus pretibial pitting edema bilaterally  Neurological: She is alert. No cranial nerve deficit. Coordination normal.  Skin: Skin is warm and dry. No rash noted. There is erythema.  Erythematous of her bilateral lower legs  Psychiatric: She has a normal mood and affect.  Nursing note and vitals reviewed.   ED Course  Procedures (including critical care time) Labs Review Labs Reviewed  CBC WITH DIFFERENTIAL/PLATELET - Abnormal; Notable for the following:    Hemoglobin 8.0 (*)    HCT 30.4 (*)    MCV 72.4 (*)    MCH 19.0 (*)    MCHC 26.3 (*)    RDW 18.2 (*)    All other components within normal limits  BASIC METABOLIC PANEL  BRAIN NATRIURETIC PEPTIDE    Imaging Review No results found. I have personally reviewed and evaluated these images and lab results as part of my medical decision-making.   EKG Interpretation   Date/Time:  Wednesday September 07 2015 19:25:13 EDT Ventricular Rate:  105 PR Interval:  155 QRS Duration: 144 QT Interval:  337 QTC Calculation: 445 R Axis:   83 Text Interpretation:  Sinus tachycardia Paired ventricular premature  complexes Aberrant conduction of SV complex(es) Right bundle branch block  No significant change since last tracing Confirmed by Winfred Leeds  MD, Shawntell Dixson  [phone]) on 09/07/2015 9:03:11 PM      Chest xray viewed by me. Results for orders placed or performed during the hospital encounter of 50/53/97  Basic metabolic panel  Result Value Ref Range   Sodium 137 135 - 145 mmol/L   Potassium 4.9 3.5 - 5.1 mmol/L   Chloride 94 (L) 101 - 111 mmol/L   CO2 30 22 - 32 mmol/L   Glucose, Bld 168 (H) 65 - 99 mg/dL   BUN 29 (H) 6 - 20 mg/dL   Creatinine, Ser 1.41 (H) 0.44 - 1.00 mg/dL   Calcium 8.7 (L) 8.9 - 10.3 mg/dL   GFR calc non Af Amer 37 (L) >60 mL/min    GFR calc Af Amer 42 (L) >60 mL/min   Anion gap 13 5 - 15  CBC with Differential/Platelet  Result Value Ref Range   WBC 6.7 4.0 - 10.5 K/uL   RBC 4.20 3.87 - 5.11 MIL/uL   Hemoglobin 8.0 (L) 12.0 - 15.0 g/dL   HCT 30.4 (L) 36.0 - 46.0 %   MCV 72.4 (L) 78.0 - 100.0 fL   MCH 19.0 (L) 26.0 - 34.0 pg   MCHC 26.3 (L) 30.0 - 36.0 g/dL   RDW 18.2 (H) 11.5 - 15.5 %   Platelets 194 150 - 400 K/uL   Neutrophils Relative % 60 %   Lymphocytes Relative 27 %   Monocytes Relative 11 %   Eosinophils Relative 2 %   Basophils Relative 0 %   Neutro Abs 4.1 1.7 - 7.7 K/uL   Lymphs Abs 1.8 0.7 - 4.0 K/uL   Monocytes Absolute 0.7 0.1 - 1.0 K/uL   Eosinophils Absolute 0.1 0.0 - 0.7 K/uL   Basophils Absolute 0.0 0.0 - 0.1 K/uL   RBC Morphology POLYCHROMASIA PRESENT   Brain natriuretic peptide  Result Value Ref Range   B Natriuretic Peptide 848.6 (H) 0.0 - 100.0 pg/mL   Dg Chest 2 View  09/07/2015  CLINICAL DATA:  Shortness of breath with bilateral leg swelling. EXAM: CHEST  2 VIEW COMPARISON:  11/17/2014 FINDINGS: There is cardiomegaly. Small to moderate right pleural effusion with right lower lobe atelectasis. No confluent opacity or effusion on the left. No overt edema. No acute bony abnormality. IMPRESSION: Cardiomegaly. Right lower lobe atelectasis or infiltrate with small to moderate right pleural effusion. Electronically Signed   By: Rolm Baptise M.D.   On: 09/07/2015 21:42    MDM  Favor congestive heart failure over pneumonia, given increased bilateral leg edema and elevated BNP plan intravenous Lasix, topical nitrates. Dr.Doutova consult by me and saw  patient in emergency department and made arrangements for admission to inpatient unit telemetry Final diagnoses:  None   Dx #1 CHF #2 anemia #3hyperglycemia #4 acute kidney injury     Orlie Dakin, MD 09/07/15 2149

## 2015-09-07 NOTE — Telephone Encounter (Signed)
Tommi Rumps - would you mind helping with this since Dr. Sarajane Jews is out of the office?

## 2015-09-07 NOTE — Telephone Encounter (Signed)
Patient Name: Jillian Bright  DOB: [birth year]    Initial Comment Caller states her legs are hurting and swollen. She stumbles around and falls. She was on the phone with Juliann Pulse in the office and was disconnected.    Nurse Assessment  Nurse: Raphael Gibney, RN, Vanita Ingles Date/Time Eilene Ghazi Time): 09/07/2015 12:25:56 PM  Confirm and document reason for call. If symptomatic, describe symptoms. You must click the next button to save text entered. ---Caller states both her legs are swollen and painful. She normally sees Dr.Fry. She is stumbling. She can't get comfortable to sleep. Swelling is above her knees. She has some redness around her ankle. No fever.  Has the patient traveled out of the country within the last 30 days? ---Not Applicable  Does the patient have any new or worsening symptoms? ---Yes  Will a triage be completed? ---Yes  Related visit to physician within the last 2 weeks? ---No  Does the PT have any chronic conditions? (i.e. diabetes, asthma, etc.) ---Yes  List chronic conditions. ---diabetes; MI  Is this a behavioral health or substance abuse call? ---No     Guidelines    Guideline Title Affirmed Question Affirmed Notes  Leg Swelling and Edema SEVERE leg swelling (e.g., swelling extends above knee, entire leg is swollen, weeping fluid)    Final Disposition User   See Physician within 4 Hours (or PCP triage) Raphael Gibney, RN, [place] states she is lightheaded when she goes a short distance. Can not walk very far due to pain. No appts available at [facility]. She does not want to go to another office or go to the urgent care or the ER. Please call pt back.   Referrals  GO TO FACILITY REFUSED   Disagree/Comply: Disagree  Disagree/Comply Reason: Disagree with instructions

## 2015-09-07 NOTE — ED Provider Notes (Signed)
CSN: [id number]     Arrival date & time 09/07/15  1645 History   First MD Initiated Contact with Patient 09/07/15 1749     Chief Complaint  Patient presents with  . Leg Swelling   (Consider location/radiation/quality/duration/timing/severity/associated sxs/prior Treatment) Patient is a 70 y.o. female presenting with shortness of breath. The history is provided by the patient.  Shortness of Breath Severity:  Moderate Onset quality:  Gradual Duration:  3 weeks Progression:  Worsening Chronicity:  New Relieved by:  None tried Worsened by:  Nothing tried Associated symptoms comment:  Edema Risk factors comment:  H/o mi with stents   Past Medical History  Diagnosis Date  . Diabetes mellitus   . COPD (chronic obstructive pulmonary disease) ([place])     sees Dr. [name]  . Sleep apnea, obstructive   . Pulmonary HTN ([place])   . Obesity (BMI 30.0-34.9)   . Hyperlipidemia   . Myocardial infarction ([place])   . Coronary artery disease   . Anxiety   . Shortness of breath   . GERD (gastroesophageal reflux disease)   . Arthritis     rheumatoid, sees Dr. Tobie Lords  . CHF (congestive heart failure) ([place])   . Pneumonia   . Bronchitis     hx of   . History of hiatal hernia    Past Surgical History  Procedure Laterality Date  . Appendectomy    . Tonsillectomy and adenoidectomy    . Lumbar disc surgery    . Hemorrhoid surgery    . Coronary angioplasty    . Endarterectomy Left 12/04/2012    Procedure: ENDARTERECTOMY CAROTID;  Surgeon: Mal Misty, MD;  Location: [facility];  Service: Vascular;  Laterality: Left;  . Patch angioplasty Left 12/04/2012    Procedure: PATCH ANGIOPLASTY;  Surgeon: Mal Misty, MD;  Location: [facility];  Service: Vascular;  Laterality: Left;  Marland Kitchen Eye surgery      bilat cataract surg   . Colonscopy     . Total hip arthroplasty Left 07/30/2014    Procedure: LEFT TOTAL HIP ARTHROPLASTY ANTERIOR APPROACH;  Surgeon: Meredith Pel, MD;  Location: [facility];   Service: Orthopedics;  Laterality: Left;   Family History  Problem Relation Age of Onset  . Hypertension Mother   . Hyperlipidemia Mother   . Stroke Mother   . Heart attack Mother   . Cancer Mother   . Heart attack Father   . Cancer Brother   . Asthma Maternal Grandfather    Social History  Substance Use Topics  . Smoking status: Former Smoker -- 2.00 packs/day for 36 years    Types: Cigarettes    Quit date: 02/02/2013  . Smokeless tobacco: Never Used     Comment: had restarted and quit again 01/27/12   . Alcohol Use: 0.0 oz/week    0 Standard drinks or equivalent per week     Comment: rare   OB History    No data available     Review of Systems  Constitutional: Negative.   HENT: Negative.   Respiratory: Positive for shortness of breath.   Cardiovascular: Positive for leg swelling.  All other systems reviewed and are negative.   Allergies  Codeine  Home Medications   Prior to Admission medications   Medication Sig Start Date End Date Taking? Authorizing Provider  albuterol (PROVENTIL HFA;VENTOLIN HFA) 108 (90 BASE) MCG/ACT inhaler Inhale 2 puffs into the lungs every 6 (six) hours as needed for wheezing or shortness of breath.  Historical Provider, MD  ALPRAZolam Duanne Moron) 0.5 MG tablet TAKE 1 TABLET BY MOUTH 3 TIMES A DAY AS NEEDED 04/01/15   [name], MD  atorvastatin (LIPITOR) 20 MG tablet Take 1 tablet (20 mg total) by mouth daily. 12/07/14   [name], MD  budesonide-formoterol [facility]) 160-4.5 MCG/ACT inhaler Inhale 2 puffs into the lungs 2 (two) times daily. 10/26/13   Tammy S Parrett, NP  DULoxetine (CYMBALTA) 60 MG capsule Take 1 capsule (60 mg total) by mouth daily. 12/07/14   [name], MD  furosemide (LASIX) 40 MG tablet Take 1 tablet (40 mg total) by mouth every morning. 12/07/14   [name], MD  gabapentin (NEURONTIN) 300 MG capsule TAKE ONE CAPSULE 3 TIMES A DAY 05/24/15   [name], MD  glipiZIDE (GLUCOTROL) 10 MG tablet TAKE 1 TABLET  (10 MG TOTAL) BY MOUTH 2 (TWO) TIMES DAILY BEFORE A MEAL. 06/20/14   [name], MD  glucose blood (ACCU-CHEK AVIVA PLUS) test strip Test once per day and diagnosis code is E 11.9 12/07/14   [name], MD  metFORMIN (GLUCOPHAGE) 500 MG tablet Take 1 tablet (500 mg total) by mouth 3 (three) times daily. 12/07/14   [name], MD  omeprazole (PRILOSEC) 40 MG capsule Take 1 capsule (40 mg total) by mouth daily before breakfast. 12/07/14   [name], MD  OVER THE COUNTER MEDICATION Place 1-2 drops into both eyes daily as needed (dry eyes.). Soothe.    Historical Provider, MD  sitaGLIPtin (JANUVIA) 100 MG tablet Take 1 tablet (100 mg total) by mouth daily. 06/04/14   [name], MD  solifenacin (VESICARE) 10 MG tablet Take 1 tablet (10 mg total) by mouth daily. 06/04/14   [name], MD  spironolactone (ALDACTONE) 25 MG tablet Take 1 tablet (25 mg total) by mouth every morning. 12/07/14   [name], MD  traZODone (DESYREL) 50 MG tablet Take 1 tablet (50 mg total) by mouth at bedtime. 12/07/14   [name], MD  vitamin B-12 (CYANOCOBALAMIN) 1000 MCG tablet Take 1 tablet (1,000 mcg total) by mouth daily. 11/19/14   Delfina Redwood, MD   Meds Ordered and Administered this Visit  Medications - No data to display  BP 97/64 mmHg  Pulse 109  Temp(Src) 98.3 F (36.8 C) (Oral)  Resp 16  SpO2 71% No data found.   Physical Exam  Constitutional: She is oriented to person, place, and time. She appears well-developed and well-nourished. She appears distressed.  Cardiovascular: Regular rhythm, intact distal pulses and normal pulses.  Tachycardia present.  Exam reveals gallop. Exam reveals no friction rub.   Murmur heard.  Systolic murmur is present with a grade of 2/6  Pulmonary/Chest: She has rales.  Musculoskeletal: She exhibits edema.  Neurological: She is alert and oriented to person, place, and time.  Skin: Skin is warm and dry.  Nursing note and vitals reviewed.   ED Course   Procedures (including critical care time)  Labs Review Labs Reviewed - No data to display  Imaging Review No results found.   Visual Acuity Review  Right Eye Distance:   Left Eye Distance:   Bilateral Distance:    Right Eye Near:   Left Eye Near:    Bilateral Near:         MDM   1. Acute systolic congestive heart failure (HCC)    4+ tender worsening pitting edema bilat with hypoxia and tachycardia c/w  Narda Bonds, MD 09/07/15 [phone]

## 2015-09-07 NOTE — ED Notes (Signed)
20  Angio   l  Hand  1  Att  Nasal o2  At  2 l /  Min      Cardiac  Monitor

## 2015-09-07 NOTE — ED Notes (Signed)
Admitting at bedside 

## 2015-09-07 NOTE — ED Notes (Signed)
MD at bedside. 

## 2015-09-07 NOTE — ED Notes (Signed)
Patient transported to X-ray 

## 2015-09-07 NOTE — H&P (Signed)
PCP: [name], MD  Pulmonology [facility]   Referring provider [place]   Chief Complaint:  Shortness of breath  HPI: Jillian Bright is a 70 y.o. female   has a past medical history of Diabetes mellitus; COPD (chronic obstructive pulmonary disease) ([place]); Sleep apnea, obstructive; Pulmonary HTN ([place]); Obesity (BMI 30.0-34.9); Hyperlipidemia; Myocardial infarction ([place]); Coronary artery disease; Anxiety; Shortness of breath; GERD (gastroesophageal reflux disease); Arthritis; CHF (congestive heart failure) ([place]); Pneumonia; Bronchitis; and History of hiatal hernia.   Presented with 3 wk hx of progressive Legs swelling  and pain both are swollen but left is worse than right.  She has had some some shortness of breath and orthopnea no chest pain, no fever. No cough. The onset was gradual. She used to be on a diuretic but stopped taking it a long time ago due to having to go to the bathroom too often and nocturnal accidents.  Denies melena, no blood in stool no vaginal bleeding.   IN ER:  Hg 8.0, Cr 1.4 Hypoxic down to 86% improved after being back on her 40 L of oxygen which is her baseline CXR showing cardiomegaly with right lower lobe atelectasis vs infiltrate.  BNP 848  Regarding pertinent past history: Patient is known history of COPD O3 liters at baseline as well as history of diastolic heart failure last echogram was in 2015 showing EF of 22%-97 [place] 1 diastolic heart failure and moderate aortic stenosis. Patient also has diabetes well controlled  Hospitalist was called for admission for CHF exacerbation  Review of Systems:    Pertinent positives include: shortness of breath at rest. Bilateral lower extremity swelling  Weight gain Constitutional:  No weight loss, night sweats, Fevers, chills, fatigue, weight loss  HEENT:  No headaches, Difficulty swallowing,Tooth/dental problems,Sore throat,  No sneezing, itching, ear ache, nasal congestion, post nasal  drip,  Cardio-vascular:  No chest pain, Orthopnea, PND, anasarca, dizziness, palpitations.no  GI:  No heartburn, indigestion, abdominal pain, nausea, vomiting, diarrhea, change in bowel habits, loss of appetite, melena, blood in stool, hematemesis Resp:  no No dyspnea on exertion, No excess mucus, no productive cough, No non-productive cough, No coughing up of blood.No change in color of mucus.No wheezing. Skin:  no rash or lesions. No jaundice GU:  no dysuria, change in color of urine, no urgency or frequency. No straining to urinate.  No flank pain.  Musculoskeletal:  No joint pain or no joint swelling. No decreased range of motion. No back pain.  Psych:  No change in mood or affect. No depression or anxiety. No memory loss.  Neuro: no localizing neurological complaints, no tingling, no weakness, no double vision, no gait abnormality, no slurred speech, no confusion  Otherwise ROS are negative except for above, 10 systems were reviewed  Past Medical History: Past Medical History  Diagnosis Date  . Diabetes mellitus   . COPD (chronic obstructive pulmonary disease) ([place])     sees Dr. [name]  . Sleep apnea, obstructive   . Pulmonary HTN ([place])   . Obesity (BMI 30.0-34.9)   . Hyperlipidemia   . Myocardial infarction ([place])   . Coronary artery disease   . Anxiety   . Shortness of breath   . GERD (gastroesophageal reflux disease)   . Arthritis     rheumatoid, sees Dr. Tobie Lords  . CHF (congestive heart failure) ([place])   . Pneumonia   . Bronchitis     hx of   . History of hiatal hernia  Past Surgical History  Procedure Laterality Date  . Appendectomy    . Tonsillectomy and adenoidectomy    . Lumbar disc surgery    . Hemorrhoid surgery    . Coronary angioplasty    . Endarterectomy Left 12/04/2012    Procedure: ENDARTERECTOMY CAROTID;  Surgeon: Mal Misty, MD;  Location: [place];  Service: Vascular;  Laterality: Left;  . Patch angioplasty Left 12/04/2012     Procedure: PATCH ANGIOPLASTY;  Surgeon: Mal Misty, MD;  Location: [place];  Service: Vascular;  Laterality: Left;  Marland Kitchen Eye surgery      bilat cataract surg   . Colonscopy     . Total hip arthroplasty Left 07/30/2014    Procedure: LEFT TOTAL HIP ARTHROPLASTY ANTERIOR APPROACH;  Surgeon: Meredith Pel, MD;  [facility];  Service: Orthopedics;  Laterality: Left;     Medications: Prior to Admission medications   Medication Sig Start Date End Date Taking? Authorizing Provider  albuterol (PROVENTIL HFA;VENTOLIN HFA) 108 (90 BASE) MCG/ACT inhaler Inhale 2 puffs into the lungs every 6 (six) hours as needed for wheezing or shortness of breath.   Yes Historical Provider, MD  ALPRAZolam (XANAX) 0.5 MG tablet TAKE 1 TABLET BY MOUTH 3 TIMES A DAY AS NEEDED Patient taking differently: TAKE 1 TABLET BY MOUTH 3 TIMES A DAY AS NEEDED anxiety 04/01/15  Yes [name], MD  atorvastatin (LIPITOR) 20 MG tablet Take 1 tablet (20 mg total) by mouth daily. 12/07/14  Yes [name], MD  budesonide-formoterol [facility]) 160-4.5 MCG/ACT inhaler Inhale 2 puffs into the lungs 2 (two) times daily. 10/26/13  Yes Tammy S Parrett, NP  DULoxetine (CYMBALTA) 60 MG capsule Take 1 capsule (60 mg total) by mouth daily. 12/07/14  Yes [name], MD  gabapentin (NEURONTIN) 300 MG capsule TAKE ONE CAPSULE 3 TIMES A DAY 05/24/15  Yes [name], MD  glipiZIDE (GLUCOTROL) 10 MG tablet TAKE 1 TABLET (10 MG TOTAL) BY MOUTH 2 (TWO) TIMES DAILY BEFORE A MEAL. 06/20/14  Yes [name], MD  metFORMIN (GLUCOPHAGE) 500 MG tablet Take 1 tablet (500 mg total) by mouth 3 (three) times daily. 12/07/14  Yes [name], MD  omeprazole (PRILOSEC) 40 MG capsule Take 1 capsule (40 mg total) by mouth daily before breakfast. 12/07/14  Yes [name], MD  OVER THE COUNTER MEDICATION Place 1-2 drops into both eyes daily as needed (dry eyes.). Soothe.   Yes Historical Provider, MD  sitaGLIPtin (JANUVIA) 100 MG tablet Take 1 tablet  (100 mg total) by mouth daily. 06/04/14  Yes [name], MD  solifenacin (VESICARE) 10 MG tablet Take 1 tablet (10 mg total) by mouth daily. 06/04/14  Yes [name], MD  spironolactone (ALDACTONE) 25 MG tablet Take 1 tablet (25 mg total) by mouth every morning. 12/07/14  Yes [name], MD  traZODone (DESYREL) 50 MG tablet Take 1 tablet (50 mg total) by mouth at bedtime. 12/07/14  Yes [name], MD  vitamin B-12 (CYANOCOBALAMIN) 1000 MCG tablet Take 1 tablet (1,000 mcg total) by mouth daily. 11/19/14  Yes Delfina Redwood, MD    Allergies:   Allergies  Allergen Reactions  . Codeine Itching    Takes vicodin at home    Social History:  Ambulatory  walker or cane  Lives at home alone,       reports that she quit smoking about 2 years ago. Her smoking use included Cigarettes. She has a  72 pack-year smoking history. She has never used smokeless tobacco. She reports that she drinks alcohol. She reports that she does not use illicit drugs.     Family History: family history includes Asthma in her maternal grandfather; Cancer in her brother and mother; Heart attack in her father and mother; Hyperlipidemia in her mother; Hypertension in her mother; Stroke in her mother.    Physical Exam: Patient Vitals for the past 24 hrs:  BP Temp Temp src Pulse Resp SpO2 Height Weight  09/07/15 2100 139/94 mmHg - - 65 15 (!) 86 % - -  09/07/15 2012 - - - - - (!) 81 % - -  09/07/15 2000 146/92 mmHg - - 105 16 97 % - -  09/07/15 1945 126/83 mmHg - - 106 14 95 % - -  09/07/15 1927 126/86 mmHg 99.3 F (37.4 C) Oral 104 20 99 % '5\' 8"'$  (1.727 m) 113.807 kg (250 lb 14.4 oz)    1. General:  in No Acute distress 2. Psychological: Alert and   Oriented 3. Head/ENT:   Moist   Mucous Membranes                          Head Non traumatic, neck supple                          Normal  Dentition 4. SKIN: normal   Skin turgor,  Skin clean Dry and intact no rash 5. Heart: Regular rate and rhythm systolic  Murmur, Rub or gallop 6. Lungs:  no wheezes Occasional mild crackles  at the bases 7. Abdomen: Soft, non-tender, Non distended 8. Lower extremities: no clubbing, cyanosis, 2+edema 9. Neurologically Grossly intact, moving all 4 extremities equally 10. MSK: Normal range of motion  body mass index is 38.16 kg/(m^2).   Labs on Admission:   Results for orders placed or performed during the hospital encounter of 09/07/15 (from the past 24 hour(s))  Basic metabolic panel     Status: Abnormal   Collection Time: 09/07/15  8:00 PM  Result Value Ref Range   Sodium 137 135 - 145 mmol/L   Potassium 4.9 3.5 - 5.1 mmol/L   Chloride 94 (L) 101 - 111 mmol/L   CO2 30 22 - 32 mmol/L   Glucose, Bld 168 (H) 65 - 99 mg/dL   BUN 29 (H) 6 - 20 mg/dL   Creatinine, Ser 1.41 (H) 0.44 - 1.00 mg/dL   Calcium 8.7 (L) 8.9 - 10.3 mg/dL   GFR calc non Af Amer 37 (L) >60 mL/min   GFR calc Af Amer 42 (L) >60 mL/min   Anion gap 13 5 - 15  CBC with Differential/Platelet     Status: Abnormal   Collection Time: 09/07/15  8:00 PM  Result Value Ref Range   WBC 6.7 4.0 - 10.5 K/uL   RBC 4.20 3.87 - 5.11 MIL/uL   Hemoglobin 8.0 (L) 12.0 - 15.0 g/dL   HCT 30.4 (L) 36.0 - 46.0 %   MCV 72.4 (L) 78.0 - 100.0 fL   MCH 19.0 (L) 26.0 - 34.0 pg   MCHC 26.3 (L) 30.0 - 36.0 g/dL   RDW 18.2 (H) 11.5 - 15.5 %   Platelets 194 150 - 400 K/uL   Neutrophils Relative % 60 %   Lymphocytes Relative 27 %   Monocytes Relative 11 %   Eosinophils Relative 2 %   Basophils Relative 0 %  Neutro Abs 4.1 1.7 - 7.7 K/uL   Lymphs Abs 1.8 0.7 - 4.0 K/uL   Monocytes Absolute 0.7 0.1 - 1.0 K/uL   Eosinophils Absolute 0.1 0.0 - 0.7 K/uL   Basophils Absolute 0.0 0.0 - 0.1 K/uL   RBC Morphology POLYCHROMASIA PRESENT   Brain natriuretic peptide     Status: Abnormal   Collection Time: 09/07/15  8:00 PM  Result Value Ref Range   B Natriuretic Peptide 848.6 (H) 0.0 - 100.0 pg/mL    UA ordered  Lab Results  Component Value Date   HGBA1C  6.1* 11/17/2014    Estimated Creatinine Clearance: 48.5 mL/min (by C-G formula based on Cr of 1.41).  BNP (last 3 results) No results for input(s): PROBNP in the last 8760 hours.  Other results:  I have pearsonaly reviewed this: ECG REPORT  Rate: 105  Rhythm: Sinus tachycardia with right bundle branch block ST&T Change: No evidence of acute ischemic changes diffuse T-wave inversions QTC 445  Filed Weights   09/07/15 1927  Weight: 113.807 kg (250 lb 14.4 oz)     Cultures:    Component Value Date/Time   SDES URINE, CLEAN CATCH 11/18/2014 0750   [place] 161096 1027 11/18/2014 0750   CULT >=100,000 COLONIES/mL ESCHERICHIA COLI 11/18/2014 0750   REPTSTATUS 11/20/2014 FINAL 11/18/2014 0750     Radiological Exams on Admission: Dg Chest 2 View  09/07/2015  CLINICAL DATA:  Shortness of breath with bilateral leg swelling. EXAM: CHEST  2 VIEW COMPARISON:  11/17/2014 FINDINGS: There is cardiomegaly. Small to moderate right pleural effusion with right lower lobe atelectasis. No confluent opacity or effusion on the left. No overt edema. No acute bony abnormality. IMPRESSION: Cardiomegaly. Right lower lobe atelectasis or infiltrate with small to moderate right pleural effusion. Electronically Signed   By: Rolm Baptise M.D.   On: 09/07/2015 21:42    Chart has been reviewed  Family not  at  Bedside    Assessment/Plan  71 year old female with history of COPD on 3 L of oxygen at home history of coronary artery disease and diastolic heart failure presents with worsening shortness of breath and lower extremity edema in the setting of noncompliance with diuretic  Present on Admission:  . Acute on chronic diastolic CHF (congestive heart failure), NYHA class 1 (HCC) - also likely secondary to noncompliance  - admit on telemetry, cycle cardiac enzymes, obtain serial ECG, to evaluate for ischemia as a cause of heart failure  monitor daily weight  diurese with IV lasix and monitor  orthostatics and creatinine to avoid over diuresis.  Order echogram to evaluate EF and valves Hold off on ACE/ARBi due to renal insufficiency  cardiology consult if no improvement with diuresis   . CAD (coronary artery disease) chronic continue home medications and cycle cardiac enzymes  . Essential hypertension chronic continue home medications   . Microcytic anemia possibly slow GI blood loss was obtain anemia panel Hemoccult stool type and screen transfuse for hemoglobin below 7 or if significantly symptomatic . COPD with emphysema ([place]) stable continue home oxygen and inhalers Abnormal chest x-ray patient denies any cough or fever and no white blood cell count suspect moreover scarring or atelectasis. Repeat chest x-ray after patient has been diuresed to see if theres improvement. Diabetes mellitus hold by mouth medications order sliding scale Leg edema suspect likely secondary to heart failure but given  concern of blood clot and risk factors including obesity Dopplers to rule out DVT Prophylaxis: SCD    CODE  STATUS:   DNR/DNI as per patient    Disposition:  To home once workup is complete and patient is stable  Other plan as per orders.  I have spent a total of 57 min on this admission    Json Koelzer 09/07/2015, 10:41 PM    [facility]  Pager [phone]   after 2 AM please page floor coverage PA If 7AM-7PM, please contact the day team taking care of the patient  [email or web address]  Password [email or web address]

## 2015-09-07 NOTE — ED Provider Notes (Signed)
CSN: [id number]     Arrival date & time 09/07/15  1918 History   First MD Initiated Contact with Patient 09/07/15 1919     Chief Complaint  Patient presents with  . Shortness of Breath     (Consider location/radiation/quality/duration/timing/severity/associated sxs/prior Treatment) HPI  Past Medical History  Diagnosis Date  . Diabetes mellitus   . COPD (chronic obstructive pulmonary disease) ([place])     sees Dr. [name]  . Sleep apnea, obstructive   . Pulmonary HTN ([place])   . Obesity (BMI 30.0-34.9)   . Hyperlipidemia   . Myocardial infarction ([place])   . Coronary artery disease   . Anxiety   . Shortness of breath   . GERD (gastroesophageal reflux disease)   . Arthritis     rheumatoid, sees Dr. Tobie Lords  . CHF (congestive heart failure) ([place])   . Pneumonia   . Bronchitis     hx of   . History of hiatal hernia    Past Surgical History  Procedure Laterality Date  . Appendectomy    . Tonsillectomy and adenoidectomy    . Lumbar disc surgery    . Hemorrhoid surgery    . Coronary angioplasty    . Endarterectomy Left 12/04/2012    Procedure: ENDARTERECTOMY CAROTID;  Surgeon: Mal Misty, MD;  Location: [place];  Service: Vascular;  Laterality: Left;  . Patch angioplasty Left 12/04/2012    Procedure: PATCH ANGIOPLASTY;  Surgeon: Mal Misty, MD;  Location: [place];  Service: Vascular;  Laterality: Left;  Marland Kitchen Eye surgery      bilat cataract surg   . Colonscopy     . Total hip arthroplasty Left 07/30/2014    Procedure: LEFT TOTAL HIP ARTHROPLASTY ANTERIOR APPROACH;  Surgeon: Meredith Pel, MD;  Location[facility];  Service: Orthopedics;  Laterality: Left;   Family History  Problem Relation Age of Onset  . Hypertension Mother   . Hyperlipidemia Mother   . Stroke Mother   . Heart attack Mother   . Cancer Mother   . Heart attack Father   . Cancer Brother   . Asthma Maternal Grandfather    Social History  Substance Use Topics  . Smoking status: Former Smoker --  2.00 packs/day for 36 years    Types: Cigarettes    Quit date: 02/02/2013  . Smokeless tobacco: Never Used     Comment: had restarted and quit again 01/27/12   . Alcohol Use: 0.0 oz/week    0 Standard drinks or equivalent per week     Comment: rare   OB History    No data available     Review of Systems    Allergies  Codeine  Home Medications   Prior to Admission medications   Medication Sig Start Date End Date Taking? Authorizing Provider  albuterol (PROVENTIL HFA;VENTOLIN HFA) 108 (90 BASE) MCG/ACT inhaler Inhale 2 puffs into the lungs every 6 (six) hours as needed for wheezing or shortness of breath.    Historical Provider, MD  ALPRAZolam Duanne Moron) 0.5 MG tablet TAKE 1 TABLET BY MOUTH 3 TIMES A DAY AS NEEDED 04/01/15   [name], MD  atorvastatin (LIPITOR) 20 MG tablet Take 1 tablet (20 mg total) by mouth daily. 12/07/14   [name], MD  budesonide-formoterol [facility]) 160-4.5 MCG/ACT inhaler Inhale 2 puffs into the lungs 2 (two) times daily. 10/26/13   Tammy S Parrett, NP  DULoxetine (CYMBALTA) 60 MG capsule Take 1 capsule (60 mg total) by mouth  daily. 12/07/14   [name], MD  furosemide (LASIX) 40 MG tablet Take 1 tablet (40 mg total) by mouth every morning. 12/07/14   [name], MD  gabapentin (NEURONTIN) 300 MG capsule TAKE ONE CAPSULE 3 TIMES A DAY 05/24/15   [name], MD  glipiZIDE (GLUCOTROL) 10 MG tablet TAKE 1 TABLET (10 MG TOTAL) BY MOUTH 2 (TWO) TIMES DAILY BEFORE A MEAL. 06/20/14   [name], MD  glucose blood (ACCU-CHEK AVIVA PLUS) test strip Test once per day and diagnosis code is E 11.9 12/07/14   [name], MD  metFORMIN (GLUCOPHAGE) 500 MG tablet Take 1 tablet (500 mg total) by mouth 3 (three) times daily. 12/07/14   [name], MD  omeprazole (PRILOSEC) 40 MG capsule Take 1 capsule (40 mg total) by mouth daily before breakfast. 12/07/14   [name], MD  OVER THE COUNTER MEDICATION Place 1-2 drops into both eyes daily as needed (dry  eyes.). Soothe.    Historical Provider, MD  sitaGLIPtin (JANUVIA) 100 MG tablet Take 1 tablet (100 mg total) by mouth daily. 06/04/14   [name], MD  solifenacin (VESICARE) 10 MG tablet Take 1 tablet (10 mg total) by mouth daily. 06/04/14   [name], MD  spironolactone (ALDACTONE) 25 MG tablet Take 1 tablet (25 mg total) by mouth every morning. 12/07/14   [name], MD  traZODone (DESYREL) 50 MG tablet Take 1 tablet (50 mg total) by mouth at bedtime. 12/07/14   [name], MD  vitamin B-12 (CYANOCOBALAMIN) 1000 MCG tablet Take 1 tablet (1,000 mcg total) by mouth daily. 11/19/14   Delfina Redwood, MD   BP 126/83 mmHg  Pulse 106  Temp(Src) 99.3 F (37.4 C) (Oral)  Resp 14  Ht '5\' 8"'$  (1.727 m)  Wt 250 lb 14.4 oz (113.807 kg)  BMI 38.16 kg/m2  SpO2 95% Physical Exam  ED Course  Procedures (including critical care time) Labs Review Labs Reviewed - No data to display  Imaging Review No results found. I have personally reviewed and evaluated these images and lab results as part of my medical decision-making.   EKG Interpretation None      MDM   Final diagnoses:  None    Please delete this note. This is a duplicate note    Orlie Dakin, MD 09/08/15 0140

## 2015-09-07 NOTE — ED Notes (Signed)
RN made 2nd attempt to call report to floor;RN to call back

## 2015-09-07 NOTE — Progress Notes (Signed)
Patient transferred from the ED via stretcher to room 3E16. Oriented patient to room and room equipment and educated patient to the heart failure floor. Verified placement of leads and telemetry box with and second verification of telemetry with CMT. Will continue to monitor patient to end of shift.

## 2015-09-07 NOTE — ED Notes (Signed)
Pt    Reports         Symptoms      Of      Shortness  Of  Breath           And  Swelling    Of        Extremities          She  Reports       Symptoms   X  sev  Weeks     Pt  Gets   Shortness of  Breath   On  Exertion        she  Takes  Oxygen at  Home       She  Drove  Herself  To     The     Urgent clinic

## 2015-09-07 NOTE — Telephone Encounter (Signed)
The pt called back and I informed her per Tommi Rumps she should go to an urgent care.  She stated she lives near [address] and I told her [facility] is on [address] and advised her they close at 8pm but to try to go as soon as she can and she stated she will go there now.  She asked that I send a list of her medications and I advised her they will have access to this.

## 2015-09-07 NOTE — Telephone Encounter (Signed)
I asked Jillian Bright to review the message and he stated since the pt does not want to go to the ER, the pt should go to an urgent care to be sure she does not have a clot.  I left a message at her cell number to return my call and have me paged-voicemail at her home number is full.

## 2015-09-07 NOTE — ED Notes (Signed)
Per [facility] pt arrives from [facility] c/o dyspnea on exertion x 3 days. Bilateral leg edema +2 pitting.

## 2015-09-07 NOTE — ED Notes (Signed)
RN attempted to call report to floor; RN to call back

## 2015-09-08 ENCOUNTER — Other Ambulatory Visit (HOSPITAL_COMMUNITY): Payer: Medicare Other

## 2015-09-08 ENCOUNTER — Inpatient Hospital Stay (HOSPITAL_COMMUNITY): Payer: Medicare Other

## 2015-09-08 ENCOUNTER — Encounter (HOSPITAL_COMMUNITY): Payer: Self-pay | Admitting: General Practice

## 2015-09-08 DIAGNOSIS — R609 Edema, unspecified: Secondary | ICD-10-CM

## 2015-09-08 DIAGNOSIS — I509 Heart failure, unspecified: Secondary | ICD-10-CM | POA: Diagnosis present

## 2015-09-08 DIAGNOSIS — R9389 Abnormal findings on diagnostic imaging of other specified body structures: Secondary | ICD-10-CM | POA: Diagnosis present

## 2015-09-08 LAB — BASIC METABOLIC PANEL
Anion gap: 10 (ref 5–15)
BUN: 29 mg/dL — ABNORMAL HIGH (ref 6–20)
CALCIUM: 8.4 mg/dL — AB (ref 8.9–10.3)
CO2: 33 mmol/L — AB (ref 22–32)
CREATININE: 1.5 mg/dL — AB (ref 0.44–1.00)
Chloride: 96 mmol/L — ABNORMAL LOW (ref 101–111)
GFR calc non Af Amer: 34 mL/min — ABNORMAL LOW (ref >60)
GFR, EST AFRICAN AMERICAN: 39 mL/min — AB (ref >60)
GLUCOSE: 173 mg/dL — AB (ref 65–99)
Potassium: 4.5 mmol/L (ref 3.5–5.1)
Sodium: 139 mmol/L (ref 135–145)

## 2015-09-08 LAB — TYPE AND SCREEN
ABO/RH(D): O NEG
Antibody Screen: NEGATIVE

## 2015-09-08 LAB — ECHOCARDIOGRAM COMPLETE
Height: 66 in
Weight: 3303.73 oz

## 2015-09-08 LAB — POCT I-STAT, CHEM 8
BUN: 31 mg/dL — AB (ref 6–20)
CALCIUM ION: 1.05 mmol/L — AB (ref 1.13–1.30)
Chloride: 92 mmol/L — ABNORMAL LOW (ref 101–111)
Creatinine, Ser: 1.4 mg/dL — ABNORMAL HIGH (ref 0.44–1.00)
Glucose, Bld: 205 mg/dL — ABNORMAL HIGH (ref 65–99)
HCT: 31 % — ABNORMAL LOW (ref 36.0–46.0)
Hemoglobin: 10.5 g/dL — ABNORMAL LOW (ref 12.0–15.0)
Potassium: 4.9 mmol/L (ref 3.5–5.1)
SODIUM: 135 mmol/L (ref 135–145)
TCO2: 29 mmol/L (ref 0–100)

## 2015-09-08 LAB — IRON AND TIBC
IRON: 10 ug/dL — AB (ref 28–170)
SATURATION RATIOS: 2 % — AB (ref 10.4–31.8)
TIBC: 465 ug/dL — AB (ref 250–450)
UIBC: 455 ug/dL

## 2015-09-08 LAB — FOLATE: Folate: 18.2 ng/mL (ref >5.9)

## 2015-09-08 LAB — RETICULOCYTES
RBC.: 4.02 MIL/uL (ref 3.87–5.11)
Retic Count, Absolute: 108.5 10*3/uL (ref 19.0–186.0)
Retic Ct Pct: 2.7 % (ref 0.4–3.1)

## 2015-09-08 LAB — URINALYSIS, ROUTINE W REFLEX MICROSCOPIC
BILIRUBIN URINE: NEGATIVE
GLUCOSE, UA: NEGATIVE mg/dL
HGB URINE DIPSTICK: NEGATIVE
Ketones, ur: NEGATIVE mg/dL
Leukocytes, UA: NEGATIVE
Nitrite: NEGATIVE
Protein, ur: NEGATIVE mg/dL
SPECIFIC GRAVITY, URINE: 1.009 (ref 1.005–1.030)
pH: 5 (ref 5.0–8.0)

## 2015-09-08 LAB — GLUCOSE, CAPILLARY
GLUCOSE-CAPILLARY: 199 mg/dL — AB (ref 65–99)
Glucose-Capillary: 176 mg/dL — ABNORMAL HIGH (ref 65–99)
Glucose-Capillary: 222 mg/dL — ABNORMAL HIGH (ref 65–99)
Glucose-Capillary: 157 mg/dL — ABNORMAL HIGH (ref 65–99)

## 2015-09-08 LAB — FERRITIN: FERRITIN: 8 ng/mL — AB (ref 11–307)

## 2015-09-08 LAB — ABO/RH: ABO/RH(D): O NEG

## 2015-09-08 LAB — VITAMIN B12: Vitamin B-12: 414 pg/mL (ref 180–914)

## 2015-09-08 LAB — TROPONIN I
TROPONIN I: 0.04 ng/mL — AB (ref <0.031)
TROPONIN I: 0.04 ng/mL — AB (ref <0.031)
TROPONIN I: 0.04 ng/mL — AB (ref <0.031)

## 2015-09-08 MED ORDER — SPIRONOLACTONE 25 MG PO TABS
25.0000 mg | ORAL_TABLET | Freq: Every morning | ORAL | Status: DC
  Administered 2015-09-08 – 2015-09-11 (×4): 25 mg via ORAL
  Filled 2015-09-08 (×5): qty 1

## 2015-09-08 MED ORDER — FUROSEMIDE 10 MG/ML IJ SOLN
60.0000 mg | Freq: Two times a day (BID) | INTRAMUSCULAR | Status: DC
Start: 2015-09-08 — End: 2015-09-12
  Administered 2015-09-08 – 2015-09-11 (×7): 60 mg via INTRAVENOUS
  Filled 2015-09-08 (×8): qty 6

## 2015-09-08 MED ORDER — DULOXETINE HCL 60 MG PO CPEP
60.0000 mg | ORAL_CAPSULE | Freq: Every day | ORAL | Status: DC
  Administered 2015-09-08 – 2015-09-28 (×20): 60 mg via ORAL
  Filled 2015-09-08 (×21): qty 1

## 2015-09-08 MED ORDER — PANTOPRAZOLE SODIUM 40 MG PO TBEC
40.0000 mg | DELAYED_RELEASE_TABLET | Freq: Every day | ORAL | Status: DC
Start: 2015-09-08 — End: 2015-09-28
  Administered 2015-09-08 – 2015-09-28 (×20): 40 mg via ORAL
  Filled 2015-09-08 (×21): qty 1

## 2015-09-08 MED ORDER — FUROSEMIDE 10 MG/ML IJ SOLN
40.0000 mg | Freq: Two times a day (BID) | INTRAMUSCULAR | Status: DC
  Administered 2015-09-08: 40 mg via INTRAVENOUS
  Filled 2015-09-08: qty 4

## 2015-09-08 MED ORDER — TRAMADOL HCL 50 MG PO TABS
50.0000 mg | ORAL_TABLET | Freq: Four times a day (QID) | ORAL | Status: DC | PRN
  Administered 2015-09-08 – 2015-09-12 (×6): 50 mg via ORAL
  Filled 2015-09-08 (×6): qty 1

## 2015-09-08 MED ORDER — DARIFENACIN HYDROBROMIDE ER 7.5 MG PO TB24
7.5000 mg | ORAL_TABLET | Freq: Every day | ORAL | Status: DC
Start: 2015-09-08 — End: 2015-09-28
  Administered 2015-09-08 – 2015-09-28 (×20): 7.5 mg via ORAL
  Filled 2015-09-08 (×21): qty 1

## 2015-09-08 MED ORDER — TRAZODONE HCL 50 MG PO TABS
50.0000 mg | ORAL_TABLET | Freq: Every day | ORAL | Status: DC
  Administered 2015-09-08 – 2015-09-22 (×15): 50 mg via ORAL
  Filled 2015-09-08 (×15): qty 1

## 2015-09-08 MED ORDER — ONDANSETRON HCL 4 MG/2ML IJ SOLN
4.0000 mg | Freq: Four times a day (QID) | INTRAMUSCULAR | Status: DC | PRN
  Administered 2015-09-19: 4 mg via INTRAVENOUS
  Filled 2015-09-08: qty 2

## 2015-09-08 MED ORDER — SODIUM CHLORIDE 0.9 % IV SOLN: 250.0000 mL | INTRAVENOUS | Status: DC | PRN

## 2015-09-08 MED ORDER — GABAPENTIN 300 MG PO CAPS
300.0000 mg | ORAL_CAPSULE | Freq: Three times a day (TID) | ORAL | Status: DC
  Filled 2015-09-08: qty 1

## 2015-09-08 MED ORDER — INSULIN ASPART 100 UNIT/ML ~~LOC~~ SOLN
0.0000 [IU] | Freq: Every day | SUBCUTANEOUS | Status: DC
  Administered 2015-09-09 (×2): 2 [IU] via SUBCUTANEOUS
  Administered 2015-09-12 – 2015-09-15 (×2): 3 [IU] via SUBCUTANEOUS
  Administered 2015-09-16: 2 [IU] via SUBCUTANEOUS
  Administered 2015-09-17 – 2015-09-20 (×2): 3 [IU] via SUBCUTANEOUS
  Administered 2015-09-24: 2 [IU] via SUBCUTANEOUS

## 2015-09-08 MED ORDER — SODIUM CHLORIDE 0.9 % IV SOLN
125.0000 mg | Freq: Once | INTRAVENOUS | Status: AC
  Administered 2015-09-08: 125 mg via INTRAVENOUS
  Filled 2015-09-08: qty 10

## 2015-09-08 MED ORDER — ACETAMINOPHEN 325 MG PO TABS: 650.0000 mg | ORAL_TABLET | ORAL | Status: DC | PRN

## 2015-09-08 MED ORDER — SODIUM CHLORIDE 0.9% FLUSH
3.0000 mL | Freq: Two times a day (BID) | INTRAVENOUS | Status: DC
  Administered 2015-09-08 – 2015-09-16 (×16): 3 mL via INTRAVENOUS
  Administered 2015-09-16 – 2015-09-17 (×2): 10 mL via INTRAVENOUS
  Administered 2015-09-18 – 2015-09-28 (×12): 3 mL via INTRAVENOUS

## 2015-09-08 MED ORDER — GABAPENTIN 300 MG PO CAPS
300.0000 mg | ORAL_CAPSULE | Freq: Three times a day (TID) | ORAL | Status: DC
  Administered 2015-09-08 – 2015-09-22 (×43): 300 mg via ORAL
  Filled 2015-09-08 (×43): qty 1

## 2015-09-08 MED ORDER — ATORVASTATIN CALCIUM 20 MG PO TABS
20.0000 mg | ORAL_TABLET | Freq: Every day | ORAL | Status: DC
  Administered 2015-09-08 – 2015-09-28 (×20): 20 mg via ORAL
  Filled 2015-09-08 (×21): qty 1

## 2015-09-08 MED ORDER — SODIUM CHLORIDE 0.9% FLUSH: 3.0000 mL | INTRAVENOUS | Status: DC | PRN

## 2015-09-08 MED ORDER — ALPRAZOLAM 0.5 MG PO TABS
0.5000 mg | ORAL_TABLET | Freq: Three times a day (TID) | ORAL | Status: DC | PRN
  Administered 2015-09-08 – 2015-09-22 (×13): 0.5 mg via ORAL
  Filled 2015-09-08 (×12): qty 1

## 2015-09-08 MED ORDER — MOMETASONE FURO-FORMOTEROL FUM 200-5 MCG/ACT IN AERO
2.0000 | INHALATION_SPRAY | Freq: Two times a day (BID) | RESPIRATORY_TRACT | Status: DC
  Administered 2015-09-08 – 2015-09-28 (×30): 2 via RESPIRATORY_TRACT
  Filled 2015-09-08 (×4): qty 8.8

## 2015-09-08 MED ORDER — INSULIN ASPART 100 UNIT/ML ~~LOC~~ SOLN
0.0000 [IU] | Freq: Three times a day (TID) | SUBCUTANEOUS | Status: DC
  Administered 2015-09-08: 3 [IU] via SUBCUTANEOUS
  Administered 2015-09-08 (×2): 2 [IU] via SUBCUTANEOUS
  Administered 2015-09-09: 1 [IU] via SUBCUTANEOUS
  Administered 2015-09-09 (×2): 2 [IU] via SUBCUTANEOUS
  Administered 2015-09-10: 5 [IU] via SUBCUTANEOUS
  Administered 2015-09-10 – 2015-09-11 (×3): 3 [IU] via SUBCUTANEOUS
  Administered 2015-09-11: 5 [IU] via SUBCUTANEOUS
  Administered 2015-09-11: 2 [IU] via SUBCUTANEOUS
  Administered 2015-09-12 – 2015-09-13 (×4): 3 [IU] via SUBCUTANEOUS
  Administered 2015-09-13: 2 [IU] via SUBCUTANEOUS
  Administered 2015-09-13: 5 [IU] via SUBCUTANEOUS
  Administered 2015-09-14: 2 [IU] via SUBCUTANEOUS
  Administered 2015-09-14: 3 [IU] via SUBCUTANEOUS
  Administered 2015-09-14 – 2015-09-15 (×2): 2 [IU] via SUBCUTANEOUS
  Administered 2015-09-15: 5 [IU] via SUBCUTANEOUS
  Administered 2015-09-15: 3 [IU] via SUBCUTANEOUS
  Administered 2015-09-16 – 2015-09-17 (×4): 2 [IU] via SUBCUTANEOUS
  Administered 2015-09-17: 5 [IU] via SUBCUTANEOUS
  Administered 2015-09-17: 2 [IU] via SUBCUTANEOUS
  Administered 2015-09-18: 5 [IU] via SUBCUTANEOUS
  Administered 2015-09-18: 2 [IU] via SUBCUTANEOUS
  Administered 2015-09-18 – 2015-09-19 (×2): 3 [IU] via SUBCUTANEOUS
  Administered 2015-09-20 – 2015-09-22 (×5): 2 [IU] via SUBCUTANEOUS
  Administered 2015-09-22: 3 [IU] via SUBCUTANEOUS
  Administered 2015-09-22: 2 [IU] via SUBCUTANEOUS
  Administered 2015-09-23: 3 [IU] via SUBCUTANEOUS
  Administered 2015-09-23: 1 [IU] via SUBCUTANEOUS
  Administered 2015-09-23: 3 [IU] via SUBCUTANEOUS
  Administered 2015-09-24: 2 [IU] via SUBCUTANEOUS
  Administered 2015-09-24: 7 [IU] via SUBCUTANEOUS

## 2015-09-08 NOTE — Progress Notes (Signed)
[facility] PROGRESS NOTE  Jillian Bright KCM:[id number] DOB: 10-05-1945 DOA: 09/07/2015   PCP: [name], MD  Length of stay: 1   Assessment/Plan: Active Problems:   Essential hypertension   COPD with emphysema ([place])   CAD (coronary artery disease)   DM type 2 (diabetes mellitus, type 2) ([place])   Acute on chronic diastolic CHF (congestive heart failure), NYHA class 1 (HCC)   On home oxygen therapy   Microcytic anemia   CHF (congestive heart failure) (HCC)   CHF exacerbation (HCC)   Abnormal chest x-ray   Acute on chronic congestive heart failure [facility])      Brief summary 70 year old female with history of diabetes mellitus, COPD, sleep apnea, pulmonary hypertension, obesity, history of coronary artery disease, congestive heart failure, who presents to the ER with worsening bilateral lower extremity swelling and shortness of breath with orthopnea.Patient is known history of COPD Oxygen ,3 liters at baseline as well as history of diastolic heart failure last echogram was in 2015 showing EF of 05%-69 diastolic,chronic   1 diastolic heart failure and moderate aortic stenosis  assessment and plan  Present on Admission:  . Acute on chronic diastolic CHF (congestive heart failure), NYHA class 1 (HCC) - also likely secondary to noncompliance  Continue telemetry, cycle cardiac enzymes, obtain serial ECG, to evaluate for ischemia as a cause of heart failure monitor daily weight diurese with IV lasix and monitor orthostatics and creatinine to avoid over diuresis. Repeat 2-D echo Hold off on ACE/ARBi due to renal insufficiency cardiology consult if no improvement with diuresis   . CAD (coronary artery disease) chronic continue home medications and cycle cardiac enzymes   . Essential hypertension chronic continue home medications   . Microcytic anemia possibly slow GI blood loss was obtain anemia panel Hemoccult stool type and screen transfuse for hemoglobin below  7 or if significantly symptomatic. Start patient on IV venofere  . Patient will need ferrous sulfate on discharge and GI evaluation as outpatient  . COPD with emphysema ([place]) stable continue home oxygen and inhalers Abnormal chest x-ray patient denies any cough or fever and no white blood cell count suspect moreover scarring or atelectasis. Repeat chest x-ray after patient has been diuresed to see if theres improvement.   Diabetes mellitus hold by mouth medications order sliding scale, start low-dose Lantus   Leg edema suspect likely secondary to heart failure but given concern of blood clot and risk factors including obesity Dopplers to rule out DVT  Prophylaxis: SCD     DVT prophylaxsis   Code Status:      Code Status Orders     DNR    Start     Ordered     Family Communication: Discussed in detail with the patient, all imaging results, lab results explained to the patient   Disposition Plan:  Anticipate discharge in 3-4 days    Consultants:  None    Procedures:  None   Antibiotics: Anti-infectives    None         HPI/Subjective: Continues to be short of breath and tachycardic on telemetry  Objective: Filed Vitals:   09/08/15 0500 09/08/15 0603 09/08/15 0808 09/08/15 1218  BP:  112/73  120/68  Pulse:  104  106  Temp:  97.6 F (36.4 C)  97.8 F (36.6 C)  TempSrc:  Oral  Oral  Resp:  16  18  Height:      Weight: 93.66 kg (206 lb 7.7 oz)  SpO2:  93% 93% 98%    Intake/Output Summary (Last 24 hours) at 09/08/15 1415 Last data filed at 09/08/15 1321  Gross per 24 hour  Intake    703 ml  Output   1650 ml  Net   -947 ml      Examination:  General exam: Appears calm and comfortable  Respiratory system: Clear to auscultation. Respiratory effort normal. Cardiovascular system: S1 & S2 heard, RRR. No JVD, murmurs, rubs, gallops or clicks. No pedal edema. Gastrointestinal system: Abdomen is nondistended, soft and nontender. No organomegaly  or masses felt. Normal bowel sounds heard. Central nervous system: Alert and oriented. No focal neurological deficits. Extremities: Symmetric 5 x 5 power. Skin: No rashes, lesions or ulcers Psychiatry: Judgement and insight appear normal. Mood & affect appropriate.     Data Reviewed: I have personally reviewed following labs and imaging studies  Micro Results No results found for this or any previous visit (from the past 240 hour(s)).  Radiology Reports Dg Chest 2 View  09/08/2015  CLINICAL DATA:  Followup for abnormal chest radiograph. Patient with leg swelling and shortness of breath since yesterday. History of COPD. EXAM: CHEST  2 VIEW COMPARISON:  09/07/2015 FINDINGS: Since the previous day's exam, there has been no significant change. Cardiac silhouette is mildly enlarged. There is right lung base opacity obscuring the right heart border right hemidiaphragm consistent with a combination of a moderate pleural effusion with either atelectasis or infiltrate. Lungs are hyperexpanded. There are irregularly thickened interstitial markings which are similar to the prior study. Interstitial markings are mildly increased when compared to a study dated 07/21/2014. IMPRESSION: 1. No change from the previous day's study. 2. Right basilar opacity reflecting a moderate pleural effusion with either atelectasis, pneumonia or a combination. 3. Mild cardiomegaly. Irregular interstitial thickening. A component of congestive heart failure should be considered. Electronically Signed   By: Lajean Manes M.D.   On: 09/08/2015 11:31   Dg Chest 2 View  09/07/2015  CLINICAL DATA:  Shortness of breath with bilateral leg swelling. EXAM: CHEST  2 VIEW COMPARISON:  11/17/2014 FINDINGS: There is cardiomegaly. Small to moderate right pleural effusion with right lower lobe atelectasis. No confluent opacity or effusion on the left. No overt edema. No acute bony abnormality. IMPRESSION: Cardiomegaly. Right lower lobe  atelectasis or infiltrate with small to moderate right pleural effusion. Electronically Signed   By: Rolm Baptise M.D.   On: 09/07/2015 21:42     CBC  Recent Labs Lab 09/07/15 2000  WBC 6.7  HGB 8.0*  HCT 30.4*  PLT 194  MCV 72.4*  MCH 19.0*  MCHC 26.3*  RDW 18.2*  LYMPHSABS 1.8  MONOABS 0.7  EOSABS 0.1  BASOSABS 0.0    Chemistries   Recent Labs Lab 09/07/15 2000 09/08/15 0713  NA 137 139  K 4.9 4.5  CL 94* 96*  CO2 30 33*  GLUCOSE 168* 173*  BUN 29* 29*  CREATININE 1.41* 1.50*  CALCIUM 8.7* 8.4*   ------------------------------------------------------------------------------------------------------------------ estimated creatinine clearance is 39.7 mL/min (by C-G formula based on Cr of 1.5). ------------------------------------------------------------------------------------------------------------------ No results for input(s): HGBA1C in the last 72 hours. ------------------------------------------------------------------------------------------------------------------ No results for input(s): CHOL, HDL, LDLCALC, TRIG, CHOLHDL, LDLDIRECT in the last 72 hours. ------------------------------------------------------------------------------------------------------------------ No results for input(s): TSH, T4TOTAL, T3FREE, THYROIDAB in the last 72 hours.  Invalid input(s): FREET3 ------------------------------------------------------------------------------------------------------------------  Recent Labs  09/08/15 0713  VITAMINB12 414  FOLATE 18.2  FERRITIN 8*  TIBC 465*  IRON 10*  RETICCTPCT 2.7  Coagulation profile No results for input(s): INR, PROTIME in the last 168 hours.  No results for input(s): DDIMER in the last 72 hours.  Cardiac Enzymes  Recent Labs Lab 09/07/15 2000 09/08/15 0029 09/08/15 0713  TROPONINI 0.04* 0.04* 0.04*    ------------------------------------------------------------------------------------------------------------------ Invalid input(s): POCBNP   CBG:  Recent Labs Lab 09/07/15 2356 09/08/15 0638 09/08/15 1116  GLUCAP 199* 176* 222*       Studies: Dg Chest 2 View  09/08/2015  CLINICAL DATA:  Followup for abnormal chest radiograph. Patient with leg swelling and shortness of breath since yesterday. History of COPD. EXAM: CHEST  2 VIEW COMPARISON:  09/07/2015 FINDINGS: Since the previous day's exam, there has been no significant change. Cardiac silhouette is mildly enlarged. There is right lung base opacity obscuring the right heart border right hemidiaphragm consistent with a combination of a moderate pleural effusion with either atelectasis or infiltrate. Lungs are hyperexpanded. There are irregularly thickened interstitial markings which are similar to the prior study. Interstitial markings are mildly increased when compared to a study dated 07/21/2014. IMPRESSION: 1. No change from the previous day's study. 2. Right basilar opacity reflecting a moderate pleural effusion with either atelectasis, pneumonia or a combination. 3. Mild cardiomegaly. Irregular interstitial thickening. A component of congestive heart failure should be considered. Electronically Signed   By: Lajean Manes M.D.   On: 09/08/2015 11:31   Dg Chest 2 View  09/07/2015  CLINICAL DATA:  Shortness of breath with bilateral leg swelling. EXAM: CHEST  2 VIEW COMPARISON:  11/17/2014 FINDINGS: There is cardiomegaly. Small to moderate right pleural effusion with right lower lobe atelectasis. No confluent opacity or effusion on the left. No overt edema. No acute bony abnormality. IMPRESSION: Cardiomegaly. Right lower lobe atelectasis or infiltrate with small to moderate right pleural effusion. Electronically Signed   By: Rolm Baptise M.D.   On: 09/07/2015 21:42      Lab Results  Component Value Date   HGBA1C 6.1* 11/17/2014    HGBA1C 7.2* 01/29/2014   HGBA1C 7.7* 12/29/2013   Lab Results  Component Value Date   MICROALBUR 1.6 12/29/2013   LDLCALC 109* 12/29/2013   CREATININE 1.50* 09/08/2015       Scheduled Meds: . atorvastatin  20 mg Oral Daily  . darifenacin  7.5 mg Oral Daily  . DULoxetine  60 mg Oral Daily  . furosemide  40 mg Intravenous BID  . gabapentin  300 mg Oral TID  . insulin aspart  0-5 Units Subcutaneous QHS  . insulin aspart  0-9 Units Subcutaneous TID WC  . mometasone-formoterol  2 puff Inhalation BID  . pantoprazole  40 mg Oral Daily  . sodium chloride flush  3 mL Intravenous Q12H  . spironolactone  25 mg Oral q morning - 10a  . traZODone  50 mg Oral QHS   Continuous Infusions:   Active Problems:   Essential hypertension   COPD with emphysema (HCC)   CAD (coronary artery disease)   DM type 2 (diabetes mellitus, type 2) (HCC)   Acute on chronic diastolic CHF (congestive heart failure), NYHA class 1 (HCC)   On home oxygen therapy   Microcytic anemia   CHF (congestive heart failure) (HCC)   CHF exacerbation (HCC)   Abnormal chest x-ray   Acute on chronic congestive heart failure ([place])    Time spent: 45 minutes   [facility] Pager [phone]. If 7PM-7AM, please contact night-coverage at [email or web address], password [facility] 09/08/2015, 2:15 PM  LOS: 1 day

## 2015-09-08 NOTE — Evaluation (Addendum)
Physical Therapy Evaluation Patient Details Name: Jillian Bright MRN: [id number] DOB: 1946/02/24 Today's Date: 09/08/2015   History of Present Illness  Jillian Bright is a 70 y.o. female has a past medical history of Diabetes mellitus; COPD (chronic obstructive pulmonary disease); Sleep apnea, obstructive; Pulmonary HTN ; Obesity; Hyperlipidemia; Myocardial infarction; Coronary artery disease; Anxiety; Shortness of breath; GERD (gastroesophageal reflux disease); Arthritis; CHF (congestive heart failure); Pneumonia; Bronchitis; and History of hiatal hernia. Admitted for Acute on chronic diastolic CHF  Clinical Impression  Pt admitted with above diagnosis. Pt currently with functional limitations due to the deficits listed below (see PT Problem List). Presents with mild confusion, decreased safety awareness, and instability with gait. Required min assist to safely ambulate today. Reports multiple falls at home. SpO2 dropped to mid 80s with ambulation on 4L supplemental O2, but improves after sitting. May benefit most from short term SNF prior to returning home. Pt will benefit from skilled PT to increase their independence and safety with mobility to allow discharge to the venue listed below.       Follow Up Recommendations SNF;Supervision/Assistance - 24 hour; although pending progress    Equipment Recommendations  None recommended by PT    Recommendations for Other Services OT consult     Precautions / Restrictions Precautions Precaution Comments: monitor O2 Restrictions Weight Bearing Restrictions: No      Mobility  Bed Mobility Overal bed mobility: Needs Assistance Bed Mobility: Sit to Supine       Sit to supine: Min assist   General bed mobility comments: min assist for RLE support back into bed. VC for technique  Transfers Overall transfer level: Needs assistance Equipment used: Rolling walker (2 wheeled);None Transfers: Sit to/from [employer] to  Stand: Min guard Stand pivot transfers: Min guard       General transfer comment: Min guard for safety. VC for hand placement. Performed sit<>Stand transition x2 from recliner and x1 with pivot to bed. Cues for safety, mild sway noted.  Ambulation/Gait Ambulation/Gait assistance: Min assist Ambulation Distance (Feet): 80 Feet Assistive device: Rolling walker (2 wheeled);None Gait Pattern/deviations: Step-through pattern;Decreased stride length;Trendelenburg;Trunk flexed;Drifts right/left Gait velocity: decreased Gait velocity interpretation: <1.8 ft/sec, indicative of risk for recurrent falls General Gait Details: Intermittently requiring min assist for balance. Reaching for rail when attempting to ambulate without RW. Improved stablity with RW for support. Cues for upright posture, forward gaze, and awareness of surrounding and safety. SpO2 dropped into mid 80s on 4L supplemental O2.  Stairs            Wheelchair Mobility    Modified Rankin (Stroke Patients Only)       Balance Overall balance assessment: Needs assistance;History of Falls Sitting-balance support: No upper extremity supported;Feet supported Sitting balance-Leahy Scale: Good     Standing balance support: No upper extremity supported Standing balance-Leahy Scale: Fair                               Pertinent Vitals/Pain Pain Assessment: No/denies pain    Home Living Family/patient expects to be discharged to:: Private residence Living Arrangements: Alone Available Help at Discharge: Friend(s);Family;Available PRN/intermittently (Niece is near by, friends check in on pt) Type of Home: House Home Access: Stairs to enter Entrance Stairs-Rails: Left Entrance Stairs-Number of Steps: 3 Home Layout: One level Home Equipment: Walker - 4 wheels;Tub bench;Cane - single point;Walker - standard      Prior Function Level of Independence:  Independent with assistive device(s)         Comments:  uses cane inside to ambulate, having trouble bathing.     Hand Dominance   Dominant Hand: Right    Extremity/Trunk Assessment   Upper Extremity Assessment: Defer to OT evaluation           Lower Extremity Assessment: Generalized weakness (Edema)         Communication   Communication: No difficulties  Cognition Arousal/Alertness: Awake/alert Behavior During Therapy: WFL for tasks assessed/performed Overall Cognitive Status: Impaired/Different from baseline Area of Impairment: Orientation Orientation Level: Disoriented to;Time ("May 1976 ")   Memory: Decreased short-term memory;Decreased recall of precautions              General Comments General comments (skin integrity, edema, etc.): Patient was sitting up in chair without supplemental O2 on when PT entered room. States she walked to the rest room and forgot to replace nasal cannula. Sats were 68%. 4L applied and returned to 93%.    Exercises        Assessment/Plan    PT Assessment Patient needs continued PT services  PT Diagnosis Difficulty walking;Abnormality of gait;Generalized weakness;Altered mental status   PT Problem List Decreased strength;Decreased activity tolerance;Decreased balance;Decreased mobility;Decreased knowledge of use of DME;Decreased safety awareness;Decreased cognition;Decreased knowledge of precautions;Cardiopulmonary status limiting activity;Obesity  PT Treatment Interventions DME instruction;Gait training;Functional mobility training;Therapeutic activities;Therapeutic exercise;Balance training;Cognitive remediation;Neuromuscular re-education;Patient/family education   PT Goals (Current goals can be found in the Care Plan section) Acute Rehab PT Goals Patient Stated Goal: Go home PT Goal Formulation: With patient Time For Goal Achievement: 09/22/15 Potential to Achieve Goals: Good    Frequency Min 3X/week   Barriers to discharge Decreased caregiver support lives alone     Co-evaluation               End of Session Equipment Utilized During Treatment: Gait belt;Oxygen Activity Tolerance: Patient limited by fatigue Patient left: in bed;with call bell/phone within reach;with bed alarm set Nurse Communication: Mobility status (sats)         Time: 7340-3709 PT Time Calculation (min) (ACUTE ONLY): 29 min   Charges:   PT Evaluation $PT Eval Moderate Complexity: 1 Procedure PT Treatments $Gait Training: 8-22 mins   PT G CodesEllouise Newer 09/08/2015, 3:52 PM  Camille Bal [place], [place]

## 2015-09-08 NOTE — Progress Notes (Signed)
   09/08/15 0808  Aerosol Therapy Tx  Medications Dulera  Delivery Device MDI  Pre-Treatment Pulse 101  Pre-Treatment Respirations 14  Treatment Tolerance Tolerated well  Treatment Given 1  RT Breath Sounds  Bilateral Breath Sounds Clear;Diminished  Oxygen Therapy/Pulse Ox  O2 Device Nasal Cannula  O2 Flow Rate (L/min) 4 L/min  SpO2 93 %  Patient wears 4lpm nasal cannula at home QHS.

## 2015-09-08 NOTE — Progress Notes (Signed)
*  PRELIMINARY RESULTS* Vascular Ultrasound Lower extremity venous duplex has been completed.  Preliminary findings: no evidence of DVT in visualized veins. Bilateral baker's cyst noted.  Landry Mellow, RDMS, RVT  09/08/2015, 10:53 AM

## 2015-09-08 NOTE — Consult Note (Signed)
   [facility] CM Inpatient Consult   09/08/2015  JAANAI SALEMI 15-Sep-1945 [id number] Patient screened for potential [place] Management services. Patient is eligible for [facility] Care Management services under patient's Medicare  plan. Met with the patient at bedside.  Patient has accepted [facility] EMMI COPD follow up calls at this time. Brochure given with contact information and encouraged her to call if she has needs.  She states currently she has nieces that lives in the area and her brother from [place] is a good support. No other [facility] Care Management needs identified. :   Natividad Brood, RN BSN [facility] Liaison  [phone] business mobile phone Toll free office [phone]

## 2015-09-09 LAB — COMPREHENSIVE METABOLIC PANEL
ALBUMIN: 3.5 g/dL (ref 3.5–5.0)
ALT: 24 U/L (ref 14–54)
AST: 28 U/L (ref 15–41)
Alkaline Phosphatase: 54 U/L (ref 38–126)
Anion gap: 11 (ref 5–15)
BUN: 29 mg/dL — AB (ref 6–20)
CHLORIDE: 91 mmol/L — AB (ref 101–111)
CO2: 37 mmol/L — AB (ref 22–32)
CREATININE: 1.41 mg/dL — AB (ref 0.44–1.00)
Calcium: 8.3 mg/dL — ABNORMAL LOW (ref 8.9–10.3)
GFR calc non Af Amer: 37 mL/min — ABNORMAL LOW (ref >60)
GFR, EST AFRICAN AMERICAN: 42 mL/min — AB (ref >60)
GLUCOSE: 234 mg/dL — AB (ref 65–99)
Potassium: 5 mmol/L (ref 3.5–5.1)
SODIUM: 139 mmol/L (ref 135–145)
Total Bilirubin: 1.1 mg/dL (ref 0.3–1.2)
Total Protein: 6.6 g/dL (ref 6.5–8.1)

## 2015-09-09 LAB — GLUCOSE, CAPILLARY
GLUCOSE-CAPILLARY: 184 mg/dL — AB (ref 65–99)
GLUCOSE-CAPILLARY: 244 mg/dL — AB (ref 65–99)
Glucose-Capillary: 152 mg/dL — ABNORMAL HIGH (ref 65–99)
Glucose-Capillary: 182 mg/dL — ABNORMAL HIGH (ref 65–99)
Glucose-Capillary: 150 mg/dL — ABNORMAL HIGH (ref 65–99)
Glucose-Capillary: 223 mg/dL — ABNORMAL HIGH (ref 65–99)

## 2015-09-09 LAB — CBC
HCT: 29.3 % — ABNORMAL LOW (ref 36.0–46.0)
Hemoglobin: 7.2 g/dL — ABNORMAL LOW (ref 12.0–15.0)
MCH: 18 pg — ABNORMAL LOW (ref 26.0–34.0)
MCHC: 24.6 g/dL — ABNORMAL LOW (ref 30.0–36.0)
MCV: 73.3 fL — ABNORMAL LOW (ref 78.0–100.0)
PLATELETS: 162 10*3/uL (ref 150–400)
RBC: 4 MIL/uL (ref 3.87–5.11)
RDW: 18.1 % — ABNORMAL HIGH (ref 11.5–15.5)
WBC: 5.3 10*3/uL (ref 4.0–10.5)

## 2015-09-09 LAB — HEMOGLOBIN A1C
HEMOGLOBIN A1C: 7.3 % — AB (ref 4.8–5.6)
MEAN PLASMA GLUCOSE: 163 mg/dL

## 2015-09-09 NOTE — Evaluation (Signed)
Occupational Therapy Evaluation Patient Details Name: Jillian Bright MRN: [id number] DOB: 03-May-1946 Today's Date: 09/09/2015    History of Present Illness Jillian Bright is a 70 y.o. female has a past medical history of Diabetes mellitus; COPD (chronic obstructive pulmonary disease); Sleep apnea, obstructive; Pulmonary HTN ; Obesity; Hyperlipidemia; Myocardial infarction; Coronary artery disease; Anxiety; Shortness of breath; GERD (gastroesophageal reflux disease); Arthritis; CHF (congestive heart failure); Pneumonia; Bronchitis; and History of hiatal hernia. Admitted for Acute on chronic diastolic CHF   Clinical Impression   Pt educated on energy conservation and need for rollator to help with balance for transfers/ assist with carrying oxygen in the community. Pt expressed less community participation due to incr edema in bil LE and inability to carry oxygen tank. Pt states "hate to cook and enjoy eating out with my friends." pt educated on daily weighting at the same time and to alert the doctor after discharge with any incr of weight past 3 pounds immediately. Pt repeating information back to therapist using teach back method. Pt agreeable to SNF placement at this time.   Oxygen 3 L 93% with chair to commode transfer. Pt unable to reach BIL LE and will require (A) for LB dressing.     Follow Up Recommendations  SNF    Equipment Recommendations  None recommended by OT    Recommendations for Other Services       Precautions / Restrictions Precautions Precautions: Fall Precaution Comments: monitor O2 Restrictions Weight Bearing Restrictions: No      Mobility Bed Mobility               General bed mobility comments: in chair on arrival  Transfers Overall transfer level: Needs assistance Equipment used: Rolling walker (2 wheeled);None Transfers: Sit to/from Stand Sit to Stand: Min guard Stand pivot transfers: Supervision       General transfer comment: cues for  safety. pt declines RW. pt states "it makes me do worse for someone to be near me"    Balance Overall balance assessment: Needs assistance Sitting-balance support: No upper extremity supported;Feet supported Sitting balance-Leahy Scale: Good     Standing balance support: Single extremity supported;During functional activity Standing balance-Leahy Scale: Fair Standing balance comment: Able to stand and pull briefs down to use the bedside commode but needed one hand to support balance.                             ADL Overall ADL's : Needs assistance/impaired     Grooming: Wash/dry hands;Min guard;Standing Grooming Details (indicate cue type and reason): pt requires furniture walking across room for balance. Declined RW             Lower Body Dressing: Maximal assistance;Sit to/from stand Lower Body Dressing Details (indicate cue type and reason): max (A) to adjust socks Toilet Transfer: Min guard;Ambulation;BSC Toilet Transfer Details (indicate cue type and reason): declined RW but holding onto rolling bedside tray and bed foot board. Toileting- Water quality scientist and Hygiene: Min guard;Sit to/from stand Toileting - Clothing Manipulation Details (indicate cue type and reason): pt static standing for peri care     Functional mobility during ADLs: Min guard General ADL Comments: pt requires environmental hand holds during transfers. pt expresses trouble carrying oxygen tank at baseline and pain in R shoulder at times from the tank. pt educated on possible use of rollator to allow a seat and place to push oxygen. pt states "i hadn't thought about  that" pt educated on Energy conservation techiques and possible crock pot options to allow cooking from home. pt reports "i hate cooking but i just dont have the abilty to carry that oxygen in and out of resturants any more. I hope to get one of those purse ones at my appointment on Tuesday. it has taken me 6 months to get to go.  Do you think I can make it?"  Pt prefers to not have DME due to the "appearance" of the DME     Vision     Perception     Praxis      Pertinent Vitals/Pain Pain Assessment: No/denies pain     Hand Dominance Right   Extremity/Trunk Assessment Upper Extremity Assessment Upper Extremity Assessment: Overall WFL for tasks assessed   Lower Extremity Assessment Lower Extremity Assessment: Defer to PT evaluation   Cervical / Trunk Assessment Cervical / Trunk Assessment: Kyphotic   Communication Communication Communication: No difficulties   Cognition Arousal/Alertness: Awake/alert Behavior During Therapy: WFL for tasks assessed/performed Overall Cognitive Status: Within Functional Limits for tasks assessed                     General Comments       Exercises       Shoulder Instructions      Home Living Family/patient expects to be discharged to:: Skilled nursing facility                                        Prior Functioning/Environment Level of Independence: Independent with assistive device(s)        Comments: uses cane inside to ambulate, having trouble bathing.    OT Diagnosis: Generalized weakness   OT Problem List: Decreased strength;Decreased activity tolerance;Impaired balance (sitting and/or standing);Decreased safety awareness;Decreased knowledge of use of DME or AE;Decreased knowledge of precautions;Cardiopulmonary status limiting activity;Obesity   OT Treatment/Interventions: Self-care/ADL training;Therapeutic exercise;Energy conservation;DME and/or AE instruction;Therapeutic activities;Patient/family education;Balance training    OT Goals(Current goals can be found in the care plan section) Acute Rehab OT Goals Patient Stated Goal: to return home and go to an appointment tuesday 09/13/15 that has taken 6 months to get OT Goal Formulation: With patient Time For Goal Achievement: 09/23/15 Potential to Achieve Goals: Good   OT Frequency: Min 2X/week   Barriers to D/C: Decreased caregiver support          Co-evaluation              End of Session Equipment Utilized During Treatment: Oxygen Nurse Communication: Mobility status;Precautions  Activity Tolerance: Patient tolerated treatment well Patient left: in chair;with call bell/phone within reach   Time: 1431-1505 OT Time Calculation (min): 34 min Charges:  OT General Charges $OT Visit: 1 Procedure OT Evaluation $OT Eval Moderate Complexity: 1 Procedure OT Treatments $Self Care/Home Management : 8-22 mins G-Codes:    Parke Poisson B Sep 17, 2015, 3:27 PM   Jeri Modena   OTR/L Pager: [phone] Office: [phone] .

## 2015-09-09 NOTE — Clinical Documentation Improvement (Signed)
Internal Medicine  Can the diagnosis of Respiratory status be further specified in progress notes and discharge summary?   Acute respiratory failure  Acute on Chronic respiratory failure  Chronic respiratory failure  Other  Clinically Undetermined  Document any associated diagnoses/conditions.   Supporting Information:  70 year old female who is on 3L/m [place] O2 at home. History of COPD, OSA, Obesity, MI, CAD, CHF, pulmonary htn & Bronchitis O2 Sat in ER 81% & 86% on Room Air Currently on 4L/m via [place]  ED Provider note: Pulmonary/Chest: Effort normal. She has rales.  Rales right base   H&P:   IN ER: Hg 8.0, Cr 1.4 Hypoxic down to 86% improved after being back on her 40 L of oxygen which is her baseline Patient is known history of COPD O3 liters at baseline as well as history of diastolic heart failure last echogram was in 2015 showing EF of 44%-01 [place] 1 diastolic heart failure and moderate aortic stenosis. Pertinent positives include: shortness of breath at rest. Bilateral lower extremity swelling  6. Lungs: no wheezes Occasional mild crackles at the bases  Treatment  Keep O2 sats greater than 92%  Inhaler treatments     Please exercise your independent, professional judgment when responding. A specific answer is not anticipated or expected.   Thank You,  [place] [phone]

## 2015-09-09 NOTE — Progress Notes (Signed)
PROGRESS NOTE    Jillian Bright  LGX:[id number] DOB: 1946-01-09 DOA: 09/07/2015 PCP: [name], MD  Outpatient Specialists:     Brief Narrative: 70 year old female with history of diabetes mellitus, COPD, sleep apnea, pulmonary hypertension, obesity, history of coronary artery disease, congestive heart failure, who presents to the ER with worsening bilateral lower extremity swelling and shortness of breath with orthopnea.Patient is known history of COPD Oxygen ,3 liters at baseline as well as history of diastolic heart failure last echogram was in 2015 showing EF of 81%-44 diastolic,chronic 1 diastolic heart failure and moderate aortic stenosis   Assessment & Plan:   Active Problems:   Essential hypertension   COPD with emphysema (HCC)   CAD (coronary artery disease)   DM type 2 (diabetes mellitus, type 2) (HCC)   Acute on chronic diastolic CHF (congestive heart failure), NYHA class 1 (HCC)   On home oxygen therapy   Microcytic anemia   CHF (congestive heart failure) (HCC)   CHF exacerbation (HCC)   Abnormal chest x-ray   Acute on chronic congestive heart failure ([place])   . Acute on chronic diastolic CHF (congestive heart failure), NYHA class 1 ([place]) - also likely secondary to noncompliance  Continue telemetry, cycle cardiac enzymes, obtain serial ECG monitor daily weight diurese with IV lasix and monitor orthostatics and creatinine to avoid over diuresis. Repeat 2-D echo pending Hold off on ACE/ARBi due to renal insufficiency Would consult cardiology if no improvement with diuresis   . CAD (coronary artery disease) chronic continue home medications and cycle cardiac enzymes  . Essential hypertension chronic continue home medications   . Microcytic anemia possibly slow GI blood loss was obtain anemia panel Hemoccult stool type and screen transfuse for hemoglobin below 7 or if significantly symptomatic. Start patient on IV iron . Patient will need ferrous  sulfate on discharge and GI evaluation as outpatient  . COPD with emphysema ([place]) stable continue home oxygen and inhalers Abnormal chest x-ray patient denies any cough or fever and no white blood cell count suspect moreover scarring or atelectasis   Diabetes mellitus hold by mouth medications order sliding scale, cont low-dose Lantus   Leg edema suspect likely secondary to heart failure. LE dopplers neg for DVT   DVT prophylaxis: SCD's Code Status:DNR Family Communication: Patient in room Disposition Plan: Uncertain at this time, possible home in 48-72hrs   Consultants:     Procedures:     Antimicrobials:     Subjective: Questioning about going home.   Objective: Filed Vitals:   09/09/15 0118 09/09/15 0448 09/09/15 0839 09/09/15 1202  BP: 96/66 104/75  112/77  Pulse: 104 101 99 88  Temp: 97.6 F (36.4 C) 97.6 F (36.4 C)  98 F (36.7 C)  TempSrc: Oral Oral  Oral  Resp: '18 20 16 18  '$ Height:      Weight:  92.806 kg (204 lb 9.6 oz)    SpO2: 91% 95%  98%    Intake/Output Summary (Last 24 hours) at 09/09/15 1629 Last data filed at 09/09/15 1354  Gross per 24 hour  Intake    243 ml  Output   3175 ml  Net  -2932 ml   Filed Weights   09/08/15 0003 09/08/15 0500 09/09/15 0448  Weight: 93.668 kg (206 lb 8 oz) 93.66 kg (206 lb 7.7 oz) 92.806 kg (204 lb 9.6 oz)    Examination:  General exam: Appears calm and comfortable  Respiratory system: Clear to auscultation. Respiratory effort normal. Cardiovascular system: S1 &  S2 heard, RRR. No JVD, murmurs, rubs, gallops or clicks.  Gastrointestinal system: Abdomen is nondistended, soft and nontender. No organomegaly or masses felt. Normal bowel sounds heard. Central nervous system: Alert and oriented. No focal neurological deficits. Extremities: Symmetric 5 x 5 power. B LE pitting edema Skin: No rashes, lesions or ulcers Psychiatry: Judgement and insight appear normal. Mood & affect appropriate.     Data  Reviewed: I have personally reviewed following labs and imaging studies  CBC:  Recent Labs Lab 09/07/15 1819 09/07/15 2000 09/09/15 0359  WBC  --  6.7 5.3  NEUTROABS  --  4.1  --   HGB 10.5* 8.0* 7.2*  HCT 31.0* 30.4* 29.3*  MCV  --  72.4* 73.3*  PLT  --  194 283   Basic Metabolic Panel:  Recent Labs Lab 09/07/15 1819 09/07/15 2000 09/08/15 0713 09/09/15 0359  NA 135 137 139 139  K 4.9 4.9 4.5 5.0  CL 92* 94* 96* 91*  CO2  --  30 33* 37*  GLUCOSE 205* 168* 173* 234*  BUN 31* 29* 29* 29*  CREATININE 1.40* 1.41* 1.50* 1.41*  CALCIUM  --  8.7* 8.4* 8.3*   GFR: Estimated Creatinine Clearance: 42 mL/min (by C-G formula based on Cr of 1.41). Liver Function Tests:  Recent Labs Lab 09/09/15 0359  AST 28  ALT 24  ALKPHOS 54  BILITOT 1.1  PROT 6.6  ALBUMIN 3.5   No results for input(s): LIPASE, AMYLASE in the last 168 hours. No results for input(s): AMMONIA in the last 168 hours. Coagulation Profile: No results for input(s): INR, PROTIME in the last 168 hours. Cardiac Enzymes:  Recent Labs Lab 09/07/15 2000 09/08/15 0029 09/08/15 0713 09/08/15 1250  TROPONINI 0.04* 0.04* 0.04* 0.04*   BNP (last 3 results) No results for input(s): PROBNP in the last 8760 hours. HbA1C:  Recent Labs  09/08/15 0713  HGBA1C 7.3*   CBG:  Recent Labs Lab 09/08/15 1654 09/08/15 2223 09/09/15 0114 09/09/15 0659 09/09/15 1203  GLUCAP 157* 184* 244* 152* 182*   Lipid Profile: No results for input(s): CHOL, HDL, LDLCALC, TRIG, CHOLHDL, LDLDIRECT in the last 72 hours. Thyroid Function Tests: No results for input(s): TSH, T4TOTAL, FREET4, T3FREE, THYROIDAB in the last 72 hours. Anemia Panel:  Recent Labs  09/08/15 0713  VITAMINB12 414  FOLATE 18.2  FERRITIN 8*  TIBC 465*  IRON 10*  RETICCTPCT 2.7   Urine analysis:    Component Value Date/Time   COLORURINE YELLOW 09/08/2015 0137   APPEARANCEUR CLEAR 09/08/2015 0137   LABSPEC 1.009 09/08/2015 0137   PHURINE  5.0 09/08/2015 0137   GLUCOSEU NEGATIVE 09/08/2015 0137   HGBUR NEGATIVE 09/08/2015 0137   HGBUR negative 08/04/2008 1526   BILIRUBINUR NEGATIVE 09/08/2015 0137   BILIRUBINUR n 11/24/2012 1335   KETONESUR NEGATIVE 09/08/2015 0137   PROTEINUR NEGATIVE 09/08/2015 0137   PROTEINUR trace 11/24/2012 1335   UROBILINOGEN 0.2 11/18/2014 0750   UROBILINOGEN 0.2 11/24/2012 1335   NITRITE NEGATIVE 09/08/2015 0137   NITRITE n 11/24/2012 1335   LEUKOCYTESUR NEGATIVE 09/08/2015 0137   Sepsis Labs: '@LABRCNTIP'$ (procalcitonin:4,lacticidven:4)  )No results found for this or any previous visit (from the past 240 hour(s)).       Radiology Studies: Dg Chest 2 View  09/08/2015  CLINICAL DATA:  Followup for abnormal chest radiograph. Patient with leg swelling and shortness of breath since yesterday. History of COPD. EXAM: CHEST  2 VIEW COMPARISON:  09/07/2015 FINDINGS: Since the previous day's exam, there has been no significant  change. Cardiac silhouette is mildly enlarged. There is right lung base opacity obscuring the right heart border right hemidiaphragm consistent with a combination of a moderate pleural effusion with either atelectasis or infiltrate. Lungs are hyperexpanded. There are irregularly thickened interstitial markings which are similar to the prior study. Interstitial markings are mildly increased when compared to a study dated 07/21/2014. IMPRESSION: 1. No change from the previous day's study. 2. Right basilar opacity reflecting a moderate pleural effusion with either atelectasis, pneumonia or a combination. 3. Mild cardiomegaly. Irregular interstitial thickening. A component of congestive heart failure should be considered. Electronically Signed   By: Lajean Manes M.D.   On: 09/08/2015 11:31   Dg Chest 2 View  09/07/2015  CLINICAL DATA:  Shortness of breath with bilateral leg swelling. EXAM: CHEST  2 VIEW COMPARISON:  11/17/2014 FINDINGS: There is cardiomegaly. Small to moderate right  pleural effusion with right lower lobe atelectasis. No confluent opacity or effusion on the left. No overt edema. No acute bony abnormality. IMPRESSION: Cardiomegaly. Right lower lobe atelectasis or infiltrate with small to moderate right pleural effusion. Electronically Signed   By: Rolm Baptise M.D.   On: 09/07/2015 21:42        Scheduled Meds: . atorvastatin  20 mg Oral Daily  . darifenacin  7.5 mg Oral Daily  . DULoxetine  60 mg Oral Daily  . furosemide  60 mg Intravenous BID  . gabapentin  300 mg Oral TID  . insulin aspart  0-5 Units Subcutaneous QHS  . insulin aspart  0-9 Units Subcutaneous TID WC  . mometasone-formoterol  2 puff Inhalation BID  . pantoprazole  40 mg Oral Daily  . sodium chloride flush  3 mL Intravenous Q12H  . spironolactone  25 mg Oral q morning - 10a  . traZODone  50 mg Oral QHS   Continuous Infusions:    LOS: 2 days      Jillian Bright, Orpah Melter, MD [facility] Pager [phone]  If 7PM-7AM, please contact night-coverage [email or web address] Password [facility] 09/09/2015, 4:29 PM

## 2015-09-09 NOTE — NC FL2 (Addendum)
[place] LEVEL OF CARE SCREENING TOOL     IDENTIFICATION  Patient Name: Jillian Bright Birthdate: 27-Mar-1946 Sex: female Admission Date (Current Location): 09/07/2015  [facility] [place] and [place] Number:  Herbalist and Address:  [facility], [place] [address], [place], [place] [address]      Provider Number: [id number]  Attending Physician Name and Address:  Berle Mull, MD  Relative Name and Phone Number:  Zieg,Rene Niece [phone]    Current Level of Care:   Recommended Level of Care:   Prior Approval Number:    Date Approved/Denied:   PASRR Number: [id number]  Discharge Plan: SNF    Current Diagnoses: Patient Active Problem List   Diagnosis Date Noted  . Abnormal chest x-ray   . Acute on chronic congestive heart failure ([place])   . Acute on chronic diastolic CHF (congestive heart failure), NYHA class 1 ([place]) 09/07/2015  . On home oxygen therapy 09/07/2015  . Microcytic anemia 09/07/2015  . CHF (congestive heart failure) ([place]) 09/07/2015  . CHF exacerbation ([place]) 09/07/2015  . Pressure ulcer 11/18/2014  . DM type 2 (diabetes mellitus, type 2) ([place]) 11/18/2014  . Syncope 11/17/2014  . Acute on chronic respiratory failure with hypercapnia ([place]) 11/17/2014  . 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 Weakness 01/29/2014  . Abdominal pain 01/29/2014  . Carotid stenosis 06/23/2013  . Rheumatoid arthritis ([place]) 06/08/2013  . Fibromyalgia 06/08/2013  . Occlusion and stenosis of carotid artery without mention of cerebral infarction 12/02/2012  . Aortic stenosis 01/14/2012  . CAD (coronary artery disease) 11/07/2011  . Obesity hypoventilation syndrome ([place]) 06/10/2009  . COPD with emphysema ([place]) 05/30/2009  . Pulmonary HTN ([place]) 05/25/2009  . ABDOMINAL PAIN, LOWER 08/04/2008  . DIABETES MELLITUS, TYPE II 08/18/2007  . Chronic diastolic heart failure ([place]) 08/18/2007  . Other and unspecified  hyperlipidemia 08/04/2007  . Generalized anxiety disorder 08/04/2007  . Essential hypertension 08/04/2007  . GERD 08/04/2007  . DEGENERATIVE JOINT DISEASE 08/04/2007  . DEPENDENT EDEMA 08/04/2007    Orientation RESPIRATION BLADDER Height & Weight     Self, Time, Situation, Place  O2 (4L) Incontinent Weight: 204 lb 9.6 oz (92.806 kg) Height:  '5\' 6"'$  (167.6 cm)  BEHAVIORAL SYMPTOMS/MOOD NEUROLOGICAL BOWEL NUTRITION STATUS      Continent Diet Carb Modified  AMBULATORY STATUS COMMUNICATION OF NEEDS Skin   Limited Assist Verbally PU Stage and Appropriate Care         Stage 2 dressing change PRN              Personal Care Assistance Level of Assistance  Bathing, Dressing Bathing Assistance: Limited assistance   Dressing Assistance: Limited assistance     Functional Limitations Info             SPECIAL CARE FACTORS FREQUENCY  PT (By licensed PT)     PT Frequency: 5x a week              Contractures      Additional Factors Info  Code Status, Allergies, Insulin Sliding Scale Code Status Info: DNR Allergies Info: Codeine   Insulin Sliding Scale Info: 3x a day       Current Medications (09/09/2015):  This is the current hospital active medication list Current Facility-Administered Medications  Medication Dose Route Frequency Provider Last Rate Last Dose  . 0.9 %  sodium chloride infusion  250 mL Intravenous PRN Toy Baker, MD      .  acetaminophen (TYLENOL) tablet 650 mg  650 mg Oral Q4H PRN Toy Baker, MD      . ALPRAZolam Duanne Moron) tablet 0.5 mg  0.5 mg Oral TID PRN Toy Baker, MD   0.5 mg at 09/08/15 1144  . atorvastatin (LIPITOR) tablet 20 mg  20 mg Oral Daily Toy Baker, MD   20 mg at 09/09/15 0938  . darifenacin (ENABLEX) 24 hr tablet 7.5 mg  7.5 mg Oral Daily Toy Baker, MD   7.5 mg at 09/09/15 0938  . DULoxetine (CYMBALTA) DR capsule 60 mg  60 mg Oral Daily Toy Baker, MD   60 mg at 09/09/15 0938  . furosemide  (LASIX) injection 60 mg  60 mg Intravenous BID Reyne Dumas, MD   60 mg at 09/09/15 0843  . gabapentin (NEURONTIN) capsule 300 mg  300 mg Oral TID Toy Baker, MD   300 mg at 09/09/15 1614  . insulin aspart (novoLOG) injection 0-5 Units  0-5 Units Subcutaneous QHS Toy Baker, MD   2 Units at 09/09/15 0146  . insulin aspart (novoLOG) injection 0-9 Units  0-9 Units Subcutaneous TID WC Toy Baker, MD   2 Units at 09/09/15 1232  . mometasone-formoterol (DULERA) 200-5 MCG/ACT inhaler 2 puff  2 puff Inhalation BID Toy Baker, MD   2 puff at 09/09/15 0837  . ondansetron (ZOFRAN) injection 4 mg  4 mg Intravenous Q6H PRN Toy Baker, MD      . pantoprazole (PROTONIX) EC tablet 40 mg  40 mg Oral Daily Toy Baker, MD   40 mg at 09/09/15 0938  . sodium chloride flush (NS) 0.9 % injection 3 mL  3 mL Intravenous Q12H Toy Baker, MD   3 mL at 09/09/15 0938  . sodium chloride flush (NS) 0.9 % injection 3 mL  3 mL Intravenous PRN Toy Baker, MD      . spironolactone (ALDACTONE) tablet 25 mg  25 mg Oral q morning - 10a Toy Baker, MD   25 mg at 09/09/15 0938  . traMADol (ULTRAM) tablet 50 mg  50 mg Oral Q6H PRN Toy Baker, MD   50 mg at 09/08/15 0109  . traZODone (DESYREL) tablet 50 mg  50 mg Oral QHS Toy Baker, MD   50 mg at 09/08/15 2140     Discharge Medications: Please see discharge summary for a list of discharge medications.  Relevant Imaging Results:  Relevant Lab Results:   Additional Information SSN [id number]  Ross Ludwig, [place]

## 2015-09-09 NOTE — Clinical Social Work Note (Signed)
Clinical Social Work Assessment  Patient Details  Name: Jillian Bright MRN: [id number] Date of Birth: 20-Aug-1945  Date of referral:  09/09/15               Reason for consult:  Facility Placement                Permission sought to share information with:  Facility Sport and exercise psychologist, Family Supports Permission granted to share information::  Yes, Verbal Permission Granted  Name::        Agency::  SNF admissions  Relationship::     Contact Information:     Housing/Transportation Living arrangements for the past 2 months:  Single Family Home Source of Information:  Patient Patient Interpreter Needed:  None Criminal Activity/Legal Involvement Pertinent to Current Situation/Hospitalization:  No - Comment as needed Significant Relationships:  Other Family Members Lives with:  Self Do you feel safe going back to the place where you live?  No (Patient feels she needs some short term rehab before she can go home.) Need for family participation in patient care:  No (Coment)  Care giving concerns: Patient feel she needs some short term rehab before returning back home   Social Worker assessment / plan:  Patient is a 70 year old female who lives by herself and is alert and oriented x4.  Patient expresses that she has been to rehab before and is aware of the process involved.  Patient expressed she has been to [facility] in the past and had a good experience there, however she is open to going to a differen SNF for short term rehab.  Patient expressed the last time she was in [place], but may be interested in a different SNF.  Patient expressed she did not have any other questions or concerns.  Employment status:  Retired Forensic scientist:  Medicare PT Recommendations:  [place] / Referral to community resources:  [place]  Patient/Family's Response to care:  Patient in agreement to going to a SNF for short term  rehab.  Patient/Family's Understanding of and Emotional Response to Diagnosis, Current Treatment, and Prognosis:  Patient aware of current diagnosis and treatment plan.  Emotional Assessment Appearance:    Attitude/Demeanor/Rapport:    Affect (typically observed):  Appropriate, Pleasant, Stable Orientation:  Oriented to Self, Oriented to Place, Oriented to  Time, Oriented to Situation Alcohol / Substance use:  Not Applicable Psych involvement (Current and /or in the community):  No (Comment)  Discharge Needs  Concerns to be addressed:  Lack of Support Readmission within the last 30 days:  No Current discharge risk:  Lives alone Barriers to Discharge:  No Barriers Identified   Anell Barr 09/09/2015, 5:36 PM

## 2015-09-09 NOTE — Progress Notes (Signed)
Physical Therapy Treatment Patient Details Name: Jillian Bright MRN: [id number] DOB: May 07, 1946 Today's Date: 09/09/2015    History of Present Illness Jillian Bright is a 70 y.o. female has a past medical history of Diabetes mellitus; COPD (chronic obstructive pulmonary disease); Sleep apnea, obstructive; Pulmonary HTN ; Obesity; Hyperlipidemia; Myocardial infarction; Coronary artery disease; Anxiety; Shortness of breath; GERD (gastroesophageal reflux disease); Arthritis; CHF (congestive heart failure); Pneumonia; Bronchitis; and History of hiatal hernia. Admitted for Acute on chronic diastolic CHF    PT Comments    Pt able to improve ambulation distance today but c/o of leg fatigue towards end of walk. Pt's balance has improved but still somewhat unsteady and higher level balance activities are difficult. Pt will continue to benefit from PT services to improve independence and safety with functional mobility. At this time still think pt would benefit from SNF rehab to decrease fall risk before returning home since pt lives alone but if pt continues to progress well through the weekend she may not need SNF. Will continue to assess. Encouraged pt to ambulate with nursing at least 2x/day throughout the weekend.    Follow Up Recommendations  SNF;Supervision/Assistance - 24 hour     Equipment Recommendations  None recommended by PT    Recommendations for Other Services OT consult     Precautions / Restrictions Precautions Precautions: Fall Precaution Comments: monitor O2 Restrictions Weight Bearing Restrictions: No    Mobility  Bed Mobility               General bed mobility comments: Pt in chair upon entry to room  Transfers Overall transfer level: Needs assistance Equipment used: Rolling walker (2 wheeled);None Transfers: Sit to/from [facility] Sit to Stand: Supervision Stand pivot transfers: Supervision       General transfer comment: Supervision  for safety. VC's for hand placement on RW. Stood from chair and pivoted to bedside commode.   Ambulation/Gait Ambulation/Gait assistance: Min guard Ambulation Distance (Feet): 150 Feet Assistive device: Rolling walker (2 wheeled) Gait Pattern/deviations: Step-through pattern;Decreased stride length;Trunk flexed;Drifts right/left Gait velocity: decreased Gait velocity interpretation: <1.8 ft/sec, indicative of risk for recurrent falls General Gait Details: Pt improved with ambulation today. Able to increase distance, but c/o of leg fatigue towards end of walk. Somewhat impulsive to try and get to chair quickly. VC's for correct use of RW. Min Guard for safety.    Stairs            Wheelchair Mobility    Modified Rankin (Stroke Patients Only)       Balance Overall balance assessment: Needs assistance Sitting-balance support: No upper extremity supported;Feet supported Sitting balance-Leahy Scale: Good     Standing balance support: Single extremity supported Standing balance-Leahy Scale: Fair Standing balance comment: Able to stand and pull briefs down to use the bedside commode but needed one hand to support balance.                     Cognition Arousal/Alertness: Awake/alert Behavior During Therapy: WFL for tasks assessed/performed Overall Cognitive Status: Within Functional Limits for tasks assessed                      Exercises      General Comments General comments (skin integrity, edema, etc.): Pt pleasant and cooperative with therapy.       Pertinent Vitals/Pain Pain Assessment: No/denies pain  Vitals stable throughout activity on 4L [place]. Had trouble getting accurate SpO2 but seems to  stay above 95% on 4L [place].     Home Living                      Prior Function            PT Goals (current goals can now be found in the care plan section) Acute Rehab PT Goals Patient Stated Goal: To go home PT Goal Formulation: With  patient Time For Goal Achievement: 09/22/15 Potential to Achieve Goals: Good Progress towards PT goals: Progressing toward goals    Frequency  Min 3X/week    PT Plan Current plan remains appropriate    Co-evaluation             End of Session Equipment Utilized During Treatment: Gait belt;Oxygen Activity Tolerance: Patient tolerated treatment well Patient left: in chair;with call bell/phone within reach;with chair alarm set     Time: 1141-1157 PT Time Calculation (min) (ACUTE ONLY): 16 min  Charges:  $Gait Training: 8-22 mins                    G Codes:      [place], SPT [place] 09/09/2015, 1:01 PM

## 2015-09-09 NOTE — Clinical Social Work Note (Signed)
CSW met with patient who stated she would like to go to SNF for short term rehab before she returns back home.  Patient stated she has been at [facility] before, but is interested in going to [facility] once she is medically ready for discharge.  CSW to fax patient out to [facility]  Jones Broom. [place], MSW, [place] 09/09/2015 5:19 PM

## 2015-09-09 NOTE — Clinical Social Work Placement (Signed)
   CLINICAL SOCIAL WORK PLACEMENT  NOTE  Date:  09/09/2015  Patient Details  Name: Jillian Bright MRN: [id number] Date of Birth: Nov 17, 1945  Clinical Social Work is seeking post-discharge placement for this patient at the [place] level of care (*CSW will initial, date and re-position this form in  chart as items are completed):  Yes   Patient/family provided with [place] Work Department's list of facilities offering this level of care within the geographic area requested by the patient (or if unable, by the patient's family).  Yes   Patient/family informed of their freedom to choose among providers that offer the needed level of care, that participate in Medicare, Medicaid or managed care program needed by the patient, have an available bed and are willing to accept the patient.  Yes   Patient/family informed of [facility]'s ownership interest in [facility] and [facility], as well as of the fact that they are under no obligation to receive care at these facilities.  PASRR submitted to EDS on 09/09/15     PASRR number received on       Existing PASRR number confirmed on 09/09/15     FL2 transmitted to all facilities in geographic area requested by pt/family on 09/09/15     FL2 transmitted to all facilities within larger geographic area on       Patient informed that his/her managed care company has contracts with or will negotiate with certain facilities, including the following:            Patient/family informed of bed offers received.  Patient chooses bed at       Physician recommends and patient chooses bed at      Patient to be transferred to   on  .  Patient to be transferred to facility by       Patient family notified on   of transfer.  Name of family member notified:        PHYSICIAN Please sign FL2, Please sign DNR     Additional Comment:    _______________________________________________ Ross Ludwig,  LCSWA 09/09/2015, 5:30 PM

## 2015-09-09 NOTE — Progress Notes (Signed)
Patient refused bed and chair alarm. Charge nurse notified. PT have seen the patient today.

## 2015-09-09 NOTE — Care Management Important Message (Signed)
Important Message  Patient Details  Name: Jillian Bright MRN: [id number] Date of Birth: Feb 19, 1946   Medicare Important Message Given:  Yes    Nathen May 09/09/2015, 11:12 AM

## 2015-09-09 NOTE — Progress Notes (Signed)
CM talked to patient about DCP, patient is agreeable to go to SNF short term for rehab. Sherrian Divers made aware. Mindi Slicker [facility] [phone]

## 2015-09-10 LAB — BASIC METABOLIC PANEL
ANION GAP: 12 (ref 5–15)
BUN: 26 mg/dL — AB (ref 6–20)
CALCIUM: 7.4 mg/dL — AB (ref 8.9–10.3)
CO2: 38 mmol/L — AB (ref 22–32)
CREATININE: 1.1 mg/dL — AB (ref 0.44–1.00)
Chloride: 87 mmol/L — ABNORMAL LOW (ref 101–111)
GFR calc non Af Amer: 49 mL/min — ABNORMAL LOW (ref >60)
GFR, EST AFRICAN AMERICAN: 57 mL/min — AB (ref >60)
Glucose, Bld: 250 mg/dL — ABNORMAL HIGH (ref 65–99)
Potassium: 3.7 mmol/L (ref 3.5–5.1)
SODIUM: 137 mmol/L (ref 135–145)

## 2015-09-10 LAB — GLUCOSE, CAPILLARY
Glucose-Capillary: 204 mg/dL — ABNORMAL HIGH (ref 65–99)
Glucose-Capillary: 193 mg/dL — ABNORMAL HIGH (ref 65–99)
Glucose-Capillary: 273 mg/dL — ABNORMAL HIGH (ref 65–99)
Glucose-Capillary: 147 mg/dL — ABNORMAL HIGH (ref 65–99)

## 2015-09-10 MED ORDER — POLYETHYLENE GLYCOL 3350 17 G PO PACK
17.0000 g | PACK | Freq: Every day | ORAL | Status: DC
  Administered 2015-09-10 – 2015-09-25 (×12): 17 g via ORAL
  Filled 2015-09-10 (×19): qty 1

## 2015-09-10 MED ORDER — BISACODYL 10 MG RE SUPP
10.0000 mg | Freq: Once | RECTAL | Status: AC
  Administered 2015-09-10: 10 mg via RECTAL
  Filled 2015-09-10: qty 1

## 2015-09-10 MED ORDER — ASPIRIN 81 MG PO CHEW
81.0000 mg | CHEWABLE_TABLET | Freq: Every day | ORAL | Status: DC
  Administered 2015-09-10 – 2015-09-18 (×9): 81 mg via ORAL
  Filled 2015-09-10 (×9): qty 1

## 2015-09-10 NOTE — Progress Notes (Signed)
PROGRESS NOTE    Jillian Bright  XTK:[id number] DOB: 26-May-1946 DOA: 09/07/2015 PCP: [name], MD  Outpatient Specialists:     Brief Narrative: 70 year old female with history of diabetes mellitus, COPD, sleep apnea, pulmonary hypertension, obesity, history of coronary artery disease, congestive heart failure, who presents to the ER with worsening bilateral lower extremity swelling and shortness of breath with orthopnea.Patient is known history of COPD Oxygen ,3 liters at baseline as well as history of diastolic heart failure last echogram was in 2015 showing EF of 99%-24 diastolic,chronic 1 diastolic heart failure and moderate aortic stenosis   Assessment & Plan:   Active Problems:   Essential hypertension   COPD with emphysema (HCC)   CAD (coronary artery disease)   DM type 2 (diabetes mellitus, type 2) (HCC)   Acute on chronic diastolic CHF (congestive heart failure), NYHA class 1 (HCC)   On home oxygen therapy   Microcytic anemia   CHF (congestive heart failure) (HCC)   CHF exacerbation (HCC)   Abnormal chest x-ray   Acute on chronic congestive heart failure ([place])   . Acute on chronic diastolic CHF (congestive heart failure), NYHA class 1 (HCC) - also likely secondary to noncompliance  Continue telemetry, cycle cardiac enzymes, obtain serial ECG monitor daily weight Continue to diurese with IV lasix  Repeat 2-D echo with EF 60-65% with mod-severe AS  Cont to hold off on ACE/ARBi due to renal insufficiency  Wt today 92.8kg<-93.6kg yesterday   . CAD (coronary artery disease) chronic continue home medications and cycle cardiac enzymes  . Essential hypertension chronic continue home medications   . Microcytic anemia possibly slow GI blood loss was obtain anemia panel Hemoccult stool type and screen transfuse for hemoglobin below 7 or if significantly symptomatic. Given IV iron . Patient will need ferrous sulfate on discharge and GI evaluation as  outpatient  . COPD with emphysema ([place]) stable continue home oxygen and inhalers  Diabetes mellitus hold by mouth medications order sliding scale, cont low-dose Lantus  Leg edema suspect likely secondary to heart failure. LE dopplers neg for DVT  Constipation: Reports no BM in one week. Will give trial of cathartics  DVT prophylaxis: SCD's Code Status:DNR Family Communication: Patient in room Disposition Plan: Uncertain at this time, possible home in 48-72hrs   Consultants:     Procedures:     Antimicrobials:    Subjective: Complains of feeling constipated   Objective: Filed Vitals:   09/09/15 2044 09/09/15 2047 09/10/15 0559 09/10/15 1236  BP: 94/57  106/66 95/67  Pulse: 108  82 88  Temp: 98.7 F (37.1 C)  97.4 F (36.3 C) 99.3 F (37.4 C)  TempSrc: Oral  Oral Oral  Resp: '18  18 18  '$ Height:      Weight:   90.629 kg (199 lb 12.8 oz)   SpO2: 97% 98% 99% 96%    Intake/Output Summary (Last 24 hours) at 09/10/15 1551 Last data filed at 09/10/15 1300  Gross per 24 hour  Intake    806 ml  Output   3100 ml  Net  -2294 ml   Filed Weights   09/08/15 0500 09/09/15 0448 09/10/15 0559  Weight: 93.66 kg (206 lb 7.7 oz) 92.806 kg (204 lb 9.6 oz) 90.629 kg (199 lb 12.8 oz)    Examination:  General exam: Appears calm and comfortable, sitting in chair  Respiratory system: Clear to auscultation. Respiratory effort normal. Cardiovascular system: S1 & S2 heard, RRR. No JVD, murmurs, rubs, gallops or clicks.  Gastrointestinal system: Abdomen is nnontender. No organomegaly or masses felt. Normal bowel sounds heard. Central nervous system: Alert and oriented. No focal neurological deficits. Extremities: Symmetric 5 x 5 power. B LE pitting edema Skin: No rashes, lesions or ulcers Psychiatry: Judgement and insight appear normal. Mood & affect appropriate.     Data Reviewed: I have personally reviewed following labs and imaging studies  CBC:  Recent Labs Lab  09/07/15 1819 09/07/15 2000 09/09/15 0359  WBC  --  6.7 5.3  NEUTROABS  --  4.1  --   HGB 10.5* 8.0* 7.2*  HCT 31.0* 30.4* 29.3*  MCV  --  72.4* 73.3*  PLT  --  194 478   Basic Metabolic Panel:  Recent Labs Lab 09/07/15 1819 09/07/15 2000 09/08/15 0713 09/09/15 0359 09/10/15 0250  NA 135 137 139 139 137  K 4.9 4.9 4.5 5.0 3.7  CL 92* 94* 96* 91* 87*  CO2  --  30 33* 37* 38*  GLUCOSE 205* 168* 173* 234* 250*  BUN 31* 29* 29* 29* 26*  CREATININE 1.40* 1.41* 1.50* 1.41* 1.10*  CALCIUM  --  8.7* 8.4* 8.3* 7.4*   GFR: Estimated Creatinine Clearance: 53.2 mL/min (by C-G formula based on Cr of 1.1). Liver Function Tests:  Recent Labs Lab 09/09/15 0359  AST 28  ALT 24  ALKPHOS 54  BILITOT 1.1  PROT 6.6  ALBUMIN 3.5   No results for input(s): LIPASE, AMYLASE in the last 168 hours. No results for input(s): AMMONIA in the last 168 hours. Coagulation Profile: No results for input(s): INR, PROTIME in the last 168 hours. Cardiac Enzymes:  Recent Labs Lab 09/07/15 2000 09/08/15 0029 09/08/15 0713 09/08/15 1250  TROPONINI 0.04* 0.04* 0.04* 0.04*   BNP (last 3 results) No results for input(s): PROBNP in the last 8760 hours. HbA1C:  Recent Labs  09/08/15 0713  HGBA1C 7.3*   CBG:  Recent Labs Lab 09/09/15 1203 09/09/15 1757 09/09/15 2141 09/10/15 0606 09/10/15 1223  GLUCAP 182* 150* 223* 204* 193*   Lipid Profile: No results for input(s): CHOL, HDL, LDLCALC, TRIG, CHOLHDL, LDLDIRECT in the last 72 hours. Thyroid Function Tests: No results for input(s): TSH, T4TOTAL, FREET4, T3FREE, THYROIDAB in the last 72 hours. Anemia Panel:  Recent Labs  09/08/15 0713  VITAMINB12 414  FOLATE 18.2  FERRITIN 8*  TIBC 465*  IRON 10*  RETICCTPCT 2.7   Urine analysis:    Component Value Date/Time   COLORURINE YELLOW 09/08/2015 0137   APPEARANCEUR CLEAR 09/08/2015 0137   LABSPEC 1.009 09/08/2015 0137   PHURINE 5.0 09/08/2015 0137   GLUCOSEU NEGATIVE  09/08/2015 0137   HGBUR NEGATIVE 09/08/2015 0137   HGBUR negative 08/04/2008 1526   BILIRUBINUR NEGATIVE 09/08/2015 0137   BILIRUBINUR n 11/24/2012 1335   KETONESUR NEGATIVE 09/08/2015 0137   PROTEINUR NEGATIVE 09/08/2015 0137   PROTEINUR trace 11/24/2012 1335   UROBILINOGEN 0.2 11/18/2014 0750   UROBILINOGEN 0.2 11/24/2012 1335   NITRITE NEGATIVE 09/08/2015 0137   NITRITE n 11/24/2012 1335   LEUKOCYTESUR NEGATIVE 09/08/2015 0137   Sepsis Labs: '@LABRCNTIP'$ (procalcitonin:4,lacticidven:4)  )No results found for this or any previous visit (from the past 240 hour(s)).       Radiology Studies: No results found.      Scheduled Meds: . aspirin  81 mg Oral Daily  . atorvastatin  20 mg Oral Daily  . darifenacin  7.5 mg Oral Daily  . DULoxetine  60 mg Oral Daily  . furosemide  60 mg Intravenous BID  .  gabapentin  300 mg Oral TID  . insulin aspart  0-5 Units Subcutaneous QHS  . insulin aspart  0-9 Units Subcutaneous TID WC  . mometasone-formoterol  2 puff Inhalation BID  . pantoprazole  40 mg Oral Daily  . polyethylene glycol  17 g Oral Daily  . sodium chloride flush  3 mL Intravenous Q12H  . spironolactone  25 mg Oral q morning - 10a  . traZODone  50 mg Oral QHS   Continuous Infusions:    LOS: 3 days      Jarick Harkins, Orpah Melter, MD [facility] Pager [phone]  If 7PM-7AM, please contact night-coverage [email or web address] Password [facility] 09/10/2015, 3:51 PM

## 2015-09-11 LAB — BASIC METABOLIC PANEL
ANION GAP: 10 (ref 5–15)
BUN: 24 mg/dL — AB (ref 6–20)
CO2: 42 mmol/L — AB (ref 22–32)
Calcium: 6.9 mg/dL — ABNORMAL LOW (ref 8.9–10.3)
Chloride: 84 mmol/L — ABNORMAL LOW (ref 101–111)
Creatinine, Ser: 1.28 mg/dL — ABNORMAL HIGH (ref 0.44–1.00)
GFR calc Af Amer: 48 mL/min — ABNORMAL LOW (ref >60)
GFR calc non Af Amer: 41 mL/min — ABNORMAL LOW (ref >60)
GLUCOSE: 212 mg/dL — AB (ref 65–99)
POTASSIUM: 4 mmol/L (ref 3.5–5.1)
Sodium: 136 mmol/L (ref 135–145)

## 2015-09-11 LAB — GLUCOSE, CAPILLARY
GLUCOSE-CAPILLARY: 270 mg/dL — AB (ref 65–99)
Glucose-Capillary: 186 mg/dL — ABNORMAL HIGH (ref 65–99)
Glucose-Capillary: 231 mg/dL — ABNORMAL HIGH (ref 65–99)
Glucose-Capillary: 194 mg/dL — ABNORMAL HIGH (ref 65–99)

## 2015-09-11 LAB — CBC
HEMATOCRIT: 27.5 % — AB (ref 36.0–46.0)
HEMOGLOBIN: 6.8 g/dL — AB (ref 12.0–15.0)
MCH: 18 pg — AB (ref 26.0–34.0)
MCHC: 24.7 g/dL — ABNORMAL LOW (ref 30.0–36.0)
MCV: 72.8 fL — AB (ref 78.0–100.0)
Platelets: 148 10*3/uL — ABNORMAL LOW (ref 150–400)
RBC: 3.78 MIL/uL — AB (ref 3.87–5.11)
RDW: 18.5 % — ABNORMAL HIGH (ref 11.5–15.5)
WBC: 6.1 10*3/uL (ref 4.0–10.5)

## 2015-09-11 LAB — PREPARE RBC (CROSSMATCH)

## 2015-09-11 MED ORDER — FUROSEMIDE 10 MG/ML IJ SOLN
20.0000 mg | Freq: Once | INTRAMUSCULAR | Status: AC
Start: 2015-09-11 — End: 2015-09-11
  Administered 2015-09-11: 20 mg via INTRAVENOUS
  Filled 2015-09-11: qty 2

## 2015-09-11 MED ORDER — FUROSEMIDE 10 MG/ML IJ SOLN
20.0000 mg | Freq: Once | INTRAMUSCULAR | Status: AC
  Administered 2015-09-11: 20 mg via INTRAVENOUS
  Filled 2015-09-11: qty 2

## 2015-09-11 MED ORDER — SODIUM CHLORIDE 0.9 % IV SOLN
Freq: Once | INTRAVENOUS | Status: AC
  Administered 2015-09-11: 08:00:00 via INTRAVENOUS

## 2015-09-11 MED ORDER — SODIUM CHLORIDE 0.9 % IV SOLN: Freq: Once | INTRAVENOUS | Status: DC

## 2015-09-11 NOTE — Progress Notes (Signed)
PROGRESS NOTE    ARLEY GARANT  ZOX:[id number] DOB: Nov 17, 1945 DOA: 09/07/2015 PCP: [name], MD  Outpatient Specialists:     Brief Narrative: 70 year old female with history of diabetes mellitus, COPD, sleep apnea, pulmonary hypertension, obesity, history of coronary artery disease, congestive heart failure, who presents to the ER with worsening bilateral lower extremity swelling and shortness of breath with orthopnea.Patient is known history of COPD Oxygen ,3 liters at baseline as well as history of diastolic heart failure last echogram was in 2015 showing EF of 81%-19 diastolic,chronic 1 diastolic heart failure and moderate aortic stenosis   Assessment & Plan:   Active Problems:   Essential hypertension   COPD with emphysema (HCC)   CAD (coronary artery disease)   DM type 2 (diabetes mellitus, type 2) (HCC)   Acute on chronic diastolic CHF (congestive heart failure), NYHA class 1 (HCC)   On home oxygen therapy   Microcytic anemia   CHF (congestive heart failure) (HCC)   CHF exacerbation (HCC)   Abnormal chest x-ray   Acute on chronic congestive heart failure ([place])   . Acute on chronic diastolic CHF (congestive heart failure), NYHA class 1 (HCC) - also likely secondary to noncompliance - patient admitted to not taking lasix because she did not feel she needed it Continue telemetry Serial trop remained stable at 0.04. Suspect troponin leak in the setting of acute CHF Continue to diurese with IV lasix  Repeat 2-D echo with EF 60-65% with mod-severe AS  Cont to hold off on ACE/ARBi due to renal insufficiency  Wt today 89.6kg<-90.6kg yesterday   . CAD (coronary artery disease) chronic continue home medications and cycle cardiac enzymes  . Essential hypertension chronic continue home medications   . Microcytic anemia possibly slow GI blood loss was obtain anemia panel Hemoccult stool type and screen transfuse for hemoglobin below 7 or if significantly symptomatic.  Given IV iron . Patient will need ferrous sulfate on discharge and GI evaluation as outpatient  . COPD with emphysema ([place]) stable continue home oxygen and inhalers  Diabetes mellitus hold by mouth medications order sliding scale, cont low-dose Lantus  Leg edema suspect likely secondary to heart failure. LE dopplers neg for DVT  Constipation: Reports no BM for one week. This AM, reported large BM with cathartics.  DVT prophylaxis: SCD's Code Status:DNR Family Communication: Patient in room Disposition Plan: Uncertain at this time, possible home in 48-72hrs   Consultants:     Procedures:     Antimicrobials:    Subjective: Reports feeling somewhat better today. Legs still swollen  Objective: Filed Vitals:   09/11/15 0758 09/11/15 1016 09/11/15 1217 09/11/15 1230  BP: 113/72 1'04/65 96/68 96/59 '$  Pulse: 106 108 103 101  Temp: 97.8 F (36.6 C) 97.9 F (36.6 C) 98 F (36.7 C) 98.4 F (36.9 C)  TempSrc: Oral Oral Oral Oral  Resp: '16 18  18  '$ Height:      Weight:      SpO2: 97% 95% 100% 100%    Intake/Output Summary (Last 24 hours) at 09/11/15 1555 Last data filed at 09/11/15 1144  Gross per 24 hour  Intake    808 ml  Output   1550 ml  Net   -742 ml   Filed Weights   09/09/15 0448 09/10/15 0559 09/11/15 0437  Weight: 92.806 kg (204 lb 9.6 oz) 90.629 kg (199 lb 12.8 oz) 89.676 kg (197 lb 11.2 oz)    Examination:  General exam: Appears calm and comfortable, sitting in chair  Respiratory system: Clear to auscultation. Respiratory effort normal. Cardiovascular system: S1 & S2 heard, RRR. No JVD, murmurs, rubs, gallops or clicks.  Gastrointestinal system: Abdomen is nontender. No organomegaly or masses felt. Posl bowel sounds Central nervous system: Alert and oriented. No focal neurological deficits. Extremities: Symmetric 5 x 5 power. B LE pitting edema, slowly improving Skin: No rashes, lesions or ulcers Psychiatry: Judgement and insight appear normal. Mood &  affect appropriate.     Data Reviewed: I have personally reviewed following labs and imaging studies  CBC:  Recent Labs Lab 09/07/15 1819 09/07/15 2000 09/09/15 0359 09/11/15 0325  WBC  --  6.7 5.3 6.1  NEUTROABS  --  4.1  --   --   HGB 10.5* 8.0* 7.2* 6.8*  HCT 31.0* 30.4* 29.3* 27.5*  MCV  --  72.4* 73.3* 72.8*  PLT  --  194 162 017*   Basic Metabolic Panel:  Recent Labs Lab 09/07/15 2000 09/08/15 0713 09/09/15 0359 09/10/15 0250 09/11/15 0325  NA 137 139 139 137 136  K 4.9 4.5 5.0 3.7 4.0  CL 94* 96* 91* 87* 84*  CO2 30 33* 37* 38* 42*  GLUCOSE 168* 173* 234* 250* 212*  BUN 29* 29* 29* 26* 24*  CREATININE 1.41* 1.50* 1.41* 1.10* 1.28*  CALCIUM 8.7* 8.4* 8.3* 7.4* 6.9*   GFR: Estimated Creatinine Clearance: 45.5 mL/min (by C-G formula based on Cr of 1.28). Liver Function Tests:  Recent Labs Lab 09/09/15 0359  AST 28  ALT 24  ALKPHOS 54  BILITOT 1.1  PROT 6.6  ALBUMIN 3.5   No results for input(s): LIPASE, AMYLASE in the last 168 hours. No results for input(s): AMMONIA in the last 168 hours. Coagulation Profile: No results for input(s): INR, PROTIME in the last 168 hours. Cardiac Enzymes:  Recent Labs Lab 09/07/15 2000 09/08/15 0029 09/08/15 0713 09/08/15 1250  TROPONINI 0.04* 0.04* 0.04* 0.04*   BNP (last 3 results) No results for input(s): PROBNP in the last 8760 hours. HbA1C: No results for input(s): HGBA1C in the last 72 hours. CBG:  Recent Labs Lab 09/10/15 1223 09/10/15 1628 09/10/15 2028 09/11/15 0554 09/11/15 1259  GLUCAP 193* 273* 147* 186* 231*   Lipid Profile: No results for input(s): CHOL, HDL, LDLCALC, TRIG, CHOLHDL, LDLDIRECT in the last 72 hours. Thyroid Function Tests: No results for input(s): TSH, T4TOTAL, FREET4, T3FREE, THYROIDAB in the last 72 hours. Anemia Panel: No results for input(s): VITAMINB12, FOLATE, FERRITIN, TIBC, IRON, RETICCTPCT in the last 72 hours. Urine analysis:    Component Value Date/Time     COLORURINE YELLOW 09/08/2015 [place] 09/08/2015 0137   LABSPEC 1.009 09/08/2015 0137   PHURINE 5.0 09/08/2015 0137   GLUCOSEU NEGATIVE 09/08/2015 0137   HGBUR NEGATIVE 09/08/2015 0137   HGBUR negative 08/04/2008 1526   BILIRUBINUR NEGATIVE 09/08/2015 0137   BILIRUBINUR n 11/24/2012 1335   KETONESUR NEGATIVE 09/08/2015 0137   PROTEINUR NEGATIVE 09/08/2015 0137   PROTEINUR trace 11/24/2012 1335   UROBILINOGEN 0.2 11/18/2014 0750   UROBILINOGEN 0.2 11/24/2012 1335   NITRITE NEGATIVE 09/08/2015 0137   NITRITE n 11/24/2012 1335   LEUKOCYTESUR NEGATIVE 09/08/2015 0137   Sepsis Labs: '@LABRCNTIP'$ (procalcitonin:4,lacticidven:4)  )No results found for this or any previous visit (from the past 240 hour(s)).       Radiology Studies: No results found.      Scheduled Meds: . sodium chloride   Intravenous Once  . aspirin  81 mg Oral Daily  . atorvastatin  20 mg Oral  Daily  . darifenacin  7.5 mg Oral Daily  . DULoxetine  60 mg Oral Daily  . furosemide  20 mg Intravenous Once  . furosemide  60 mg Intravenous BID  . gabapentin  300 mg Oral TID  . insulin aspart  0-5 Units Subcutaneous QHS  . insulin aspart  0-9 Units Subcutaneous TID WC  . mometasone-formoterol  2 puff Inhalation BID  . pantoprazole  40 mg Oral Daily  . polyethylene glycol  17 g Oral Daily  . sodium chloride flush  3 mL Intravenous Q12H  . spironolactone  25 mg Oral q morning - 10a  . traZODone  50 mg Oral QHS   Continuous Infusions:    LOS: 4 days      Katelynne Revak, Orpah Melter, MD [facility] Pager [phone]  If 7PM-7AM, please contact night-coverage [email or web address] Password [facility] 09/11/2015, 3:55 PM

## 2015-09-11 NOTE — Progress Notes (Signed)
Hgb 6.8, MD notified and orders are put in, will continue to monitor, Thanks Arvella Nigh RN

## 2015-09-12 DIAGNOSIS — I10 Essential (primary) hypertension: Secondary | ICD-10-CM

## 2015-09-12 DIAGNOSIS — I2510 Atherosclerotic heart disease of native coronary artery without angina pectoris: Secondary | ICD-10-CM

## 2015-09-12 LAB — BASIC METABOLIC PANEL
ANION GAP: 12 (ref 5–15)
BUN: 27 mg/dL — ABNORMAL HIGH (ref 6–20)
CHLORIDE: 81 mmol/L — AB (ref 101–111)
CO2: 41 mmol/L — AB (ref 22–32)
Calcium: 7 mg/dL — ABNORMAL LOW (ref 8.9–10.3)
Creatinine, Ser: 1.25 mg/dL — ABNORMAL HIGH (ref 0.44–1.00)
GFR calc non Af Amer: 42 mL/min — ABNORMAL LOW (ref >60)
GFR, EST AFRICAN AMERICAN: 49 mL/min — AB (ref >60)
Glucose, Bld: 220 mg/dL — ABNORMAL HIGH (ref 65–99)
POTASSIUM: 4.1 mmol/L (ref 3.5–5.1)
SODIUM: 134 mmol/L — AB (ref 135–145)

## 2015-09-12 LAB — GLUCOSE, CAPILLARY
GLUCOSE-CAPILLARY: 211 mg/dL — AB (ref 65–99)
GLUCOSE-CAPILLARY: 212 mg/dL — AB (ref 65–99)
Glucose-Capillary: 211 mg/dL — ABNORMAL HIGH (ref 65–99)
Glucose-Capillary: 273 mg/dL — ABNORMAL HIGH (ref 65–99)

## 2015-09-12 LAB — CBC
HEMATOCRIT: 33.5 % — AB (ref 36.0–46.0)
HEMOGLOBIN: 8.9 g/dL — AB (ref 12.0–15.0)
MCH: 20.4 pg — AB (ref 26.0–34.0)
MCHC: 26.6 g/dL — ABNORMAL LOW (ref 30.0–36.0)
MCV: 76.7 fL — ABNORMAL LOW (ref 78.0–100.0)
Platelets: 134 10*3/uL — ABNORMAL LOW (ref 150–400)
RBC: 4.37 MIL/uL (ref 3.87–5.11)
RDW: 19.7 % — ABNORMAL HIGH (ref 11.5–15.5)
WBC: 6.2 10*3/uL (ref 4.0–10.5)

## 2015-09-12 NOTE — Progress Notes (Signed)
PROGRESS NOTE    Jillian Bright  VZC:[id number] DOB: Oct 13, 1945 DOA: 09/07/2015 PCP: [name], MD  Outpatient Specialists:     Brief Narrative: 70 year old female with history of diabetes mellitus, COPD, sleep apnea, pulmonary hypertension, obesity, history of coronary artery disease, congestive heart failure, who presents to the ER with worsening bilateral lower extremity swelling and shortness of breath with orthopnea.Patient is known history of COPD Oxygen ,3 liters at baseline as well as history of diastolic heart failure last echogram was in 2015 showing EF of 12%-87 diastolic,chronic 1 diastolic heart failure and moderate aortic stenosis   Assessment & Plan:   Active Problems:   Essential hypertension   COPD with emphysema (HCC)   CAD (coronary artery disease)   DM type 2 (diabetes mellitus, type 2) (HCC)   Acute on chronic diastolic CHF (congestive heart failure), NYHA class 1 (HCC)   On home oxygen therapy   Microcytic anemia   CHF (congestive heart failure) (HCC)   CHF exacerbation (HCC)   Abnormal chest x-ray   Acute on chronic congestive heart failure ([place])   . Acute on chronic diastolic CHF (congestive heart failure), NYHA class 1 (HCC) - also likely secondary to noncompliance - patient admitted to not taking lasix because she did not feel she needed it Continue telemetry Serial trop remained had stable at 0.04. Suspect troponin leak in the setting of acute CHF Had clinically improved with IV lasix over the weekend Repeat 2-D echo with EF 60-65% with mod-severe AS  Cont to hold off on ACE/ARBi due to renal insufficiency  Wt today 90.8kg<-89.6kg yesterday   Net neg 9.5L since admit  This AM, noted to be hypotensive. Hold further diuresis. Consulted CT surg regarding AS per below.  Cardiology consulted for assistance  Severe AS  Valve area <1cm  Consulted CT Surgery  Will also consult Cardiology for assistance  . CAD (coronary artery disease) chronic  continue home medications  . Essential hypertension chronic continue home medications   . Microcytic anemia Plan to cont to transfuse for hemoglobin below 7 or if significantly symptomatic. Given IV iron. Pt is iron deficient . Patient will need ferrous sulfate on discharge and possible GI evaluation as outpatient - Hgb down to 6.9 on 4/16, appropriately responded to 2 units of PRBC's  . COPD with emphysema ([place]) stable continue home oxygen and inhalers  Diabetes mellitus hold by mouth medications order sliding scale, cont low-dose Lantus  Leg edema suspect likely secondary to heart failure. LE dopplers neg for DVT  Constipation: Reports no BM for one week. Pt now having BM with cathartics.  DVT prophylaxis: SCD's Code Status:DNR Family Communication: Patient in room Disposition Plan: Uncertain at this time, possible home in 48-72hrs   Consultants:   CT surgery  Cardiology  Procedures:     Antimicrobials:    Subjective: Reported feeling light headed and dizzy on standing this AM. Noted to be hypotensive  Objective: Filed Vitals:   09/12/15 1057 09/12/15 1137 09/12/15 1209 09/12/15 1221  BP: 105/78 76/62  101/70  Pulse: 104 104    Temp:  98.4 F (36.9 C)    TempSrc:  Oral    Resp: 18 18    Height:      Weight:      SpO2: 95% 91% 92%     Intake/Output Summary (Last 24 hours) at 09/12/15 1633 Last data filed at 09/12/15 1300  Gross per 24 hour  Intake    900 ml  Output   1550  ml  Net   -650 ml   Filed Weights   09/10/15 0559 09/11/15 0437 09/12/15 0558  Weight: 90.629 kg (199 lb 12.8 oz) 89.676 kg (197 lb 11.2 oz) 90.855 kg (200 lb 4.8 oz)    Examination:  General exam: Appears calm and comfortable, sitting in chair  Respiratory system: Clear to auscultation. Respiratory effort normal. Cardiovascular system: S1 & S2 heard, RRR. No JVD, murmurs, rubs, gallops or clicks.  Gastrointestinal system: Abdomen is nontender. No organomegaly or masses felt.  Posl bowel sounds Central nervous system: Alert and oriented. No focal neurological deficits. Extremities: Symmetric 5 x 5 power. B LE pitting edema, slowly improving Skin: No rashes, lesions or ulcers Psychiatry: Judgement and insight appear normal. Mood & affect appropriate.     Data Reviewed: I have personally reviewed following labs and imaging studies  CBC:  Recent Labs Lab 09/07/15 1819 09/07/15 2000 09/09/15 0359 09/11/15 0325 09/12/15 0302  WBC  --  6.7 5.3 6.1 6.2  NEUTROABS  --  4.1  --   --   --   HGB 10.5* 8.0* 7.2* 6.8* 8.9*  HCT 31.0* 30.4* 29.3* 27.5* 33.5*  MCV  --  72.4* 73.3* 72.8* 76.7*  PLT  --  194 162 148* 762*   Basic Metabolic Panel:  Recent Labs Lab 09/08/15 0713 09/09/15 0359 09/10/15 0250 09/11/15 0325 09/12/15 0302  NA 139 139 137 136 134*  K 4.5 5.0 3.7 4.0 4.1  CL 96* 91* 87* 84* 81*  CO2 33* 37* 38* 42* 41*  GLUCOSE 173* 234* 250* 212* 220*  BUN 29* 29* 26* 24* 27*  CREATININE 1.50* 1.41* 1.10* 1.28* 1.25*  CALCIUM 8.4* 8.3* 7.4* 6.9* 7.0*   GFR: Estimated Creatinine Clearance: 46.9 mL/min (by C-G formula based on Cr of 1.25). Liver Function Tests:  Recent Labs Lab 09/09/15 0359  AST 28  ALT 24  ALKPHOS 54  BILITOT 1.1  PROT 6.6  ALBUMIN 3.5   No results for input(s): LIPASE, AMYLASE in the last 168 hours. No results for input(s): AMMONIA in the last 168 hours. Coagulation Profile: No results for input(s): INR, PROTIME in the last 168 hours. Cardiac Enzymes:  Recent Labs Lab 09/07/15 2000 09/08/15 0029 09/08/15 0713 09/08/15 1250  TROPONINI 0.04* 0.04* 0.04* 0.04*   BNP (last 3 results) No results for input(s): PROBNP in the last 8760 hours. HbA1C: No results for input(s): HGBA1C in the last 72 hours. CBG:  Recent Labs Lab 09/11/15 1259 09/11/15 1737 09/11/15 2122 09/12/15 0657 09/12/15 1220  GLUCAP 231* 270* 194* 211* 211*   Lipid Profile: No results for input(s): CHOL, HDL, LDLCALC, TRIG, CHOLHDL,  LDLDIRECT in the last 72 hours. Thyroid Function Tests: No results for input(s): TSH, T4TOTAL, FREET4, T3FREE, THYROIDAB in the last 72 hours. Anemia Panel: No results for input(s): VITAMINB12, FOLATE, FERRITIN, TIBC, IRON, RETICCTPCT in the last 72 hours. Urine analysis:    Component Value Date/Time   COLORURINE YELLOW 09/08/2015 [place] 09/08/2015 0137   LABSPEC 1.009 09/08/2015 0137   PHURINE 5.0 09/08/2015 0137   GLUCOSEU NEGATIVE 09/08/2015 0137   HGBUR NEGATIVE 09/08/2015 0137   HGBUR negative 08/04/2008 1526   BILIRUBINUR NEGATIVE 09/08/2015 0137   BILIRUBINUR n 11/24/2012 1335   KETONESUR NEGATIVE 09/08/2015 0137   PROTEINUR NEGATIVE 09/08/2015 0137   PROTEINUR trace 11/24/2012 1335   UROBILINOGEN 0.2 11/18/2014 0750   UROBILINOGEN 0.2 11/24/2012 1335   NITRITE NEGATIVE 09/08/2015 0137   NITRITE n 11/24/2012 1335   LEUKOCYTESUR  NEGATIVE 09/08/2015 0137   Sepsis Labs: '@LABRCNTIP'$ (procalcitonin:4,lacticidven:4)  )No results found for this or any previous visit (from the past 240 hour(s)).       Radiology Studies: No results found.      Scheduled Meds: . sodium chloride   Intravenous Once  . aspirin  81 mg Oral Daily  . atorvastatin  20 mg Oral Daily  . darifenacin  7.5 mg Oral Daily  . DULoxetine  60 mg Oral Daily  . gabapentin  300 mg Oral TID  . insulin aspart  0-5 Units Subcutaneous QHS  . insulin aspart  0-9 Units Subcutaneous TID WC  . mometasone-formoterol  2 puff Inhalation BID  . pantoprazole  40 mg Oral Daily  . polyethylene glycol  17 g Oral Daily  . sodium chloride flush  3 mL Intravenous Q12H  . traZODone  50 mg Oral QHS   Continuous Infusions:    LOS: 5 days    CHIU, Orpah Melter, MD [facility] Pager [phone]  If 7PM-7AM, please contact night-coverage [email or web address] Password [facility] 09/12/2015, 4:33 PM

## 2015-09-12 NOTE — Progress Notes (Signed)
Inpatient Diabetes Program Recommendations  AACE/ADA: New Consensus Statement on Inpatient Glycemic Control (2015)  Target Ranges:  Prepandial:   less than 140 mg/dL      Peak postprandial:   less than 180 mg/dL (1-2 hours)      Critically ill patients:  140 - 180 mg/dL  Results for SKILER, TYE (MRN [id number]) as of 09/12/2015 11:41  Ref. Range 09/11/2015 05:54 09/11/2015 12:59 09/11/2015 17:37 09/11/2015 21:22 09/12/2015 06:57  Glucose-Capillary Latest Ref Range: 65-99 mg/dL 186 (H) 231 (H) 270 (H) 194 (H) 211 (H)   Review of Glycemic Control   Current orders for Inpatient glycemic control: Novolog 0-9 units TID with meals, Novolog 0-5 units QHS  Inpatient Diabetes Program Recommendations: Insulin - Meal Coverage: Post prandial glucose is consistently elevated. Please consider ordering Novolog 3 units TID with meals for meal coverage.  Thanks, Barnie Alderman, RN, MSN, CDE Diabetes Coordinator Inpatient Diabetes Program [phone] (Team Pager from [place] to [place]) [phone] ([facility] office) [phone] [facility] office) [phone] [facility] office)

## 2015-09-12 NOTE — Consult Note (Signed)
Cardiologist:  Aundra Dubin.  Has not been seen by him since August 2013 Reason for Consult:  CHF, mod to severe AS.  Referring Physician: Kainat Pizana is an 70 y.o. female.  HPI:   Patient 70 year old female history of coronary artery disease, congestive heart failure, diabetes mellitus, oxygen dependent COPD, obstructive sleep apnea-intolerant to CPAP, pulmonary hypertension, obesity, hyperlipidemia.  She was last seen by Dr. Aundra Dubin  in August 2013.  She initially presented with peripheral edema in 2009 and had a normal stress test and preserved EF on echocardiogram. She had a right heart catheterization which showed mild/moderate pulmonary hypertension which responded well to oxygen with decreased PA pressure. She had 2-D echocardiogram on 09/08/2015 revealing normal ejection fraction of 60-65% and wall motion. Grade 1 diastolic dysfunction. Moderate to severe aortic stenosis where previously was moderate. Mild MR. Left atrium mildly dilated. Peak PA pressure 61 mmHg. Right atrium moderately to severely dilated.  Moderate tricuspid valve regurgitation.   The patient reports worsening lower extremity edema over a long period of time.  She also has significant pain in her legs when she walks along with redness.  Her abdomen is distended and tight.     Past Medical History  Diagnosis Date  . Diabetes mellitus   . COPD (chronic obstructive pulmonary disease) ([place])     sees Dr. [name]  . Sleep apnea, obstructive   . Pulmonary HTN ([place])   . Obesity (BMI 30.0-34.9)   . Hyperlipidemia   . Myocardial infarction ([place])   . Coronary artery disease   . Anxiety   . Shortness of breath   . GERD (gastroesophageal reflux disease)   . Arthritis     rheumatoid, sees Dr. Tobie Lords  . CHF (congestive heart failure) ([place])   . Pneumonia   . Bronchitis     hx of   . History of hiatal hernia     Past Surgical History  Procedure Laterality Date  . Appendectomy    . Tonsillectomy and  adenoidectomy    . Lumbar disc surgery    . Hemorrhoid surgery    . Coronary angioplasty    . Endarterectomy Left 12/04/2012    Procedure: ENDARTERECTOMY CAROTID;  Surgeon: Mal Misty, MD;  Location: [facility];  Service: Vascular;  Laterality: Left;  . Patch angioplasty Left 12/04/2012    Procedure: PATCH ANGIOPLASTY;  Surgeon: Mal Misty, MD;  Location: [facility];  Service: Vascular;  Laterality: Left;  Marland Kitchen Eye surgery      bilat cataract surg   . Colonscopy     . Total hip arthroplasty Left 07/30/2014    Procedure: LEFT TOTAL HIP ARTHROPLASTY ANTERIOR APPROACH;  Surgeon: Meredith Pel, MD;  Location: [facility];  Service: Orthopedics;  Laterality: Left;    Family History  Problem Relation Age of Onset  . Hypertension Mother   . Hyperlipidemia Mother   . Stroke Mother   . Heart attack Mother   . Cancer Mother   . Heart attack Father   . Cancer Brother   . Asthma Maternal Grandfather     Social History:  reports that she quit smoking about 2 years ago. Her smoking use included Cigarettes. She has a 72 pack-year smoking history. She has never used smokeless tobacco. She reports that she drinks alcohol. She reports that she does not use illicit drugs.  Allergies:  Allergies  Allergen Reactions  . Codeine Itching    Takes vicodin at home  Medications:  Scheduled Meds: . sodium chloride   Intravenous Once  . aspirin  81 mg Oral Daily  . atorvastatin  20 mg Oral Daily  . darifenacin  7.5 mg Oral Daily  . DULoxetine  60 mg Oral Daily  . gabapentin  300 mg Oral TID  . insulin aspart  0-5 Units Subcutaneous QHS  . insulin aspart  0-9 Units Subcutaneous TID WC  . mometasone-formoterol  2 puff Inhalation BID  . pantoprazole  40 mg Oral Daily  . polyethylene glycol  17 g Oral Daily  . sodium chloride flush  3 mL Intravenous Q12H  . traZODone  50 mg Oral QHS   Continuous Infusions:  PRN Meds:.sodium chloride, acetaminophen, ALPRAZolam, ondansetron (ZOFRAN) IV, sodium chloride  flush, traMADol   Results for orders placed or performed during the hospital encounter of 09/07/15 (from the past 48 hour(s))  Glucose, capillary     Status: Abnormal   Collection Time: 09/10/15  4:28 PM  Result Value Ref Range   Glucose-Capillary 273 (H) 65 - 99 mg/dL  Glucose, capillary     Status: Abnormal   Collection Time: 09/10/15  8:28 PM  Result Value Ref Range   Glucose-Capillary 147 (H) 65 - 99 mg/dL  CBC     Status: Abnormal   Collection Time: 09/11/15  3:25 AM  Result Value Ref Range   WBC 6.1 4.0 - 10.5 K/uL   RBC 3.78 (L) 3.87 - 5.11 MIL/uL   Hemoglobin 6.8 (LL) 12.0 - 15.0 g/dL    Comment: REPEATED TO VERIFY CRITICAL RESULT CALLED TO, READ BACK BY AND VERIFIED WITH: M.FUTRELL,RN [id number] 09/11/15 M.CAMPBELL    HCT 27.5 (L) 36.0 - 46.0 %   MCV 72.8 (L) 78.0 - 100.0 fL   MCH 18.0 (L) 26.0 - 34.0 pg   MCHC 24.7 (L) 30.0 - 36.0 g/dL   RDW 18.5 (H) 11.5 - 15.5 %   Platelets 148 (L) 150 - 400 K/uL  Basic metabolic panel     Status: Abnormal   Collection Time: 09/11/15  3:25 AM  Result Value Ref Range   Sodium 136 135 - 145 mmol/L   Potassium 4.0 3.5 - 5.1 mmol/L   Chloride 84 (L) 101 - 111 mmol/L   CO2 42 (H) 22 - 32 mmol/L   Glucose, Bld 212 (H) 65 - 99 mg/dL   BUN 24 (H) 6 - 20 mg/dL   Creatinine, Ser 1.28 (H) 0.44 - 1.00 mg/dL   Calcium 6.9 (L) 8.9 - 10.3 mg/dL   GFR calc non Af Amer 41 (L) >60 mL/min   GFR calc Af Amer 48 (L) >60 mL/min    Comment: (NOTE) The eGFR has been calculated using the CKD EPI equation. This calculation has not been validated in all clinical situations. eGFR's persistently <60 mL/min signify possible Chronic Kidney Disease.    Anion gap 10 5 - 15  Type and screen [place]     Status: None (Preliminary result)   Collection Time: 09/11/15  5:28 AM  Result Value Ref Range   ABO/RH(D) O NEG    Antibody Screen NEG    Sample Expiration 09/14/2015    Unit Number [id number]    Blood Component Type RED CELLS,LR     Unit division 00    Status of Unit ISSUED,FINAL    Transfusion Status OK TO TRANSFUSE    Crossmatch Result Compatible    Unit Number [id number]    Blood Component Type RED CELLS,LR    Unit  division 00    Status of Unit ALLOCATED    Transfusion Status OK TO TRANSFUSE    Crossmatch Result Compatible    Unit Number [id number]    Blood Component Type RBC CPDA1, LR    Unit division 00    Status of Unit ISSUED,FINAL    Transfusion Status OK TO TRANSFUSE    Crossmatch Result Compatible   Prepare RBC     Status: None   Collection Time: 09/11/15  5:28 AM  Result Value Ref Range   Order Confirmation ORDER PROCESSED BY BLOOD BANK   Glucose, capillary     Status: Abnormal   Collection Time: 09/11/15  5:54 AM  Result Value Ref Range   Glucose-Capillary 186 (H) 65 - 99 mg/dL   Comment 1 Notify RN    Comment 2 Document in Chart   Prepare RBC     Status: None   Collection Time: 09/11/15  7:38 AM  Result Value Ref Range   Order Confirmation ORDER PROCESSED BY BLOOD BANK   Glucose, capillary     Status: Abnormal   Collection Time: 09/11/15 12:59 PM  Result Value Ref Range   Glucose-Capillary 231 (H) 65 - 99 mg/dL   Comment 1 Notify RN    Comment 2 Document in Chart   Glucose, capillary     Status: Abnormal   Collection Time: 09/11/15  5:37 PM  Result Value Ref Range   Glucose-Capillary 270 (H) 65 - 99 mg/dL   Comment 1 Notify RN    Comment 2 Document in Chart   Glucose, capillary     Status: Abnormal   Collection Time: 09/11/15  9:22 PM  Result Value Ref Range   Glucose-Capillary 194 (H) 65 - 99 mg/dL  Basic metabolic panel     Status: Abnormal   Collection Time: 09/12/15  3:02 AM  Result Value Ref Range   Sodium 134 (L) 135 - 145 mmol/L   Potassium 4.1 3.5 - 5.1 mmol/L   Chloride 81 (L) 101 - 111 mmol/L   CO2 41 (H) 22 - 32 mmol/L   Glucose, Bld 220 (H) 65 - 99 mg/dL   BUN 27 (H) 6 - 20 mg/dL   Creatinine, Ser 1.25 (H) 0.44 - 1.00 mg/dL   Calcium 7.0 (L) 8.9 - 10.3 mg/dL     GFR calc non Af Amer 42 (L) >60 mL/min   GFR calc Af Amer 49 (L) >60 mL/min    Comment: (NOTE) The eGFR has been calculated using the CKD EPI equation. This calculation has not been validated in all clinical situations. eGFR's persistently <60 mL/min signify possible Chronic Kidney Disease.    Anion gap 12 5 - 15  CBC     Status: Abnormal   Collection Time: 09/12/15  3:02 AM  Result Value Ref Range   WBC 6.2 4.0 - 10.5 K/uL   RBC 4.37 3.87 - 5.11 MIL/uL   Hemoglobin 8.9 (L) 12.0 - 15.0 g/dL    Comment: DELTA CHECK NOTED REPEATED TO VERIFY POST TRANSFUSION SPECIMEN    HCT 33.5 (L) 36.0 - 46.0 %   MCV 76.7 (L) 78.0 - 100.0 fL   MCH 20.4 (L) 26.0 - 34.0 pg   MCHC 26.6 (L) 30.0 - 36.0 g/dL   RDW 19.7 (H) 11.5 - 15.5 %   Platelets 134 (L) 150 - 400 K/uL  Glucose, capillary     Status: Abnormal   Collection Time: 09/12/15  6:57 AM  Result Value Ref Range   Glucose-Capillary 211 (  H) 65 - 99 mg/dL   Comment 1 Notify RN    Comment 2 Document in Chart   Glucose, capillary     Status: Abnormal   Collection Time: 09/12/15 12:20 PM  Result Value Ref Range   Glucose-Capillary 211 (H) 65 - 99 mg/dL    No results found.  Review of Systems  Constitutional: Negative for fever and diaphoresis.  HENT: Negative for congestion.   Respiratory: Positive for shortness of breath. Negative for cough.   Cardiovascular: Positive for orthopnea and leg swelling. Negative for chest pain, palpitations and claudication.  Gastrointestinal: Negative for nausea, vomiting and abdominal pain.  Genitourinary: Negative for hematuria.  Musculoskeletal: Negative for myalgias.  Neurological: Negative for dizziness and weakness.  All other systems reviewed and are negative.  Blood pressure 101/70, pulse 104, temperature 98.4 F (36.9 C), temperature source Oral, resp. rate 18, height _0  (1.676 m), weight 200 lb 4.8 oz (90.855 kg), SpO2 92 %. Physical Exam  Nursing note and vitals  reviewed. Constitutional: She is oriented to person, place, and time. She appears well-developed. No distress.  Obese   HENT:  Head: Normocephalic and atraumatic.  Eyes: EOM are normal. Pupils are equal, round, and reactive to light.  Neck: Normal range of motion. Neck supple. JVD present.  Cardiovascular: Normal rate, regular rhythm and S2 normal.   Murmur heard.  Systolic murmur is present with a grade of 2/6  Pulses:      Radial pulses are 2+ on the right side, and 2+ on the left side.       Dorsalis pedis pulses are 2+ on the right side, and 2+ on the left side.  Respiratory: Effort normal. She has no wheezes. She has no rales.  Decreased breath sounds bilateral bases  GI: Soft. Bowel sounds are normal. She exhibits distension. There is tenderness (mild).  Abdomen is tight and distended  Musculoskeletal: She exhibits edema.  2+ lower extremity edema  Lymphadenopathy:    She has no cervical adenopathy.  Neurological: She is alert and oriented to person, place, and time. She exhibits normal muscle tone.  Skin: Skin is warm and dry. There is erythema.  Psychiatric: She has a normal mood and affect.    Assessment/Plan:    Essential hypertension  Hypotensive this morning.   COPD with emphysema ([place])   On home oxygen therapy   CAD (coronary artery disease)  No CP.  Tropon x 3.  From CHF   DM type 2 (diabetes mellitus, type 2) ([facility])   Acute on chronic diastolic CHF (congestive heart failure), NYHA class 1 ([facility]) This is acute on chronic diastolic heart failure in the setting of moderate to severe aortic stenosis,  mild MR, moderate TR and mod to sev pulmonary HTN.   Net fluids of -1.1 L/-9.6 L.  She's was getting 60 mg of IV Lasix twice daily but it was discontinued as of today.  I suspect this was due to hypotension.  She still has significant edema up to her posterior thighs and abdomen.  Recommend  resuming lasix at a lower dose to diurese at a slower rate, perhaps tomorrow, once  BP improves.  Perhaps 36m IV BID.   We talked about daily weight monitoring and low sodium diet.   SCr stable.   We talked about maintaining follow up in the clinic(Not seen in 3.5 years).     Microcytic anemia    Abnormal chest x-ray     Emillia Weatherly, PAC 09/12/2015, 2:29 PM

## 2015-09-12 NOTE — Progress Notes (Signed)
Occupational Therapy Treatment Patient Details Name: KINDEL ROCHEFORT MRN: [id number] DOB: 1945-07-21 Today's Date: 09/12/2015    History of present illness KATHERINE TOUT is a 70 y.o. female has a past medical history of Diabetes mellitus; COPD (chronic obstructive pulmonary disease); Sleep apnea, obstructive; Pulmonary HTN ; Obesity; Hyperlipidemia; Myocardial infarction; Coronary artery disease; Anxiety; Shortness of breath; GERD (gastroesophageal reflux disease); Arthritis; CHF (congestive heart failure); Pneumonia; Bronchitis; and History of hiatal hernia. Admitted for Acute on chronic diastolic CHF   OT comments  This 70 yo female admitted with above presents to acute OT today with decrease in O2 sats with activity, soft BP, and LE swelling and pain all affecting her ability to be at the point to be able to go back home alone. She is making progress with overall mobility and is aware of energy conservation strategies. She will continue to benefit from acute OT with follow up OT at SNF.  Follow Up Recommendations  SNF    Equipment Recommendations  None recommended by OT       Precautions / Restrictions Precautions Precautions: Fall Precaution Comments: monitor O2 an BP Restrictions Weight Bearing Restrictions: No       Mobility Bed Mobility               General bed mobility comments: in chair on arrival  Transfers Overall transfer level: Needs assistance Equipment used: Rolling walker (2 wheeled) Transfers: Sit to/from Stand Sit to Stand: Supervision         General transfer comment: M[place] A ambulating around in room manangaing her own O2 line and "furniture walking" (no SPC available and RW around in her room is difficult to manipulate due to tight quarters    Balance Overall balance assessment: Needs assistance Sitting-balance support: No upper extremity supported;Feet supported Sitting balance-Leahy Scale: Good     Standing balance support: Single  extremity supported;During functional activity   Standing balance comment: Really feels like she needs to have at least one UE supported when up on her feet with any acivity, can stand statically without support                   ADL Overall ADL's : Needs assistance/impaired                                       General ADL Comments: We discussed energy conservation techniques she can use for basic ADLs and IADLs--most of which she had already been using pta. She uses push cart and/or electric cart in stores depending on how she is feeling. She sits to take a shower and then does her grooming and dressing either straight way or in increments depending on how she is feeling. She sits to fold her laundery at her kitchen table right next to her washer/dryer. She is aware of purse lipped breathing. She is aware that anyting she does sitting takes less energy than standing.                Cognition   Behavior During Therapy: WFL for tasks assessed/performed Overall Cognitive Status: Within Functional Limits for tasks assessed                                    Pertinent Vitals/ Pain       Pain Assessment: 0-10 Faces Pain  Scale: Hurts even more Pain Location: Bil LES Pain Descriptors / Indicators: Aching;Tightness;Sore Pain Intervention(s): Monitored during session;Repositioned         Frequency Min 2X/week     Progress Toward Goals  OT Goals(current goals can now be found in the care plan section)  Progress towards OT goals: Progressing toward goals     Plan Discharge plan remains appropriate       End of Session Equipment Utilized During Treatment: Oxygen (4 liters)   Activity Tolerance Patient limited by pain (needed to purse lip breath post ambuating around in her room due to O2 sats dropping and increased WOB)   Patient Left in chair;with call bell/phone within reach   Nurse Communication  (pt thought she was not suppose to  ambulate in hallways with staff--checked with RN and she said this was fine just need to monitor O2 and Bp (soft)--made pt aware of this)        Time: 1134-1200 OT Time Calculation (min): 26 min  Charges: OT General Charges $OT Visit: 1 Procedure OT Treatments $Self Care/Home Management : 23-37 mins  Almon Register [phone] 09/12/2015, 12:19 PM

## 2015-09-12 NOTE — Progress Notes (Signed)
Physical Therapy Treatment Patient Details Name: Jillian Bright MRN: [id number] DOB: 09-10-1945 Today's Date: 09/12/2015    History of Present Illness Jillian Bright is a 70 y.o. female has a past medical history of Diabetes mellitus; COPD (chronic obstructive pulmonary disease); Sleep apnea, obstructive; Pulmonary HTN ; Obesity; Hyperlipidemia; Myocardial infarction; Coronary artery disease; Anxiety; Shortness of breath; GERD (gastroesophageal reflux disease); Arthritis; CHF (congestive heart failure); Pneumonia; Bronchitis; and History of hiatal hernia. Admitted for Acute on chronic diastolic CHF    PT Comments    Pt seemed to have altered mental status with therapy today as her affect varied between flat, anxious, impulsive, and agitated. She also presented with short term memory deficits. She was able to ambulate to the door and back but demonstrates safety concerns as she had 3 LOB with RW but then tends to abandon the RW at times. She has not been like this in previous PT sessions. Pt also presents with symptomatic orthostatic hypotension and BP continues to decrease with activity (see detailed vitals below) and still requires 4L [place] to maintain Sats >90% with activity. Pt will continue to benefit from PT services to improve functional mobility and decrease fall risk.    Follow Up Recommendations  SNF;Supervision/Assistance - 24 hour     Equipment Recommendations  None recommended by PT    Recommendations for Other Services       Precautions / Restrictions Precautions Precautions: Fall Precaution Comments: monitor O2 an BP Restrictions Weight Bearing Restrictions: No    Mobility  Bed Mobility               General bed mobility comments: in chair on arrival  Transfers Overall transfer level: Needs assistance Equipment used: Rolling walker (2 wheeled) Transfers: Sit to/from Stand Sit to Stand: Min guard         General transfer comment: Min Guard for safety as  pt has marked sway with initial sit to stand.   Ambulation/Gait Ambulation/Gait assistance: Min guard Ambulation Distance (Feet): 30 Feet (15+15 (went to bathroom in between)) Assistive device: Rolling walker (2 wheeled) Gait Pattern/deviations: Step-through pattern;Decreased stride length;Trunk flexed;Wide base of support Gait velocity: decreased   General Gait Details: Pt has 3 LOB posteriorly with use of RW when walking to door but able to self correc with UE support and stepping strategy. Pt is not aware that her balance is poor as she abandons walker at times and states she doesn't need it.   Stairs            Wheelchair Mobility    Modified Rankin (Stroke Patients Only)       Balance Overall balance assessment: Needs assistance Sitting-balance support: Feet supported;No upper extremity supported Sitting balance-Leahy Scale: Good     Standing balance support: Single extremity supported Standing balance-Leahy Scale: Poor Standing balance comment: Actively seeks UE support. Very unsteady on her feet. 3 LOB posteriorly.                     Cognition Arousal/Alertness: Awake/alert Behavior During Therapy: Flat affect;Impulsive;Anxious;Agitated (Varied throughout session. ) Overall Cognitive Status: Impaired/Different from baseline Area of Impairment: Memory;Safety/judgement     Memory: Decreased short-term memory   Safety/Judgement: Decreased awareness of deficits;Decreased awareness of safety     General Comments: Pt leaving walker at times, saying her balance is fine to go into the bathroom with out it and without help when she had 3 LOB posteriorly walking into the bathroom. She pused the O2 tank out  of the bathroom then when we got to the chair asked me if the O2 tank was connected to her. Her mood/affect varied between pleasant and agitated throughout the session as well.     Exercises      General Comments General comments (skin integrity, edema,  etc.): Pt had bowel movement during session. Independent with toileting.       Pertinent Vitals/Pain Pain Assessment: 0-10 Pain Score: 8  Pain Location: BLE's Pain Descriptors / Indicators: Aching;Constant;Discomfort;Burning Pain Intervention(s): Monitored during session;Repositioned  BP in Sitting at rest: 102/70 mmHg, SpO2 100% on 4L [place], HR 92 BP with initial standing: 86/67 mmHg, SpO2 100% on 4L [place], HR 107 BP after 3 minutes of standing: 97/60 mmHg, SpO2 100% on 4L [place],  BP after ambulation (in standing): 85/87 mmHg, SpO2 90-100% on 4L [place], HR 108    Home Living                      Prior Function            PT Goals (current goals can now be found in the care plan section) Acute Rehab PT Goals Patient Stated Goal: To go home PT Goal Formulation: With patient Time For Goal Achievement: 09/22/15 Potential to Achieve Goals: Good Progress towards PT goals: Progressing toward goals    Frequency  Min 3X/week    PT Plan Current plan remains appropriate    Co-evaluation             End of Session Equipment Utilized During Treatment: Gait belt;Oxygen Activity Tolerance: Patient tolerated treatment well Patient left: in chair;with call bell/phone within reach;with chair alarm set     Time: 1445-1511 PT Time Calculation (min) (ACUTE ONLY): 26 min  Charges:  $Gait Training: 8-22 mins $Therapeutic Activity: 8-22 mins                    G Codes:      Colon Branch, SPT Colon Branch 09/12/2015, 4:18 PM

## 2015-09-12 NOTE — Progress Notes (Signed)
CSW met with patient in her room to extend bed offers. She accepted placement at [facility]. CSW also made patient aware of other SNFs where she was accepted. Patient expressed concern about getting her belongings from home. She reports needing PTAR services upon discharge. CSW contacted Rollene Fare at [facility] and left a voicemail notifying her that patient has accepted the bed offer and will be discharging either tomorrow or Wednesday.  CSW is available if any other concerns arise before discharge.  Dayton Scrape, [place]

## 2015-09-12 NOTE — Care Management Important Message (Signed)
Important Message  Patient Details  Name: Jillian Bright MRN: [id number] Date of Birth: 20-Dec-1945   Medicare Important Message Given:  Yes    Barb Merino Masayuki Sakai 09/12/2015, 1:46 PM

## 2015-09-13 ENCOUNTER — Ambulatory Visit: Payer: Medicare Other | Admitting: Pulmonary Disease

## 2015-09-13 ENCOUNTER — Inpatient Hospital Stay (HOSPITAL_COMMUNITY): Payer: Medicare Other

## 2015-09-13 ENCOUNTER — Other Ambulatory Visit: Payer: Self-pay | Admitting: *Deleted

## 2015-09-13 ENCOUNTER — Encounter (HOSPITAL_COMMUNITY): Payer: Self-pay | Admitting: Thoracic Surgery (Cardiothoracic Vascular Surgery)

## 2015-09-13 DIAGNOSIS — I5023 Acute on chronic systolic (congestive) heart failure: Secondary | ICD-10-CM

## 2015-09-13 DIAGNOSIS — I071 Rheumatic tricuspid insufficiency: Secondary | ICD-10-CM | POA: Diagnosis present

## 2015-09-13 DIAGNOSIS — I340 Nonrheumatic mitral (valve) insufficiency: Secondary | ICD-10-CM | POA: Diagnosis present

## 2015-09-13 DIAGNOSIS — I350 Nonrheumatic aortic (valve) stenosis: Secondary | ICD-10-CM

## 2015-09-13 DIAGNOSIS — I110 Hypertensive heart disease with heart failure: Secondary | ICD-10-CM

## 2015-09-13 DIAGNOSIS — I358 Other nonrheumatic aortic valve disorders: Secondary | ICD-10-CM

## 2015-09-13 DIAGNOSIS — I509 Heart failure, unspecified: Secondary | ICD-10-CM

## 2015-09-13 DIAGNOSIS — I50810 Right heart failure, unspecified: Secondary | ICD-10-CM

## 2015-09-13 LAB — BASIC METABOLIC PANEL
ANION GAP: 12 (ref 5–15)
BUN: 29 mg/dL — ABNORMAL HIGH (ref 6–20)
CHLORIDE: 84 mmol/L — AB (ref 101–111)
CO2: 41 mmol/L — ABNORMAL HIGH (ref 22–32)
Calcium: 7.6 mg/dL — ABNORMAL LOW (ref 8.9–10.3)
Creatinine, Ser: 1.12 mg/dL — ABNORMAL HIGH (ref 0.44–1.00)
GFR calc non Af Amer: 48 mL/min — ABNORMAL LOW (ref >60)
GFR, EST AFRICAN AMERICAN: 56 mL/min — AB (ref >60)
GLUCOSE: 202 mg/dL — AB (ref 65–99)
POTASSIUM: 4.5 mmol/L (ref 3.5–5.1)
Sodium: 137 mmol/L (ref 135–145)

## 2015-09-13 LAB — URINALYSIS, ROUTINE W REFLEX MICROSCOPIC
Bilirubin Urine: NEGATIVE
GLUCOSE, UA: NEGATIVE mg/dL
Hgb urine dipstick: NEGATIVE
Ketones, ur: NEGATIVE mg/dL
LEUKOCYTES UA: NEGATIVE
NITRITE: NEGATIVE
PROTEIN: NEGATIVE mg/dL
Specific Gravity, Urine: 1.031 — ABNORMAL HIGH (ref 1.005–1.030)
pH: 6 (ref 5.0–8.0)

## 2015-09-13 LAB — CBC
HEMATOCRIT: 34 % — AB (ref 36.0–46.0)
HEMOGLOBIN: 8.8 g/dL — AB (ref 12.0–15.0)
MCH: 20.1 pg — AB (ref 26.0–34.0)
MCHC: 25.9 g/dL — AB (ref 30.0–36.0)
MCV: 77.8 fL — ABNORMAL LOW (ref 78.0–100.0)
Platelets: 134 10*3/uL — ABNORMAL LOW (ref 150–400)
RBC: 4.37 MIL/uL (ref 3.87–5.11)
RDW: 20.5 % — ABNORMAL HIGH (ref 11.5–15.5)
WBC: 5.9 10*3/uL (ref 4.0–10.5)

## 2015-09-13 LAB — GLUCOSE, CAPILLARY
GLUCOSE-CAPILLARY: 225 mg/dL — AB (ref 65–99)
GLUCOSE-CAPILLARY: 117 mg/dL — AB (ref 65–99)
Glucose-Capillary: 181 mg/dL — ABNORMAL HIGH (ref 65–99)
Glucose-Capillary: 264 mg/dL — ABNORMAL HIGH (ref 65–99)

## 2015-09-13 MED ORDER — INSULIN GLARGINE 100 UNIT/ML ~~LOC~~ SOLN
5.0000 [IU] | Freq: Every day | SUBCUTANEOUS | Status: DC
  Administered 2015-09-13 – 2015-09-24 (×11): 5 [IU] via SUBCUTANEOUS
  Filled 2015-09-13 (×14): qty 0.05

## 2015-09-13 MED ORDER — INSULIN ASPART 100 UNIT/ML ~~LOC~~ SOLN
3.0000 [IU] | Freq: Three times a day (TID) | SUBCUTANEOUS | Status: DC
  Administered 2015-09-13 – 2015-09-24 (×28): 3 [IU] via SUBCUTANEOUS

## 2015-09-13 MED ORDER — IOPAMIDOL (ISOVUE-370) INJECTION 76%
100.0000 mL | Freq: Once | INTRAVENOUS | Status: AC | PRN
  Administered 2015-09-13: 100 mL via INTRAVENOUS

## 2015-09-13 MED ORDER — DOPAMINE-DEXTROSE 3.2-5 MG/ML-% IV SOLN
2.5000 ug/kg/min | INTRAVENOUS | Status: DC
  Administered 2015-09-13: 2.5 ug/kg/min via INTRAVENOUS
  Filled 2015-09-13: qty 250

## 2015-09-13 NOTE — Consult Note (Addendum)
[address]       ,[place] [address]             [phone]          CARDIOTHORACIC SURGERY CONSULTATION REPORT  PCP is [name], MD Referring Provider is  [facility], Orpah Melter, MD Primary Cardiologist is [facility], Elby Showers, MD Primary Pulmonologist is [name], MD  Reason for consultation:  Aortic stenosis  HPI:  Patient is a 70 year old morbidly obese female with history of chronic diastolic congestive heart failure, aortic stenosis, pulmonary hypertension, tricuspid regurgitation, coronary artery disease with PCI and stenting in the remote past, rheumatoid arthritis, and chronic respiratory failure on home oxygen therapy secondary to severe COPD, obesity with hypoventilation syndrome and obstructive sleep apnea who was admitted to the hospital with an acute exacerbation of chronic respiratory failure and has been referred for surgical consultation to discuss treatment options for management of aortic stenosis.  The patient's cardiac history dates back to 2002 when she suffered an inferior wall myocardial infarction. She was treated with PCI and stenting of the right coronary artery at that time. She remained stable from a cardiac standpoint until 2009 when she presented with acute exacerbation of chronic diastolic congestive heart failure and severe peripheral edema.  Echocardiogram performed at that time revealed right ventricular failure and pulmonary hypertension.  Right heart catheterization confirmed the presence of moderate pulmonary hypertention that responded well to oxygen therapy. She was diagnosed with obesity hypoventilation syndrome and obstructive sleep apnea. She quit smoking and has been using home oxygen therapy ever since. She has been followed by Dr. Halford Chessman in the pulmonary medicine clinic and was seen most recently by Dr. Aundra Dubin in 2013.  Echocardiogram performed at that time revealed normal left ventricular systolic function with ejection fraction  estimated 60-65%. There was mild to moderate aortic stenosis with peak velocity across the aortic valve reported  2.8 m/s corresponding to mean transvalvular gradient estimated 20 mmHg.  In June 2016 the patient was admitted to the hospital with an acute exacerbation of chronic respiratory failure with both hypercarbia and hypoxemia. She was not seen by cardiology during that hospital admission.  The patient presented acutely to urgent care on 09/07/2015 with a three-week history of progressive lower extremity edema and shortness of breath. She states that she got to the point where she could barely walk. BNP level at the time of presentation was modestly elevated to 848. Chest x-ray revealed right lower lung opacity consistent with atelectasis with effusion versus possible infiltrate. She was admitted to the hospital for acute exacerbation of chronic diastolic and right sided congestive heart failure.  Follow-up echocardiogram performed 09/08/2015 reveals preserved left ventricular systolic function with ejection fraction estimated 60-65%.  Peak velocity across the aortic valve measured 4 m/s corresponding to mean transvalvular gradient estimated 34 mmHg.  Peak pulmonary artery pressure was estimated 61 mmHg.  Moderate pulmonary hypertension was reported.  The patient was treated with intravenous Lasix. She was also found to have microcytic iron deficient anemia.  Cardiothoracic surgical consultation was requested.  Patient is divorced and lives alone in [place]. 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 She has been chronically limited by severe exertional shortness of breath and respiratory failure. She has remained oxygen dependent 24 hours a day, 7 days a week since 2009.  She successfully quit smoking in 2009.  Despite this she remains functionally  independent for the most part. She drives an automobile and tends to her own personal needs.  Her mobility is limited because of severe exertional shortness of breath, morbid obesity, and chronic pain related to rheumatoid arthritis and severe lower extremity edema. She states that her gait has become somewhat unsteady and she typically uses a cane for stabilization.  She states that the main reason she came to the hospital for this admission was inability to walk. She denies any history of resting shortness of breath, PND, orthopnea. She reports occasional fleeting episodes of chest pain that are not related to exertion. She has chronic bilateral lower extremity edema which has been much worse recently and associated with both erythema and spontaneous transudate of drainage through the skin.  She has had occasional brief dizzy spells without syncope.  She has demonstrated evidence for periods of symptomatically orthostatic hypotension during this hospital admission while working with physical therapy.    Past Medical History  Diagnosis Date  . Diabetes mellitus   . COPD (chronic obstructive pulmonary disease) ([place])     sees Dr. [name]  . Sleep apnea, obstructive   . Pulmonary HTN ([place])   . Obesity (BMI 30.0-34.9)   . Hyperlipidemia   . Myocardial infarction ([place])   . Coronary artery disease   . Anxiety   . Shortness of breath   . GERD (gastroesophageal reflux disease)   . Arthritis     rheumatoid, sees Dr. Tobie Lords  . CHF (congestive heart failure) ([place])   . Pneumonia   . Bronchitis     hx of   . History of hiatal hernia   . Acute on chronic diastolic (congestive) heart failure ([place]) 09/07/2015  . Aortic stenosis 01/14/2012    Past Surgical History  Procedure Laterality Date  . Appendectomy    . Tonsillectomy and adenoidectomy    . Lumbar disc surgery    . Hemorrhoid surgery    . Coronary angioplasty    . Endarterectomy Left 12/04/2012    Procedure: ENDARTERECTOMY CAROTID;  Surgeon: Mal Misty, MD;  Location: [facility];  Service: Vascular;  Laterality: Left;  .  Patch angioplasty Left 12/04/2012    Procedure: PATCH ANGIOPLASTY;  Surgeon: Mal Misty, MD;  Location: [facility];  Service: Vascular;  Laterality: Left;  [facility] Kitchen Eye surgery      bilat cataract surg   . Colonscopy     . Total hip arthroplasty Left 07/30/2014    Procedure: LEFT TOTAL HIP ARTHROPLASTY ANTERIOR APPROACH;  Surgeon: Meredith Pel, MD;  Location: [facility];  Service: Orthopedics;  Laterality: Left;    Family History  Problem Relation Age of Onset  . Hypertension Mother   . Hyperlipidemia Mother   . Stroke Mother   . Heart attack Mother   . Cancer Mother   . Heart attack Father   . Cancer Brother   . Asthma Maternal Grandfather     Social History   Social History  . Marital Status: Widowed    Spouse Name: N/A  . Number of Children: N/A  . Years of Education: N/A   Occupational History  . used to work as a Theme park manager    Social History Main Topics  . Smoking status: Former Smoker -- 2.00 packs/day for 36 years    Types: Cigarettes    Quit date: 02/02/2013  . Smokeless tobacco: Never Used     Comment: had restarted and quit again 01/27/12   . Alcohol Use: 0.0 oz/week  0 Standard drinks or equivalent per week     Comment: rare  . Drug Use: No  . Sexual Activity: Not on file   Other Topics Concern  . Not on file   Social History Narrative   Lives alone    Prior to Admission medications   Medication Sig Start Date End Date Taking? Authorizing Provider  albuterol (PROVENTIL HFA;VENTOLIN HFA) 108 (90 BASE) MCG/ACT inhaler Inhale 2 puffs into the lungs every 6 (six) hours as needed for wheezing or shortness of breath.   Yes Historical Provider, MD  ALPRAZolam (XANAX) 0.5 MG tablet TAKE 1 TABLET BY MOUTH 3 TIMES A DAY AS NEEDED Patient taking differently: TAKE 1 TABLET BY MOUTH 3 TIMES A DAY AS NEEDED anxiety 04/01/15  Yes [name], MD  atorvastatin (LIPITOR) 20 MG tablet Take 1 tablet (20 mg total) by mouth daily. 12/07/14  Yes [name], MD    budesonide-formoterol [facility]) 160-4.5 MCG/ACT inhaler Inhale 2 puffs into the lungs 2 (two) times daily. 10/26/13  Yes Tammy S Parrett, NP  DULoxetine (CYMBALTA) 60 MG capsule Take 1 capsule (60 mg total) by mouth daily. 12/07/14  Yes [name], MD  gabapentin (NEURONTIN) 300 MG capsule TAKE ONE CAPSULE 3 TIMES A DAY 05/24/15  Yes [name], MD  glipiZIDE (GLUCOTROL) 10 MG tablet TAKE 1 TABLET (10 MG TOTAL) BY MOUTH 2 (TWO) TIMES DAILY BEFORE A MEAL. 06/20/14  Yes [name], MD  metFORMIN (GLUCOPHAGE) 500 MG tablet Take 1 tablet (500 mg total) by mouth 3 (three) times daily. 12/07/14  Yes [name], MD  omeprazole (PRILOSEC) 40 MG capsule Take 1 capsule (40 mg total) by mouth daily before breakfast. 12/07/14  Yes [name], MD  OVER THE COUNTER MEDICATION Place 1-2 drops into both eyes daily as needed (dry eyes.). Soothe.   Yes Historical Provider, MD  sitaGLIPtin (JANUVIA) 100 MG tablet Take 1 tablet (100 mg total) by mouth daily. 06/04/14  Yes [name], MD  solifenacin (VESICARE) 10 MG tablet Take 1 tablet (10 mg total) by mouth daily. 06/04/14  Yes [name], MD  spironolactone (ALDACTONE) 25 MG tablet Take 1 tablet (25 mg total) by mouth every morning. 12/07/14  Yes [name], MD  traZODone (DESYREL) 50 MG tablet Take 1 tablet (50 mg total) by mouth at bedtime. 12/07/14  Yes [name], MD  vitamin B-12 (CYANOCOBALAMIN) 1000 MCG tablet Take 1 tablet (1,000 mcg total) by mouth daily. 11/19/14  Yes Delfina Redwood, MD    Current Facility-Administered Medications  Medication Dose Route Frequency Provider Last Rate Last Dose  . 0.9 %  sodium chloride infusion  250 mL Intravenous PRN Toy Baker, MD      . 0.9 %  sodium chloride infusion   Intravenous Once Jeryl Columbia, NP      . acetaminophen (TYLENOL) tablet 650 mg  650 mg Oral Q4H PRN Toy Baker, MD      . ALPRAZolam Duanne Moron) tablet 0.5 mg  0.5 mg Oral TID PRN Toy Baker, MD   0.5 mg at  09/13/15 1047  . aspirin chewable tablet 81 mg  81 mg Oral Daily Donne Hazel, MD   81 mg at 09/13/15 1032  . atorvastatin (LIPITOR) tablet 20 mg  20 mg Oral Daily Toy Baker, MD   20 mg at 09/13/15 1031  . darifenacin (ENABLEX) 24 hr tablet 7.5 mg  7.5 mg Oral Daily Toy Baker, MD  7.5 mg at 09/13/15 1031  . DOPamine (INTROPIN) 800 mg in dextrose 5 % 250 mL (3.2 mg/mL) infusion  2.5 mcg/kg/min Intravenous Titrated Skeet Latch, MD      . DULoxetine (CYMBALTA) DR capsule 60 mg  60 mg Oral Daily Toy Baker, MD   60 mg at 09/13/15 1031  . gabapentin (NEURONTIN) capsule 300 mg  300 mg Oral TID Toy Baker, MD   300 mg at 09/13/15 1031  . insulin aspart (novoLOG) injection 0-5 Units  0-5 Units Subcutaneous QHS Toy Baker, MD   3 Units at 09/12/15 2256  . insulin aspart (novoLOG) injection 0-9 Units  0-9 Units Subcutaneous TID WC Toy Baker, MD   2 Units at 09/13/15 0612  . mometasone-formoterol (DULERA) 200-5 MCG/ACT inhaler 2 puff  2 puff Inhalation BID Toy Baker, MD   2 puff at 09/10/15 1024  . ondansetron (ZOFRAN) injection 4 mg  4 mg Intravenous Q6H PRN Toy Baker, MD      . pantoprazole (PROTONIX) EC tablet 40 mg  40 mg Oral Daily Toy Baker, MD   40 mg at 09/13/15 1030  . polyethylene glycol (MIRALAX / GLYCOLAX) packet 17 g  17 g Oral Daily Donne Hazel, MD   17 g at 09/13/15 1032  . sodium chloride flush (NS) 0.9 % injection 3 mL  3 mL Intravenous Q12H Toy Baker, MD   3 mL at 09/13/15 1032  . sodium chloride flush (NS) 0.9 % injection 3 mL  3 mL Intravenous PRN Toy Baker, MD      . traMADol (ULTRAM) tablet 50 mg  50 mg Oral Q6H PRN Toy Baker, MD   50 mg at 09/12/15 2215  . traZODone (DESYREL) tablet 50 mg  50 mg Oral QHS Toy Baker, MD   50 mg at 09/12/15 2215    Allergies  Allergen Reactions  . Codeine Itching    Takes vicodin at home      Review of  Systems:   General:  normal appetite, decreased energy, no weight gain, no weight loss, no fever  Cardiac:  no chest pain with exertion, occasional fleeting chest pain at rest, +SOB with exertion, no resting SOB, no PND, no orthopnea, no palpitations, no arrhythmia, no atrial fibrillation, + LE edema, + dizzy spells, no syncope  Respiratory:  + shortness of breath, + home oxygen, no productive cough, no dry cough, no bronchitis, no wheezing, no hemoptysis, no asthma, no pain with inspiration or cough, + sleep apnea, intolerant of CPAP at night  GI:   no difficulty swallowing, no reflux, no frequent heartburn, no hiatal hernia, no abdominal pain, + constipation, no diarrhea, no hematochezia, no hematemesis, no melena  GU:   no dysuria,  no frequency, no urinary tract infection, no hematuria, no kidney stones, no kidney disease  Vascular:  no pain suggestive of claudication, + pain in feet, no leg cramps, no varicose veins, no DVT, no non-healing foot ulcer  Neuro:   no stroke, no TIA's, no seizures, no headaches, no temporary blindness one eye,  no slurred speech, no peripheral neuropathy, + chronic pain, + instability of gait, no memory/cognitive dysfunction  Musculoskeletal: + arthritis - primarily involving the hips, knees and back, + joint swelling, no myalgias, + difficulty walking, limited mobility   Skin:   + both lower legs rash, no itching, no skin infections, no pressure sores or ulcerations  Psych:   + anxiety, + depression, no nervousness, no unusual recent stress  Eyes:   no blurry  vision, no floaters, no recent vision changes, + wears glasses or contacts  ENT:   no hearing loss, no loose or painful teeth, no dentures, last saw dentist within the past year  Hematologic:  no easy bruising, no abnormal bleeding, no clotting disorder, no frequent epistaxis  Endocrine:  + diabetes, does not check CBG's at home     Physical Exam:   BP 104/69 mmHg  Pulse 102  Temp(Src) 98 F (36.7 C)  (Oral)  Resp 18  Ht '5\' 6"'$  (1.676 m)  Wt 201 lb (91.173 kg)  BMI 32.46 kg/m2  SpO2 98%  General:  Morbidly obese female, NAD    HEENT:  Unremarkable   Neck:   no JVD, no bruits, no adenopathy   Chest:   clear to auscultation, decreased breath sounds right base, no wheezes, no rhonchi   CV:   RRR, grade III/VI systolic murmur best LLSB  Abdomen:  soft, non-tender, no masses   Extremities:  warm, well-perfused, pulses not palpable, severe bilateral lower extremity edema with erythema secondary to chronic venous insufficiency  Rectal/GU  Deferred  Neuro:   Grossly non-focal and symmetrical throughout  Skin:   Clean and dry, + erythema both lower legs, no breakdown  Diagnostic Tests:  Transthoracic Echocardiography  Patient: Billy, Rocco MR #: [id number] Study Date: 09/08/2015 Gender: F Age: 45 Height: 167.6 cm Weight: 84.1 kg BSA: 2 m^2 Pt. Status: Room: [place] [place] ADMITTING Toy Baker [address] [id number] Esau Grew [id number] SONOGRAPHER Jimmy Reel, RDCS PERFORMING [facility], Inpatient  cc:  ------------------------------------------------------------------- LV EF: 60% - 65%  ------------------------------------------------------------------- Indications: CHF - 428.0.  ------------------------------------------------------------------- History: PMH: Coronary artery disease. Aortic stenosis. Risk factors: Hypertension. Diabetes mellitus.  ------------------------------------------------------------------- Study Conclusions  - Left ventricle: The cavity size was normal. Wall thickness was  normal. Systolic function was normal. The estimated ejection  fraction was in the range of 60% to 65%. Wall motion was normal;  there were no regional wall motion abnormalities. There was  fusion of early and atrial  contributions to ventricular filling.  Doppler parameters are consistent with abnormal left ventricular  relaxation (grade 1 diastolic dysfunction). - Aortic valve: Severely calcified annulus. Severely thickened,  severely calcified leaflets. There was moderate to severe  stenosis. Valve area (VTI): 0.95 cm^2. Valve area (Vmax): 0.94  cm^2. Valve area (Vmean): 0.91 cm^2. - Mitral valve: Mildly to moderately calcified annulus. There was  mild regurgitation. Valve area by continuity equation (using LVOT  flow): 2.43 cm^2. - Left atrium: The atrium was mildly dilated. - Right ventricle: The cavity size was mildly dilated. Systolic  function was mildly reduced. - Right atrium: The atrium was moderately to severely dilated. - Tricuspid valve: There was moderate regurgitation. - Pulmonary arteries: Systolic pressure was moderately increased.  PA peak pressure: 61 mm Hg (S).  Transthoracic echocardiography. M-mode, complete 2D, spectral Doppler, and color Doppler. Birthdate: Patient birthdate: 1945/10/29. Age: Patient is 70 yr old. Sex: Gender: female. BMI: 29.9 kg/m^2. Blood pressure: 138/80 Patient status: Inpatient. Study date: Study date: 09/08/2015. Study time: 02:39 PM. Location: Bedside.  -------------------------------------------------------------------  ------------------------------------------------------------------- Left ventricle: The cavity size was normal. Wall thickness was normal. Systolic function was normal. The estimated ejection fraction was in the range of 60% to 65%. Wall motion was normal; there were no regional wall motion abnormalities. There was fusion of early and atrial contributions to ventricular filling. Doppler parameters are consistent with abnormal left ventricular relaxation (grade 1 diastolic dysfunction).  -------------------------------------------------------------------  Aortic valve: Severely calcified annulus.  Severely thickened, severely calcified leaflets. Doppler: There was moderate to severe stenosis. There was no significant regurgitation. VTI ratio of LVOT to aortic valve: 0.31. Valve area (VTI): 0.95 cm^2. Indexed valve area (VTI): 0.47 cm^2/m^2. Valve area (Vmax): 0.94 cm^2. Indexed valve area (Vmax): 0.47 cm^2/m^2. Mean velocity ratio of LVOT to aortic valve: 0.3. Valve area (Vmean): 0.91 cm^2. Indexed valve area (Vmean): 0.46 cm^2/m^2. Mean gradient (S): 34 mm Hg. Peak gradient (S): 63 mm Hg.  ------------------------------------------------------------------- Aorta: Aortic root: The aortic root was normal in size. Ascending aorta: The ascending aorta was normal in size.  ------------------------------------------------------------------- Mitral valve: Mildly to moderately calcified annulus. Doppler: There was mild regurgitation. Valve area by continuity equation (using LVOT flow): 2.43 cm^2. Indexed valve area by continuity equation (using LVOT flow): 1.21 cm^2/m^2. Mean gradient (D): 7 mm Hg. Peak gradient (D): 5 mm Hg.  ------------------------------------------------------------------- Left atrium: The atrium was mildly dilated.  ------------------------------------------------------------------- Right ventricle: The cavity size was mildly dilated. Systolic function was mildly reduced.  ------------------------------------------------------------------- Pulmonic valve: The valve appears to be grossly normal. Doppler: There was no significant regurgitation.  ------------------------------------------------------------------- Tricuspid valve: The valve appears to be grossly normal. Doppler: There was moderate regurgitation.  ------------------------------------------------------------------- Pulmonary artery: Systolic pressure was moderately increased.  ------------------------------------------------------------------- Right  atrium: The atrium was moderately to severely dilated.  ------------------------------------------------------------------- Pericardium: There was no pericardial effusion.  ------------------------------------------------------------------- Measurements  Left ventricle Value Reference LV ID, ED, PLAX chordal (L) 40.3 mm 43 - 52 LV ID, ES, PLAX chordal 30.6 mm 23 - 38 LV fx shortening, PLAX chordal (L) 24 % >=29 LV PW thickness, ED 13.6 mm --------- IVS/LV PW ratio, ED 1.21 <=1.3 Stroke volume, 2D 71.4 ml --------- Stroke volume/bsa, 2D 36 ml/m^2 --------- LV ejection fraction, 1-p A4C 34 % --------- LV ejection fraction, 2-p 36 % --------- Stroke volume, 2-p 30 ml --------- Stroke volume/bsa, 2-p 15.1 ml/m^2 --------- LV e&', lateral 7.33 cm/s --------- LV E/e&', lateral 15.01 --------- LV s&', lateral 8 cm/s --------- LV e&', medial 5.55 cm/s --------- LV E/e&', medial 19.82 --------- LV e&', average 6.44 cm/s --------- LV E/e&', average 17.08 ---------  Ventricular septum Value Reference IVS thickness, ED 16.4 mm ---------  LVOT Value Reference LVOT ID, S 19.7 mm --------- LVOT area 3.05 cm^2 --------- LVOT mean velocity,  S 82.4 cm/s --------- LVOT VTI, S 23.4 cm ---------  Aortic valve Value Reference Aortic valve peak velocity, S 396 cm/s --------- Aortic valve mean velocity, S 275 cm/s --------- Aortic valve VTI, S 75.1 cm --------- Aortic mean gradient, S 34 mm Hg --------- Aortic peak gradient, S 63 mm Hg --------- VTI ratio, LVOT/AV 0.31 --------- Aortic valve area, VTI 0.95 cm^2 --------- Aortic valve area/bsa, VTI 0.47 cm^2/m^2 --------- Aortic valve area, peak velocity 0.94 cm^2 --------- Aortic valve area/bsa, peak 0.47 cm^2/m^2 --------- velocity Velocity ratio, mean, LVOT/AV 0.3 --------- Aortic valve area, mean velocity 0.91 cm^2 --------- Aortic valve area/bsa, mean 0.46 cm^2/m^2 --------- velocity  Aorta Value Reference Aortic root ID, ED 26.6 mm ---------  Left atrium Value Reference LA ID, A-P, ES 40.9 mm --------- LA ID/bsa, A-P 2.04 cm/m^2 <=2.2  Mitral valve Value Reference Mitral E-wave peak velocity 110 cm/s --------- Mitral A-wave peak velocity 166 cm/s --------- Mitral mean velocity, D 123 cm/s --------- Mitral mean gradient, D 7 mm Hg --------- Mitral peak gradient, D 5 mm Hg --------- Mitral E/A ratio, peak 0.66  --------- Mitral valve area, LVOT 2.43 cm^2 --------- continuity Mitral valve area/bsa, LVOT 1.21  cm^2/m^2 --------- continuity Mitral annulus VTI, D 29.4 cm ---------  Pulmonary arteries Value Reference PA pressure, S, DP (H) 61 mm Hg <=30  Systemic veins Value Reference Estimated CVP 15 mm Hg ---------  Right ventricle Value Reference RV s&', lateral, S 12 cm/s ---------  Legend: (L) and (H) mark values outside specified reference range.  ------------------------------------------------------------------- Prepared and Electronically Authenticated by  Mertie Moores, M.D. [id number]:48:49   Impression:  Patient has stage D severe symptomatic aortic stenosis. She also has at least moderate if not severe pulmonary hypertension with chronic right sided congestive heart failure, tricuspid regurgitation, and severe bilateral lower extremity edema. She presents with primary complaint of severe swelling of both lower legs and inability to walk in the setting of severe volume overload. She gets dyspneic and tired with very low level physical activity, consistent with chronic diastolic and right sided congestive heart failure, New York Heart Association functional class IIIB.  I have personally reviewed the patient's recent transthoracic echocardiogram. There is severe thickening with calcification and restricted leaflet mobility involving all 3 leaflets of the patient's aortic valve. Peak velocity across the aortic valve measures 4 m/s, consistent with severe aortic stenosis. Left ventricular systolic function remains preserved. The patient also has at least mild mitral regurgitation and moderate to severe tricuspid  regurgitation with significant right ventricular chamber enlargement and systolic dysfunction. The patient's clinical picture is further complicated by the presence of underlying severe COPD with chronic hypoxemic and hypercarbic respiratory failure, morbid obesity with obstructive sleep apnea, rheumatoid arthritis, and limited physical mobility with significant physical deconditioning.  Risks associated with conventional surgical aortic valve replacement and tricuspid valve repair would unquestionably be quite high. The patient's long-term prognosis with medical therapy alone would be guarded at best.  There is a fairly high likelihood that she will continue to struggle with intermittent acute exacerbations of both respiratory failure and symptoms of right-sided congestive heart failure regardless of how she is treated in the near future.  Plan:  I have discussed matters at length with the patient in her hospital room this afternoon. The complex nature of her associated problems have been discussed. Under the circumstances it seems prudent to complete a more thorough diagnostic workup before making definitive recommendations for the patient's long-term treatment. She needs CT angiogram of the chest to rule out pulmonary embolus and further evaluate the size of her right pleural effusion. She will need left and right heart catheterization with careful attention to the severity of pulmonary hypertension and whether or not her PA pressures are responsive to changes in oxygen saturation and/or vasodilator therapy. Transesophageal echocardiogram would be helpful to further evaluate the severity and functional significance of the patient's mitral regurgitation and tricuspid regurgitation.  Formal pulmonary function tests should be repeated.  Getting the advanced heart failure team involved might be wise given that she will be dealing with chronic CHF for the remainder of her life.   I spent in excess of 120  minutes during the conduct of this hospital consultation and >50% of this time involved direct face-to-face encounter for counseling and/or coordination of the patient's care.    Valentina Gu. Roxy Manns, MD 09/13/2015 12:41 PM

## 2015-09-13 NOTE — Progress Notes (Signed)
Inpatient Diabetes Program Recommendations  AACE/ADA: New Consensus Statement on Inpatient Glycemic Control (2015)  Target Ranges:  Prepandial:   less than 140 mg/dL      Peak postprandial:   less than 180 mg/dL (1-2 hours)      Critically ill patients:  140 - 180 mg/dL  Results for Jillian Bright, Jillian Bright (MRN [id number]) as of 09/13/2015 13:35  Ref. Range 09/12/2015 06:57 09/12/2015 12:20 09/12/2015 16:38 09/12/2015 21:38 09/13/2015 06:09 09/13/2015 11:19  Glucose-Capillary Latest Ref Range: 65-99 mg/dL 211 (H) 211 (H) 212 (H) 273 (H) 181 (H) 225 (H)   Review of Glycemic Control   Current orders for Inpatient glycemic control: Novolog 0-9 units TID with meals, Novolog 0-5 units QHS  Inpatient Diabetes Program Recommendations: Insulin - Basal: Please consider ordering low dose basal insuiln; recommend starting with Lantus 5 units QHS. Insulin - Meal Coverage: Post prandial glucose is consistently elevated. Please consider ordering Novolog 3 units TID with meals for meal coverage.   Thanks, Barnie Alderman, RN, MSN, CDE Diabetes Coordinator Inpatient Diabetes Program [phone] (Team Pager from [place] to [place]) [phone] ([facility] office) [phone] [facility] office) [phone] [facility] office)

## 2015-09-13 NOTE — Progress Notes (Signed)
PATIENT ID:  Jillian Bright is a pleasant, 28F with coronary artery disease, chronic diastolic heart failure, diabetes, COPD, OSA unable to tolerate CPAP, pulmonary hypertension, and hyperlipidemia here with acute on chronic diastolic heart failure, RV failure, and newly diagnosed severe AS.    SUBJECTIVE:  Complains of tightness and swelling in her legs.   PHYSICAL EXAM Filed Vitals:   09/12/15 1221 09/12/15 2100 09/13/15 0413 09/13/15 0615  BP: 101/70 119/76  93/63  Pulse:  110  103  Temp:  98.9 F (37.2 C)  97.6 F (36.4 C)  TempSrc:  Oral  Oral  Resp:  18  16  Height:      Weight:   91.173 kg (201 lb)   SpO2:  93%  95%   Gen: Well-appearing. No acute distress Neck: JVP 14 cm  COR: RRR. III/VI late-peaking, systolic murmur at the LUSB. No r/g.  Lungs: Diminished breath sounds at bilateral bases. No crackles, wheezes or rhonchi.  Abd: Distended, obese. Nontender. Active bowel sounds.  Ext: Warm and well-perfused. 2+ pitting edema to above the knees bilaterally. Erythema on bilateral anterior tibia.   LABS: Lab Results  Component Value Date   TROPONINI 0.04* 09/08/2015   Results for orders placed or performed during the hospital encounter of 09/07/15 (from the past 24 hour(s))  Glucose, capillary     Status: Abnormal   Collection Time: 09/12/15 12:20 PM  Result Value Ref Range   Glucose-Capillary 211 (H) 65 - 99 mg/dL  Glucose, capillary     Status: Abnormal   Collection Time: 09/12/15  4:38 PM  Result Value Ref Range   Glucose-Capillary 212 (H) 65 - 99 mg/dL   Comment 1 Notify RN   Glucose, capillary     Status: Abnormal   Collection Time: 09/12/15  9:38 PM  Result Value Ref Range   Glucose-Capillary 273 (H) 65 - 99 mg/dL   Comment 1 Notify RN    Comment 2 Document in Chart   Basic metabolic panel     Status: Abnormal   Collection Time: 09/13/15  4:42 AM  Result Value Ref Range   Sodium 137 135 - 145 mmol/L   Potassium 4.5 3.5 - 5.1 mmol/L   Chloride  84 (L) 101 - 111 mmol/L   CO2 41 (H) 22 - 32 mmol/L   Glucose, Bld 202 (H) 65 - 99 mg/dL   BUN 29 (H) 6 - 20 mg/dL   Creatinine, Ser 1.12 (H) 0.44 - 1.00 mg/dL   Calcium 7.6 (L) 8.9 - 10.3 mg/dL   GFR calc non Af Amer 48 (L) >60 mL/min   GFR calc Af Amer 56 (L) >60 mL/min   Anion gap 12 5 - 15  CBC     Status: Abnormal   Collection Time: 09/13/15  4:42 AM  Result Value Ref Range   WBC 5.9 4.0 - 10.5 K/uL   RBC 4.37 3.87 - 5.11 MIL/uL   Hemoglobin 8.8 (L) 12.0 - 15.0 g/dL   HCT 34.0 (L) 36.0 - 46.0 %   MCV 77.8 (L) 78.0 - 100.0 fL   MCH 20.1 (L) 26.0 - 34.0 pg   MCHC 25.9 (L) 30.0 - 36.0 g/dL   RDW 20.5 (H) 11.5 - 15.5 %   Platelets 134 (L) 150 - 400 K/uL  Glucose, capillary     Status: Abnormal   Collection Time: 09/13/15  6:09 AM  Result Value Ref Range   Glucose-Capillary 181 (H) 65 - 99 mg/dL    Intake/Output Summary (  Last 24 hours) at 09/13/15 1056 Last data filed at 09/13/15 1000  Gross per 24 hour  Intake   1440 ml  Output    500 ml  Net    940 ml    Telemetry:  No events.  Sinus rhythm.  ASSESSMENT AND PLAN:  Active Problems:   Essential hypertension   COPD with emphysema (HCC)   CAD (coronary artery disease)   DM type 2 (diabetes mellitus, type 2) (HCC)   Acute on chronic diastolic CHF (congestive heart failure), NYHA class 1 (HCC)   On home oxygen therapy   Microcytic anemia   CHF (congestive heart failure) (HCC)   CHF exacerbation (HCC)   Abnormal chest x-ray   Acute on chronic congestive heart failure ([place])   # Pulmonary hypertension: # RV failure:  # Acute on chronic diastolic heart failure: Jillian Bright remains significantly volume overloaded.  On exam it seems mostly attributable to RV failure, as her lungs are clear but JVD significantly elevated.  She had mild RV failure on her last echo.  We will get a PICC line and start dopamine 2.66mg/kg/min along with lasix 60 mg bid.  If she gets a LHC for surgical evaluation, we will get a RHC as well.  I  suspect that if we continue to attempt diuresis without dopamine, we will continue to struggle with AKI.  # Moderate to severe AS: Jillian Bright likely not going to be a surgical candidate.  TAVR may be possible.  Awaiting CT surgery input.  Volume management as above.  # CAD: Continue aspirin, atorvastatin.  Annete Ayuso C. ROval Linsey MD, F[facility]4/18/2017 10:56 AM

## 2015-09-13 NOTE — Progress Notes (Signed)
PROGRESS NOTE    Jillian Bright  VOH:[id number] DOB: 02-Aug-1945 DOA: 09/07/2015 PCP: [name], MD  Outpatient Specialists:     Brief Narrative: 70 year old female with history of diabetes mellitus, COPD, sleep apnea, pulmonary hypertension, obesity, history of coronary artery disease, congestive heart failure, who presents to the ER with worsening bilateral lower extremity swelling and shortness of breath with orthopnea.Patient is known history of COPD Oxygen ,3 liters at baseline as well as history of diastolic heart failure last echogram was in 2015 showing EF of 69%-48 diastolic,chronic 1 diastolic heart failure and moderate aortic stenosis   Assessment & Plan:   Principal Problem:   Acute on chronic diastolic (congestive) heart failure (HCC) Active Problems:   Essential hypertension   Pulmonary HTN (HCC)   Chronic diastolic heart failure (HCC)   COPD with emphysema (HCC)   Obesity hypoventilation syndrome (HCC)   CAD (coronary artery disease)   Aortic stenosis   DM type 2 (diabetes mellitus, type 2) (HCC)   On home oxygen therapy   Microcytic anemia   CHF (congestive heart failure) (HCC)   CHF exacerbation (HCC)   Abnormal chest x-ray   Acute on chronic congestive heart failure ([place])   Morbid obesity ([place])   . Acute on chronic diastolic CHF (congestive heart failure), NYHA class 1 (HCC) - also likely secondary to noncompliance - patient admitted to not taking lasix prior to admission because she did not feel she needed it Continue telemetry Serial trop remained had stable at 0.04. Suspect troponin leak in the setting of acute CHF Repeat 2-D echo with EF 60-65% with mod-severe AS  Cont to hold off on ACE/ARBi due to renal insufficiency  Wt today 91.17kg<-90.8kg yesterday   Net neg 8.4L since admit Initially responded well to IV lasix, however developed worsening AKI and hypotension Cardiology consulted for assistance. Recs for dopamine with lasix  Severe AS  Valve area <1cm  Consulted CT Surgery Will need LHC and RHC if she goes to surgery, however, pt is likely not surgical candidate at this time  . CAD (coronary artery disease) chronic continue home medications  . Essential hypertension chronic continue home medications   . Microcytic anemia Plan to cont to transfuse for hemoglobin below 7 or if significantly symptomatic. Given IV iron. Pt is iron deficient . Patient will need ferrous sulfate on discharge and possible GI evaluation as outpatient - Hgb down to 6.9 on 4/16, appropriately responded to 2 units of PRBC's -Hgb remains stable  . COPD with emphysema ([place]) stable continue home oxygen and inhalers  Diabetes mellitus hold by mouth medications order sliding scale, cont low-dose Lantus  Leg edema suspect likely secondary to heart failure. LE dopplers neg for DVT  Constipation: Reports no BM for one week. Pt now having BM with cathartics.  DVT prophylaxis: SCD's Code Status:DNR Family Communication: Patient in room Disposition Plan: SNF - uncertain when   Consultants:   CT surgery  Cardiology  Procedures:     Antimicrobials:    Subjective: Complains of continued LE swelling  Objective: Filed Vitals:   09/12/15 1221 09/12/15 2100 09/13/15 0413 09/13/15 0615  BP: 101/70 119/76  93/63  Pulse:  110  103  Temp:  98.9 F (37.2 C)  97.6 F (36.4 C)  TempSrc:  Oral  Oral  Resp:  18  16  Height:      Weight:   91.173 kg (201 lb)   SpO2:  93%  95%    Intake/Output Summary (Last 24 hours)  at 09/13/15 1218 Last data filed at 09/13/15 1000  Gross per 24 hour  Intake   1440 ml  Output    500 ml  Net    940 ml   Filed Weights   09/11/15 0437 09/12/15 0558 09/13/15 0413  Weight: 89.676 kg (197 lb 11.2 oz) 90.855 kg (200 lb 4.8 oz) 91.173 kg (201 lb)    Examination:  General exam: Appears calm and comfortable, sitting in chair  Respiratory system: Clear to auscultation. Respiratory effort  normal. Cardiovascular system: S1 & S2 heard, RRR. No JVD, murmurs, rubs, gallops or clicks.  Gastrointestinal system: Abdomen is nontender. No organomegaly or masses felt. Posl bowel sounds Central nervous system: Alert and oriented. No focal neurological deficits. Extremities: Symmetric 5 x 5 power. B LE pitting edema Skin: No rashes, lesions or ulcers Psychiatry: Judgement and insight appear normal. Mood & affect appropriate.     Data Reviewed: I have personally reviewed following labs and imaging studies  CBC:  Recent Labs Lab 09/07/15 2000 09/09/15 0359 09/11/15 0325 09/12/15 0302 09/13/15 0442  WBC 6.7 5.3 6.1 6.2 5.9  NEUTROABS 4.1  --   --   --   --   HGB 8.0* 7.2* 6.8* 8.9* 8.8*  HCT 30.4* 29.3* 27.5* 33.5* 34.0*  MCV 72.4* 73.3* 72.8* 76.7* 77.8*  PLT 194 162 148* 134* 638*   Basic Metabolic Panel:  Recent Labs Lab 09/09/15 0359 09/10/15 0250 09/11/15 0325 09/12/15 0302 09/13/15 0442  NA 139 137 136 134* 137  K 5.0 3.7 4.0 4.1 4.5  CL 91* 87* 84* 81* 84*  CO2 37* 38* 42* 41* 41*  GLUCOSE 234* 250* 212* 220* 202*  BUN 29* 26* 24* 27* 29*  CREATININE 1.41* 1.10* 1.28* 1.25* 1.12*  CALCIUM 8.3* 7.4* 6.9* 7.0* 7.6*   GFR: Estimated Creatinine Clearance: 52.4 mL/min (by C-G formula based on Cr of 1.12). Liver Function Tests:  Recent Labs Lab 09/09/15 0359  AST 28  ALT 24  ALKPHOS 54  BILITOT 1.1  PROT 6.6  ALBUMIN 3.5   No results for input(s): LIPASE, AMYLASE in the last 168 hours. No results for input(s): AMMONIA in the last 168 hours. Coagulation Profile: No results for input(s): INR, PROTIME in the last 168 hours. Cardiac Enzymes:  Recent Labs Lab 09/07/15 2000 09/08/15 0029 09/08/15 0713 09/08/15 1250  TROPONINI 0.04* 0.04* 0.04* 0.04*   BNP (last 3 results) No results for input(s): PROBNP in the last 8760 hours. HbA1C: No results for input(s): HGBA1C in the last 72 hours. CBG:  Recent Labs Lab 09/12/15 1220 09/12/15 1638  09/12/15 2138 09/13/15 0609 09/13/15 1119  GLUCAP 211* 212* 273* 181* 225*   Lipid Profile: No results for input(s): CHOL, HDL, LDLCALC, TRIG, CHOLHDL, LDLDIRECT in the last 72 hours. Thyroid Function Tests: No results for input(s): TSH, T4TOTAL, FREET4, T3FREE, THYROIDAB in the last 72 hours. Anemia Panel: No results for input(s): VITAMINB12, FOLATE, FERRITIN, TIBC, IRON, RETICCTPCT in the last 72 hours. Urine analysis:    Component Value Date/Time   COLORURINE YELLOW 09/08/2015 [place] 09/08/2015 0137   LABSPEC 1.009 09/08/2015 0137   PHURINE 5.0 09/08/2015 0137   GLUCOSEU NEGATIVE 09/08/2015 0137   HGBUR NEGATIVE 09/08/2015 0137   HGBUR negative 08/04/2008 1526   BILIRUBINUR NEGATIVE 09/08/2015 0137   BILIRUBINUR n 11/24/2012 1335   KETONESUR NEGATIVE 09/08/2015 0137   PROTEINUR NEGATIVE 09/08/2015 0137   PROTEINUR trace 11/24/2012 1335   UROBILINOGEN 0.2 11/18/2014 0750   UROBILINOGEN 0.2  11/24/2012 1335   NITRITE NEGATIVE 09/08/2015 0137   NITRITE n 11/24/2012 1335   LEUKOCYTESUR NEGATIVE 09/08/2015 0137   Sepsis Labs: '@LABRCNTIP'$ (procalcitonin:4,lacticidven:4)  )No results found for this or any previous visit (from the past 240 hour(s)).       Radiology Studies: No results found.      Scheduled Meds: . sodium chloride   Intravenous Once  . aspirin  81 mg Oral Daily  . atorvastatin  20 mg Oral Daily  . darifenacin  7.5 mg Oral Daily  . DULoxetine  60 mg Oral Daily  . gabapentin  300 mg Oral TID  . insulin aspart  0-5 Units Subcutaneous QHS  . insulin aspart  0-9 Units Subcutaneous TID WC  . mometasone-formoterol  2 puff Inhalation BID  . pantoprazole  40 mg Oral Daily  . polyethylene glycol  17 g Oral Daily  . sodium chloride flush  3 mL Intravenous Q12H  . traZODone  50 mg Oral QHS   Continuous Infusions: . DOPamine       LOS: 6 days    CHIU, Orpah Melter, MD [facility] Pager [phone]  If 7PM-7AM, please  contact night-coverage [email or web address] Password [facility] 09/13/2015, 12:18 PM

## 2015-09-14 ENCOUNTER — Other Ambulatory Visit (HOSPITAL_COMMUNITY): Payer: Self-pay | Admitting: Respiratory Therapy

## 2015-09-14 ENCOUNTER — Telehealth: Payer: Self-pay | Admitting: Family Medicine

## 2015-09-14 DIAGNOSIS — I350 Nonrheumatic aortic (valve) stenosis: Secondary | ICD-10-CM

## 2015-09-14 DIAGNOSIS — J432 Centrilobular emphysema: Secondary | ICD-10-CM

## 2015-09-14 DIAGNOSIS — R911 Solitary pulmonary nodule: Secondary | ICD-10-CM

## 2015-09-14 DIAGNOSIS — J948 Other specified pleural conditions: Secondary | ICD-10-CM

## 2015-09-14 DIAGNOSIS — I272 Other secondary pulmonary hypertension: Secondary | ICD-10-CM

## 2015-09-14 DIAGNOSIS — I5033 Acute on chronic diastolic (congestive) heart failure: Secondary | ICD-10-CM

## 2015-09-14 DIAGNOSIS — E662 Morbid (severe) obesity with alveolar hypoventilation: Secondary | ICD-10-CM

## 2015-09-14 LAB — BLOOD GAS, ARTERIAL
Acid-Base Excess: 16.6 mmol/L — ABNORMAL HIGH (ref 0.0–2.0)
Bicarbonate: 42.9 mEq/L — ABNORMAL HIGH (ref 20.0–24.0)
Drawn by: 290171
O2 Content: 4 L/min
O2 Saturation: 74.1 %
PCO2 ART: 77.1 mmHg — AB (ref 35.0–45.0)
PH ART: 7.364 (ref 7.350–7.450)
PO2 ART: 43 mmHg — AB (ref 80.0–100.0)
Patient temperature: 98.6
TCO2: 45.2 mmol/L (ref 0–100)

## 2015-09-14 LAB — GLUCOSE, CAPILLARY
GLUCOSE-CAPILLARY: 177 mg/dL — AB (ref 65–99)
GLUCOSE-CAPILLARY: 206 mg/dL — AB (ref 65–99)
GLUCOSE-CAPILLARY: 166 mg/dL — AB (ref 65–99)
Glucose-Capillary: 173 mg/dL — ABNORMAL HIGH (ref 65–99)
Glucose-Capillary: 176 mg/dL — ABNORMAL HIGH (ref 65–99)

## 2015-09-14 LAB — COMPREHENSIVE METABOLIC PANEL
ALK PHOS: 53 U/L (ref 38–126)
ALT: 17 U/L (ref 14–54)
AST: 23 U/L (ref 15–41)
Albumin: 3.3 g/dL — ABNORMAL LOW (ref 3.5–5.0)
Anion gap: 8 (ref 5–15)
BUN: 26 mg/dL — AB (ref 6–20)
CALCIUM: 8.3 mg/dL — AB (ref 8.9–10.3)
CO2: 41 mmol/L — ABNORMAL HIGH (ref 22–32)
CREATININE: 1.08 mg/dL — AB (ref 0.44–1.00)
Chloride: 86 mmol/L — ABNORMAL LOW (ref 101–111)
GFR, EST AFRICAN AMERICAN: 58 mL/min — AB (ref >60)
GFR, EST NON AFRICAN AMERICAN: 50 mL/min — AB (ref >60)
Glucose, Bld: 165 mg/dL — ABNORMAL HIGH (ref 65–99)
Potassium: 4.4 mmol/L (ref 3.5–5.1)
Sodium: 135 mmol/L (ref 135–145)
Total Bilirubin: 1.2 mg/dL (ref 0.3–1.2)
Total Protein: 6.7 g/dL (ref 6.5–8.1)

## 2015-09-14 LAB — PREALBUMIN: PREALBUMIN: 13.3 mg/dL — AB (ref 18–38)

## 2015-09-14 MED ORDER — IPRATROPIUM BROMIDE 0.02 % IN SOLN
0.5000 mg | Freq: Once | RESPIRATORY_TRACT | Status: AC
  Administered 2015-09-14: 0.5 mg via RESPIRATORY_TRACT
  Filled 2015-09-14: qty 2.5

## 2015-09-14 MED ORDER — IPRATROPIUM-ALBUTEROL 0.5-2.5 (3) MG/3ML IN SOLN
3.0000 mL | Freq: Four times a day (QID) | RESPIRATORY_TRACT | Status: DC | PRN
Start: 2015-09-14 — End: 2015-09-28

## 2015-09-14 MED ORDER — SPIRONOLACTONE 25 MG PO TABS
12.5000 mg | ORAL_TABLET | Freq: Every day | ORAL | Status: DC
  Administered 2015-09-14 – 2015-09-18 (×5): 12.5 mg via ORAL
  Filled 2015-09-14 (×5): qty 1

## 2015-09-14 MED ORDER — POTASSIUM CHLORIDE CRYS ER 20 MEQ PO TBCR: 20.0000 meq | EXTENDED_RELEASE_TABLET | Freq: Every day | ORAL | Status: DC

## 2015-09-14 MED ORDER — FUROSEMIDE 10 MG/ML IJ SOLN
80.0000 mg | Freq: Two times a day (BID) | INTRAMUSCULAR | Status: DC
  Administered 2015-09-14 – 2015-09-18 (×10): 80 mg via INTRAVENOUS
  Filled 2015-09-14 (×11): qty 8

## 2015-09-14 MED ORDER — ALBUTEROL SULFATE (2.5 MG/3ML) 0.083% IN NEBU
5.0000 mg | INHALATION_SOLUTION | Freq: Once | RESPIRATORY_TRACT | Status: AC
  Administered 2015-09-14: 5 mg via RESPIRATORY_TRACT
  Filled 2015-09-14: qty 6

## 2015-09-14 MED ORDER — OXYCODONE-ACETAMINOPHEN 5-325 MG PO TABS
1.0000 | ORAL_TABLET | Freq: Four times a day (QID) | ORAL | Status: DC | PRN
Start: 2015-09-14 — End: 2015-09-15
  Administered 2015-09-14 – 2015-09-15 (×3): 1 via ORAL
  Filled 2015-09-14 (×3): qty 1

## 2015-09-14 MED ORDER — SODIUM CHLORIDE 0.9% FLUSH
10.0000 mL | INTRAVENOUS | Status: DC | PRN
Start: 2015-09-14 — End: 2015-09-28
  Administered 2015-09-14 – 2015-09-17 (×4): 10 mL
  Administered 2015-09-27: 20 mL
  Filled 2015-09-14 (×5): qty 40

## 2015-09-14 NOTE — Telephone Encounter (Signed)
Dr. Sarajane Jews is aware.

## 2015-09-14 NOTE — Progress Notes (Signed)
Peripherally Inserted Central Catheter/Midline Placement  The IV Nurse has discussed with the patient and/or persons authorized to consent for the patient, the purpose of this procedure and the potential benefits and risks involved with this procedure.  The benefits include less needle sticks, lab draws from the catheter and patient may be discharged home with the catheter.  Risks include, but not limited to, infection, bleeding, blood clot (thrombus formation), and puncture of an artery; nerve damage and irregular heat beat.  Alternatives to this procedure were also discussed.  PICC/Midline Placement Documentation        Jillian Bright 09/14/2015, 11:04 AM

## 2015-09-14 NOTE — Consult Note (Signed)
Name: Jillian Bright MRN: [id number] DOB: Dec 30, 1945    ADMISSION DATE:  09/07/2015 CONSULTATION DATE:  4/19  REFERRING MD :  [facility]   CHIEF COMPLAINT:  Lung nodule   BRIEF PATIENT DESCRIPTION: 70yo morbidly obese female former smoker (quit 2009) with multiple medical problems including COPD, OSA (intolerant to CPAP), Pulmonary HTN (PA peak pressure 61 mmHg), dCHF, Severe AS, CAD, chronic respiratory failure on home O2 initially presented 4/12 with 3 week hx BLE edema, SOB, orthopnea.  She was admitted, diuresed and seen in consultation by CVTS for possible aortic valve repair/replacement.  CTA chest to r/o PE revealed enlarging LUL pulmonary nodule and PCCM consulted.   SIGNIFICANT EVENTS    STUDIES:  CTA chest 4/19>>>Negative for pulmonary embolus.  Left upper lobe pulmonary nodule has enlarged since the prior CT scan and could be a bronchogenic carcinoma. PET CT scan is recommended for further evaluation.  Moderate right pleural effusion with associated compressive atelectasis. 2D echo 4/13>>> 66-06%, grade 1 diastolic dysfunction, severe AS, mild MR, severely dilated RA, PA peak pressure 80mHg  HISTORY OF PRESENT ILLNESS:  69yo morbidly obese female former smoker (quit 2009) with multiple medical problems including COPD, OSA (intolerant to CPAP), Pulmonary HTN (PA peak pressure 61 mmHg), dCHF, Severe AS, CAD, chronic respiratory failure on home O2 initially presented 4/12 with 3 week hx BLE edema, SOB, orthopnea.  She was admitted, diuresed and seen in consultation by CVTS for possible aortic valve repair/replacement.  CTA chest to r/o PE revealed enlarging LUL pulmonary nodule and PCCM consulted.   Former smoker -- 72 pack year smoking hx! 2 ppd from age 70 quit 2011.    Currently c/o BLE edema and pain, mild dyspnea but mostly DOE.  Denies cough, purulent sputum, fever, hemoptysis, chest pain, weight loss.     PAST MEDICAL HISTORY :   has a past medical history of Diabetes  mellitus; COPD (chronic obstructive pulmonary disease) ([place]; Sleep apnea, obstructive; Pulmonary HTN ([place]; Obesity (BMI 30.0-34.9); Hyperlipidemia; Myocardial infarction ([place]; Coronary artery disease; Anxiety; Shortness of breath; GERD (gastroesophageal reflux disease); Arthritis; CHF (congestive heart failure) ([place]; Pneumonia; Bronchitis; History of hiatal hernia; Acute on chronic diastolic (congestive) heart failure ([place] (09/07/2015); Aortic stenosis (01/14/2012); Pleural effusion on right (09/07/2015); Tricuspid regurgitation; Right-sided congestive heart failure ([place]; and Mitral regurgitation.  has past surgical history that includes Appendectomy; Tonsillectomy and adenoidectomy; Lumbar disc surgery; Hemorrhoid surgery; Coronary angioplasty; Endarterectomy (Left, 12/04/2012); Patch angioplasty (Left, 12/04/2012); Eye surgery; colonscopy ; and Total hip arthroplasty (Left, 07/30/2014). Prior to Admission medications   Medication Sig Start Date End Date Taking? Authorizing Provider  albuterol (PROVENTIL HFA;VENTOLIN HFA) 108 (90 BASE) MCG/ACT inhaler Inhale 2 puffs into the lungs every 6 (six) hours as needed for wheezing or shortness of breath.   Yes Historical Provider, MD  ALPRAZolam (XANAX) 0.5 MG tablet TAKE 1 TABLET BY MOUTH 3 TIMES A DAY AS NEEDED Patient taking differently: TAKE 1 TABLET BY MOUTH 3 TIMES A DAY AS NEEDED anxiety 04/01/15  Yes SLaurey Morale MD  atorvastatin (LIPITOR) 20 MG tablet Take 1 tablet (20 mg total) by mouth daily. 12/07/14  Yes SLaurey Morale MD  budesonide-formoterol ([facility] 160-4.5 MCG/ACT inhaler Inhale 2 puffs into the lungs 2 (two) times daily. 10/26/13  Yes Tammy S Parrett, NP  DULoxetine (CYMBALTA) 60 MG capsule Take 1 capsule (60 mg total) by mouth daily. 12/07/14  Yes SLaurey Morale MD  gabapentin (NEURONTIN) 300 MG capsule TAKE ONE CAPSULE 3 TIMES A DAY 05/24/15  Yes [name], MD  glipiZIDE (GLUCOTROL) 10 MG tablet TAKE 1 TABLET (10 MG TOTAL) BY MOUTH 2  (TWO) TIMES DAILY BEFORE A MEAL. 06/20/14  Yes [name], MD  metFORMIN (GLUCOPHAGE) 500 MG tablet Take 1 tablet (500 mg total) by mouth 3 (three) times daily. 12/07/14  Yes [name], MD  omeprazole (PRILOSEC) 40 MG capsule Take 1 capsule (40 mg total) by mouth daily before breakfast. 12/07/14  Yes [name], MD  OVER THE COUNTER MEDICATION Place 1-2 drops into both eyes daily as needed (dry eyes.). Soothe.   Yes Historical Provider, MD  sitaGLIPtin (JANUVIA) 100 MG tablet Take 1 tablet (100 mg total) by mouth daily. 06/04/14  Yes [name], MD  solifenacin (VESICARE) 10 MG tablet Take 1 tablet (10 mg total) by mouth daily. 06/04/14  Yes [name], MD  spironolactone (ALDACTONE) 25 MG tablet Take 1 tablet (25 mg total) by mouth every morning. 12/07/14  Yes [name], MD  traZODone (DESYREL) 50 MG tablet Take 1 tablet (50 mg total) by mouth at bedtime. 12/07/14  Yes [name], MD  vitamin B-12 (CYANOCOBALAMIN) 1000 MCG tablet Take 1 tablet (1,000 mcg total) by mouth daily. 11/19/14  Yes Delfina Redwood, MD   Allergies  Allergen Reactions  . Codeine Itching    Takes vicodin at home    FAMILY HISTORY:  family history includes Asthma in her maternal grandfather; Cancer in her brother and mother; Heart attack in her father and mother; Hyperlipidemia in her mother; Hypertension in her mother; Stroke in her mother. SOCIAL HISTORY:  reports that she quit smoking about 2 years ago. Her smoking use included Cigarettes. She has a 72 pack-year smoking history. She has never used smokeless tobacco. She reports that she drinks alcohol. She reports that she does not use illicit drugs.  REVIEW OF SYSTEMS:   As per HPI - All other systems reviewed and were neg.    SUBJECTIVE:   VITAL SIGNS: Temp:  [98.1 F (36.7 C)-98.4 F (36.9 C)] 98.4 F (36.9 C) (04/19 1228) Pulse Rate:  [93-110] 106 (04/19 1228) Resp:  [18-20] 18 (04/19 1228) BP: (99-115)/(59-70) 115/68 mmHg (04/19  1228) SpO2:  [88 %-95 %] 94 % (04/19 1230) Weight:  [92.216 kg (203 lb 4.8 oz)] 92.216 kg (203 lb 4.8 oz) (04/19 0233)  PHYSICAL EXAMINATION: General:  Chronically ill appearing female, NAD sitting OOB in chair eating lunch  Neuro:  Awake, alert, appropriate, MAE  HEENT:  Mm moist, no JVD  Cardiovascular:  s1s2 rrr Lungs:  resps even non labored at rest on 3L [place], no audible wheeze, bibasilar crackles  Abdomen:  Round, soft, +bs  Musculoskeletal:  Warm and dry, 3+ BLE edema, erythema     Recent Labs Lab 09/12/15 0302 09/13/15 0442 09/14/15 0402  NA 134* 137 135  K 4.1 4.5 4.4  CL 81* 84* 86*  CO2 41* 41* 41*  BUN 27* 29* 26*  CREATININE 1.25* 1.12* 1.08*  GLUCOSE 220* 202* 165*    Recent Labs Lab 09/11/15 0325 09/12/15 0302 09/13/15 0442  HGB 6.8* 8.9* 8.8*  HCT 27.5* 33.5* 34.0*  WBC 6.1 6.2 5.9  PLT 148* 134* 134*   Ct Angio Chest Pe W/cm &/or Wo Cm  09/13/2015  CLINICAL DATA:  Shortness of breath for 2 months. Subsequent encounter. EXAM: CT ANGIOGRAPHY CHEST WITH CONTRAST TECHNIQUE: Multidetector CT imaging of the chest was performed using the standard protocol during bolus administration of intravenous  contrast. Multiplanar CT image reconstructions and MIPs were obtained to evaluate the vascular anatomy. CONTRAST:  100 cc Isovue 370. COMPARISON:  Single view of the chest 11/17/2014. PA and lateral chest 09/08/2015. FINDINGS: No pulmonary embolus is identified. The patient has a moderate right pleural effusion. There is no left pleural effusion or pericardial effusion. Marked cardiomegaly is noted. Calcific aortic and coronary atherosclerosis is seen. No axillary, hilar or mediastinal lymphadenopathy by CT size criteria is seen. 0.9 cm prevascular node on image 36 is noted. Lungs demonstrate emphysematous disease. A 1.1 cm AP x 0.9 cm transverse x 1.5 cm craniocaudal nodule on image 20 of series 6 and coronal image 83 measured 1.0 x 0.6 x 0.8 cm on the prior CT. The nodule  is lobulated. The patient's right pleural effusion results in compressive atelectatic change. The lungs are otherwise unremarkable. No focal abnormality is seen in the imaged upper abdomen. No worrisome bony lesion is seen. Hemangiomas and upper thoracic vertebral bodies are unchanged. Review of the MIP images confirms the above findings. IMPRESSION: Negative for pulmonary embolus. Left upper lobe pulmonary nodule has enlarged since the prior CT scan and could be a bronchogenic carcinoma. PET CT scan is recommended for further evaluation. Moderate right pleural effusion with associated compressive atelectasis. Marked cardiomegaly. Calcific aortic and coronary atherosclerosis. Electronically Signed   By: Inge Rise M.D.   On: 09/13/2015 15:58    ASSESSMENT / PLAN:  LUL pulmonary nodule (11.6m x 8.710m - slightly increased from previous CT scan in 2015 COPD  OSA - intolerant to CPAP - followed by Dr. SoHalford ChessmanSecondary PACiss[place] effusion with associated RLL collapse  REC -  Continue diuresis per cards  Consider a R thoracentesis for therapeutic benefit given her acute on chronic resp failure Continue dulera, PRN SABA  Pulmonary hygiene  Supplemental O2, wean as able She would benefit from CPAP but is unable to tolerate She is certainly at increased risk for peri-operative pulmonary complications including inability to wean from the ventilator post op, but would favor proceeding with the remainder of the w/u for her aortic valve repair (including cath, TEE, etc).  Once pt is improved and can be d/c would do outpt PET scan to assist with risk stratification of LUL nodule and possible contribution to staging if this is a primary lung cancer.   D/w Dr. LaDarrick Meigs   KaNickolas MadridNP 09/14/2015  2:31 PM Pager: [phone] Attending Note:  I have examined patient, reviewed labs, studies and notes. I have discussed the case with K Shon Milletand I agree with the data and plans  as amended above. The workup to assess her for potential benefit or ability to tolerate AV repair is ongoing. Not clear to me that she would be able to undergo based on her underlying lung disease, secondary PAH and the inherent risk. The LUL pulmonary nodule is high suspicion for a primary lung cancer. The workup of this nodule is complicated by the fact that navigational bronchoscopy would need to be done under general anesthesia - high risk due to her PAH, valvular disease. She may be a candidate at some point for transthoracic needle bx, but this would carry high PTX and bleeding risk. I believe that most prudent course would be to perform PET scan as an outpatient, use this information to gauge suspicion and to look for distant disease. If the nodule is hypermetabolic and this is stage 1 disease then she might be a  candidate for stereotactic radiation even without a tissue dx. Would need to discuss this with Radiation Oncology. I do not believe she should go straight to navigational FOB based on her anesthesia risk and bleeding risk. We will follow her outpt to interpret the PET and to help plan next appropriate steps. Will need to coordinate with TCTS as eval of cardiac status proceeds. I have discussed with Dr Roxy Manns this afternoon.    Baltazar Apo, MD, PhD 09/14/2015, 4:56 PM [facility] [phone] or if no answer [phone]

## 2015-09-14 NOTE — Progress Notes (Signed)
Advanced Heart Failure Rounding Note   Subjective:   70 year old female history of coronary artery disease, congestive heart failure, diabetes mellitus, oxygen dependent COPD, obstructive sleep apnea-intolerant to CPAP, pulmonary hypertension, obesity, hyperlipidemia. She was last seen by Dr. Aundra Dubin in August 2013. She initially presented with peripheral edema in 2009 and had a normal stress test and preserved EF on echocardiogram. She had a right heart catheterization which showed mild/moderate pulmonary hypertension which responded well to oxygen with decreased PA pressure. She had 2-D echocardiogram on 09/08/2015 revealing normal ejection fraction of 60-65% and wall motion. Grade 1 diastolic dysfunction. Moderate to severe aortic stenosis where previously was moderate. Mild MR. Left atrium mildly dilated. Peak PA pressure 61 mmHg. Right atrium moderately to severely dilated. Moderate tricuspid valve regurgitation.  Yesterday started on dopamine to assist with diuresis. She has not had IV lasix since 4/17.  Complaining of leg fatigue and shortness of breath.    09/13/15 CTA: negative for PE, enlarging lung nodule noted.  Objective:   Weight Range:  Vital Signs:   Temp:  [98 F (36.7 C)-98.1 F (36.7 C)] 98.1 F (36.7 C) (04/18 2220) Pulse Rate:  [93-110] 110 (04/19 0155) Resp:  [18-20] 20 (04/18 2220) BP: (99-104)/(59-70) 100/68 mmHg (04/19 0155) SpO2:  [93 %-98 %] 95 % (04/19 0748) Weight:  [203 lb 4.8 oz (92.216 kg)] 203 lb 4.8 oz (92.216 kg) (04/19 0233) Last BM Date: 09/12/15  Weight change: Filed Weights   09/12/15 0558 09/13/15 0413 09/14/15 0233  Weight: 200 lb 4.8 oz (90.855 kg) 201 lb (91.173 kg) 203 lb 4.8 oz (92.216 kg)    Intake/Output:   Intake/Output Summary (Last 24 hours) at 09/14/15 1011 Last data filed at 09/14/15 0927  Gross per 24 hour  Intake    840 ml  Output   1450 ml  Net   -610 ml     Physical Exam: General: NAD. No resp difficulty.  Sitting in the chair HEENT: normal Neck: supple. JVP 14-16 cm. Carotids 2+ bilat; no bruits. No lymphadenopathy or thryomegaly appreciated. Cor: PMI nondisplaced. Regular rate & rhythm. No rubs, gallops.  3/6 crescendo-decrescendo murmur RUSB with muffled S2. Lungs: RML RLL decreased.  Abdomen: soft, nontender, nondistended. No hepatosplenomegaly. No bruits or masses. Good bowel sounds. Extremities: no cyanosis, clubbing, rash, R and LLE 3+ edema Erythema noted RLE LLE Pink  Neuro: alert & orientedx3, cranial nerves grossly intact. moves all 4 extremities w/o difficulty. Affect pleasant  Telemetry: ST 100s  Labs: Basic Metabolic Panel:  Recent Labs Lab 09/10/15 0250 09/11/15 0325 09/12/15 0302 09/13/15 0442 09/14/15 0402  NA 137 136 134* 137 135  K 3.7 4.0 4.1 4.5 4.4  CL 87* 84* 81* 84* 86*  CO2 38* 42* 41* 41* 41*  GLUCOSE 250* 212* 220* 202* 165*  BUN 26* 24* 27* 29* 26*  CREATININE 1.10* 1.28* 1.25* 1.12* 1.08*  CALCIUM 7.4* 6.9* 7.0* 7.6* 8.3*    Liver Function Tests:  Recent Labs Lab 09/09/15 0359 09/14/15 0402  AST 28 23  ALT 24 17  ALKPHOS 54 53  BILITOT 1.1 1.2  PROT 6.6 6.7  ALBUMIN 3.5 3.3*   No results for input(s): LIPASE, AMYLASE in the last 168 hours. No results for input(s): AMMONIA in the last 168 hours.  CBC:  Recent Labs Lab 09/07/15 2000 09/09/15 0359 09/11/15 0325 09/12/15 0302 09/13/15 0442  WBC 6.7 5.3 6.1 6.2 5.9  NEUTROABS 4.1  --   --   --   --  HGB 8.0* 7.2* 6.8* 8.9* 8.8*  HCT 30.4* 29.3* 27.5* 33.5* 34.0*  MCV 72.4* 73.3* 72.8* 76.7* 77.8*  PLT 194 162 148* 134* 134*    Cardiac Enzymes:  Recent Labs Lab 09/07/15 2000 09/08/15 0029 09/08/15 0713 09/08/15 1250  TROPONINI 0.04* 0.04* 0.04* 0.04*    BNP: BNP (last 3 results)  Recent Labs  11/18/14 1015 09/07/15 2000  BNP 176.5* 848.6*    ProBNP (last 3 results) No results for input(s): PROBNP in the last 8760 hours.    Other results:  Imaging: Ct  Angio Chest Pe W/cm &/or Wo Cm  09/13/2015  CLINICAL DATA:  Shortness of breath for 2 months. Subsequent encounter. EXAM: CT ANGIOGRAPHY CHEST WITH CONTRAST TECHNIQUE: Multidetector CT imaging of the chest was performed using the standard protocol during bolus administration of intravenous contrast. Multiplanar CT image reconstructions and MIPs were obtained to evaluate the vascular anatomy. CONTRAST:  100 cc Isovue 370. COMPARISON:  Single view of the chest 11/17/2014. PA and lateral chest 09/08/2015. FINDINGS: No pulmonary embolus is identified. The patient has a moderate right pleural effusion. There is no left pleural effusion or pericardial effusion. Marked cardiomegaly is noted. Calcific aortic and coronary atherosclerosis is seen. No axillary, hilar or mediastinal lymphadenopathy by CT size criteria is seen. 0.9 cm prevascular node on image 36 is noted. Lungs demonstrate emphysematous disease. A 1.1 cm AP x 0.9 cm transverse x 1.5 cm craniocaudal nodule on image 20 of series 6 and coronal image 83 measured 1.0 x 0.6 x 0.8 cm on the prior CT. The nodule is lobulated. The patient's right pleural effusion results in compressive atelectatic change. The lungs are otherwise unremarkable. No focal abnormality is seen in the imaged upper abdomen. No worrisome bony lesion is seen. Hemangiomas and upper thoracic vertebral bodies are unchanged. Review of the MIP images confirms the above findings. IMPRESSION: Negative for pulmonary embolus. Left upper lobe pulmonary nodule has enlarged since the prior CT scan and could be a bronchogenic carcinoma. PET CT scan is recommended for further evaluation. Moderate right pleural effusion with associated compressive atelectasis. Marked cardiomegaly. Calcific aortic and coronary atherosclerosis. Electronically Signed   By: Inge Rise M.D.   On: 09/13/2015 15:58      Medications:     Scheduled Medications: . sodium chloride   Intravenous Once  . aspirin  81 mg Oral  Daily  . atorvastatin  20 mg Oral Daily  . darifenacin  7.5 mg Oral Daily  . DULoxetine  60 mg Oral Daily  . gabapentin  300 mg Oral TID  . insulin aspart  0-5 Units Subcutaneous QHS  . insulin aspart  0-9 Units Subcutaneous TID WC  . insulin aspart  3 Units Subcutaneous TID WC  . insulin glargine  5 Units Subcutaneous QHS  . mometasone-formoterol  2 puff Inhalation BID  . pantoprazole  40 mg Oral Daily  . polyethylene glycol  17 g Oral Daily  . sodium chloride flush  3 mL Intravenous Q12H  . traZODone  50 mg Oral QHS     Infusions: . DOPamine 2.5 mcg/kg/min (09/13/15 1604)     PRN Medications:  sodium chloride, acetaminophen, ALPRAZolam, ipratropium-albuterol, ondansetron (ZOFRAN) IV, sodium chloride flush, traMADol   Assessment/Plan/Discussion    1. Pulmonary hypertension: On ECHO EF 60-65%. Peak PA pressure 61 mm hg, Mod TR RA severely dilated mild MR Does have OSA but intolerant CPAP. Using 3 liters continuous oxygen. CTA negative for PE. Hold off on PFTs until fully diurese. Would benefit  from Peter once fully diuresed.  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 Acute on chronic diastolic heart failure: NYHA IV - Marked volume overload. Stop dopamine. Needs aggressive diuresis. Start 80 mg IV lasix twice a day. Renal function stable. Add 12.5 mg spiro daily. Follow BMET closely. Place PICC .  4.  Moderate to severe AS:  CT surgery consulted for consideration TAVR. Marland Kitchen Recommendations for CT angiogram, PFTs, TEE to further assess MR/TR.  5. CAD: Continue aspirin, atorvastatin. 6. R pleural effusion- Mod on CXR. May need thoracentesis if doesn't improve with diuresis.   Length of Stay: 7  Amy Clegg NP-C  09/14/2015, 10:11 AM  Advanced Heart Failure Team Pager [phone] (M-F; [place])  Please contact [facility] for night-coverage after hours (4p -7a ) and weekends on [email or web address]  Patient seen with NP, agree with the above note.  There are a number of issues here.  1. Acute on  chronic diastolic CHF: With prominent RV failure in the setting of OHS/OSA.  Echo 4/17 with EF 60-65%, dilated and dysfunctional RV, PASP 61 mmHg, moderate to severe AS with mean gradient 34 and AVA 0.9 cm^2, moderate to severe TR.  On exam, she is markedly volume overloaded.  She needs extensive diuresis.  - Place PICC to follow CVP (transfer to step down 2H).  - Start Lasix 80 mg IV bid.   - Do not think that we need dopamine, can stop.  - RHC when more fully diuresed.  2. Aortic stenosis: Possibly severe.  Clearly progressed from prior echo (several years ago).  She will need TEE to assess more closely.  Will hold off on this until she is fully diuresed as will also do RHC/LHC.  Treatment of the aortic valve will be tricky.  Anesthesia will be quite risky given her pulmonary disease (COPD and OHS/OSA).  I think that she will do better with TAVR than with open AVR.  There is concern that TAVR will not address the TR, however this may improve with diuresis and decreased RVEDP.  3. Pulmonary hypertension: She has a history of pulmonary hypertension thought to be due to OHS/OSA.  Suspect her [place] currently is a combination of OHS/OSA and pulmonary venous hypertension from volume overload.  - Will diurese, then RHC.  4. OHS/OSA: Cannot tolerate CPAP at night.  She is on home O2 chronically.  5. COPD: On home O2, no longer smokes.  6. CAD: h/o RCA PCI.  Will need LHC with RHC prior to any consideration of valve intervention. Will do this after she is more fully diuresed. Continue ASA 81 and statin.  7. Tricuspid regurgitation: Moderate to severe.  Will reassess by TEE after more diuresis.  This may improve with diuresis.  8. Pulmonary nodule: Enlarging pulmonary nodule concerning for bronchogenic carcinoma, especially with smoking history.  This will likely have to be addressed after her heart issues are resolved. Will have pulmonary evaluate for workup of nodule and also for pre-op risk assessment.   45  minutes critical care time.   Loralie Champagne 09/14/2015 5:00 PM

## 2015-09-14 NOTE — Progress Notes (Signed)
Occupational Therapy Treatment Patient Details Name: Jillian Bright MRN: [id number] DOB: 1945/07/03 Today's Date: 09/14/2015    History of present illness Jillian Bright is a 70 y.o. female has a past medical history of Diabetes mellitus; COPD (chronic obstructive pulmonary disease); Sleep apnea, obstructive; Pulmonary HTN ; Obesity; Hyperlipidemia; Myocardial infarction; Coronary artery disease; Anxiety; Shortness of breath; GERD (gastroesophageal reflux disease); Arthritis; CHF (congestive heart failure); Pneumonia; Bronchitis; and History of hiatal hernia. Admitted for Acute on chronic diastolic CHF   OT comments  Patient making slow, but steady progress towards OT goals - continue plan of care for now. Pt on 4L/min supplemental 02 and sats remaining at 82% at rest. Sats decreased to as low as 77% after activity on 4L/min supplemental 02. Continue to recommend SNF as pt lives alone with no assistance post acute d/c.    Follow Up Recommendations  SNF;Supervision/Assistance - 24 hour    Equipment Recommendations  Other (comment) (TBD next venue of care)    Recommendations for Other Services  None at this time   Precautions / Restrictions Precautions Precautions: Fall Precaution Comments: monitor O2 an BP Restrictions Weight Bearing Restrictions: No     Mobility Bed Mobility General bed mobility comments: seated on BSC upon entering and seated in recliner upon exiting room   Transfers Overall transfer level: Needs assistance Equipment used: Rolling walker (2 wheeled) Transfers: Sit to/from Stand Sit to Stand: Supervision General transfer comment: Close supervision for safety     Balance Overall balance assessment: Needs assistance Sitting-balance support: No upper extremity supported;Feet supported Sitting balance-Leahy Scale: Good     Standing balance support: Bilateral upper extremity supported;During functional activity Standing balance-Leahy Scale: Fair Standing  balance comment: Using RW for support   ADL Overall ADL's : Needs assistance/impaired   Toilet Transfer: Min guard;Ambulation;BSC   Toileting- Water quality scientist and Hygiene: Min guard;Sit to/from stand  General ADL Comments: Pt refused use of gait belt. Pt stated she already completed ADL at sink this morning in sit to/from stand position from chair. Pt required close supervision for safety with mobility, pt using RW for functional mobility. Pt with decreased safety awareness, pt with very loose socks and starting to get tripped up in them.      Cognition   Behavior During Therapy: Flat affect;Impulsive;Anxious;Agitated (varied throughout session) Overall Cognitive Status: Impaired/Different from baseline Area of Impairment: Memory;Safety/judgement     Memory: Decreased short-term memory    Safety/Judgement: Decreased awareness of deficits;Decreased awareness of safety                 Pertinent Vitals/ Pain       Pain Assessment: No/denies pain   Frequency Min 2X/week     Progress Toward Goals  OT Goals(current goals can now befound in the care plan section)  Progress towards OT goals: Progressing toward goals  Acute Rehab OT Goals Patient Stated Goal: none stated OT Goal Formulation: With patient Time For Goal Achievement: 09/23/15 Potential to Achieve Goals: Good  Plan Discharge plan remains appropriate    End of Session Equipment Utilized During Treatment: Oxygen (4L/min)   Activity Tolerance Patient tolerated treatment well   Patient Left in chair;with call bell/phone within reach;with nursing/sitter in room    Time: 1141-1205 OT Time Calculation (min): 24 min  Charges: OT General Charges $OT Visit: 1 Procedure OT Treatments $Self Care/Home Management : 23-37 mins  Chrys Racer , MS, OTR/L, CLT Pager: [phone]  09/14/2015, 12:22 PM

## 2015-09-14 NOTE — Telephone Encounter (Signed)
fyi pt is in [place].

## 2015-09-14 NOTE — Progress Notes (Signed)
PT Cancellation Note  Patient Details Name: Jillian Bright MRN: [id number] DOB: 1945-12-17   Cancelled Treatment:    Reason Eval/Treat Not Completed: Other (comment). Pt sitting up in recliner with visitor present, and pt stating "I am not going to walk." She declined therapy at this time due to just receiving IV Lasix and not wanting to have to deal with getting up to use the bathroom constantly. This therapist suggested seated or standing therex as an alternative, but pt continued to decline. When this therapist suggested she come back later, pt stated she would rather do therapy today. Will continue to follow and continue PT POC. RN is aware and was present in room.   Lars Masson, PT, DPT Pager #: [phone]  09/14/2015, 1:49 PM

## 2015-09-14 NOTE — Progress Notes (Signed)
PROGRESS NOTE    Jillian Bright  WPY:[id number] DOB: 23-May-1946 DOA: 09/07/2015 PCP: [name], MD  Outpatient Specialists:     Brief Narrative: 70 year old female with history of diabetes mellitus, COPD, sleep apnea, pulmonary hypertension, obesity, history of coronary artery disease, congestive heart failure, who presents to the ER with worsening bilateral lower extremity swelling and shortness of breath with orthopnea.Patient is known history of COPD Oxygen ,3 liters at baseline as well as history of diastolic heart failure last echogram was in 2015 showing EF of 05%-39 diastolic,chronic 1 diastolic heart failure and moderate aortic stenosis. Patient found to have left upper lobe pulmonary nodule which has enlarged since prior CT from 2015.   Assessment & Plan:   Principal Problem:   Acute on chronic diastolic (congestive) heart failure (HCC) Active Problems:   Essential hypertension   Pulmonary HTN (HCC)   Chronic diastolic heart failure (HCC)   COPD with emphysema (HCC)   Obesity hypoventilation syndrome (HCC)   CAD (coronary artery disease)   Aortic stenosis   DM type 2 (diabetes mellitus, type 2) (HCC)   On home oxygen therapy   Microcytic anemia   CHF (congestive heart failure) (HCC)   CHF exacerbation (HCC)   Abnormal chest x-ray   Acute on chronic congestive heart failure (HCC)   Morbid obesity (HCC)   Pleural effusion on right   Tricuspid regurgitation   Right-sided congestive heart failure (HCC)   Mitral regurgitation   . Acute on chronic diastolic CHF (congestive heart failure), NYHA class 1 (HCC) - also likely secondary to noncompliance - patient admitted to not taking lasix prior to admission because she did not feel she needed it Continue telemetry Serial trop remained had stable at 0.04. Suspect troponin leak in the setting of acute CHF Repeat 2-D echo with EF 60-65% with mod-severe AS  Cont to hold off on ACE/ARBi due to renal insufficiency  Wt  today 91.17kg<-90.8kg yesterday   Net neg 8.4L since admit Initially responded well to IV lasix, however developed worsening AKI and hypotension Cardiology consulted for assistance.  Patient started on dopamine by cardiology  Lung nodule CT angiogram showed left upper lobe pulmonary nodule which has enlarged since the prior CT from 2015. PET/CT recommended for further evaluation I called and discussed with Dr. Lamonte Sakai, see the patient in consultation for preop evaluation as well as evaluation of the lung nodule.  Severe AS  Valve area <1cm  Consulted CT Surgery Will need LHC and RHC if she goes to surgery  . CAD (coronary artery disease) chronic continue home medications  . Essential hypertension chronic continue home medications   . Microcytic anemia Plan to cont to transfuse for hemoglobin below 7 or if significantly symptomatic. Given IV iron. Pt is iron deficient . Patient will need ferrous sulfate on discharge and possible GI evaluation as outpatient - Hgb down to 6.9 on 4/16, appropriately responded to 2 units of PRBC's -Hgb remains stable  . COPD with emphysema ([place]) stable continue home oxygen and inhalers  Diabetes mellitus hold by mouth medications order sliding scale, cont low-dose Lantus  Leg edema suspect likely secondary to heart failure. LE dopplers neg for DVT  Constipation: Reports no BM for one week. Pt now having BM with cathartics.  DVT prophylaxis: SCD's Code Status:DNR Family Communication: No family present at bedside Disposition Plan: Pending workup for lung nodule and treatment for severe aortic stenosis   Consultants:   CT surgery  Cardiology  Procedures:     Antimicrobials:  Subjective: Patient denies chest pain or shortness of breath.  Objective: Filed Vitals:   09/14/15 0233 09/14/15 0748 09/14/15 1228 09/14/15 1230  BP:   115/68   Pulse:   106   Temp:   98.4 F (36.9 C)   TempSrc:   Oral   Resp:   18   Height:       Weight: 92.216 kg (203 lb 4.8 oz)     SpO2:  95% 88% 94%    Intake/Output Summary (Last 24 hours) at 09/14/15 1420 Last data filed at 09/14/15 0927  Gross per 24 hour  Intake    600 ml  Output    750 ml  Net   -150 ml   Filed Weights   09/12/15 0558 09/13/15 0413 09/14/15 0233  Weight: 90.855 kg (200 lb 4.8 oz) 91.173 kg (201 lb) 92.216 kg (203 lb 4.8 oz)    Examination:  General exam: Appears calm and comfortable, sitting in chair  Respiratory system: Clear to auscultation. Respiratory effort normal. Cardiovascular system: S1 & S2 heard, RRR. No JVD, murmurs, rubs, gallops or clicks.  Gastrointestinal system: Abdomen is nontender. No organomegaly or masses felt. Posl bowel sounds Central nervous system: Alert and oriented. No focal neurological deficits. Extremities: Symmetric 5 x 5 power. B LE pitting edema Skin: No rashes, lesions or ulcers Psychiatry: Judgement and insight appear normal. Mood & affect appropriate.     Data Reviewed: I have personally reviewed following labs and imaging studies  CBC:  Recent Labs Lab 09/07/15 2000 09/09/15 0359 09/11/15 0325 09/12/15 0302 09/13/15 0442  WBC 6.7 5.3 6.1 6.2 5.9  NEUTROABS 4.1  --   --   --   --   HGB 8.0* 7.2* 6.8* 8.9* 8.8*  HCT 30.4* 29.3* 27.5* 33.5* 34.0*  MCV 72.4* 73.3* 72.8* 76.7* 77.8*  PLT 194 162 148* 134* 299*   Basic Metabolic Panel:  Recent Labs Lab 09/10/15 0250 09/11/15 0325 09/12/15 0302 09/13/15 0442 09/14/15 0402  NA 137 136 134* 137 135  K 3.7 4.0 4.1 4.5 4.4  CL 87* 84* 81* 84* 86*  CO2 38* 42* 41* 41* 41*  GLUCOSE 250* 212* 220* 202* 165*  BUN 26* 24* 27* 29* 26*  CREATININE 1.10* 1.28* 1.25* 1.12* 1.08*  CALCIUM 7.4* 6.9* 7.0* 7.6* 8.3*   GFR: Estimated Creatinine Clearance: 54.7 mL/min (by C-G formula based on Cr of 1.08). Liver Function Tests:  Recent Labs Lab 09/09/15 0359 09/14/15 0402  AST 28 23  ALT 24 17  ALKPHOS 54 53  BILITOT 1.1 1.2  PROT 6.6 6.7   ALBUMIN 3.5 3.3*   Cardiac Enzymes:  Recent Labs Lab 09/07/15 2000 09/08/15 0029 09/08/15 0713 09/08/15 1250  TROPONINI 0.04* 0.04* 0.04* 0.04*   BNP (last 3 results) No results for input(s): PROBNP in the last 8760 hours. HbA1C: No results for input(s): HGBA1C in the last 72 hours. CBG:  Recent Labs Lab 09/13/15 1555 09/13/15 2210 09/14/15 0750 09/14/15 0809 09/14/15 1206  GLUCAP 264* 117* 177* 173* 206*   Urine analysis:    Component Value Date/Time   COLORURINE YELLOW 09/13/2015 2230   APPEARANCEUR CLEAR 09/13/2015 2230   LABSPEC 1.031* 09/13/2015 2230   PHURINE 6.0 09/13/2015 [place] 09/13/2015 2230   HGBUR NEGATIVE 09/13/2015 2230   HGBUR negative 08/04/2008 [place] 09/13/2015 2230   BILIRUBINUR n 11/24/2012 [place] 09/13/2015 [place] 09/13/2015 2230   PROTEINUR trace 11/24/2012 1335  UROBILINOGEN 0.2 11/18/2014 0750   UROBILINOGEN 0.2 11/24/2012 1335   NITRITE NEGATIVE 09/13/2015 2230   NITRITE n 11/24/2012 1335   LEUKOCYTESUR NEGATIVE 09/13/2015 2230    Radiology Studies: Ct Angio Chest Pe W/cm &/or Wo Cm  09/13/2015  CLINICAL DATA:  Shortness of breath for 2 months. Subsequent encounter. EXAM: CT ANGIOGRAPHY CHEST WITH CONTRAST TECHNIQUE: Multidetector CT imaging of the chest was performed using the standard protocol during bolus administration of intravenous contrast. Multiplanar CT image reconstructions and MIPs were obtained to evaluate the vascular anatomy. CONTRAST:  100 cc Isovue 370. COMPARISON:  Single view of the chest 11/17/2014. PA and lateral chest 09/08/2015. FINDINGS: No pulmonary embolus is identified. The patient has a moderate right pleural effusion. There is no left pleural effusion or pericardial effusion. Marked cardiomegaly is noted. Calcific aortic and coronary atherosclerosis is seen. No axillary, hilar or mediastinal lymphadenopathy by CT size criteria is  seen. 0.9 cm prevascular node on image 36 is noted. Lungs demonstrate emphysematous disease. A 1.1 cm AP x 0.9 cm transverse x 1.5 cm craniocaudal nodule on image 20 of series 6 and coronal image 83 measured 1.0 x 0.6 x 0.8 cm on the prior CT. The nodule is lobulated. The patient's right pleural effusion results in compressive atelectatic change. The lungs are otherwise unremarkable. No focal abnormality is seen in the imaged upper abdomen. No worrisome bony lesion is seen. Hemangiomas and upper thoracic vertebral bodies are unchanged. Review of the MIP images confirms the above findings. IMPRESSION: Negative for pulmonary embolus. Left upper lobe pulmonary nodule has enlarged since the prior CT scan and could be a bronchogenic carcinoma. PET CT scan is recommended for further evaluation. Moderate right pleural effusion with associated compressive atelectasis. Marked cardiomegaly. Calcific aortic and coronary atherosclerosis. Electronically Signed   By: Inge Rise M.D.   On: 09/13/2015 15:58        Scheduled Meds: . sodium chloride   Intravenous Once  . aspirin  81 mg Oral Daily  . atorvastatin  20 mg Oral Daily  . darifenacin  7.5 mg Oral Daily  . DULoxetine  60 mg Oral Daily  . furosemide  80 mg Intravenous BID  . gabapentin  300 mg Oral TID  . insulin aspart  0-5 Units Subcutaneous QHS  . insulin aspart  0-9 Units Subcutaneous TID WC  . insulin aspart  3 Units Subcutaneous TID WC  . insulin glargine  5 Units Subcutaneous QHS  . mometasone-formoterol  2 puff Inhalation BID  . pantoprazole  40 mg Oral Daily  . polyethylene glycol  17 g Oral Daily  . sodium chloride flush  3 mL Intravenous Q12H  . spironolactone  12.5 mg Oral Daily  . traZODone  50 mg Oral QHS   Continuous Infusions: . DOPamine Stopped (09/14/15 1230)     LOS: 7 days    Oswald Hillock, MD [facility] Pager [phone]  If 7PM-7AM, please contact night-coverage [email or web address] Password [email or web address] 09/14/2015,  2:20 PM

## 2015-09-15 DIAGNOSIS — J9601 Acute respiratory failure with hypoxia: Secondary | ICD-10-CM | POA: Insufficient documentation

## 2015-09-15 DIAGNOSIS — I5032 Chronic diastolic (congestive) heart failure: Secondary | ICD-10-CM

## 2015-09-15 LAB — TYPE AND SCREEN
ABO/RH(D): O NEG
Antibody Screen: NEGATIVE
Unit division: 0
Unit division: 0
Unit division: 0

## 2015-09-15 LAB — BASIC METABOLIC PANEL
ANION GAP: 10 (ref 5–15)
BUN: 27 mg/dL — AB (ref 6–20)
CHLORIDE: 85 mmol/L — AB (ref 101–111)
CO2: 42 mmol/L — ABNORMAL HIGH (ref 22–32)
Calcium: 8.5 mg/dL — ABNORMAL LOW (ref 8.9–10.3)
Creatinine, Ser: 1.04 mg/dL — ABNORMAL HIGH (ref 0.44–1.00)
GFR calc Af Amer: >60 mL/min (ref >60)
GFR, EST NON AFRICAN AMERICAN: 53 mL/min — AB (ref >60)
GLUCOSE: 188 mg/dL — AB (ref 65–99)
Potassium: 3.9 mmol/L (ref 3.5–5.1)
SODIUM: 137 mmol/L (ref 135–145)

## 2015-09-15 LAB — GLUCOSE, CAPILLARY
GLUCOSE-CAPILLARY: 212 mg/dL — AB (ref 65–99)
GLUCOSE-CAPILLARY: 267 mg/dL — AB (ref 65–99)
Glucose-Capillary: 151 mg/dL — ABNORMAL HIGH (ref 65–99)
Glucose-Capillary: 300 mg/dL — ABNORMAL HIGH (ref 65–99)

## 2015-09-15 LAB — MRSA PCR SCREENING: MRSA BY PCR: NEGATIVE

## 2015-09-15 LAB — CARBOXYHEMOGLOBIN
CARBOXYHEMOGLOBIN: 2.5 % — AB (ref 0.5–1.5)
METHEMOGLOBIN: 0.6 % (ref 0.0–1.5)
O2 SAT: 66 %
Total hemoglobin: 8.7 g/dL — ABNORMAL LOW (ref 12.0–16.0)

## 2015-09-15 MED ORDER — OXYCODONE-ACETAMINOPHEN 5-325 MG PO TABS
2.0000 | ORAL_TABLET | Freq: Four times a day (QID) | ORAL | Status: DC | PRN
Start: 2015-09-15 — End: 2015-09-26
  Administered 2015-09-15: 2 via ORAL
  Administered 2015-09-15: 1 via ORAL
  Administered 2015-09-16 – 2015-09-21 (×11): 2 via ORAL
  Administered 2015-09-25: 1 via ORAL
  Filled 2015-09-15 (×16): qty 2

## 2015-09-15 NOTE — Progress Notes (Signed)
Name: Jillian Bright MRN: [id number] DOB: Mar 01, 1946    ADMISSION DATE:  09/07/2015 CONSULTATION DATE:  4/19  REFERRING MD :  [facility]   CHIEF COMPLAINT:  Lung nodule   BRIEF PATIENT DESCRIPTION: 70yo morbidly obese female former smoker (quit 2009) with multiple medical problems including COPD, OSA (intolerant to CPAP), Pulmonary HTN (PA peak pressure 61 mmHg), dCHF, Severe AS, CAD, chronic respiratory failure on home O2 initially presented 4/12 with 3 week hx BLE edema, SOB, orthopnea.  She was admitted, diuresed and seen in consultation by CVTS for possible aortic valve repair/replacement.  CTA chest to r/o PE revealed enlarging LUL pulmonary nodule and PCCM consulted.   SIGNIFICANT EVENTS  4/12 admitted for SOB 4/19 PCCM consulted for enlarging LUL nodule.   STUDIES:  CTA chest 4/19>>>Negative for pulmonary embolus.  Left upper lobe pulmonary nodule has enlarged since the prior CT scan and could be a bronchogenic carcinoma. PET CT scan is recommended for further evaluation.  Moderate right pleural effusion with associated compressive atelectasis. 2D echo 4/13>>> 42-70%, grade 1 diastolic dysfunction, severe AS, mild MR, severely dilated RA, PA peak pressure 53mHg  HISTORY OF PRESENT ILLNESS:  69yo morbidly obese female former smoker (quit 2009) with multiple medical problems including COPD, OSA (intolerant to CPAP), Pulmonary HTN (PA peak pressure 61 mmHg), dCHF, Severe AS, CAD, chronic respiratory failure on home O2 initially presented 4/12 with 3 week hx BLE edema, SOB, orthopnea.  She was admitted, diuresed and seen in consultation by CVTS for possible aortic valve repair/replacement.  CTA chest to r/o PE revealed enlarging LUL pulmonary nodule and PCCM consulted.    SUBJECTIVE:  O2 dasatn walking to bedside commode >> dropped into 70s >> long recovery.  Transferred to SDU 2/2 above. Remains SOB. Easily desaturates to 80s on minimal exertion.  direscing well   VITAL  SIGNS: Temp:  [97.6 F (36.4 C)-98.4 F (36.9 C)] 97.7 F (36.5 C) (04/20 0900) Pulse Rate:  [96-106] 106 (04/20 0500) Resp:  [10-24] 24 (04/20 0500) BP: (90-115)/(56-79) 103/79 mmHg (04/20 0500) SpO2:  [86 %-94 %] 90 % (04/20 0852) Weight:  [207 lb 7.3 oz (94.1 kg)] 207 lb 7.3 oz (94.1 kg) (04/20 0500)  PHYSICAL EXAMINATION: General:  Chronically ill appearing female, NAD  Neuro:  Awake, alert, appropriate, MAE  HEENT:  Mm moist, no JVD  Cardiovascular:  s1s2 rrr Lungs:  resps even non labored at rest on 4L [place], bibasilar crackles Abdomen:  Round, soft, +bs  Musculoskeletal:  Warm and dry, 3+ BLE edema, erythema     Recent Labs Lab 09/13/15 0442 09/14/15 0402 09/15/15 0557  NA 137 135 137  K 4.5 4.4 3.9  CL 84* 86* 85*  CO2 41* 41* 42*  BUN 29* 26* 27*  CREATININE 1.12* 1.08* 1.04*  GLUCOSE 202* 165* 188*    Recent Labs Lab 09/11/15 0325 09/12/15 0302 09/13/15 0442  HGB 6.8* 8.9* 8.8*  HCT 27.5* 33.5* 34.0*  WBC 6.1 6.2 5.9  PLT 148* 134* 134*   Ct Angio Chest Pe W/cm &/or Wo Cm  09/13/2015  CLINICAL DATA:  Shortness of breath for 2 months. Subsequent encounter. EXAM: CT ANGIOGRAPHY CHEST WITH CONTRAST TECHNIQUE: Multidetector CT imaging of the chest was performed using the standard protocol during bolus administration of intravenous contrast. Multiplanar CT image reconstructions and MIPs were obtained to evaluate the vascular anatomy. CONTRAST:  100 cc Isovue 370. COMPARISON:  Single view of the chest 11/17/2014. PA and lateral chest 09/08/2015. FINDINGS: No pulmonary embolus is  identified. The patient has a moderate right pleural effusion. There is no left pleural effusion or pericardial effusion. Marked cardiomegaly is noted. Calcific aortic and coronary atherosclerosis is seen. No axillary, hilar or mediastinal lymphadenopathy by CT size criteria is seen. 0.9 cm prevascular node on image 36 is noted. Lungs demonstrate emphysematous disease. A 1.1 cm AP x 0.9 cm  transverse x 1.5 cm craniocaudal nodule on image 20 of series 6 and coronal image 83 measured 1.0 x 0.6 x 0.8 cm on the prior CT. The nodule is lobulated. The patient's right pleural effusion results in compressive atelectatic change. The lungs are otherwise unremarkable. No focal abnormality is seen in the imaged upper abdomen. No worrisome bony lesion is seen. Hemangiomas and upper thoracic vertebral bodies are unchanged. Review of the MIP images confirms the above findings. IMPRESSION: Negative for pulmonary embolus. Left upper lobe pulmonary nodule has enlarged since the prior CT scan and could be a bronchogenic carcinoma. PET CT scan is recommended for further evaluation. Moderate right pleural effusion with associated compressive atelectasis. Marked cardiomegaly. Calcific aortic and coronary atherosclerosis. Electronically Signed   By: Inge Rise M.D.   On: 09/13/2015 15:58    ASSESSMENT / PLAN:  LUL pulmonary nodule (11.65m x 8.762m - slightly increased from previous CT scan in 2015 COPD  OSA - intolerant to CPAP - followed by Dr. SoHalford ChessmanSecondary PAPorter effusion with associated RLL collapse Pt with mod-severe AS, RV failure, acute on chronic diastolic HF exacerbation, CAD Acute on Chronic Hypoxemic Hypercapneic Resp Fx 2/2 above  REC -  Needs more O2 supplementation, easily desaturates on 4LNC; we switched probes and she was 99% on 4L >> will observe for now, if she continues to desaturate, will try HFNC > lowest o2 possible as pt retains CO2.   Continue diuresis per cards. Agree with aggressive diuresis Consider a R thoracentesis for therapeutic benefit given her acute on chronic resp failure Continue dulera, PRN SABA  Pulmonary hygiene  She would benefit from CPAP but is unable to tolerate  She is certainly at increased risk for peri-operative pulmonary complications including inability to wean from the ventilator post op, but would favor proceeding with the remainder of the w/u  for her aortic valve repair (including cath, TEE, etc).  Once pt is improved and can be d/c would do outpt PET scan to assist with risk stratification of LUL nodule and possible contribution to staging if this is a primary lung cancer.     J.Monica BectonMD 09/15/2015, 12:06 PM [facility] Pager [phone] After 3 pm or if no answer, call [phone]

## 2015-09-15 NOTE — Progress Notes (Signed)
PCCM INTERVAL PROGRESS NOTE  Called by Advanced Heart Failure team to evaluate Mrs. Jillian Bright for potential thoracentesis. Risks and benefits were discussed in detail. She was informed that the procedure would likely improve her breathing, but benefits may only be temporary as effusion is likely to re-accumulate if caused by CHF.  She would like to think about this before making a decision. She is leaning towards not having the procedure if relief could potentially only be minimal and temporary. She is aware that this can be re-visited at any time.   Georgann Housekeeper, AGACNP-BC [facility] Pager [phone] or [phone]  09/15/2015 2:42 PM

## 2015-09-15 NOTE — Progress Notes (Signed)
PROGRESS NOTE    MARILENE VATH  ZJQ:[id number] DOB: [birth year] DOA: 09/07/2015 PCP: [name], MD  Outpatient Specialists:     Brief Narrative: 70 year old female with history of diabetes mellitus, COPD, sleep apnea, pulmonary hypertension, obesity, history of coronary artery disease, congestive heart failure, who presents to the ER with worsening bilateral lower extremity swelling and shortness of breath with orthopnea.Patient is known history of COPD Oxygen ,3 liters at baseline as well as history of diastolic heart failure last echogram was in 2015 showing EF of 24%-09 diastolic,chronic 1 diastolic heart failure and moderate aortic stenosis. Patient found to have left upper lobe pulmonary nodule which has enlarged since prior CT from 2015.patient was started on dopamine by cardiology, which has now been discontinued.   Assessment & Plan:   Principal Problem:   Acute on chronic diastolic (congestive) heart failure (HCC) Active Problems:   Essential hypertension   Pulmonary HTN (HCC)   Chronic diastolic heart failure (HCC)   COPD with emphysema (HCC)   Obesity hypoventilation syndrome (HCC)   CAD (coronary artery disease)   Aortic stenosis   DM type 2 (diabetes mellitus, type 2) (HCC)   On home oxygen therapy   Microcytic anemia   CHF (congestive heart failure) (HCC)   CHF exacerbation (HCC)   Abnormal chest x-ray   Acute on chronic congestive heart failure (HCC)   Morbid obesity (HCC)   Pleural effusion on right   Tricuspid regurgitation   Right-sided congestive heart failure (HCC)   Mitral regurgitation   . Acute on chronic diastolic CHF (congestive heart failure), NYHA class 1 (HCC) - also likely secondary to noncompliance - patient admitted to not taking lasix prior to admission because she did not feel she needed it Continue telemetry Serial trop remained had stable at 0.04. Suspect troponin leak in the setting of acute CHF Repeat 2-D echo with EF 60-65% with  mod-severe AS  Cont to hold off on ACE/ARBi due to renal insufficiency  Wt today 91.17kg<-90.8kg yesterday   Net neg 8.4L since admit Initially responded well to IV lasix, however developed worsening AKI and hypotension Cardiology consulted for assistance.   Lung nodule CT angiogram showed left upper lobe pulmonary nodule which has enlarged since the prior CT from 2015. PET/CT recommended for further evaluation Pulmonary was consulted for further evaluation.At this time pulmonology recommends outpatient PET/CT.  Severe AS  Valve area <1cm  Consulted CT Surgery Will need LHC and RHC if she goes to surgery  . CAD (coronary artery disease) chronic continue home medications  . Essential hypertension chronic continue home medications   . Microcytic anemia Plan to cont to transfuse for hemoglobin below 7 or if significantly symptomatic. Given IV iron. Pt is iron deficient . Patient will need ferrous sulfate on discharge and possible GI evaluation as outpatient - Hgb down to 6.9 on 4/16, appropriately responded to 2 units of PRBC's -Hgb remains stable  . COPD with emphysema ([place]) stable continue home oxygen and inhalers  Diabetes mellitus hold by mouth medications order sliding scale, cont low-dose Lantus  Leg edema suspect likely secondary to heart failure. LE dopplers neg for DVT  Constipation: Reports no BM for one week. Pt now having BM with cathartics.  DVT prophylaxis: SCD's Code Status:DNR Family Communication: No family present at bedside Disposition Plan: Pending workup for lung nodule and treatment for severe aortic stenosis   Consultants:   CT surgery  Cardiology  Procedures:     Antimicrobials:    Subjective: Patient seen  and examined. Just woke up from sleep. Denies chest pain or shortness of breath  Objective: Filed Vitals:   09/15/15 0100 09/15/15 0500 09/15/15 0852 09/15/15 0900  BP:  103/79    Pulse: 96 106    Temp:  98 F (36.7 C)  97.7 F  (36.5 C)  TempSrc:  Oral  Oral  Resp: 10 24    Height:      Weight:  94.1 kg (207 lb 7.3 oz)    SpO2: 87% 90% 90%     Intake/Output Summary (Last 24 hours) at 09/15/15 1122 Last data filed at 09/14/15 2200  Gross per 24 hour  Intake    220 ml  Output   1852 ml  Net  -1632 ml   Filed Weights   09/13/15 0413 09/14/15 0233 09/15/15 0500  Weight: 91.173 kg (201 lb) 92.216 kg (203 lb 4.8 oz) 94.1 kg (207 lb 7.3 oz)    Examination:  General exam: Appears calm and comfortable, sitting in chair  Respiratory system: Clear to auscultation. Respiratory effort normal. Cardiovascular system: S1 & S2 heard, RRR. No JVD, murmurs, rubs, gallops or clicks.  Gastrointestinal system: Abdomen is nontender. No organomegaly or masses felt. Posl bowel sounds Central nervous system: Alert and oriented. No focal neurological deficits. Extremities: Symmetric 5 x 5 power. B LE pitting edema Skin: No rashes, lesions or ulcers Psychiatry: Judgement and insight appear normal. Mood & affect appropriate.     Data Reviewed: I have personally reviewed following labs and imaging studies  CBC:  Recent Labs Lab 09/09/15 0359 09/11/15 0325 09/12/15 0302 09/13/15 0442  WBC 5.3 6.1 6.2 5.9  HGB 7.2* 6.8* 8.9* 8.8*  HCT 29.3* 27.5* 33.5* 34.0*  MCV 73.3* 72.8* 76.7* 77.8*  PLT 162 148* 134* 944*   Basic Metabolic Panel:  Recent Labs Lab 09/11/15 0325 09/12/15 0302 09/13/15 0442 09/14/15 0402 09/15/15 0557  NA 136 134* 137 135 137  K 4.0 4.1 4.5 4.4 3.9  CL 84* 81* 84* 86* 85*  CO2 42* 41* 41* 41* 42*  GLUCOSE 212* 220* 202* 165* 188*  BUN 24* 27* 29* 26* 27*  CREATININE 1.28* 1.25* 1.12* 1.08* 1.04*  CALCIUM 6.9* 7.0* 7.6* 8.3* 8.5*   GFR: Estimated Creatinine Clearance: 57.3 mL/min (by C-G formula based on Cr of 1.04). Liver Function Tests:  Recent Labs Lab 09/09/15 0359 09/14/15 0402  AST 28 23  ALT 24 17  ALKPHOS 54 53  BILITOT 1.1 1.2  PROT 6.6 6.7  ALBUMIN 3.5 3.3*    Cardiac Enzymes:  Recent Labs Lab 09/08/15 1250  TROPONINI 0.04*   BNP (last 3 results) No results for input(s): PROBNP in the last 8760 hours. HbA1C: No results for input(s): HGBA1C in the last 72 hours. CBG:  Recent Labs Lab 09/14/15 0809 09/14/15 1206 09/14/15 1713 09/14/15 2111 09/15/15 0841  GLUCAP 173* 206* 166* 176* 151*   Urine analysis:    Component Value Date/Time   COLORURINE YELLOW 09/13/2015 2230   APPEARANCEUR CLEAR 09/13/2015 2230   LABSPEC 1.031* 09/13/2015 2230   PHURINE 6.0 09/13/2015 2230   GLUCOSEU NEGATIVE 09/13/2015 2230   HGBUR NEGATIVE 09/13/2015 2230   HGBUR negative 08/04/2008 1526   BILIRUBINUR NEGATIVE 09/13/2015 2230   BILIRUBINUR n 11/24/2012 1335   KETONESUR NEGATIVE 09/13/2015 2230   PROTEINUR NEGATIVE 09/13/2015 2230   PROTEINUR trace 11/24/2012 1335   UROBILINOGEN 0.2 11/18/2014 0750   UROBILINOGEN 0.2 11/24/2012 1335   NITRITE NEGATIVE 09/13/2015 2230   NITRITE n 11/24/2012 1335  LEUKOCYTESUR NEGATIVE 09/13/2015 2230    Radiology Studies: Ct Angio Chest Pe W/cm &/or Wo Cm  09/13/2015  CLINICAL DATA:  Shortness of breath for 2 months. Subsequent encounter. EXAM: CT ANGIOGRAPHY CHEST WITH CONTRAST TECHNIQUE: Multidetector CT imaging of the chest was performed using the standard protocol during bolus administration of intravenous contrast. Multiplanar CT image reconstructions and MIPs were obtained to evaluate the vascular anatomy. CONTRAST:  100 cc Isovue 370. COMPARISON:  Single view of the chest 11/17/2014. PA and lateral chest 09/08/2015. FINDINGS: No pulmonary embolus is identified. The patient has a moderate right pleural effusion. There is no left pleural effusion or pericardial effusion. Marked cardiomegaly is noted. Calcific aortic and coronary atherosclerosis is seen. No axillary, hilar or mediastinal lymphadenopathy by CT size criteria is seen. 0.9 cm prevascular node on image 36 is noted. Lungs demonstrate emphysematous  disease. A 1.1 cm AP x 0.9 cm transverse x 1.5 cm craniocaudal nodule on image 20 of series 6 and coronal image 83 measured 1.0 x 0.6 x 0.8 cm on the prior CT. The nodule is lobulated. The patient's right pleural effusion results in compressive atelectatic change. The lungs are otherwise unremarkable. No focal abnormality is seen in the imaged upper abdomen. No worrisome bony lesion is seen. Hemangiomas and upper thoracic vertebral bodies are unchanged. Review of the MIP images confirms the above findings. IMPRESSION: Negative for pulmonary embolus. Left upper lobe pulmonary nodule has enlarged since the prior CT scan and could be a bronchogenic carcinoma. PET CT scan is recommended for further evaluation. Moderate right pleural effusion with associated compressive atelectasis. Marked cardiomegaly. Calcific aortic and coronary atherosclerosis. Electronically Signed   By: Inge Rise M.D.   On: 09/13/2015 15:58        Scheduled Meds: . sodium chloride   Intravenous Once  . aspirin  81 mg Oral Daily  . atorvastatin  20 mg Oral Daily  . darifenacin  7.5 mg Oral Daily  . DULoxetine  60 mg Oral Daily  . furosemide  80 mg Intravenous BID  . gabapentin  300 mg Oral TID  . insulin aspart  0-5 Units Subcutaneous QHS  . insulin aspart  0-9 Units Subcutaneous TID WC  . insulin aspart  3 Units Subcutaneous TID WC  . insulin glargine  5 Units Subcutaneous QHS  . mometasone-formoterol  2 puff Inhalation BID  . pantoprazole  40 mg Oral Daily  . polyethylene glycol  17 g Oral Daily  . sodium chloride flush  3 mL Intravenous Q12H  . spironolactone  12.5 mg Oral Daily  . traZODone  50 mg Oral QHS   Continuous Infusions:     LOS: 8 days    Oswald Hillock, MD [facility] Pager [phone]  If 7PM-7AM, please contact night-coverage [email or web address] Password [email or web address] 09/15/2015, 11:22 AM

## 2015-09-15 NOTE — Progress Notes (Signed)
Advanced Heart Failure Rounding Note   Subjective:   70 year old female history of coronary artery disease, congestive heart failure, diabetes mellitus, oxygen dependent COPD, obstructive sleep apnea-intolerant to CPAP, pulmonary hypertension, obesity, hyperlipidemia. She was last seen by Dr. Aundra Dubin in August 2013. She initially presented with peripheral edema in 2009 and had a normal stress test and preserved EF on echocardiogram. She had a right heart catheterization which showed mild/moderate pulmonary hypertension which responded well to oxygen with decreased PA pressure. She had 2-D echocardiogram on 09/08/2015 revealing normal ejection fraction of 60-65% and wall motion. Grade 1 diastolic dysfunction. Moderate to severe aortic stenosis where previously was moderate. Mild MR. Left atrium mildly dilated. Peak PA pressure 61 mmHg. Right atrium moderately to severely dilated. Moderate tricuspid valve regurgitation.  Yesterday, diuresed with IV lasix. Weight trending up?    Complaining of fatigue.   09/13/15 CTA: negative for PE, enlarging lung nodule noted.  Objective:   Weight Range:  Vital Signs:   Temp:  [97.6 F (36.4 C)-98.4 F (36.9 C)] 98 F (36.7 C) (04/20 0500) Pulse Rate:  [96-106] 106 (04/20 0500) Resp:  [10-24] 24 (04/20 0500) BP: (90-115)/(56-79) 103/79 mmHg (04/20 0500) SpO2:  [86 %-95 %] 90 % (04/20 0500) Weight:  [207 lb 7.3 oz (94.1 kg)] 207 lb 7.3 oz (94.1 kg) (04/20 0500) Last BM Date: 09/14/15  Weight change: Filed Weights   09/13/15 0413 09/14/15 0233 09/15/15 0500  Weight: 201 lb (91.173 kg) 203 lb 4.8 oz (92.216 kg) 207 lb 7.3 oz (94.1 kg)    Intake/Output:   Intake/Output Summary (Last 24 hours) at 09/15/15 0744 Last data filed at 09/14/15 2200  Gross per 24 hour  Intake    580 ml  Output   2602 ml  Net  -2022 ml     Physical Exam: General: NAD. No resp difficulty. In bed.  HEENT: normal Neck: supple. JVP 14-16 cm. Carotids 2+ bilat;  no bruits. No lymphadenopathy or thryomegaly appreciated. Cor: PMI nondisplaced. Regular rate & rhythm. No rubs, gallops.  3/6 crescendo-decrescendo murmur RUSB with muffled S2. Lungs: RML RLL decreased. On 3 liters oxygen Abdomen: soft, nontender, nondistended. No hepatosplenomegaly. No bruits or masses. Good bowel sounds. Extremities: no cyanosis, clubbing, rash, R and LLE 3+ edema Erythema noted RLE LLE Pink  Neuro: alert & orientedx3, cranial nerves grossly intact. moves all 4 extremities w/o difficulty. Affect pleasant  Telemetry: ST 100s  Labs: Basic Metabolic Panel:  Recent Labs Lab 09/11/15 0325 09/12/15 0302 09/13/15 0442 09/14/15 0402 09/15/15 0557  NA 136 134* 137 135 137  K 4.0 4.1 4.5 4.4 3.9  CL 84* 81* 84* 86* 85*  CO2 42* 41* 41* 41* 42*  GLUCOSE 212* 220* 202* 165* 188*  BUN 24* 27* 29* 26* 27*  CREATININE 1.28* 1.25* 1.12* 1.08* 1.04*  CALCIUM 6.9* 7.0* 7.6* 8.3* 8.5*    Liver Function Tests:  Recent Labs Lab 09/09/15 0359 09/14/15 0402  AST 28 23  ALT 24 17  ALKPHOS 54 53  BILITOT 1.1 1.2  PROT 6.6 6.7  ALBUMIN 3.5 3.3*   No results for input(s): LIPASE, AMYLASE in the last 168 hours. No results for input(s): AMMONIA in the last 168 hours.  CBC:  Recent Labs Lab 09/09/15 0359 09/11/15 0325 09/12/15 0302 09/13/15 0442  WBC 5.3 6.1 6.2 5.9  HGB 7.2* 6.8* 8.9* 8.8*  HCT 29.3* 27.5* 33.5* 34.0*  MCV 73.3* 72.8* 76.7* 77.8*  PLT 162 148* 134* 134*    Cardiac Enzymes:  Recent Labs Lab 09/08/15 1250  TROPONINI 0.04*    BNP: BNP (last 3 results)  Recent Labs  11/18/14 1015 09/07/15 2000  BNP 176.5* 848.6*    ProBNP (last 3 results) No results for input(s): PROBNP in the last 8760 hours.    Other results:  Imaging: Ct Angio Chest Pe W/cm &/or Wo Cm  09/13/2015  CLINICAL DATA:  Shortness of breath for 2 months. Subsequent encounter. EXAM: CT ANGIOGRAPHY CHEST WITH CONTRAST TECHNIQUE: Multidetector CT imaging of the chest  was performed using the standard protocol during bolus administration of intravenous contrast. Multiplanar CT image reconstructions and MIPs were obtained to evaluate the vascular anatomy. CONTRAST:  100 cc Isovue 370. COMPARISON:  Single view of the chest 11/17/2014. PA and lateral chest 09/08/2015. FINDINGS: No pulmonary embolus is identified. The patient has a moderate right pleural effusion. There is no left pleural effusion or pericardial effusion. Marked cardiomegaly is noted. Calcific aortic and coronary atherosclerosis is seen. No axillary, hilar or mediastinal lymphadenopathy by CT size criteria is seen. 0.9 cm prevascular node on image 36 is noted. Lungs demonstrate emphysematous disease. A 1.1 cm AP x 0.9 cm transverse x 1.5 cm craniocaudal nodule on image 20 of series 6 and coronal image 83 measured 1.0 x 0.6 x 0.8 cm on the prior CT. The nodule is lobulated. The patient's right pleural effusion results in compressive atelectatic change. The lungs are otherwise unremarkable. No focal abnormality is seen in the imaged upper abdomen. No worrisome bony lesion is seen. Hemangiomas and upper thoracic vertebral bodies are unchanged. Review of the MIP images confirms the above findings. IMPRESSION: Negative for pulmonary embolus. Left upper lobe pulmonary nodule has enlarged since the prior CT scan and could be a bronchogenic carcinoma. PET CT scan is recommended for further evaluation. Moderate right pleural effusion with associated compressive atelectasis. Marked cardiomegaly. Calcific aortic and coronary atherosclerosis. Electronically Signed   By: Inge Rise M.D.   On: 09/13/2015 15:58     Medications:     Scheduled Medications: . sodium chloride   Intravenous Once  . aspirin  81 mg Oral Daily  . atorvastatin  20 mg Oral Daily  . darifenacin  7.5 mg Oral Daily  . DULoxetine  60 mg Oral Daily  . furosemide  80 mg Intravenous BID  . gabapentin  300 mg Oral TID  . insulin aspart  0-5 Units  Subcutaneous QHS  . insulin aspart  0-9 Units Subcutaneous TID WC  . insulin aspart  3 Units Subcutaneous TID WC  . insulin glargine  5 Units Subcutaneous QHS  . mometasone-formoterol  2 puff Inhalation BID  . pantoprazole  40 mg Oral Daily  . polyethylene glycol  17 g Oral Daily  . sodium chloride flush  3 mL Intravenous Q12H  . spironolactone  12.5 mg Oral Daily  . traZODone  50 mg Oral QHS    Infusions:    PRN Medications: sodium chloride, ALPRAZolam, ipratropium-albuterol, ondansetron (ZOFRAN) IV, oxyCODONE-acetaminophen, sodium chloride flush, sodium chloride flush   Assessment/Plan/Discussion    1. Pulmonary hypertension: On ECHO EF 60-65%. Peak PA pressure 61 mm hg, Mod TR RA severely dilated mild MR. Does have OSA but intolerant CPAP. Using 3 liters continuous oxygen. CTA negative for PE. Hold off on PFTs until fully diurese. Would benefit from [place] once fully diuresed.  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 Acute on chronic diastolic heart failure: NYHA IV - Marked volume overload. Stop dopamine. Needs aggressive diuresis.  Add CVP  And CO-OX. Continue diurese with 80 mg IV lasix twice a day. Renal function stable. Continue 12.5 mg spiro daily. Follow BMET closely. 4.  Moderate to severe AS:  CT surgery consulted for consideration TAVR. Marland Kitchen Recommendations for CT angiogram, PFTs, TEE to further assess MR/TR.  5. CAD: Continue aspirin, atorvastatin. 6. R pleural effusion- Mod on CXR. May need thoracentesis if doesn't improve with diuresis.  7. Pulmonary Nodule- Enlarging on CT. Pulmonary following. Will need PET scan to further evaluate.  8. TR- need TEE once fully diuresed.   Length of Stay: [place] NP-C  09/15/2015, 7:44 AM  Advanced Heart Failure Team Pager [phone] (M-F; [place])  Please contact [facility] for night-coverage after hours (4p -7a ) and weekends on [email or web address]  Patient seen with NP, agree with the above note. There are a number of issues here.  1.  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 Echo 4/17 with EF 60-65%, dilated and dysfunctional RV, PASP 61 mmHg, moderate to severe AS with mean gradient 34 and AVA 0.9 cm^2, moderate to severe TR. On exam, she is still markedly volume overloaded. She needs extensive diuresis. By I/Os, she had good diuresis yesterday.   - Follow CVP on PICC - Continue Lasix 80 mg IV bid.  - RHC when more fully diuresed.  - Significant right pleural effusion.  Think we can help her breathing with a therapeutic thoracentesis, will ask pulmonary to proceed.  2. Aortic stenosis: Possibly severe. Clearly progressed from prior echo (several years ago). She will need TEE to assess more closely. Will hold off on this until she is fully diuresed as will also do RHC/LHC. Treatment of the aortic valve will be tricky. Anesthesia will be quite risky given her pulmonary disease (COPD and OHS/OSA). I think that she will do better with TAVR than with open AVR. There is concern that TAVR will not address the TR, however this may improve with diuresis and decreased RVEDP. Per pulmonary, high risk for anesthesia. 3. Pulmonary hypertension: She has a history of pulmonary hypertension thought to be due to OHS/OSA. Suspect her [place] currently is a combination of OHS/OSA and pulmonary venous hypertension from volume overload.  - Will diurese, then RHC.  4. OHS/OSA: Cannot tolerate CPAP at night. She is on home O2 chronically.  5. COPD: On home O2, no longer smokes.  6. CAD: h/o RCA PCI. Will need LHC with RHC prior to any consideration of valve intervention. Will do this after she is more fully diuresed. Continue ASA 81 and statin.  7. Tricuspid regurgitation: Moderate to severe. Will reassess by TEE after more diuresis. This may improve with diuresis.  8. Pulmonary nodule: Enlarging pulmonary nodule concerning for bronchogenic carcinoma, especially with smoking history. This  will have to be addressed after her heart issues are resolved. Will need PET scan after cardiac issues stabilized.   Loralie Champagne 09/15/2015 1:32 PM

## 2015-09-15 NOTE — Progress Notes (Signed)
Physical Therapy Treatment Patient Details Name: Jillian Bright MRN: [id number] DOB: [birth year] Today's Date: 09/15/2015    History of Present Illness Jillian Bright is a 70 y.o. female has a past medical history of Diabetes mellitus; COPD (chronic obstructive pulmonary disease); Sleep apnea, obstructive; Pulmonary HTN ; Obesity; Hyperlipidemia; Myocardial infarction; Coronary artery disease; Anxiety; Shortness of breath; GERD (gastroesophageal reflux disease); Arthritis; CHF (congestive heart failure); Pneumonia; Bronchitis; and History of hiatal hernia. Admitted for Acute on chronic diastolic CHF    PT Comments    Pt admitted with above diagnosis. Pt currently with functional limitations due to the deficits listed below (see PT Problem List). Pt ambulated to window with desaturation on 4LO2 to 81% so increased to 6LO2 however O2 still fluctuating on 6LO2 with activity between 85-90%. MD came in to talk with pt about lung diagnosis therefore had pt walk back to chair.  O2 sats still hovering 81-90% when pt talking on 6LO2.  MD called RT to address sats.  RT came and when they got there sats 96% on 6LO2 therefore they left pt on 6LO2 and will monitor to assess if HFNC may be indicated.  MD wanted to talk with pt and since the sats dropped, terminated session.  Will continue acute PT as pt tolerates. Pt will benefit from skilled PT to increase their independence and safety with mobility to allow discharge to the venue listed below.    Follow Up Recommendations  SNF;Supervision/Assistance - 24 hour     Equipment Recommendations  None recommended by PT    Recommendations for Other Services       Precautions / Restrictions Precautions Precautions: Fall Precaution Comments: monitor O2 an BP Restrictions Weight Bearing Restrictions: No    Mobility  Bed Mobility                  Transfers Overall transfer level: Needs assistance Equipment used: None Transfers: Sit to/from  Stand Sit to Stand: Supervision         General transfer comment: min guard for safety.  VC for hand placement.  Mild sway noted.   Ambulation/Gait Ambulation/Gait assistance: Min guard Ambulation Distance (Feet): 20 Feet Assistive device:  (pushed O2 tank) Gait Pattern/deviations: Step-through pattern;Decreased stride length Gait velocity: decreased Gait velocity interpretation: <1.8 ft/sec, indicative of risk for recurrent falls General Gait Details: Pt able to walk across room to window pushing O2 tank herself.  No LOB with good stability overall just needing steadying when not holding onto O2 tank.     Stairs            Wheelchair Mobility    Modified Rankin (Stroke Patients Only)       Balance Overall balance assessment: Needs assistance;History of Falls Sitting-balance support: No upper extremity supported;Feet supported Sitting balance-Leahy Scale: Good     Standing balance support: Single extremity supported;During functional activity Standing balance-Leahy Scale: Poor Standing balance comment: Requires at least one HHA for stability.                      Cognition Arousal/Alertness: Awake/alert Behavior During Therapy: Flat affect;Impulsive;Anxious;Agitated             Safety/Judgement: Decreased awareness of deficits;Decreased awareness of safety          Exercises      General Comments        Pertinent Vitals/Pain Pain Assessment: No/denies pain  See comments above for O2 sats.  Other VSS.  Home Living                      Prior Function            PT Goals (current goals can now be found in the care plan section) Progress towards PT goals: Progressing toward goals    Frequency  Min 3X/week    PT Plan Current plan remains appropriate    Co-evaluation             End of Session Equipment Utilized During Treatment: Gait belt;Oxygen Activity Tolerance: Patient limited by fatigue Patient left: in  chair;with call bell/phone within reach;Other (comment) (with MD in room. )     Time: 5486-2824 PT Time Calculation (min) (ACUTE ONLY): 15 min  Charges:  $Gait Training: 8-22 mins                    G Codes:      Denice Paradise Sep 27, 2015, 1:22 PM [place] Robyne Matar,PT Acute Rehabilitation [phone] (pager)

## 2015-09-16 ENCOUNTER — Inpatient Hospital Stay (HOSPITAL_COMMUNITY): Payer: Medicare Other

## 2015-09-16 DIAGNOSIS — G4733 Obstructive sleep apnea (adult) (pediatric): Secondary | ICD-10-CM | POA: Diagnosis present

## 2015-09-16 DIAGNOSIS — J90 Pleural effusion, not elsewhere classified: Secondary | ICD-10-CM

## 2015-09-16 LAB — PULMONARY FUNCTION TEST
FEF 25-75 PRE: 0.68 L/s
FEF2575-%PRED-PRE: 34 %
FEV1-%Change-Post: -39 %
FEV1-%PRED-POST: 27 %
FEV1-%Pred-Pre: 46 %
FEV1-Post: 0.67 L
FEV1-Pre: 1.12 L
FEV1FVC-%Change-Post: -9 %
FEV1FVC-%Pred-Pre: 84 %
FEV6-%CHANGE-POST: -33 %
FEV6-%PRED-POST: 37 %
FEV6-%PRED-PRE: 57 %
FEV6-PRE: 1.74 L
FEV6-Post: 1.15 L
FEV6FVC-%PRED-PRE: 104 %
FEV6FVC-%Pred-Post: 104 %
FVC-%Change-Post: -33 %
FVC-%Pred-Post: 36 %
FVC-%Pred-Pre: 54 %
FVC-Post: 1.15 L
FVC-Pre: 1.74 L
POST FEV6/FVC RATIO: 100 %
Post FEV1/FVC ratio: 58 %
Pre FEV1/FVC ratio: 64 %
Pre FEV6/FVC Ratio: 100 %

## 2015-09-16 LAB — GLUCOSE, CAPILLARY
GLUCOSE-CAPILLARY: 159 mg/dL — AB (ref 65–99)
GLUCOSE-CAPILLARY: 184 mg/dL — AB (ref 65–99)
GLUCOSE-CAPILLARY: 194 mg/dL — AB (ref 65–99)
GLUCOSE-CAPILLARY: 213 mg/dL — AB (ref 65–99)

## 2015-09-16 LAB — BASIC METABOLIC PANEL
ANION GAP: 11 (ref 5–15)
BUN: 27 mg/dL — ABNORMAL HIGH (ref 6–20)
CHLORIDE: 82 mmol/L — AB (ref 101–111)
CO2: 42 mmol/L — AB (ref 22–32)
Calcium: 8.8 mg/dL — ABNORMAL LOW (ref 8.9–10.3)
Creatinine, Ser: 0.94 mg/dL (ref 0.44–1.00)
GFR calc non Af Amer: 60 mL/min — ABNORMAL LOW (ref >60)
GLUCOSE: 199 mg/dL — AB (ref 65–99)
Potassium: 3.8 mmol/L (ref 3.5–5.1)
Sodium: 135 mmol/L (ref 135–145)

## 2015-09-16 MED ORDER — POTASSIUM CHLORIDE CRYS ER 20 MEQ PO TBCR
40.0000 meq | EXTENDED_RELEASE_TABLET | Freq: Once | ORAL | Status: AC
  Administered 2015-09-16: 40 meq via ORAL
  Filled 2015-09-16: qty 2

## 2015-09-16 MED ORDER — METOLAZONE 2.5 MG PO TABS
2.5000 mg | ORAL_TABLET | Freq: Once | ORAL | Status: AC
  Administered 2015-09-16: 2.5 mg via ORAL
  Filled 2015-09-16: qty 1

## 2015-09-16 MED ORDER — ALBUTEROL SULFATE (2.5 MG/3ML) 0.083% IN NEBU
2.5000 mg | INHALATION_SOLUTION | Freq: Once | RESPIRATORY_TRACT | Status: AC
  Administered 2015-09-16: 2.5 mg via RESPIRATORY_TRACT

## 2015-09-16 NOTE — Progress Notes (Signed)
Inpatient Diabetes Program Recommendations  AACE/ADA: New Consensus Statement on Inpatient Glycemic Control (2015)  Target Ranges:  Prepandial:   less than 140 mg/dL      Peak postprandial:   less than 180 mg/dL (1-2 hours)      Critically ill patients:  140 - 180 mg/dL   Review of Glycemic Control Results for Jillian Bright, Jillian Bright (MRN [id number]) as of 09/16/2015 09:10  Ref. Range 09/14/2015 21:11 09/15/2015 08:41 09/15/2015 12:29 09/15/2015 17:04 09/15/2015 21:19  Glucose-Capillary Latest Ref Range: 65-99 mg/dL 176 (H) 151 (H) 212 (H) 300 (H) 267 (H)     Inpatient Diabetes Program Recommendations: Noted Novolog @ 3 units for meal coverage. Post prandial glucose is consistently elevated. Please consider increase of Novolog to 4 units TID with meals for meal coverage and increase in Lantus to 18 units daily (0.2 units/kg).  Thank you, Nani Gasser. Wells Mabe, RN, MSN, CDE Inpatient Glycemic Control Team Team Pager [phone] (8am-5pm) 09/16/2015 9:23 AM

## 2015-09-16 NOTE — Clinical Social Work Note (Signed)
CSW was informed that patient is not medically ready for discharge yet per bedside nurse.  CSW continuing to follow patient's progress, plan is for patient to eventually go to [facility] for short term rehab.  Jones Broom. [place], MSW, [place] 09/16/2015 3:36 PM

## 2015-09-16 NOTE — Progress Notes (Signed)
Name: Jillian Bright MRN: [id number] DOB: September 07, 1945    ADMISSION DATE:  09/07/2015 CONSULTATION DATE:  4/19  REFERRING MD :  [facility]   CHIEF COMPLAINT:  Lung nodule   BRIEF PATIENT DESCRIPTION: 70yo morbidly obese female former smoker (quit 2009) with multiple medical problems including COPD, OSA (intolerant to CPAP), Pulmonary HTN (PA peak pressure 61 mmHg), dCHF, Severe AS, CAD, chronic respiratory failure on home O2 initially presented 4/12 with 3 week hx BLE edema, SOB, orthopnea.  She was admitted, diuresed and seen in consultation by CVTS for possible aortic valve repair/replacement.  CTA chest to r/o PE revealed enlarging LUL pulmonary nodule and PCCM consulted.   SIGNIFICANT EVENTS  4/12 admitted for SOB 4/19 PCCM consulted for enlarging LUL nodule.   STUDIES:  CTA chest 4/19>>>Negative for pulmonary embolus.  Left upper lobe pulmonary nodule has enlarged since the prior CT scan and could be a bronchogenic carcinoma. PET CT scan is recommended for further evaluation.  Moderate right pleural effusion with associated compressive atelectasis. 2D echo 4/13>>> 48-54%, grade 1 diastolic dysfunction, severe AS, mild MR, severely dilated RA, PA peak pressure 41mHg  HISTORY OF PRESENT ILLNESS:  69yo morbidly obese female former smoker (quit 2009) with multiple medical problems including COPD, OSA (intolerant to CPAP), Pulmonary HTN (PA peak pressure 61 mmHg), dCHF, Severe AS, CAD, chronic respiratory failure on home O2 initially presented 4/12 with 3 week hx BLE edema, SOB, orthopnea.  She was admitted, diuresed and seen in consultation by CVTS for possible aortic valve repair/replacement.  CTA chest to r/o PE revealed enlarging LUL pulmonary nodule and PCCM consulted.    SUBJECTIVE:  Some better but is still SOB. Better o2 sats.    VITAL SIGNS: Temp:  [97.8 F (36.6 C)-98.9 F (37.2 C)] 97.8 F (36.6 C) (04/21 1215) Pulse Rate:  [92-100] 96 (04/21 1215) Resp:  [9-21] 18 (04/21  1215) BP: (90-112)/(54-82) 103/69 mmHg (04/21 1215) SpO2:  [88 %-98 %] 88 % (04/21 1215) FiO2 (%):  [4 %] 4 % (04/20 2021) Weight:  [199 lb 11.2 oz (90.583 kg)] 199 lb 11.2 oz (90.583 kg) (04/21 0628)  PHYSICAL EXAMINATION: General:  Chronically ill appearing female, NAD  Neuro:  Awake, alert, appropriate, MAE  HEENT:  Mm moist, no JVD  Cardiovascular:  s1s2 rrr Lungs:  resps even non labored at rest on 4L [place], bibasilar crackles Abdomen:  Round, soft, +bs  Musculoskeletal:  Warm and dry, 3+ BLE edema, erythema     Recent Labs Lab 09/14/15 0402 09/15/15 0557 09/16/15 0535  NA 135 137 135  K 4.4 3.9 3.8  CL 86* 85* 82*  CO2 41* 42* 42*  BUN 26* 27* 27*  CREATININE 1.08* 1.04* 0.94  GLUCOSE 165* 188* 199*    Recent Labs Lab 09/11/15 0325 09/12/15 0302 09/13/15 0442  HGB 6.8* 8.9* 8.8*  HCT 27.5* 33.5* 34.0*  WBC 6.1 6.2 5.9  PLT 148* 134* 134*   No results found.  ASSESSMENT / PLAN:  LUL pulmonary nodule (11.438mx 8.40m5m- slightly increased from previous CT scan in 2015 COPD  OSA - intolerant to CPAP - followed by Dr. SooHalford ChessmanHS Secondary PAHSpeedeffusion with associated RLL collapse Pt with mod-severe AS, RV failure, acute on chronic diastolic HF exacerbation, CAD Acute on Chronic Hypoxemic Hypercapneic Resp Fx 2/2 above  REC -  Bedside US [place] pleural effusion with surrounding lung tissue. Risk of PTX is high. Suggest re evaluate in 1-2 days with diuresis.  Check effusion  on Monday with [place] > if still significant, may attempt bedside thoracentesis or possibly, thoracentesis by radiologist.   Continue diuresis per cards. Agree with aggressive diuresis Continue dulera, PRN SABA  Pulmonary hygiene  She would benefit from CPAP but is unable to tolerate  She is certainly at increased risk for peri-operative pulmonary complications including inability to wean from the ventilator post op, but would favor proceeding with the remainder of the w/u for her aortic  valve repair (including cath, TEE, etc).  Once pt is improved and can be d/c would do outpt PET scan to assist with risk stratification of LUL nodule and possible contribution to staging if this is a primary lung cancer.     Monica Becton, MD 09/16/2015, 3:19 PM [facility] Pager [phone] After 3 pm or if no answer, call [phone]

## 2015-09-16 NOTE — Progress Notes (Signed)
Assessed at bedside with ultrasound for right thoracentesis.  Multiple angles assessed for possible approach and all fields have compressed lung in line of insertion.  She does have a large fluid pocket on ultrasound assessment.  With lung flap in way, concern for high risk pneumothorax.  We can reassess in am for possible thoracentesis.        Noe Gens, NP-C [facility] Pgr: [phone] or if no answer [phone] 09/16/2015, 12:13 PM

## 2015-09-16 NOTE — Progress Notes (Signed)
PROGRESS NOTE    Jillian Bright  RXV:[id number] DOB: [birth year] DOA: 09/07/2015 PCP: [name], MD  Outpatient Specialists:     Brief Narrative: 70 year old female with history of diabetes mellitus, COPD, sleep apnea, pulmonary hypertension, obesity, history of coronary artery disease, congestive heart failure, who presents to the ER with worsening bilateral lower extremity swelling and shortness of breath with orthopnea.Patient is known history of COPD Oxygen ,3 liters at baseline as well as history of diastolic heart failure last echogram was in 2015 showing EF of 50%-93 diastolic,chronic 1 diastolic heart failure and moderate aortic stenosis. Patient found to have left upper lobe pulmonary nodule which has enlarged since prior CT from 2015.patient was started on dopamine by cardiology, which has now been discontinued.   Assessment & Plan:   Principal Problem:   Acute on chronic diastolic (congestive) heart failure (HCC) Active Problems:   Essential hypertension   Pulmonary HTN (HCC)   Chronic diastolic heart failure (HCC)   COPD with emphysema (HCC)   Obesity hypoventilation syndrome (HCC)   CAD (coronary artery disease)   Aortic stenosis   DM type 2 (diabetes mellitus, type 2) (HCC)   On home oxygen therapy   Microcytic anemia   CHF (congestive heart failure) (HCC)   CHF exacerbation (HCC)   Abnormal chest x-ray   Acute on chronic congestive heart failure (HCC)   Morbid obesity (HCC)   Pleural effusion on right   Tricuspid regurgitation   Right-sided congestive heart failure (HCC)   Mitral regurgitation   Acute hypoxemic respiratory failure (HCC)   Acute on chronic diastolic CHF (congestive heart failure), NYHA class 1 ([place]) - also likely secondary to noncompliance - patient admitted to not taking lasix prior to admission because she did not feel she needed it Continue telemetry Serial trop remained had stable at 0.04. Suspect troponin leak in the setting of acute  CHF Repeat 2-D echo with EF 60-65% with mod-severe AS  Cont to hold off on ACE/ARBi due to renal insufficiency  Wt today 91.17kg<-90.8kg yesterday   Net neg 8.4L since admit Currently patient on IV Lasix for aggressive diuresis per cardiology.  Pleural effusion Pulmonary following for possible thoracentesis.   Lung nodule CT angiogram showed left upper lobe pulmonary nodule which has enlarged since the prior CT from 2015. PET/CT recommended for further evaluation Pulmonary was consulted for further evaluation.At this time pulmonology recommends outpatient PET/CT.  Severe AS  Valve area <1cm  Consulted CT Surgery Will need LHC and RHC if she goes to surgery  . CAD (coronary artery disease) chronic continue home medications  . Essential hypertension chronic continue home medications   . Microcytic anemia Plan to cont to transfuse for hemoglobin below 7 or if significantly symptomatic. Given IV iron. Pt is iron deficient . Patient will need ferrous sulfate on discharge and possible GI evaluation as outpatient - Hgb down to 6.9 on 4/16, appropriately responded to 2 units of PRBC's -Hgb remains stable  . COPD with emphysema ([place]) stable continue home oxygen and inhalers  Diabetes mellitus hold by mouth medications order sliding scale, cont low-dose Lantus  Leg edema suspect likely secondary to heart failure. LE dopplers neg for DVT  Constipation: Reports no BM for one week. Pt now having BM with cathartics.  DVT prophylaxis: SCD's Code Status:DNR Family Communication: No family present at bedside Disposition Plan: Pending workup for lung nodule and treatment for severe aortic stenosis   Consultants:   CT surgery  Cardiology  Procedures:  Antimicrobials:    Subjective: Patient seen and examined. She denies chest pain or shortness of breath.   Objective: Filed Vitals:   09/16/15 8182 09/16/15 0742 09/16/15 0926 09/16/15 1215  BP:  90/54  103/69  Pulse:   96  96  Temp:  98 F (36.7 C)  97.8 F (36.6 C)  TempSrc:  Oral  Oral  Resp:  12  18  Height:      Weight: 90.583 kg (199 lb 11.2 oz)     SpO2:  90% 95% 88%    Intake/Output Summary (Last 24 hours) at 09/16/15 1336 Last data filed at 09/16/15 1300  Gross per 24 hour  Intake    600 ml  Output   2825 ml  Net  -2225 ml   Filed Weights   09/14/15 0233 09/15/15 0500 09/16/15 0628  Weight: 92.216 kg (203 lb 4.8 oz) 94.1 kg (207 lb 7.3 oz) 90.583 kg (199 lb 11.2 oz)    Examination:  General exam: Appears calm and comfortable, sitting in chair  Respiratory system: Clear to auscultation. Respiratory effort normal. Cardiovascular system: S1 & S2 heard, RRR. No JVD, murmurs, rubs, gallops or clicks.  Gastrointestinal system: Abdomen is nontender. No organomegaly or masses felt. Posl bowel sounds Central nervous system: Alert and oriented. No focal neurological deficits. Extremities: Symmetric 5 x 5 power. B LE pitting edema Skin: No rashes, lesions or ulcers Psychiatry: Judgement and insight appear normal. Mood & affect appropriate.     Data Reviewed: I have personally reviewed following labs and imaging studies  CBC:  Recent Labs Lab 09/11/15 0325 09/12/15 0302 09/13/15 0442  WBC 6.1 6.2 5.9  HGB 6.8* 8.9* 8.8*  HCT 27.5* 33.5* 34.0*  MCV 72.8* 76.7* 77.8*  PLT 148* 134* 993*   Basic Metabolic Panel:  Recent Labs Lab 09/12/15 0302 09/13/15 0442 09/14/15 0402 09/15/15 0557 09/16/15 0535  NA 134* 137 135 137 135  K 4.1 4.5 4.4 3.9 3.8  CL 81* 84* 86* 85* 82*  CO2 41* 41* 41* 42* 42*  GLUCOSE 220* 202* 165* 188* 199*  BUN 27* 29* 26* 27* 27*  CREATININE 1.25* 1.12* 1.08* 1.04* 0.94  CALCIUM 7.0* 7.6* 8.3* 8.5* 8.8*   GFR: Estimated Creatinine Clearance: 62.2 mL/min (by C-G formula based on Cr of 0.94). Liver Function Tests:  Recent Labs Lab 09/14/15 0402  AST 23  ALT 17  ALKPHOS 53  BILITOT 1.2  PROT 6.7  ALBUMIN 3.3*   CBG:  Recent Labs Lab  09/15/15 1229 09/15/15 1704 09/15/15 2119 09/16/15 0748 09/16/15 1220  GLUCAP 212* 300* 267* 159* 184*   Urine analysis:    Component Value Date/Time   COLORURINE YELLOW 09/13/2015 2230   APPEARANCEUR CLEAR 09/13/2015 2230   LABSPEC 1.031* 09/13/2015 2230   PHURINE 6.0 09/13/2015 2230   GLUCOSEU NEGATIVE 09/13/2015 2230   HGBUR NEGATIVE 09/13/2015 2230   HGBUR negative 08/04/2008 1526   BILIRUBINUR NEGATIVE 09/13/2015 2230   BILIRUBINUR n 11/24/2012 1335   KETONESUR NEGATIVE 09/13/2015 2230   PROTEINUR NEGATIVE 09/13/2015 2230   PROTEINUR trace 11/24/2012 1335   UROBILINOGEN 0.2 11/18/2014 0750   UROBILINOGEN 0.2 11/24/2012 1335   NITRITE NEGATIVE 09/13/2015 2230   NITRITE n 11/24/2012 1335   LEUKOCYTESUR NEGATIVE 09/13/2015 2230    Radiology Studies: No results found.      Scheduled Meds: . sodium chloride   Intravenous Once  . aspirin  81 mg Oral Daily  . atorvastatin  20 mg Oral Daily  . darifenacin  7.5 mg Oral Daily  . DULoxetine  60 mg Oral Daily  . furosemide  80 mg Intravenous BID  . gabapentin  300 mg Oral TID  . insulin aspart  0-5 Units Subcutaneous QHS  . insulin aspart  0-9 Units Subcutaneous TID WC  . insulin aspart  3 Units Subcutaneous TID WC  . insulin glargine  5 Units Subcutaneous QHS  . mometasone-formoterol  2 puff Inhalation BID  . pantoprazole  40 mg Oral Daily  . polyethylene glycol  17 g Oral Daily  . sodium chloride flush  3 mL Intravenous Q12H  . spironolactone  12.5 mg Oral Daily  . traZODone  50 mg Oral QHS   Continuous Infusions:     LOS: 9 days    Oswald Hillock, MD [facility] Pager [phone]  If 7PM-7AM, please contact night-coverage [email or web address] Password [email or web address] 09/16/2015, 1:36 PM

## 2015-09-16 NOTE — Progress Notes (Signed)
Patient ID: Jillian Bright, female   DOB: Oct 07, 1945, 70 y.o.   MRN: [id number]    Advanced Heart Failure Rounding Note   Subjective:   70 year old female history of coronary artery disease, congestive heart failure, diabetes mellitus, oxygen dependent COPD, obstructive sleep apnea-intolerant to CPAP, pulmonary hypertension, obesity, hyperlipidemia. She was last seen by Dr. Aundra Dubin in August 2013. She initially presented with peripheral edema in 2009 and had a normal stress test and preserved EF on echocardiogram. She had a right heart catheterization which showed mild/moderate pulmonary hypertension which responded well to oxygen with decreased PA pressure. She had 2-D echocardiogram on 09/08/2015 revealing normal ejection fraction of 60-65% and wall motion. Grade 1 diastolic dysfunction. Moderate to severe aortic stenosis where previously was moderate. Mild MR. Left atrium mildly dilated. Peak PA pressure 61 mmHg. Right atrium moderately to severely dilated. Moderate tricuspid valve regurgitation.  Some diuresis with Lasix yesterday and weight coming down.   09/13/15 CTA: negative for PE, enlarging lung nodule noted.  Objective:   Weight Range:  Vital Signs:   Temp:  [97.7 F (36.5 C)-98.9 F (37.2 C)] 98 F (36.7 C) (04/21 0742) Pulse Rate:  [92-101] 96 (04/21 0742) Resp:  [9-21] 12 (04/21 0742) BP: (90-112)/(54-82) 90/54 mmHg (04/21 0742) SpO2:  [90 %-98 %] 90 % (04/21 0742) FiO2 (%):  [4 %] 4 % (04/20 2021) Weight:  [199 lb 11.2 oz (90.583 kg)] 199 lb 11.2 oz (90.583 kg) (04/21 0628) Last BM Date: 09/14/15  Weight change: Filed Weights   09/14/15 0233 09/15/15 0500 09/16/15 0628  Weight: 203 lb 4.8 oz (92.216 kg) 207 lb 7.3 oz (94.1 kg) 199 lb 11.2 oz (90.583 kg)    Intake/Output:   Intake/Output Summary (Last 24 hours) at 09/16/15 0850 Last data filed at 09/16/15 0000  Gross per 24 hour  Intake   1560 ml  Output   2700 ml  Net  -1140 ml     Physical  Exam: General: NAD. No resp difficulty. In bed.  HEENT: normal Neck: supple. JVP 14-16 cm. Carotids 2+ bilat; no bruits. No lymphadenopathy or thryomegaly appreciated. Cor: PMI nondisplaced. Regular rate & rhythm. No rubs, gallops.  3/6 crescendo-decrescendo murmur RUSB with muffled S2. Lungs: RML RLL decreased. On 3 liters oxygen Abdomen: soft, nontender, nondistended. No hepatosplenomegaly. No bruits or masses. Good bowel sounds. Extremities: no cyanosis, clubbing, rash.  R and LLE 3+ edema to knees. Erythema noted RLE LLE Pink  Neuro: alert & orientedx3, cranial nerves grossly intact. moves all 4 extremities w/o difficulty. Affect pleasant  Telemetry: ST 100s   Labs: Basic Metabolic Panel:  Recent Labs Lab 09/12/15 0302 09/13/15 0442 09/14/15 0402 09/15/15 0557 09/16/15 0535  NA 134* 137 135 137 135  K 4.1 4.5 4.4 3.9 3.8  CL 81* 84* 86* 85* 82*  CO2 41* 41* 41* 42* 42*  GLUCOSE 220* 202* 165* 188* 199*  BUN 27* 29* 26* 27* 27*  CREATININE 1.25* 1.12* 1.08* 1.04* 0.94  CALCIUM 7.0* 7.6* 8.3* 8.5* 8.8*    Liver Function Tests:  Recent Labs Lab 09/14/15 0402  AST 23  ALT 17  ALKPHOS 53  BILITOT 1.2  PROT 6.7  ALBUMIN 3.3*   No results for input(s): LIPASE, AMYLASE in the last 168 hours. No results for input(s): AMMONIA in the last 168 hours.  CBC:  Recent Labs Lab 09/11/15 0325 09/12/15 0302 09/13/15 0442  WBC 6.1 6.2 5.9  HGB 6.8* 8.9* 8.8*  HCT 27.5* 33.5* 34.0*  MCV 72.8* 76.7*  77.8*  PLT 148* 134* 134*    Cardiac Enzymes: No results for input(s): CKTOTAL, CKMB, CKMBINDEX, TROPONINI in the last 168 hours.  BNP: BNP (last 3 results)  Recent Labs  11/18/14 1015 09/07/15 2000  BNP 176.5* 848.6*    ProBNP (last 3 results) No results for input(s): PROBNP in the last 8760 hours.    Other results:  Imaging: No results found.   Medications:     Scheduled Medications: . sodium chloride   Intravenous Once  . aspirin  81 mg Oral Daily   . atorvastatin  20 mg Oral Daily  . darifenacin  7.5 mg Oral Daily  . DULoxetine  60 mg Oral Daily  . furosemide  80 mg Intravenous BID  . gabapentin  300 mg Oral TID  . insulin aspart  0-5 Units Subcutaneous QHS  . insulin aspart  0-9 Units Subcutaneous TID WC  . insulin aspart  3 Units Subcutaneous TID WC  . insulin glargine  5 Units Subcutaneous QHS  . metolazone  2.5 mg Oral Once  . mometasone-formoterol  2 puff Inhalation BID  . pantoprazole  40 mg Oral Daily  . polyethylene glycol  17 g Oral Daily  . potassium chloride  40 mEq Oral Once  . sodium chloride flush  3 mL Intravenous Q12H  . spironolactone  12.5 mg Oral Daily  . traZODone  50 mg Oral QHS    Infusions:    PRN Medications: sodium chloride, ALPRAZolam, ipratropium-albuterol, ondansetron (ZOFRAN) IV, oxyCODONE-acetaminophen, sodium chloride flush, sodium chloride flush   Assessment/Plan/Discussion     1. 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 Echo 4/17 with EF 60-65%, dilated and dysfunctional RV, PASP 61 mmHg, moderate to severe AS with mean gradient 34 and AVA 0.9 cm^2, moderate to severe TR. On exam, she is still markedly volume overloaded. She needs extensive diuresis. CVP 19 today with co-ox 66%.  - Continue Lasix 80 mg IV bid, will add metolazone 2.5 mg po x 1 today.  - RHC when more fully diuresed, possibly Monday.  - Significant right pleural effusion.  Think we can help her breathing with a therapeutic thoracentesis and help prepare her for sedation and procedures next week.  We discussed this again, will ask pulmonary to proceed.  2. Aortic stenosis: Possibly severe. Clearly progressed from prior echo (several years ago). She will need TEE to assess more closely. Will hold off on this until she is fully diuresed as will also do RHC/LHC (probably Monday). Treatment of the aortic valve will be tricky. Anesthesia will be quite risky given her pulmonary  disease (COPD and OHS/OSA). I think that she will do better with TAVR than with open AVR. There is concern that TAVR will not address the TR, however this may improve with diuresis and decreased RVEDP. Per pulmonary, high risk for anesthesia. 3. Pulmonary hypertension: She has a history of pulmonary hypertension thought to be due to OHS/OSA. Suspect her [place] currently is a combination of OHS/OSA and pulmonary venous hypertension from volume overload.  - Will diurese, then RHC.  4. OHS/OSA: Cannot tolerate CPAP at night. She is on home O2 chronically.  5. COPD: On home O2, no longer smokes.  6. CAD: h/o RCA PCI. Will need LHC with RHC prior to any consideration of valve intervention. Will do this after she is more fully diuresed (probably Monday). Continue ASA 81 and statin.  7. Tricuspid regurgitation: Moderate to severe. Will reassess by TEE after more  diuresis. This may improve with diuresis.  8. Pulmonary nodule: Enlarging pulmonary nodule concerning for bronchogenic carcinoma, especially with smoking history. This will have to be addressed after her heart issues are resolved. Will need PET scan after cardiac issues stabilized.   Loralie Champagne 09/16/2015 8:50 AM

## 2015-09-17 DIAGNOSIS — J438 Other emphysema: Secondary | ICD-10-CM

## 2015-09-17 DIAGNOSIS — J449 Chronic obstructive pulmonary disease, unspecified: Secondary | ICD-10-CM | POA: Insufficient documentation

## 2015-09-17 DIAGNOSIS — C3412 Malignant neoplasm of upper lobe, left bronchus or lung: Secondary | ICD-10-CM | POA: Diagnosis present

## 2015-09-17 LAB — BASIC METABOLIC PANEL
Anion gap: 13 (ref 5–15)
BUN: 26 mg/dL — AB (ref 6–20)
CALCIUM: 9.1 mg/dL (ref 8.9–10.3)
CO2: 40 mmol/L — AB (ref 22–32)
CREATININE: 0.99 mg/dL (ref 0.44–1.00)
Chloride: 81 mmol/L — ABNORMAL LOW (ref 101–111)
GFR calc Af Amer: >60 mL/min (ref >60)
GFR, EST NON AFRICAN AMERICAN: 56 mL/min — AB (ref >60)
GLUCOSE: 201 mg/dL — AB (ref 65–99)
Potassium: 3.8 mmol/L (ref 3.5–5.1)
Sodium: 134 mmol/L — ABNORMAL LOW (ref 135–145)

## 2015-09-17 LAB — GLUCOSE, CAPILLARY
GLUCOSE-CAPILLARY: 272 mg/dL — AB (ref 65–99)
GLUCOSE-CAPILLARY: 277 mg/dL — AB (ref 65–99)
Glucose-Capillary: 163 mg/dL — ABNORMAL HIGH (ref 65–99)
Glucose-Capillary: 172 mg/dL — ABNORMAL HIGH (ref 65–99)

## 2015-09-17 LAB — CBC
HEMATOCRIT: 34.6 % — AB (ref 36.0–46.0)
HEMOGLOBIN: 9.3 g/dL — AB (ref 12.0–15.0)
MCH: 21.2 pg — ABNORMAL LOW (ref 26.0–34.0)
MCHC: 26.9 g/dL — AB (ref 30.0–36.0)
MCV: 79 fL (ref 78.0–100.0)
Platelets: 160 10*3/uL (ref 150–400)
RBC: 4.38 MIL/uL (ref 3.87–5.11)
RDW: 21 % — AB (ref 11.5–15.5)
WBC: 5.5 10*3/uL (ref 4.0–10.5)

## 2015-09-17 MED ORDER — METOLAZONE 5 MG PO TABS
5.0000 mg | ORAL_TABLET | Freq: Once | ORAL | Status: AC
  Administered 2015-09-17: 5 mg via ORAL
  Filled 2015-09-17: qty 1

## 2015-09-17 MED ORDER — POTASSIUM CHLORIDE CRYS ER 20 MEQ PO TBCR
40.0000 meq | EXTENDED_RELEASE_TABLET | Freq: Once | ORAL | Status: AC
  Administered 2015-09-17: 40 meq via ORAL
  Filled 2015-09-17: qty 2

## 2015-09-17 NOTE — Progress Notes (Addendum)
Patient ID: Jillian Bright, female   DOB: 16-Jun-1945, 70 y.o.   MRN: [id number]    Advanced Heart Failure Rounding Note   Subjective:   70 year old female history of coronary artery disease, congestive heart failure, diabetes mellitus, oxygen dependent COPD, obstructive sleep apnea-intolerant to CPAP, pulmonary hypertension, obesity, hyperlipidemia. She was last seen by Dr. Aundra Dubin in August 2013. She initially presented with peripheral edema in 2009 and had a normal stress test and preserved EF on echocardiogram. She had a right heart catheterization which showed mild/moderate pulmonary hypertension which responded well to oxygen with decreased PA pressure. She had 2-D echocardiogram on 09/08/2015 revealing normal ejection fraction of 60-65% and wall motion. Grade 1 diastolic dysfunction. Moderate to severe aortic stenosis where previously was moderate. Mild MR. Left atrium mildly dilated. Peak PA pressure 61 mmHg. Right atrium moderately to severely dilated. Moderate tricuspid valve regurgitation.  She diuresed yesterday with Lasix/metolazone, weight down 2 lbs.    Bedside thoracentesis not done due to higher risk from position of lung tissue.   09/13/15 CTA: negative for PE, enlarging lung nodule noted.  Objective:   Weight Range:  Vital Signs:   Temp:  [97.7 F (36.5 C)-98.2 F (36.8 C)] 97.7 F (36.5 C) (04/22 0420) Pulse Rate:  [87-107] 106 (04/22 0600) Resp:  [11-23] 16 (04/22 0600) BP: (96-127)/(58-79) 96/58 mmHg (04/22 0420) SpO2:  [87 %-96 %] 93 % (04/22 0600) Weight:  [197 lb 8.5 oz (89.6 kg)] 197 lb 8.5 oz (89.6 kg) (04/22 0420) Last BM Date: 09/14/15  Weight change: Filed Weights   09/15/15 0500 09/16/15 0628 09/17/15 0420  Weight: 207 lb 7.3 oz (94.1 kg) 199 lb 11.2 oz (90.583 kg) 197 lb 8.5 oz (89.6 kg)    Intake/Output:   Intake/Output Summary (Last 24 hours) at 09/17/15 0747 Last data filed at 09/17/15 0300  Gross per 24 hour  Intake    300 ml  Output    2875 ml  Net  -2575 ml     Physical Exam: General: NAD. No resp difficulty. In bed.  HEENT: normal Neck: supple. JVP 14-16 cm. Carotids 2+ bilat; no bruits. No lymphadenopathy or thryomegaly appreciated. Cor: PMI nondisplaced. Regular rate & rhythm. No rubs, gallops.  3/6 crescendo-decrescendo murmur RUSB with muffled S2. Lungs: RML RLL decreased. On 3 liters oxygen Abdomen: soft, nontender, nondistended. No hepatosplenomegaly. No bruits or masses. Good bowel sounds. Extremities: no cyanosis, clubbing, rash.  R and LLE 2+ edema to knees. Erythema noted RLE LLE Pink  Neuro: alert & orientedx3, cranial nerves grossly intact. moves all 4 extremities w/o difficulty. Affect pleasant  Telemetry: ST 100s   Labs: Basic Metabolic Panel:  Recent Labs Lab 09/13/15 0442 09/14/15 0402 09/15/15 0557 09/16/15 0535 09/17/15 0445  NA 137 135 137 135 134*  K 4.5 4.4 3.9 3.8 3.8  CL 84* 86* 85* 82* 81*  CO2 41* 41* 42* 42* 40*  GLUCOSE 202* 165* 188* 199* 201*  BUN 29* 26* 27* 27* 26*  CREATININE 1.12* 1.08* 1.04* 0.94 0.99  CALCIUM 7.6* 8.3* 8.5* 8.8* 9.1    Liver Function Tests:  Recent Labs Lab 09/14/15 0402  AST 23  ALT 17  ALKPHOS 53  BILITOT 1.2  PROT 6.7  ALBUMIN 3.3*   No results for input(s): LIPASE, AMYLASE in the last 168 hours. No results for input(s): AMMONIA in the last 168 hours.  CBC:  Recent Labs Lab 09/11/15 0325 09/12/15 0302 09/13/15 0442 09/17/15 0445  WBC 6.1 6.2 5.9 5.5  HGB 6.8*  8.9* 8.8* 9.3*  HCT 27.5* 33.5* 34.0* 34.6*  MCV 72.8* 76.7* 77.8* 79.0  PLT 148* 134* 134* 160    Cardiac Enzymes: No results for input(s): CKTOTAL, CKMB, CKMBINDEX, TROPONINI in the last 168 hours.  BNP: BNP (last 3 results)  Recent Labs  11/18/14 1015 09/07/15 2000  BNP 176.5* 848.6*    ProBNP (last 3 results) No results for input(s): PROBNP in the last 8760 hours.    Other results:  Imaging: No results found.   Medications:     Scheduled  Medications: . sodium chloride   Intravenous Once  . aspirin  81 mg Oral Daily  . atorvastatin  20 mg Oral Daily  . darifenacin  7.5 mg Oral Daily  . DULoxetine  60 mg Oral Daily  . furosemide  80 mg Intravenous BID  . gabapentin  300 mg Oral TID  . insulin aspart  0-5 Units Subcutaneous QHS  . insulin aspart  0-9 Units Subcutaneous TID WC  . insulin aspart  3 Units Subcutaneous TID WC  . insulin glargine  5 Units Subcutaneous QHS  . metolazone  5 mg Oral Once  . mometasone-formoterol  2 puff Inhalation BID  . pantoprazole  40 mg Oral Daily  . polyethylene glycol  17 g Oral Daily  . potassium chloride  40 mEq Oral Once  . sodium chloride flush  3 mL Intravenous Q12H  . spironolactone  12.5 mg Oral Daily  . traZODone  50 mg Oral QHS    Infusions:    PRN Medications: sodium chloride, ALPRAZolam, ipratropium-albuterol, ondansetron (ZOFRAN) IV, oxyCODONE-acetaminophen, sodium chloride flush, sodium chloride flush   Assessment/Plan/Discussion     1. 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 Echo 4/17 with EF 60-65%, dilated and dysfunctional RV, PASP 61 mmHg, moderate to severe AS with mean gradient 34 and AVA 0.9 cm^2, moderate to severe TR. On exam, she is still volume overloaded though weight coming down with diuresis.  - Continue Lasix 80 mg IV bid, will add metolazone 5 mg po x 1 today.  - RHC when more fully diuresed, plan for Monday.  - Significant right pleural effusion.  Given position of lung tissue on [place], thoracentesis was not done.  - Will place unna boots.  2. Aortic stenosis: Possibly severe. Clearly progressed from prior echo (several years ago). She will need TEE to assess more closely. Will hold off on this until she is fully diuresed as will also do RHC/LHC (probably Monday). Treatment of the aortic valve will be tricky. Anesthesia will be quite risky given her pulmonary disease (COPD and OHS/OSA). I think that  she will do better with TAVR than with open AVR. There is concern that TAVR will not address the TR, however this may improve with diuresis and decreased RVEDP. Per pulmonary, high risk for anesthesia. 3. Pulmonary hypertension: She has a history of pulmonary hypertension thought to be due to OHS/OSA. Suspect her [place] currently is a combination of OHS/OSA and pulmonary venous hypertension from volume overload.  - Will diurese, then RHC.  4. OHS/OSA: Cannot tolerate CPAP at night. She is on home O2 chronically.  5. COPD: On home O2, no longer smokes.  6. CAD: h/o RCA PCI. Will need LHC with RHC prior to any consideration of valve intervention. Will do this after she is more fully diuresed (probably Monday). Continue ASA 81 and statin.  7. Tricuspid regurgitation: Moderate to severe. Will reassess by TEE after more diuresis. This  may improve with diuresis.  8. Pulmonary nodule: Enlarging pulmonary nodule concerning for bronchogenic carcinoma, especially with smoking history. This will have to be addressed after her heart issues are resolved. Will need PET scan after cardiac issues stabilized.   Loralie Champagne 09/17/2015 7:47 AM

## 2015-09-17 NOTE — Progress Notes (Signed)
Orthopedic Tech Progress Note Patient Details:  Jillian Bright Jan 17, 1946 [id number]  Ortho Devices Type of Ortho Device: Ace wrap, Unna boot Ortho Device/Splint Location: Bilateral unna boots Ortho Device/Splint Interventions: Application   [place] Pink 09/17/2015, 12:08 PM

## 2015-09-17 NOTE — Progress Notes (Signed)
Name: Jillian Bright MRN: [id number] DOB: 27-Apr-1946    ADMISSION DATE:  09/07/2015 CONSULTATION DATE:  4/19  REFERRING MD :  [facility]   CHIEF COMPLAINT:  Lung nodule   BRIEF PATIENT DESCRIPTION: 70yo morbidly obese female former smoker (quit 2009) with multiple medical problems including COPD, OSA (intolerant to CPAP), Pulmonary HTN (PA peak pressure 61 mmHg), dCHF, Severe AS, CAD, chronic respiratory failure on home O2 initially presented 4/12 with 3 week hx BLE edema, SOB, orthopnea.  She was admitted, diuresed and seen in consultation by CVTS for possible aortic valve repair/replacement.  CTA chest to r/o PE revealed enlarging LUL pulmonary nodule and PCCM consulted.   SIGNIFICANT EVENTS  4/12 admitted for SOB 4/19 PCCM consulted for enlarging LUL nodule.   STUDIES:  CTA chest 4/19>>>Negative for pulmonary embolus.  Left upper lobe pulmonary nodule has enlarged since the prior CT scan and could be a bronchogenic carcinoma. PET CT scan is recommended for further evaluation.  Moderate right pleural effusion with associated compressive atelectasis. 2D echo 4/13>>> 17-40%, grade 1 diastolic dysfunction, severe AS, mild MR, severely dilated RA, PA peak pressure 6mHg  HISTORY OF PRESENT ILLNESS:  70yo morbidly obese female former smoker (quit 2009) with multiple medical problems including COPD, OSA (intolerant to CPAP), Pulmonary HTN (PA peak pressure 61 mmHg), dCHF, Severe AS, CAD, chronic respiratory failure on home O2 initially presented 4/12 with 3 week hx BLE edema, SOB, orthopnea.  She was admitted, diuresed and seen in consultation by CVTS for possible aortic valve repair/replacement.  CTA chest to r/o PE revealed enlarging LUL pulmonary nodule and PCCM consulted.    SUBJECTIVE:  diurescing well. Less sob. Feels better.     VITAL SIGNS: Temp:  [97.7 F (36.5 C)-98.7 F (37.1 C)] 98.3 F (36.8 C) (04/22 1146) Pulse Rate:  [87-107] 94 (04/22 1146) Resp:  [10-23] 10 (04/22  1146) BP: (92-127)/(56-79) 92/56 mmHg (04/22 1146) SpO2:  [87 %-96 %] 90 % (04/22 1146) Weight:  [197 lb 8.5 oz (89.6 kg)] 197 lb 8.5 oz (89.6 kg) (04/22 0420)  PHYSICAL EXAMINATION: General:  Chronically ill appearing female, NAD  Neuro:  Awake, alert, appropriate, MAE  HEENT:  Mm moist, no JVD  Cardiovascular:  s1s2 rrr Lungs:  resps even non labored at rest on 4L [place], bibasilar crackles Abdomen:  Round, soft, +bs  Musculoskeletal:  Warm and dry, 3+ BLE edema, erythema     Recent Labs Lab 09/15/15 0557 09/16/15 0535 09/17/15 0445  NA 137 135 134*  K 3.9 3.8 3.8  CL 85* 82* 81*  CO2 42* 42* 40*  BUN 27* 27* 26*  CREATININE 1.04* 0.94 0.99  GLUCOSE 188* 199* 201*    Recent Labs Lab 09/12/15 0302 09/13/15 0442 09/17/15 0445  HGB 8.9* 8.8* 9.3*  HCT 33.5* 34.0* 34.6*  WBC 6.2 5.9 5.5  PLT 134* 134* 160   No results found.  ASSESSMENT / PLAN:  LUL pulmonary nodule (11.423mx 8.12m39m- slightly increased from previous CT scan in 2015 COPD. Severe with an FEV1 of 1.12 or 46% OSA - intolerant to CPAP -  OHS Secondary PAH R effusion with associated RLL collapse Pt with mod-severe AS, RV failure, acute on chronic diastolic HF exacerbation, CAD Acute on Chronic Hypoxemic Hypercapneic Resp Fx 2/2 above  REC -  Diurescing well.  Technically difficult thoracentesis so it was not done.(with lung tissue on the way) Plan for CXR on Monday and evaluate for effusion > if still significant > try [place]  thora again.   Continue diuresis per cards. Agree with aggressive diuresis Continue dulera, PRN SABA  Pulmonary hygiene  She would benefit from CPAP but is unable to tolerate  High risk for surgical procedure > d/w pt.  Outpt f/u for lung nodule (likely CA)   J. Shirl Harris, MD 09/17/2015, 12:06 PM [facility] Pager [phone] After 3 pm or if no answer, call [phone]

## 2015-09-17 NOTE — Progress Notes (Signed)
PROGRESS NOTE    Jillian Bright  GXQ:[id number] DOB: [birth year] DOA: 09/07/2015 PCP: [name], MD  Outpatient Specialists:     Brief Narrative: 70 year old female with history of diabetes mellitus, COPD, sleep apnea, pulmonary hypertension, obesity, history of coronary artery disease, congestive heart failure, who presents to the ER with worsening bilateral lower extremity swelling and shortness of breath with orthopnea.Patient is known history of COPD Oxygen ,3 liters at baseline as well as history of diastolic heart failure last echogram was in 2015 showing EF of 14%-48 diastolic,chronic1 diastolic heart failure and moderate aortic stenosis. Patient found to have left upper lobe pulmonary nodule which has enlarged since prior CT from 2015.patient was started on dopamine by cardiology, which has now been discontinued.   Assessment & Plan:   Principal Problem:   Acute on chronic diastolic (congestive) heart failure (HCC) Active Problems:   Essential hypertension   Pulmonary HTN (HCC)   Chronic diastolic heart failure (HCC)   COPD with emphysema (HCC)   Obesity hypoventilation syndrome (HCC)   CAD (coronary artery disease)   Aortic stenosis   DM type 2 (diabetes mellitus, type 2) (HCC)   On home oxygen therapy   Microcytic anemia   CHF (congestive heart failure) (HCC)   CHF exacerbation (HCC)   Abnormal chest x-ray   Acute on chronic congestive heart failure (HCC)   Morbid obesity (HCC)   Pleural effusion on right   Tricuspid regurgitation   Right-sided congestive heart failure (HCC)   Mitral regurgitation   Acute hypoxemic respiratory failure (HCC)   OSA (obstructive sleep apnea)   Pleural effusion   Chronic obstructive pulmonary disease (HCC)   Lung nodule   Acute on chronic diastolic CHF (congestive heart failure), NYHA class 1 ([place]) - also likely secondary to noncompliance - patient admitted to not taking lasix prior to admission because she did not feel she  needed it Continue telemetry Serial trop remained had stable at 0.04. Suspect troponin leak in the setting of acute CHF Repeat 2-D echo with EF 60-65% with mod-severe AS  Cont to hold off on ACE/ARBi due to renal insufficiency  Wt today 89.6> 91.17kg<-90.8kg   Net neg 15.5 L since admit Currently patient on IV Lasix for aggressive diuresis per cardiology.  Pleural effusion Pulmonary attempted thoracentesis yesterday, unable to perform due to lung tissue on the way. Will attempt again on Monday.  Lung nodule CT angiogram showed left upper lobe pulmonary nodule which has enlarged since the prior CT from 2015. PET/CT recommended for further evaluation Pulmonary was consulted for further evaluation.At this time pulmonology recommends outpatient PET/CT.  Severe AS  Valve area <1cm  Consulted CT Surgery Will need LHC and RHC if she goes to surgery  . CAD (coronary artery disease) chronic continue home medications  . Essential hypertension chronic continue home medications   . Microcytic anemia Plan to cont to transfuse for hemoglobin below 7 or if significantly symptomatic. Given IV iron. Pt is iron deficient . Patient will need ferrous sulfate on discharge and possible GI evaluation as outpatient - Hgb down to 6.9 on 4/16, appropriately responded to 2 units of PRBC's -Hgb remains stable  . COPD with emphysema ([place]) stable continue home oxygen and inhalers  Diabetes mellitus hold by mouth medications order sliding scale, cont low-dose Lantus  Leg edema suspect likely secondary to heart failure. LE dopplers neg for DVT  Constipation: Reports no BM for one week. Pt now having BM with cathartics.  DVT prophylaxis: SCD's Code Status:DNR  Family Communication: No family present at bedside Disposition Plan: Pending workup for lung nodule and treatment for severe aortic stenosis   Consultants:   CT surgery  Cardiology  Procedures:      Antimicrobials:    Subjective: Patient seen and examined this morning. She denies chest pain or shortness of breath. Plan for cardiac cath on Monday.  Objective: Filed Vitals:   09/17/15 0747 09/17/15 0823 09/17/15 0836 09/17/15 1146  BP:  120/77 123/73 92/56  Pulse:  103 103 94  Temp: 98.7 F (37.1 C)   98.3 F (36.8 C)  TempSrc: Oral   Oral  Resp:  '12 15 10  '$ Height:      Weight:      SpO2:  96% 92% 90%    Intake/Output Summary (Last 24 hours) at 09/17/15 1229 Last data filed at 09/17/15 1138  Gross per 24 hour  Intake   1020 ml  Output   4350 ml  Net  -3330 ml   Filed Weights   09/15/15 0500 09/16/15 0628 09/17/15 0420  Weight: 94.1 kg (207 lb 7.3 oz) 90.583 kg (199 lb 11.2 oz) 89.6 kg (197 lb 8.5 oz)    Examination:  General exam: Appears calm and comfortable, sitting in chair  Respiratory system: Bibasilar crackles.Respiratory effort normal. Cardiovascular system: S1 & S2 heard, RRR. No JVD, murmurs, rubs, gallops or clicks.  Gastrointestinal system: Abdomen is nontender. No organomegaly or masses felt. Posl bowel sounds Central nervous system: Alert and oriented. No focal neurological deficits. Extremities: Symmetric 5 x 5 power. B LE pitting edema Skin: No rashes, lesions or ulcers Psychiatry: Judgement and insight appear normal. Mood & affect appropriate.     Data Reviewed: I have personally reviewed following labs and imaging studies  CBC:  Recent Labs Lab 09/11/15 0325 09/12/15 0302 09/13/15 0442 09/17/15 0445  WBC 6.1 6.2 5.9 5.5  HGB 6.8* 8.9* 8.8* 9.3*  HCT 27.5* 33.5* 34.0* 34.6*  MCV 72.8* 76.7* 77.8* 79.0  PLT 148* 134* 134* 263   Basic Metabolic Panel:  Recent Labs Lab 09/13/15 0442 09/14/15 0402 09/15/15 0557 09/16/15 0535 09/17/15 0445  NA 137 135 137 135 134*  K 4.5 4.4 3.9 3.8 3.8  CL 84* 86* 85* 82* 81*  CO2 41* 41* 42* 42* 40*  GLUCOSE 202* 165* 188* 199* 201*  BUN 29* 26* 27* 27* 26*  CREATININE 1.12* 1.08*  1.04* 0.94 0.99  CALCIUM 7.6* 8.3* 8.5* 8.8* 9.1   GFR: Estimated Creatinine Clearance: 58.7 mL/min (by C-G formula based on Cr of 0.99). Liver Function Tests:  Recent Labs Lab 09/14/15 0402  AST 23  ALT 17  ALKPHOS 53  BILITOT 1.2  PROT 6.7  ALBUMIN 3.3*   CBG:  Recent Labs Lab 09/16/15 1220 09/16/15 1642 09/16/15 2145 09/17/15 0746 09/17/15 1148  GLUCAP 184* 194* 213* 163* 172*   Urine analysis:    Component Value Date/Time   COLORURINE YELLOW 09/13/2015 2230   APPEARANCEUR CLEAR 09/13/2015 2230   LABSPEC 1.031* 09/13/2015 2230   PHURINE 6.0 09/13/2015 2230   GLUCOSEU NEGATIVE 09/13/2015 2230   HGBUR NEGATIVE 09/13/2015 2230   HGBUR negative 08/04/2008 1526   BILIRUBINUR NEGATIVE 09/13/2015 2230   BILIRUBINUR n 11/24/2012 1335   KETONESUR NEGATIVE 09/13/2015 2230   PROTEINUR NEGATIVE 09/13/2015 2230   PROTEINUR trace 11/24/2012 1335   UROBILINOGEN 0.2 11/18/2014 0750   UROBILINOGEN 0.2 11/24/2012 1335   NITRITE NEGATIVE 09/13/2015 2230   NITRITE n 11/24/2012 1335   LEUKOCYTESUR NEGATIVE 09/13/2015 2230  Radiology Studies: No results found.      Scheduled Meds: . sodium chloride   Intravenous Once  . aspirin  81 mg Oral Daily  . atorvastatin  20 mg Oral Daily  . darifenacin  7.5 mg Oral Daily  . DULoxetine  60 mg Oral Daily  . furosemide  80 mg Intravenous BID  . gabapentin  300 mg Oral TID  . insulin aspart  0-5 Units Subcutaneous QHS  . insulin aspart  0-9 Units Subcutaneous TID WC  . insulin aspart  3 Units Subcutaneous TID WC  . insulin glargine  5 Units Subcutaneous QHS  . mometasone-formoterol  2 puff Inhalation BID  . pantoprazole  40 mg Oral Daily  . polyethylene glycol  17 g Oral Daily  . sodium chloride flush  3 mL Intravenous Q12H  . spironolactone  12.5 mg Oral Daily  . traZODone  50 mg Oral QHS   Continuous Infusions:     LOS: 10 days    Oswald Hillock, MD [facility] Pager [phone]  If 7PM-7AM, please  contact night-coverage [email or web address] Password [facility] 09/17/2015, 12:29 PM

## 2015-09-18 LAB — CBC
HEMATOCRIT: 33.9 % — AB (ref 36.0–46.0)
HEMOGLOBIN: 8.8 g/dL — AB (ref 12.0–15.0)
MCH: 20.2 pg — ABNORMAL LOW (ref 26.0–34.0)
MCHC: 26 g/dL — ABNORMAL LOW (ref 30.0–36.0)
MCV: 77.8 fL — AB (ref 78.0–100.0)
Platelets: 169 10*3/uL (ref 150–400)
RBC: 4.36 MIL/uL (ref 3.87–5.11)
RDW: 21 % — AB (ref 11.5–15.5)
WBC: 5.7 10*3/uL (ref 4.0–10.5)

## 2015-09-18 LAB — BASIC METABOLIC PANEL
ANION GAP: 11 (ref 5–15)
BUN: 28 mg/dL — AB (ref 6–20)
CHLORIDE: 76 mmol/L — AB (ref 101–111)
CO2: 43 mmol/L — ABNORMAL HIGH (ref 22–32)
Calcium: 9 mg/dL (ref 8.9–10.3)
Creatinine, Ser: 1.12 mg/dL — ABNORMAL HIGH (ref 0.44–1.00)
GFR, EST AFRICAN AMERICAN: 56 mL/min — AB (ref >60)
GFR, EST NON AFRICAN AMERICAN: 48 mL/min — AB (ref >60)
Glucose, Bld: 212 mg/dL — ABNORMAL HIGH (ref 65–99)
POTASSIUM: 3.8 mmol/L (ref 3.5–5.1)
SODIUM: 130 mmol/L — AB (ref 135–145)

## 2015-09-18 LAB — GLUCOSE, CAPILLARY
GLUCOSE-CAPILLARY: 174 mg/dL — AB (ref 65–99)
Glucose-Capillary: 182 mg/dL — ABNORMAL HIGH (ref 65–99)
Glucose-Capillary: 186 mg/dL — ABNORMAL HIGH (ref 65–99)
Glucose-Capillary: 199 mg/dL — ABNORMAL HIGH (ref 65–99)

## 2015-09-18 LAB — MAGNESIUM: Magnesium: 1.2 mg/dL — ABNORMAL LOW (ref 1.7–2.4)

## 2015-09-18 MED ORDER — METOLAZONE 5 MG PO TABS
5.0000 mg | ORAL_TABLET | Freq: Once | ORAL | Status: AC
  Administered 2015-09-18: 5 mg via ORAL
  Filled 2015-09-18: qty 1

## 2015-09-18 MED ORDER — FENTANYL 25 MCG/HR TD PT72
25.0000 ug | MEDICATED_PATCH | TRANSDERMAL | Status: DC
  Administered 2015-09-18 – 2015-09-21 (×2): 25 ug via TRANSDERMAL
  Filled 2015-09-18 (×2): qty 1

## 2015-09-18 MED ORDER — POTASSIUM CHLORIDE CRYS ER 20 MEQ PO TBCR
40.0000 meq | EXTENDED_RELEASE_TABLET | Freq: Once | ORAL | Status: AC
  Administered 2015-09-18: 40 meq via ORAL
  Filled 2015-09-18: qty 2

## 2015-09-18 NOTE — Progress Notes (Signed)
PROGRESS NOTE    Jillian Bright  FTD:[id number] DOB: November 03, 1945 DOA: 09/07/2015 PCP: [name], MD  Outpatient Specialists:     Brief Narrative: 69 year old female with history of diabetes mellitus, COPD, sleep apnea, pulmonary hypertension, obesity, history of coronary artery disease, congestive heart failure, who presents to the ER with worsening bilateral lower extremity swelling and shortness of breath with orthopnea.Patient is known history of COPD Oxygen ,3 liters at baseline as well as history of diastolic heart failure last echogram was in 2015 showing EF of 06%-23 diastolic,chronic1 diastolic heart failure and moderate aortic stenosis. Patient found to have left upper lobe pulmonary nodule which has enlarged since prior CT from 2015.patient was started on dopamine by cardiology, which has now been discontinued.   Assessment & Plan:   Principal Problem:   Acute on chronic diastolic (congestive) heart failure (HCC) Active Problems:   Essential hypertension   Pulmonary HTN (HCC)   Chronic diastolic heart failure (HCC)   COPD with emphysema (HCC)   Obesity hypoventilation syndrome (HCC)   CAD (coronary artery disease)   Aortic stenosis   DM type 2 (diabetes mellitus, type 2) (HCC)   On home oxygen therapy   Microcytic anemia   CHF (congestive heart failure) (HCC)   CHF exacerbation (HCC)   Abnormal chest x-ray   Acute on chronic congestive heart failure (HCC)   Morbid obesity (HCC)   Pleural effusion on right   Tricuspid regurgitation   Right-sided congestive heart failure (HCC)   Mitral regurgitation   Acute hypoxemic respiratory failure (HCC)   OSA (obstructive sleep apnea)   Pleural effusion   Chronic obstructive pulmonary disease (HCC)   Lung nodule   Acute on chronic diastolic CHF (congestive heart failure), NYHA class 1 ([place]) - also likely secondary to noncompliance - patient admitted to not taking lasix prior to admission because she did not feel she  needed it Continue telemetry Serial trop remained had stable at 0.04. Suspect troponin leak in the setting of acute CHF Repeat 2-D echo with EF 60-65% with mod-severe AS  Cont to hold off on ACE/ARBi due to renal insufficiency  Wt today 89.6> 91.17kg<-90.8kg   Net neg 15.5 L since admit Currently patient on IV Lasix for aggressive diuresis per cardiology.  Pleural effusion Pulmonary attempted thoracentesis yesterday, unable to perform due to lung tissue on the way. Will attempt again on Monday.  Lung nodule CT angiogram showed left upper lobe pulmonary nodule which has enlarged since the prior CT from 2015. PET/CT recommended for further evaluation Pulmonary was consulted for further evaluation.At this time pulmonology recommends outpatient PET/CT.  Severe AS  Valve area <1cm  Consulted CT Surgery Will need LHC and RHC if she goes to surgery  . CAD (coronary artery disease) chronic continue home medications  . Essential hypertension chronic continue home medications   . Microcytic anemia Plan to cont to transfuse for hemoglobin below 7 or if significantly symptomatic. Given IV iron. Pt is iron deficient . Patient will need ferrous sulfate on discharge and possible GI evaluation as outpatient - Hgb down to 6.9 on 4/16, appropriately responded to 2 units of PRBC's -Hgb remains stable  . COPD with emphysema ([place]) stable continue home oxygen and inhalers  Diabetes mellitus hold by mouth medications order sliding scale, cont low-dose Lantus  Leg edema suspect likely secondary to heart failure. LE dopplers neg for DVT  Constipation: Reports no BM for one week. Pt now having BM with cathartics.  DVT prophylaxis: SCD's Code Status:DNR  Family Communication: No family present at bedside Disposition Plan: Pending workup for lung nodule and treatment for severe aortic stenosis   Consultants:   CT surgery  Cardiology  Procedures:      Antimicrobials:    Subjective: Patient seen and examined this morning. Slept in chair last night.   denies chest pain or shortness of breath.   Objective: Filed Vitals:   09/18/15 0600 09/18/15 0806 09/18/15 0844 09/18/15 0846  BP:  115/93    Pulse: 92 95    Temp:  98 F (36.7 C)    TempSrc:  Oral    Resp: 12 17    Height:      Weight:   89.8 kg (197 lb 15.6 oz)   SpO2: 99% 93%  92%    Intake/Output Summary (Last 24 hours) at 09/18/15 0959 Last data filed at 09/18/15 0837  Gross per 24 hour  Intake   1050 ml  Output   3350 ml  Net  -2300 ml   Filed Weights   09/16/15 0628 09/17/15 0420 09/18/15 0844  Weight: 90.583 kg (199 lb 11.2 oz) 89.6 kg (197 lb 8.5 oz) 89.8 kg (197 lb 15.6 oz)    Examination:  General exam: Appears calm and comfortable, sitting in chair  Respiratory system: Bibasilar crackles.Respiratory effort normal. Cardiovascular system: S1 & S2 heard, RRR. No JVD, murmurs, rubs, gallops or clicks.  Gastrointestinal system: Abdomen is nontender. No organomegaly or masses felt. Posl bowel sounds Central nervous system: Alert and oriented. No focal neurological deficits. Extremities: Symmetric 5 x 5 power. B LE pitting edema Skin: No rashes, lesions or ulcers Psychiatry: Judgement and insight appear normal. Mood & affect appropriate.     Data Reviewed: I have personally reviewed following labs and imaging studies  CBC:  Recent Labs Lab 09/12/15 0302 09/13/15 0442 09/17/15 0445 09/18/15 0445  WBC 6.2 5.9 5.5 5.7  HGB 8.9* 8.8* 9.3* 8.8*  HCT 33.5* 34.0* 34.6* 33.9*  MCV 76.7* 77.8* 79.0 77.8*  PLT 134* 134* 160 510   Basic Metabolic Panel:  Recent Labs Lab 09/14/15 0402 09/15/15 0557 09/16/15 0535 09/17/15 0445 09/18/15 0445  NA 135 137 135 134* 130*  K 4.4 3.9 3.8 3.8 3.8  CL 86* 85* 82* 81* 76*  CO2 41* 42* 42* 40* 43*  GLUCOSE 165* 188* 199* 201* 212*  BUN 26* 27* 27* 26* 28*  CREATININE 1.08* 1.04* 0.94 0.99 1.12*   CALCIUM 8.3* 8.5* 8.8* 9.1 9.0  MG  --   --   --   --  1.2*   GFR: Estimated Creatinine Clearance: 52 mL/min (by C-G formula based on Cr of 1.12). Liver Function Tests:  Recent Labs Lab 09/14/15 0402  AST 23  ALT 17  ALKPHOS 53  BILITOT 1.2  PROT 6.7  ALBUMIN 3.3*   CBG:  Recent Labs Lab 09/17/15 0746 09/17/15 1148 09/17/15 1643 09/17/15 2123 09/18/15 0807  GLUCAP 163* 172* 272* 277* 182*   Urine analysis:    Component Value Date/Time   COLORURINE YELLOW 09/13/2015 2230   APPEARANCEUR CLEAR 09/13/2015 2230   LABSPEC 1.031* 09/13/2015 2230   PHURINE 6.0 09/13/2015 2230   GLUCOSEU NEGATIVE 09/13/2015 2230   HGBUR NEGATIVE 09/13/2015 2230   HGBUR negative 08/04/2008 1526   BILIRUBINUR NEGATIVE 09/13/2015 2230   BILIRUBINUR n 11/24/2012 1335   KETONESUR NEGATIVE 09/13/2015 2230   PROTEINUR NEGATIVE 09/13/2015 2230   PROTEINUR trace 11/24/2012 1335   UROBILINOGEN 0.2 11/18/2014 0750   UROBILINOGEN 0.2 11/24/2012 1335  NITRITE NEGATIVE 09/13/2015 2230   NITRITE n 11/24/2012 [place] 09/13/2015 2230    Radiology Studies: No results found.      Scheduled Meds: . sodium chloride   Intravenous Once  . aspirin  81 mg Oral Daily  . atorvastatin  20 mg Oral Daily  . darifenacin  7.5 mg Oral Daily  . DULoxetine  60 mg Oral Daily  . furosemide  80 mg Intravenous BID  . gabapentin  300 mg Oral TID  . insulin aspart  0-5 Units Subcutaneous QHS  . insulin aspart  0-9 Units Subcutaneous TID WC  . insulin aspart  3 Units Subcutaneous TID WC  . insulin glargine  5 Units Subcutaneous QHS  . mometasone-formoterol  2 puff Inhalation BID  . pantoprazole  40 mg Oral Daily  . polyethylene glycol  17 g Oral Daily  . sodium chloride flush  3 mL Intravenous Q12H  . spironolactone  12.5 mg Oral Daily  . traZODone  50 mg Oral QHS   Continuous Infusions:     LOS: 11 days    Oswald Hillock, MD [facility] Pager [phone]  If 7PM-7AM,  please contact night-coverage [email or web address] Password [facility] 09/18/2015, 9:59 AM

## 2015-09-18 NOTE — Progress Notes (Signed)
Spoke with [employer] PA. Pt states she does not remember Dr. Aundra Dubin visiting this morning to discuss TEE and Cath. Rosita Fire PA states she will come by to talk to pt before getting consent.

## 2015-09-18 NOTE — Progress Notes (Signed)
Patient ID: Jillian Bright, female   DOB: Jun 28, 1945, 70 y.o.   MRN: [id number]    Advanced Heart Failure Rounding Note   Subjective:   70 year old female history of coronary artery disease, congestive heart failure, diabetes mellitus, oxygen dependent COPD, obstructive sleep apnea-intolerant to CPAP, pulmonary hypertension, obesity, hyperlipidemia. She was last seen by Dr. Aundra Dubin in August 2013. She initially presented with peripheral edema in 2009 and had a normal stress test and preserved EF on echocardiogram. She had a right heart catheterization which showed mild/moderate pulmonary hypertension which responded well to oxygen with decreased PA pressure. She had 2-D echocardiogram on 09/08/2015 revealing normal ejection fraction of 60-65% and wall motion. Grade 1 diastolic dysfunction. Moderate to severe aortic stenosis where previously was moderate. Mild MR. Left atrium mildly dilated. Peak PA pressure 61 mmHg. Right atrium moderately to severely dilated. Moderate tricuspid valve regurgitation.  She diuresed well yesterday with Lasix/metolazone, weight down again.  Breathing getting better.   Bedside thoracentesis not done due to higher risk from position of lung tissue.   09/13/15 CTA: negative for PE, enlarging lung nodule noted.  Objective:   Weight Range:  Vital Signs:   Temp:  [98.1 F (36.7 C)-98.9 F (37.2 C)] 98.1 F (36.7 C) (04/23 0424) Pulse Rate:  [92-112] 92 (04/23 0600) Resp:  [10-23] 12 (04/23 0600) BP: (89-123)/(56-77) 93/64 mmHg (04/23 0424) SpO2:  [82 %-99 %] 99 % (04/23 0600) Last BM Date: 09/14/15  Weight change: Filed Weights   09/15/15 0500 09/16/15 0628 09/17/15 0420  Weight: 207 lb 7.3 oz (94.1 kg) 199 lb 11.2 oz (90.583 kg) 197 lb 8.5 oz (89.6 kg)    Intake/Output:   Intake/Output Summary (Last 24 hours) at 09/18/15 0802 Last data filed at 09/18/15 0450  Gross per 24 hour  Intake   1650 ml  Output   3800 ml  Net  -2150 ml     Physical  Exam: General: NAD. No resp difficulty. In bed.  HEENT: normal Neck: supple. JVP 14 cm. Carotids 2+ bilat; no bruits. No lymphadenopathy or thryomegaly appreciated. Cor: PMI nondisplaced. Regular rate & rhythm. No rubs, gallops.  3/6 crescendo-decrescendo murmur RUSB with muffled S2. Lungs: RML RLL decreased. On 3 liters oxygen Abdomen: soft, nontender, nondistended. No hepatosplenomegaly. No bruits or masses. Good bowel sounds. Extremities: no cyanosis, clubbing, rash.  R and LLE 2+ edema to knees, wearing unna boots. Neuro: alert & orientedx3, cranial nerves grossly intact. moves all 4 extremities w/o difficulty. Affect pleasant  Telemetry: ST 100s   Labs: Basic Metabolic Panel:  Recent Labs Lab 09/14/15 0402 09/15/15 0557 09/16/15 0535 09/17/15 0445 09/18/15 0445  NA 135 137 135 134* 130*  K 4.4 3.9 3.8 3.8 3.8  CL 86* 85* 82* 81* 76*  CO2 41* 42* 42* 40* 43*  GLUCOSE 165* 188* 199* 201* 212*  BUN 26* 27* 27* 26* 28*  CREATININE 1.08* 1.04* 0.94 0.99 1.12*  CALCIUM 8.3* 8.5* 8.8* 9.1 9.0  MG  --   --   --   --  1.2*    Liver Function Tests:  Recent Labs Lab 09/14/15 0402  AST 23  ALT 17  ALKPHOS 53  BILITOT 1.2  PROT 6.7  ALBUMIN 3.3*   No results for input(s): LIPASE, AMYLASE in the last 168 hours. No results for input(s): AMMONIA in the last 168 hours.  CBC:  Recent Labs Lab 09/12/15 0302 09/13/15 0442 09/17/15 0445 09/18/15 0445  WBC 6.2 5.9 5.5 5.7  HGB 8.9* 8.8* 9.3*  8.8*  HCT 33.5* 34.0* 34.6* 33.9*  MCV 76.7* 77.8* 79.0 77.8*  PLT 134* 134* 160 169    Cardiac Enzymes: No results for input(s): CKTOTAL, CKMB, CKMBINDEX, TROPONINI in the last 168 hours.  BNP: BNP (last 3 results)  Recent Labs  11/18/14 1015 09/07/15 2000  BNP 176.5* 848.6*    ProBNP (last 3 results) No results for input(s): PROBNP in the last 8760 hours.    Other results:  Imaging: No results found.   Medications:     Scheduled Medications: . sodium  chloride   Intravenous Once  . aspirin  81 mg Oral Daily  . atorvastatin  20 mg Oral Daily  . darifenacin  7.5 mg Oral Daily  . DULoxetine  60 mg Oral Daily  . furosemide  80 mg Intravenous BID  . gabapentin  300 mg Oral TID  . insulin aspart  0-5 Units Subcutaneous QHS  . insulin aspart  0-9 Units Subcutaneous TID WC  . insulin aspart  3 Units Subcutaneous TID WC  . insulin glargine  5 Units Subcutaneous QHS  . metolazone  5 mg Oral Once  . mometasone-formoterol  2 puff Inhalation BID  . pantoprazole  40 mg Oral Daily  . polyethylene glycol  17 g Oral Daily  . potassium chloride  40 mEq Oral Once  . sodium chloride flush  3 mL Intravenous Q12H  . spironolactone  12.5 mg Oral Daily  . traZODone  50 mg Oral QHS    Infusions:    PRN Medications: sodium chloride, ALPRAZolam, ipratropium-albuterol, ondansetron (ZOFRAN) IV, oxyCODONE-acetaminophen, sodium chloride flush, sodium chloride flush   Assessment/Plan/Discussion     1. 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 Echo 4/17 with EF 60-65%, dilated and dysfunctional RV, PASP 61 mmHg, moderate to severe AS with mean gradient 34 and AVA 0.9 cm^2, moderate to severe TR. On exam, she is still volume overloaded though weight coming down with diuresis.  - Continue Lasix 80 mg IV bid, will add metolazone 5 mg po x 1 again today.  - RHC tomorrow.  - Significant right pleural effusion.  Given position of lung tissue on [place], thoracentesis was not done.  - Unna boots in place.  2. Aortic stenosis: Possibly severe. Clearly progressed from prior echo (several years ago). She will need TEE to assess more closely. Will hold off on this until she is fully diuresed as will also do RHC/LHC (plan Monday). Treatment of the aortic valve will be tricky. Anesthesia will be quite risky given her pulmonary disease (COPD and OHS/OSA). I think that she will do better with TAVR than with open AVR. There  is concern that TAVR will not address the TR, however this may improve with diuresis and decreased RVEDP. Per pulmonary, high risk for anesthesia. 3. Pulmonary hypertension: She has a history of pulmonary hypertension thought to be due to OHS/OSA. Suspect her [place] currently is a combination of OHS/OSA and pulmonary venous hypertension from volume overload.  - Will diurese, then RHC.  4. OHS/OSA: Cannot tolerate CPAP at night. She is on home O2 chronically.  5. COPD: On home O2, no longer smokes.  6. CAD: h/o RCA PCI. Will need LHC with RHC prior to any consideration of valve intervention. Will do this after she is more fully diuresed (Monday). Continue ASA 81 and statin.  7. Tricuspid regurgitation: Moderate to severe. Will reassess by TEE after more diuresis. This may improve with diuresis.  8. Pulmonary nodule:  Enlarging pulmonary nodule concerning for bronchogenic carcinoma, especially with smoking history. This will have to be addressed after her heart issues are resolved. Will need PET scan after cardiac issues stabilized.   Loralie Champagne 09/18/2015 8:02 AM

## 2015-09-18 NOTE — Progress Notes (Signed)
TCTS BRIEF PROGRESS NOTE   Plans noted.  Will continue to follow intermittently.  Rexene Alberts, MD 09/18/2015 10:05 AM

## 2015-09-18 NOTE — Progress Notes (Signed)
Name: Jillian Bright MRN: [id number] DOB: Feb 29, 1946    ADMISSION DATE:  09/07/2015 CONSULTATION DATE:  4/19  REFERRING MD :  [facility]   CHIEF COMPLAINT:  Lung nodule   BRIEF PATIENT DESCRIPTION: 70yo morbidly obese female former smoker (quit 2009) with multiple medical problems including COPD, OSA (intolerant to CPAP), Pulmonary HTN (PA peak pressure 61 mmHg), dCHF, Severe AS, CAD, chronic respiratory failure on home O2 initially presented 4/12 with 3 week hx BLE edema, SOB, orthopnea.  She was admitted, diuresed and seen in consultation by CVTS for possible aortic valve repair/replacement.  CTA chest to r/o PE revealed enlarging LUL pulmonary nodule and PCCM consulted.   SIGNIFICANT EVENTS  4/12 admitted for SOB 4/19 PCCM consulted for enlarging LUL nodule.   STUDIES:  CTA chest 4/19>>>Negative for pulmonary embolus.  Left upper lobe pulmonary nodule has enlarged since the prior CT scan and could be a bronchogenic carcinoma. PET CT scan is recommended for further evaluation.  Moderate right pleural effusion with associated compressive atelectasis. 2D echo 4/13>>> 76-16%, grade 1 diastolic dysfunction, severe AS, mild MR, severely dilated RA, PA peak pressure 65mHg  HISTORY OF PRESENT ILLNESS:  69yo morbidly obese female former smoker (quit 2009) with multiple medical problems including COPD, OSA (intolerant to CPAP), Pulmonary HTN (PA peak pressure 61 mmHg), dCHF, Severe AS, CAD, chronic respiratory failure on home O2 initially presented 4/12 with 3 week hx BLE edema, SOB, orthopnea.  She was admitted, diuresed and seen in consultation by CVTS for possible aortic valve repair/replacement.  CTA chest to r/o PE revealed enlarging LUL pulmonary nodule and PCCM consulted.    SUBJECTIVE:  diurescing well. Less sob. Feels better.     VITAL SIGNS: Temp:  [97.8 F (36.6 C)-98.9 F (37.2 C)] 97.8 F (36.6 C) (04/23 1205) Pulse Rate:  [92-112] 107 (04/23 1113) Resp:  [10-23] 15 (04/23  1113) BP: (89-115)/(58-93) 94/69 mmHg (04/23 1113) SpO2:  [82 %-99 %] 91 % (04/23 1113) Weight:  [197 lb 15.6 oz (89.8 kg)] 197 lb 15.6 oz (89.8 kg) (04/23 0844)  PHYSICAL EXAMINATION: General:  Chronically ill appearing female, NAD  Neuro:  Awake, alert, appropriate, MAE  HEENT:  Mm moist, no JVD  Cardiovascular:  s1s2 rrr Lungs:  resps even non labored at rest on 4L , bibasilar crackles Abdomen:  Round, soft, +bs  Musculoskeletal:  Warm and dry, Gr 2 BLE edema, erythema     Recent Labs Lab 09/16/15 0535 09/17/15 0445 09/18/15 0445  NA 135 134* 130*  K 3.8 3.8 3.8  CL 82* 81* 76*  CO2 42* 40* 43*  BUN 27* 26* 28*  CREATININE 0.94 0.99 1.12*  GLUCOSE 199* 201* 212*    Recent Labs Lab 09/13/15 0442 09/17/15 0445 09/18/15 0445  HGB 8.8* 9.3* 8.8*  HCT 34.0* 34.6* 33.9*  WBC 5.9 5.5 5.7  PLT 134* 160 169   No results found.  ASSESSMENT / PLAN:  LUL pulmonary nodule (11.491mx 8.22m10m- slightly increased from previous CT scan in 2015 COPD. Severe with an FEV1 of 1.12 or 46% OSA - intolerant to CPAP -  OHS Secondary PAH R effusion with associated RLL collapse Pt with mod-severe AS, RV failure, acute on chronic diastolic HF exacerbation, CAD Acute on Chronic Hypoxemic Hypercapneic Resp Fx 2/2 above  REC -  Diurescing well.  Technically difficult thoracentesis so it was not done.(with lung tissue on the way) Plan for CXR on Monday and evaluate for effusion > if still significant > try [place]  guided thora again.   Continue diuresis per cards. Agree with aggressive diuresis Continue dulera, PRN SABA  Pulmonary hygiene  She would benefit from CPAP but is unable to tolerate  High risk for surgical procedure > d/w pt. Pt at standby for AV surgery.  Outpt f/u for lung nodule (likely CA)   J. Shirl Harris, MD 09/18/2015, 1:22 PM [facility] Pager [phone] After 3 pm or if no answer, call [phone]

## 2015-09-19 ENCOUNTER — Encounter (HOSPITAL_COMMUNITY)
Admission: EM | Disposition: A | Discharge status: Skilled Nursing Facility | Payer: Self-pay | Source: Home / Self Care | Attending: Internal Medicine

## 2015-09-19 ENCOUNTER — Inpatient Hospital Stay (HOSPITAL_COMMUNITY): Payer: Medicare Other | Admitting: Certified Registered Nurse Anesthetist

## 2015-09-19 ENCOUNTER — Inpatient Hospital Stay (HOSPITAL_COMMUNITY): Payer: Medicare Other

## 2015-09-19 ENCOUNTER — Encounter (HOSPITAL_COMMUNITY): Payer: Self-pay

## 2015-09-19 LAB — BASIC METABOLIC PANEL
Anion gap: 19 — ABNORMAL HIGH (ref 5–15)
BUN: 36 mg/dL — AB (ref 6–20)
CHLORIDE: 73 mmol/L — AB (ref 101–111)
CO2: 39 mmol/L — ABNORMAL HIGH (ref 22–32)
CREATININE: 1.83 mg/dL — AB (ref 0.44–1.00)
Calcium: 9.2 mg/dL (ref 8.9–10.3)
GFR calc Af Amer: 31 mL/min — ABNORMAL LOW (ref >60)
GFR calc non Af Amer: 27 mL/min — ABNORMAL LOW (ref >60)
GLUCOSE: 254 mg/dL — AB (ref 65–99)
POTASSIUM: 4.2 mmol/L (ref 3.5–5.1)
SODIUM: 131 mmol/L — AB (ref 135–145)

## 2015-09-19 LAB — POCT I-STAT 3, ART BLOOD GAS (G3+)
Acid-Base Excess: 21 mmol/L — ABNORMAL HIGH (ref 0.0–2.0)
Bicarbonate: 51.5 mEq/L — ABNORMAL HIGH (ref 20.0–24.0)
O2 Saturation: 91 %
PCO2 ART: 86.6 mmHg — AB (ref 35.0–45.0)
PH ART: 7.376 (ref 7.350–7.450)
Patient temperature: 96.3
TCO2: >50 mmol/L (ref 0–100)
pO2, Arterial: 63 mmHg — ABNORMAL LOW (ref 80.0–100.0)

## 2015-09-19 LAB — PROTIME-INR
INR: 1.34 (ref 0.00–1.49)
Prothrombin Time: 16.7 seconds — ABNORMAL HIGH (ref 11.6–15.2)

## 2015-09-19 LAB — GLUCOSE, CAPILLARY
GLUCOSE-CAPILLARY: 222 mg/dL — AB (ref 65–99)
GLUCOSE-CAPILLARY: 200 mg/dL — AB (ref 65–99)
GLUCOSE-CAPILLARY: 208 mg/dL — AB (ref 65–99)
Glucose-Capillary: 188 mg/dL — ABNORMAL HIGH (ref 65–99)
Glucose-Capillary: 193 mg/dL — ABNORMAL HIGH (ref 65–99)

## 2015-09-19 LAB — CBC
HEMATOCRIT: 34 % — AB (ref 36.0–46.0)
Hemoglobin: 8.7 g/dL — ABNORMAL LOW (ref 12.0–15.0)
MCH: 20.1 pg — ABNORMAL LOW (ref 26.0–34.0)
MCHC: 25.6 g/dL — AB (ref 30.0–36.0)
MCV: 78.7 fL (ref 78.0–100.0)
PLATELETS: 197 10*3/uL (ref 150–400)
RBC: 4.32 MIL/uL (ref 3.87–5.11)
RDW: 21 % — AB (ref 11.5–15.5)
WBC: 6.7 10*3/uL (ref 4.0–10.5)

## 2015-09-19 LAB — CARBOXYHEMOGLOBIN
Carboxyhemoglobin: 2.8 % — ABNORMAL HIGH (ref 0.5–1.5)
Methemoglobin: 0.7 % (ref 0.0–1.5)
O2 Saturation: 49.6 %
Total hemoglobin: 9.2 g/dL — ABNORMAL LOW (ref 12.0–16.0)

## 2015-09-19 SURGERY — RIGHT/LEFT HEART CATH AND CORONARY ANGIOGRAPHY
Anesthesia: LOCAL

## 2015-09-19 SURGERY — CANCELLED PROCEDURE

## 2015-09-19 MED ORDER — POTASSIUM CHLORIDE CRYS ER 20 MEQ PO TBCR
40.0000 meq | EXTENDED_RELEASE_TABLET | Freq: Once | ORAL | Status: AC
  Administered 2015-09-19: 40 meq via ORAL
  Filled 2015-09-19: qty 2

## 2015-09-19 MED ORDER — DOBUTAMINE IN D5W 4-5 MG/ML-% IV SOLN
5.0000 ug/kg/min | INTRAVENOUS | Status: DC
  Administered 2015-09-19: 2.5 ug/kg/min via INTRAVENOUS
  Administered 2015-09-21: 5 ug/kg/min via INTRAVENOUS
  Filled 2015-09-19 (×2): qty 250

## 2015-09-19 MED ORDER — DEXTROSE 5 % IV SOLN
12.0000 mg/h | INTRAVENOUS | Status: DC
  Administered 2015-09-19 – 2015-09-20 (×2): 12 mg/h via INTRAVENOUS
  Filled 2015-09-19 (×8): qty 25

## 2015-09-19 MED ORDER — ACETAZOLAMIDE 250 MG PO TABS
250.0000 mg | ORAL_TABLET | Freq: Two times a day (BID) | ORAL | Status: DC
  Administered 2015-09-19 – 2015-09-20 (×3): 250 mg via ORAL
  Filled 2015-09-19 (×4): qty 1

## 2015-09-19 MED ORDER — FUROSEMIDE 10 MG/ML IJ SOLN
80.0000 mg | Freq: Once | INTRAMUSCULAR | Status: AC
  Administered 2015-09-19: 80 mg via INTRAVENOUS
  Filled 2015-09-19: qty 8

## 2015-09-19 MED ORDER — SODIUM CHLORIDE 0.9 % IV SOLN: 250.0000 mL | INTRAVENOUS | Status: DC | PRN

## 2015-09-19 MED ORDER — SODIUM CHLORIDE 0.9 % IV BOLUS (SEPSIS): 250.0000 mL | Freq: Once | INTRAVENOUS | Status: DC

## 2015-09-19 MED ORDER — SODIUM CHLORIDE 0.9% FLUSH: 3.0000 mL | INTRAVENOUS | Status: DC | PRN

## 2015-09-19 MED ORDER — SODIUM CHLORIDE 0.9 % IV SOLN
INTRAVENOUS | Status: DC
  Administered 2015-09-19: 05:00:00 via INTRAVENOUS

## 2015-09-19 MED ORDER — SODIUM CHLORIDE 0.9 % IV SOLN: INTRAVENOUS | Status: DC

## 2015-09-19 MED ORDER — SODIUM CHLORIDE 0.9% FLUSH
3.0000 mL | Freq: Two times a day (BID) | INTRAVENOUS | Status: DC
  Administered 2015-09-19: 3 mL via INTRAVENOUS

## 2015-09-19 MED ORDER — ASPIRIN 81 MG PO CHEW
81.0000 mg | CHEWABLE_TABLET | ORAL | Status: AC
  Administered 2015-09-19: 81 mg via ORAL
  Filled 2015-09-19: qty 1

## 2015-09-19 MED ORDER — POTASSIUM CHLORIDE CRYS ER 20 MEQ PO TBCR
20.0000 meq | EXTENDED_RELEASE_TABLET | Freq: Once | ORAL | Status: AC
  Administered 2015-09-19: 20 meq via ORAL
  Filled 2015-09-19: qty 1

## 2015-09-19 MED ORDER — SODIUM CHLORIDE 0.9 % IV BOLUS (SEPSIS)
250.0000 mL | Freq: Once | INTRAVENOUS | Status: AC
  Administered 2015-09-19: 250 mL via INTRAVENOUS

## 2015-09-19 MED ORDER — METOLAZONE 2.5 MG PO TABS
2.5000 mg | ORAL_TABLET | Freq: Once | ORAL | Status: AC
  Administered 2015-09-19: 2.5 mg via ORAL
  Filled 2015-09-19: qty 1

## 2015-09-19 MED ORDER — ASPIRIN 81 MG PO CHEW
81.0000 mg | CHEWABLE_TABLET | Freq: Every day | ORAL | Status: DC
  Administered 2015-09-20 – 2015-09-28 (×9): 81 mg via ORAL
  Filled 2015-09-19 (×9): qty 1

## 2015-09-19 MED ORDER — ACETAZOLAMIDE 250 MG PO TABS: 250.0000 mg | ORAL_TABLET | Freq: Three times a day (TID) | ORAL | Status: DC

## 2015-09-19 NOTE — Care Management Important Message (Signed)
Important Message  Patient Details  Name: Jillian Bright MRN: [id number] Date of Birth: 1946-02-16   Medicare Important Message Given:  Yes    Tanor Glaspy P [place] 09/19/2015, 1:15 PM

## 2015-09-19 NOTE — Progress Notes (Signed)
Foley insertion attempted, failed. Will try again as soon as practical for patient.

## 2015-09-19 NOTE — Progress Notes (Signed)
Aundra Dubin, MD to bedside. MD verbal order to increase Dobutamine gtt, and get stat ABG. Respiratory to bedside.   Paged MD about critical ABG results. MD ordered trial of Bipap, RT notified. Patient placed on bipap. Pt's oral meds were unable to be given for decreased LOC and NPO. Will continue to monitor closely.

## 2015-09-19 NOTE — Progress Notes (Signed)
08:12 Upon rounding and report, Patient on 4 LNC sitting up in recliner. PM RN getting consent signed for procedure. Patinet satting mid 30's on 4 LNC.   0900 Transport for endoscopy called, they are early for patients procedure. Helped patient who is alert and oriented to the bedside commode. Patient then up to brush her teeth at the sink standby assist. Patient walked back to her bed, endo transport taking patient for her TEE.   Called by Endoscopy RN as patient is requiring more oxygen and is now in a simple mask. Procedures cancelled, Patient being returned to room.

## 2015-09-19 NOTE — Progress Notes (Signed)
Patient back to room, pt placed on non re breather. Patient alert and oriented. Patient educated on the need to keep her mask on to help her get the oxygen her body needs. Will monitor closely

## 2015-09-19 NOTE — Progress Notes (Addendum)
PROGRESS NOTE    Jillian Bright  TXM:[id number] DOB: 08-08-1945 DOA: 09/07/2015 PCP: [name], MD  Outpatient Specialists:     Brief Narrative: 70 year old female with history of diabetes mellitus, COPD, sleep apnea, pulmonary hypertension, obesity, history of coronary artery disease, congestive heart failure, who presents to the ER with worsening bilateral lower extremity swelling and shortness of breath with orthopnea.Patient is known history of COPD Oxygen ,3 liters at baseline as well as history of diastolic heart failure last echogram was in 2015 showing EF of 48%-25 diastolic,chronic1 diastolic heart failure and moderate aortic stenosis. Patient found to have left upper lobe pulmonary nodule which has enlarged since prior CT from 2015.patient was started on dopamine by cardiology, which has now been discontinued.   Assessment & Plan:   Principal Problem:   Acute on chronic diastolic (congestive) heart failure (HCC) Active Problems:   Essential hypertension   Pulmonary HTN (HCC)   Chronic diastolic heart failure (HCC)   COPD with emphysema (HCC)   Obesity hypoventilation syndrome (HCC)   CAD (coronary artery disease)   Aortic stenosis   DM type 2 (diabetes mellitus, type 2) (HCC)   On home oxygen therapy   Microcytic anemia   CHF (congestive heart failure) (HCC)   CHF exacerbation (HCC)   Abnormal chest x-ray   Acute on chronic congestive heart failure (HCC)   Morbid obesity (HCC)   Pleural effusion on right   Tricuspid regurgitation   Right-sided congestive heart failure (HCC)   Mitral regurgitation   Acute hypoxemic respiratory failure (HCC)   OSA (obstructive sleep apnea)   Pleural effusion   Chronic obstructive pulmonary disease (HCC)   Lung nodule   Acute on chronic diastolic CHF (congestive heart failure), NYHA class 1 ([place]) - also likely secondary to noncompliance - patient admitted to not taking lasix prior to admission because she did not feel she  needed it Continue telemetry Serial trop remained had stable at 0.04. Suspect troponin leak in the setting of acute CHF Repeat 2-D echo with EF 60-65% with mod-severe AS  Cont to hold off on ACE/ARBi due to renal insufficiency  Wt today 89.6> 91.17kg<-90.8kg   Net neg 15.5 L since admit Currently patient on IV Lasix for aggressive diuresis per cardiology. Plan for cardiac cath this am  Acute kidney injury The patient's creatinine is 1.83, up from yesterday 1.12. Secondary to aggressive diuresis Patient given gentle IV fluids per cardiology for possible cardiac cath today  Pleural effusion Pulmonary attempted thoracentesis yesterday, unable to perform due to lung tissue on the way. Will attempt again on Monday.  Lung nodule CT angiogram showed left upper lobe pulmonary nodule which has enlarged since the prior CT from 2015. PET/CT recommended for further evaluation Pulmonary was consulted for further evaluation.At this time pulmonology recommends outpatient PET/CT.  Severe AS  Valve area <1cm  Consulted CT Surgery Will need LHC and RHC if she goes to surgery  . CAD (coronary artery disease) chronic continue home medications  . Essential hypertension chronic continue home medications   . Microcytic anemia Plan to cont to transfuse for hemoglobin below 7 or if significantly symptomatic. Given IV iron. Pt is iron deficient . Patient will need ferrous sulfate on discharge and possible GI evaluation as outpatient - Hgb down to 6.9 on 4/16, appropriately responded to 2 units of PRBC's -Hgb remains stable  . COPD with emphysema ([place]) stable continue home oxygen and inhalers  Diabetes mellitus hold by mouth medications order sliding scale, cont low-dose  Lantus  Leg edema suspect likely secondary to heart failure. LE dopplers neg for DVT  Constipation: Reports no BM for one week. Pt now having BM with cathartics.  DVT prophylaxis: SCD's Code Status:DNR Family Communication: No  family present at bedside Disposition Plan: Pending treatment for severe aortic stenosis   Consultants:   CT surgery  Cardiology  Procedures:     Antimicrobials:    Subjective: Patient seen and examined this morning. Denies chest pain or shortness of breath.  Objective: Filed Vitals:   09/19/15 0500 09/19/15 0600 09/19/15 0819 09/19/15 0822  BP:   80/59 80/59  Pulse:   65 65  Temp:   97.3 F (36.3 C)   TempSrc:   Oral   Resp: '11 10 14 14  '$ Height:      Weight: 89.721 kg (197 lb 12.8 oz)     SpO2:   90% 90%    Intake/Output Summary (Last 24 hours) at 09/19/15 1109 Last data filed at 09/19/15 0515  Gross per 24 hour  Intake    250 ml  Output   1150 ml  Net   -900 ml   Filed Weights   09/17/15 0420 09/18/15 0844 09/19/15 0500  Weight: 89.6 kg (197 lb 8.5 oz) 89.8 kg (197 lb 15.6 oz) 89.721 kg (197 lb 12.8 oz)    Examination:  General exam: Appears calm and comfortable, sitting in chair  Respiratory system: Bibasilar crackles.Respiratory effort normal. Cardiovascular system: S1 & S2 heard, RRR. No JVD, murmurs, rubs, gallops or clicks.  Gastrointestinal system: Abdomen is nontender. No organomegaly or masses felt. Posl bowel sounds Central nervous system: Alert and oriented. No focal neurological deficits. Extremities: Symmetric 5 x 5 power. B LE pitting edema Skin: No rashes, lesions or ulcers Psychiatry: Judgement and insight appear normal. Mood & affect appropriate.     Data Reviewed: I have personally reviewed following labs and imaging studies  CBC:  Recent Labs Lab 09/13/15 0442 09/17/15 0445 09/18/15 0445 09/19/15 0432  WBC 5.9 5.5 5.7 6.7  HGB 8.8* 9.3* 8.8* 8.7*  HCT 34.0* 34.6* 33.9* 34.0*  MCV 77.8* 79.0 77.8* 78.7  PLT 134* 160 169 761   Basic Metabolic Panel:  Recent Labs Lab 09/15/15 0557 09/16/15 0535 09/17/15 0445 09/18/15 0445 09/19/15 0432  NA 137 135 134* 130* 131*  K 3.9 3.8 3.8 3.8 4.2  CL 85* 82* 81* 76* 73*  CO2  42* 42* 40* 43* 39*  GLUCOSE 188* 199* 201* 212* 254*  BUN 27* 27* 26* 28* 36*  CREATININE 1.04* 0.94 0.99 1.12* 1.83*  CALCIUM 8.5* 8.8* 9.1 9.0 9.2  MG  --   --   --  1.2*  --    GFR: Estimated Creatinine Clearance: 31.8 mL/min (by C-G formula based on Cr of 1.83). Liver Function Tests:  Recent Labs Lab 09/14/15 0402  AST 23  ALT 17  ALKPHOS 53  BILITOT 1.2  PROT 6.7  ALBUMIN 3.3*   CBG:  Recent Labs Lab 09/18/15 1154 09/18/15 1637 09/18/15 2101 09/19/15 0533 09/19/15 0817  GLUCAP 186* 199* 174* 222* 200*   Urine analysis:    Component Value Date/Time   COLORURINE YELLOW 09/13/2015 2230   APPEARANCEUR CLEAR 09/13/2015 2230   LABSPEC 1.031* 09/13/2015 2230   PHURINE 6.0 09/13/2015 2230   GLUCOSEU NEGATIVE 09/13/2015 2230   HGBUR NEGATIVE 09/13/2015 2230   HGBUR negative 08/04/2008 [place] 09/13/2015 2230   BILIRUBINUR n 11/24/2012 [place] 09/13/2015 2230  PROTEINUR NEGATIVE 09/13/2015 2230   PROTEINUR trace 11/24/2012 1335   UROBILINOGEN 0.2 11/18/2014 0750   UROBILINOGEN 0.2 11/24/2012 1335   NITRITE NEGATIVE 09/13/2015 2230   NITRITE n 11/24/2012 1335   LEUKOCYTESUR NEGATIVE 09/13/2015 2230    Radiology Studies: Dg Chest Port 1 View  09/19/2015  CLINICAL DATA:  Pleural effusion, preop for catheterization EXAM: PORTABLE CHEST 1 VIEW COMPARISON:  09/08/2015 FINDINGS: Moderately enlarged cardiac silhouette. Moderate vascular congestion with mild interstitial prominence. Small right pleural effusion with underlying consolidation similar to prior study. Right PICC line identified with tip just above the cavoatrial junction. No pneumothorax. IMPRESSION: Cardiac enlargement suggesting cardiomegaly as seen on 09/13/2015 CT scan. Vascular congestion with mild interstitial prominence suggests congestive heart failure with mild pulmonary edema. Stable small right effusion with underlying consolidation pair Electronically Signed    By: Skipper Cliche M.D.   On: 09/19/2015 07:15        Scheduled Meds: . sodium chloride   Intravenous Once  . [START ON 09/20/2015] aspirin  81 mg Oral Daily  . atorvastatin  20 mg Oral Daily  . darifenacin  7.5 mg Oral Daily  . DULoxetine  60 mg Oral Daily  . fentaNYL  25 mcg Transdermal Q72H  . furosemide  80 mg Intravenous Once  . gabapentin  300 mg Oral TID  . insulin aspart  0-5 Units Subcutaneous QHS  . insulin aspart  0-9 Units Subcutaneous TID WC  . insulin aspart  3 Units Subcutaneous TID WC  . insulin glargine  5 Units Subcutaneous QHS  . mometasone-formoterol  2 puff Inhalation BID  . pantoprazole  40 mg Oral Daily  . polyethylene glycol  17 g Oral Daily  . potassium chloride  40 mEq Oral Once  . sodium chloride  250 mL Intravenous Once  . sodium chloride flush  3 mL Intravenous Q12H  . traZODone  50 mg Oral QHS   Continuous Infusions: . DOBUTamine 2.5 mcg/kg/min (09/19/15 1026)  . furosemide (LASIX) infusion       LOS: 12 days    Oswald Hillock, MD [facility] Pager [phone]  If 7PM-7AM, please contact night-coverage [email or web address] Password [facility] 09/19/2015, 11:09 AM

## 2015-09-19 NOTE — Progress Notes (Addendum)
Patient ID: Jillian Bright, female   DOB: 1946/01/11, 70 y.o.   MRN: [id number]    Advanced Heart Failure Rounding Note   Subjective:   70 year old female history of coronary artery disease, congestive heart failure, diabetes mellitus, oxygen dependent COPD, obstructive sleep apnea-intolerant to CPAP, pulmonary hypertension, obesity, hyperlipidemia. She was last seen by Dr. Aundra Dubin in August 2013. She initially presented with peripheral edema in 2009 and had a normal stress test and preserved EF on echocardiogram. She had a right heart catheterization which showed mild/moderate pulmonary hypertension which responded well to oxygen with decreased PA pressure. She had 2-D echocardiogram on 09/08/2015 revealing normal ejection fraction of 60-65% and wall motion. Grade 1 diastolic dysfunction. Moderate to severe aortic stenosis where previously was moderate. Mild MR. Left atrium mildly dilated. Peak PA pressure 61 mmHg. Right atrium moderately to severely dilated. Moderate tricuspid valve regurgitation.  She diuresed well yesterday with Lasix/metolazone, weight down again.  Breathing getting better.   Bedside thoracentesis not done due to higher risk from position of lung tissue.   Creatinine higher at 1.8 this morning.   09/13/15 CTA: negative for PE, enlarging lung nodule noted.  Objective:   Weight Range:  Vital Signs:   Temp:  [97.4 F (36.3 C)-98.3 F (36.8 C)] 97.5 F (36.4 C) (04/24 0400) Pulse Rate:  [95-107] 98 (04/24 0400) Resp:  [10-17] 10 (04/24 0600) BP: (80-116)/(44-93) 80/44 mmHg (04/24 0400) SpO2:  [91 %-98 %] 98 % (04/24 0400) Weight:  [197 lb 12.8 oz (89.721 kg)-197 lb 15.6 oz (89.8 kg)] 197 lb 12.8 oz (89.721 kg) (04/24 0500) Last BM Date: 09/14/15  Weight change: Filed Weights   09/17/15 0420 09/18/15 0844 09/19/15 0500  Weight: 197 lb 8.5 oz (89.6 kg) 197 lb 15.6 oz (89.8 kg) 197 lb 12.8 oz (89.721 kg)    Intake/Output:   Intake/Output Summary (Last 24  hours) at 09/19/15 0742 Last data filed at 09/19/15 0515  Gross per 24 hour  Intake    500 ml  Output   1750 ml  Net  -1250 ml     Physical Exam: General: NAD. No resp difficulty. In bed.  HEENT: normal Neck: supple. JVP 14 cm. Carotids 2+ bilat; no bruits. No lymphadenopathy or thryomegaly appreciated. Cor: PMI nondisplaced. Regular rate & rhythm. No rubs, gallops.  3/6 crescendo-decrescendo murmur RUSB with muffled S2. Lungs: RML RLL decreased. On 3 liters oxygen Abdomen: soft, nontender, nondistended. No hepatosplenomegaly. No bruits or masses. Good bowel sounds. Extremities: no cyanosis, clubbing, rash.  R and LLE 2+ edema to knees, wearing unna boots. Neuro: alert & orientedx3, cranial nerves grossly intact. moves all 4 extremities w/o difficulty. Affect pleasant  Telemetry: ST 100s   Labs: Basic Metabolic Panel:  Recent Labs Lab 09/15/15 0557 09/16/15 0535 09/17/15 0445 09/18/15 0445 09/19/15 0432  NA 137 135 134* 130* 131*  K 3.9 3.8 3.8 3.8 4.2  CL 85* 82* 81* 76* 73*  CO2 42* 42* 40* 43* 39*  GLUCOSE 188* 199* 201* 212* 254*  BUN 27* 27* 26* 28* 36*  CREATININE 1.04* 0.94 0.99 1.12* 1.83*  CALCIUM 8.5* 8.8* 9.1 9.0 9.2  MG  --   --   --  1.2*  --     Liver Function Tests:  Recent Labs Lab 09/14/15 0402  AST 23  ALT 17  ALKPHOS 53  BILITOT 1.2  PROT 6.7  ALBUMIN 3.3*   No results for input(s): LIPASE, AMYLASE in the last 168 hours. No results for input(s): AMMONIA  in the last 168 hours.  CBC:  Recent Labs Lab 09/13/15 0442 09/17/15 0445 09/18/15 0445 09/19/15 0432  WBC 5.9 5.5 5.7 6.7  HGB 8.8* 9.3* 8.8* 8.7*  HCT 34.0* 34.6* 33.9* 34.0*  MCV 77.8* 79.0 77.8* 78.7  PLT 134* 160 169 197    Cardiac Enzymes: No results for input(s): CKTOTAL, CKMB, CKMBINDEX, TROPONINI in the last 168 hours.  BNP: BNP (last 3 results)  Recent Labs  11/18/14 1015 09/07/15 2000  BNP 176.5* 848.6*    ProBNP (last 3 results) No results for input(s):  PROBNP in the last 8760 hours.    Other results:  Imaging: Dg Chest Port 1 View  09/19/2015  CLINICAL DATA:  Pleural effusion, preop for catheterization EXAM: PORTABLE CHEST 1 VIEW COMPARISON:  09/08/2015 FINDINGS: Moderately enlarged cardiac silhouette. Moderate vascular congestion with mild interstitial prominence. Small right pleural effusion with underlying consolidation similar to prior study. Right PICC line identified with tip just above the cavoatrial junction. No pneumothorax. IMPRESSION: Cardiac enlargement suggesting cardiomegaly as seen on 09/13/2015 CT scan. Vascular congestion with mild interstitial prominence suggests congestive heart failure with mild pulmonary edema. Stable small right effusion with underlying consolidation pair Electronically Signed   By: Skipper Cliche M.D.   On: 09/19/2015 07:15     Medications:     Scheduled Medications: . sodium chloride   Intravenous Once  . [START ON 09/20/2015] aspirin  81 mg Oral Daily  . atorvastatin  20 mg Oral Daily  . darifenacin  7.5 mg Oral Daily  . DULoxetine  60 mg Oral Daily  . fentaNYL  25 mcg Transdermal Q72H  . gabapentin  300 mg Oral TID  . insulin aspart  0-5 Units Subcutaneous QHS  . insulin aspart  0-9 Units Subcutaneous TID WC  . insulin aspart  3 Units Subcutaneous TID WC  . insulin glargine  5 Units Subcutaneous QHS  . mometasone-formoterol  2 puff Inhalation BID  . pantoprazole  40 mg Oral Daily  . polyethylene glycol  17 g Oral Daily  . potassium chloride  40 mEq Oral Once  . sodium chloride  250 mL Intravenous Once  . sodium chloride  250 mL Intravenous Once  . sodium chloride flush  3 mL Intravenous Q12H  . sodium chloride flush  3 mL Intravenous Q12H  . traZODone  50 mg Oral QHS    Infusions: . sodium chloride    . [START ON 09/20/2015] sodium chloride 10 mL/hr at 09/19/15 0515  . sodium chloride    . sodium chloride      PRN Medications: sodium chloride, sodium chloride, ALPRAZolam,  ipratropium-albuterol, ondansetron (ZOFRAN) IV, oxyCODONE-acetaminophen, sodium chloride flush, sodium chloride flush, sodium chloride flush   Assessment/Plan/Discussion     1. 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 Echo 4/17 with EF 60-65%, dilated and dysfunctional RV, PASP 61 mmHg, moderate to severe AS with mean gradient 34 and AVA 0.9 cm^2, moderate to severe TR. On exam, she is still volume overloaded though weight has come down with diuresis. CVP in 14 range.  - Planned for right and left heart cath today.  Creatinine up to 1.8, BP a bit lower this morning.  Suspect with prominent RV failure, we will not be able to lower CVP to normal range and may have overshot the degree of diuresis she can tolerate with her RV.  I am going to stop her diuretics and give her back some IV fluid this morning and  plan cath after TEE later today.  Will need to use minimal contrast, no LV-gram.  - Significant right pleural effusion.  Given position of lung tissue on [place], thoracentesis was not done.  - Unna boots in place.  2. Aortic stenosis: Possibly severe. Clearly progressed from prior echo (several years ago). She will need TEE to assess more closely. Plan for TEE this morning, as higher risk for sedation will involve anesthesiology. Treatment of the aortic valve will be tricky. Anesthesia for a definitive procedure will be quite risky given her pulmonary disease (COPD and OHS/OSA). I think that she will do better with TAVR than with open AVR. There is concern that TAVR will not address the TR, however this may improve with diuresis and decreased RVEDP. Per pulmonary, high risk for anesthesia. 3. Pulmonary hypertension: She has a history of pulmonary hypertension thought to be due to OHS/OSA. Suspect her [place] currently is a combination of OHS/OSA and pulmonary venous hypertension from volume overload.  - Will diurese, then RHC.  4. OHS/OSA: Cannot tolerate  CPAP at night. She is on home O2 chronically.  5. COPD: On home O2, no longer smokes.  6. CAD: h/o RCA PCI. Will need LHC with RHC prior to any consideration of valve intervention. Plan for today after gentle hydration. Continue ASA 81 and statin.  7. Tricuspid regurgitation: Moderate to severe. Will reassess by TEE after more diuresis. This may improve with diuresis.  8. Pulmonary nodule: Enlarging pulmonary nodule concerning for bronchogenic carcinoma, especially with smoking history. This will have to be addressed after her heart issues are resolved. Will need PET scan after cardiac issues stabilized.  9. AKI: Creatinine to 1.8 today.  With prominent RV failure, likely overshot diuresis/diuresed too fast.  Will give gentle IV fluid back today and hold diuretics.  Plan cath today, will need to minimize contrast, no LV-gram.   Loralie Champagne 09/19/2015 7:42 AM   Patient taken for TEE with anesthesia but significant rise in oxygen requirement, now on NRBM.  CVP up to 18.  Will cancel procedures today.   - SBP in 80s currently.  With rise in creatinine, will add dobutamine at 2.5 mcg/kg/min for RV support.   - Lasix 80 mg IV x 1 and start gtt at 12 mg/hr.  - Will reschedule TEE and cath after better diuresis achieved.   Loralie Champagne 09/19/2015 10:05 AM

## 2015-09-19 NOTE — Progress Notes (Addendum)
Called to room by NT as patient had taken off Non-re breather mask and her sats were reportedly in the 40's. As I enter the room, patient is more lethargic, but O2 sats increasing. Patient states,'I am just tired." Oxygen sats now in the 90's. MD paged, PA to bedside. Will continue to monitor closely.  After speaking to brother from [place] at the bedside, he states the patient,"got choked up on her pot roast." Patient and family educated on calling the nurse as needed. Patient and family also educated again about the need for the patient to keep her non-re breather mask on to keep her oxygen up. Pt and family state that they "underastand". Will monitor closely and re-educated frequently.

## 2015-09-19 NOTE — Progress Notes (Signed)
Reviewed ABG with Dr. Aundra Dubin  Will start on acetazolamide 250 mg BID and will place on trial of BiPAP.  Of note, pt is a DNR.    Legrand Como [address]" [place], PA-C 09/19/2015 4:03 PM

## 2015-09-19 NOTE — Progress Notes (Addendum)
Inpatient Diabetes Program Recommendations  AACE/ADA: New Consensus Statement on Inpatient Glycemic Control (2015)  Target Ranges:  Prepandial:   less than 140 mg/dL      Peak postprandial:   less than 180 mg/dL (1-2 hours)      Critically ill patients:  140 - 180 mg/dL   Review of Glycemic Control Results for Jillian Bright, Jillian Bright (MRN [id number]) as of 09/19/2015 08:20  Ref. Range 09/18/2015 08:07 09/18/2015 11:54 09/18/2015 16:37 09/18/2015 21:01 09/19/2015 05:33  Glucose-Capillary Latest Ref Range: 65-99 mg/dL 182 (H) 186 (H) 199 (H) 174 (H) 222 (H)   Diabetes history: DM Type 2 Outpatient Diabetes medications: Glipizide 10 mg BID, Metformin 500 mg TID, Januvia 100 Daily Current orders for Inpatient glycemic control: Lantus 5 units q hs + Novolog 3 units tid meal coverage + Novolog sensitive correction tid  Inpatient Diabetes Program Recommendations: Please consider increase of Novolog to 4 units TID with meals and increase in Lantus to 10 units daily.   Thank you, Nani Gasser. Yenifer Saccente, RN, MSN, CDE Inpatient Glycemic Control Team Team Pager [phone] (8am-5pm) 09/19/2015 8:20 AM

## 2015-09-19 NOTE — Progress Notes (Signed)
Foley placed, urine returned. Will continue to monitor closely.

## 2015-09-19 NOTE — Progress Notes (Signed)
Paged for pt taking her NRB off and de-satting into 40s.     On my arrival to room has NRB in place and 02 sats > 95%. Slightly lethargic initially but improves during our conversation. Denies SOB or CP. No lightheadedness or dizziness. States legs are still slightly sore.   Husband says she started to get "choked up" on food but spit it out.   SBP in 80s but has been similar all day.    Will continue to monitor for now. No new orders.  Re-insert foley.  Legrand Como [address]" [place], [place] 09/19/2015 2:49 PM

## 2015-09-19 NOTE — Anesthesia Preprocedure Evaluation (Signed)
Anesthesia Evaluation  Patient identified by MRN, date of birth, ID band Patient awake    Reviewed: Allergy & Precautions, NPO status , Patient's Chart, lab work & pertinent test results  Airway Mallampati: II  TM Distance: >3 FB Neck ROM: Full    Dental no notable dental hx.    Pulmonary shortness of breath, sleep apnea , pneumonia, resolved, COPD,  COPD inhaler and oxygen dependent, former smoker,  Oxygen 3 L/minute during the day, and 4 L/minute at night.. Sees Dr. Halford Chessman.   Pulmonary exam normal breath sounds clear to auscultation       Cardiovascular hypertension, Pt. on medications + CAD, + Past MI, + Peripheral Vascular Disease and +CHF  Normal cardiovascular exam+ Valvular Problems/Murmurs AS  Rhythm:Regular Rate:Normal  ECG: NSR, RBBB  ECHO reviewed: Moderate aortic stenosis   Neuro/Psych PSYCHIATRIC DISORDERS Anxiety  Neuromuscular disease    GI/Hepatic Neg liver ROS, hiatal hernia, GERD  Medicated,  Endo/Other  diabetes, Type 2, Oral Hypoglycemic Agents  Renal/GU negative Renal ROS  negative genitourinary   Musculoskeletal  (+) Arthritis , Fibromyalgia -  Abdominal (+) + obese,   Peds negative pediatric ROS (+)  Hematology negative hematology ROS (+)   Anesthesia Other Findings   Reproductive/Obstetrics negative OB ROS                             Anesthesia Physical  Anesthesia Plan  ASA: IV  Anesthesia Plan: MAC   Post-op Pain Management:    Induction: Intravenous  Airway Management Planned: Nasal Cannula  Additional Equipment:   Intra-op Plan:   Post-operative Plan:   Informed Consent: I have reviewed the patients History and Physical, chart, labs and discussed the procedure including the risks, benefits and alternatives for the proposed anesthesia with the patient or authorized representative who has indicated his/her understanding and acceptance.   Dental  advisory given  Plan Discussed with: CRNA  Anesthesia Plan Comments: (Discussed spinal and general. Moderate aortic stenosis. Plan spinal. Arterial line. Discussed risks/benefits of spinal including headache, backache, failure, bleeding, infection, and nerve damage. Patient consents to spinal. Questions answered. Coagulation studies and platelet count acceptable.)        Anesthesia Quick Evaluation

## 2015-09-19 NOTE — Progress Notes (Signed)
PT Cancellation Note  Patient Details Name: Jillian Bright MRN: [id number] DOB: 11-13-1945   Cancelled Treatment:    Reason Eval/Treat Not Completed: Patient at procedure or test/unavailable (Pt for TEE and CATH per chart.  In Endo currently. )Will return tomorrow.  Thanks.     Irwin Brakeman F 09/19/2015, 9:58 AM [facility] [phone] (pager)

## 2015-09-19 NOTE — Progress Notes (Signed)
Patient admitted to Endoscopy unit. Patient moved from transport monitor to room monitor. BP low (see flowsheets) and oxygen sats low 80's on 4 liters of oxygen. Patient instructed to do some deep breathing exercises and oxygen level rose to 89%. Patient's oxygen level then dropped again to mid 80's. Patient then placed on simple mask on 8-10 liters maintain an oxygen level of 89-90%. Anesthesiologist at bedside, decision made to cancel case. Procedure MD aware and orders placed for diet and to d/c IVF. MD would see patient at bedside later today and case would be rescheduled once diuresed more. Patient alert and oriented during entire interaction, she displays understanding of everything going on and that her procedure would be rescheduled.

## 2015-09-20 ENCOUNTER — Inpatient Hospital Stay (HOSPITAL_COMMUNITY): Payer: Medicare Other

## 2015-09-20 DIAGNOSIS — R938 Abnormal findings on diagnostic imaging of other specified body structures: Secondary | ICD-10-CM

## 2015-09-20 LAB — BODY FLUID CELL COUNT WITH DIFFERENTIAL
EOS FL: 0 %
LYMPHS FL: 58 %
Monocyte-Macrophage-Serous Fluid: 6 % — ABNORMAL LOW (ref 50–90)
NEUTROPHIL FLUID: 36 % — AB (ref 0–25)
WBC FLUID: 86 uL (ref 0–1000)

## 2015-09-20 LAB — CARBOXYHEMOGLOBIN
Carboxyhemoglobin: 1.6 % — ABNORMAL HIGH (ref 0.5–1.5)
Methemoglobin: 0.6 % (ref 0.0–1.5)
O2 Saturation: 54.9 %
Total hemoglobin: 8.2 g/dL — ABNORMAL LOW (ref 12.0–16.0)

## 2015-09-20 LAB — CBC
HEMATOCRIT: 31.9 % — AB (ref 36.0–46.0)
HEMOGLOBIN: 8.3 g/dL — AB (ref 12.0–15.0)
MCH: 20.4 pg — ABNORMAL LOW (ref 26.0–34.0)
MCHC: 26 g/dL — AB (ref 30.0–36.0)
MCV: 78.4 fL (ref 78.0–100.0)
PLATELETS: 205 10*3/uL (ref 150–400)
RBC: 4.07 MIL/uL (ref 3.87–5.11)
RDW: 21.2 % — AB (ref 11.5–15.5)
WBC: 5 10*3/uL (ref 4.0–10.5)

## 2015-09-20 LAB — PROTEIN, BODY FLUID: Total protein, fluid: 3.3 g/dL

## 2015-09-20 LAB — GLUCOSE, CAPILLARY
GLUCOSE-CAPILLARY: 173 mg/dL — AB (ref 65–99)
GLUCOSE-CAPILLARY: 176 mg/dL — AB (ref 65–99)
GLUCOSE-CAPILLARY: 200 mg/dL — AB (ref 65–99)
Glucose-Capillary: 256 mg/dL — ABNORMAL HIGH (ref 65–99)

## 2015-09-20 LAB — PROTEIN, TOTAL: TOTAL PROTEIN: 6.5 g/dL (ref 6.5–8.1)

## 2015-09-20 LAB — BASIC METABOLIC PANEL
ANION GAP: 13 (ref 5–15)
BUN: 46 mg/dL — ABNORMAL HIGH (ref 6–20)
CALCIUM: 8.5 mg/dL — AB (ref 8.9–10.3)
CO2: 45 mmol/L — ABNORMAL HIGH (ref 22–32)
Chloride: 74 mmol/L — ABNORMAL LOW (ref 101–111)
Creatinine, Ser: 1.91 mg/dL — ABNORMAL HIGH (ref 0.44–1.00)
GFR, EST AFRICAN AMERICAN: 29 mL/min — AB (ref >60)
GFR, EST NON AFRICAN AMERICAN: 25 mL/min — AB (ref >60)
GLUCOSE: 242 mg/dL — AB (ref 65–99)
POTASSIUM: 4.3 mmol/L (ref 3.5–5.1)
SODIUM: 132 mmol/L — AB (ref 135–145)

## 2015-09-20 LAB — LACTATE DEHYDROGENASE: LDH: 344 U/L — ABNORMAL HIGH (ref 98–192)

## 2015-09-20 LAB — LACTATE DEHYDROGENASE, PLEURAL OR PERITONEAL FLUID: LD FL: 121 U/L — AB (ref 3–23)

## 2015-09-20 MED ORDER — METOLAZONE 2.5 MG PO TABS
2.5000 mg | ORAL_TABLET | Freq: Once | ORAL | Status: AC
  Administered 2015-09-20: 2.5 mg via ORAL
  Filled 2015-09-20: qty 1

## 2015-09-20 MED ORDER — POTASSIUM CHLORIDE CRYS ER 20 MEQ PO TBCR
40.0000 meq | EXTENDED_RELEASE_TABLET | Freq: Once | ORAL | Status: AC
  Administered 2015-09-20: 40 meq via ORAL
  Filled 2015-09-20: qty 2

## 2015-09-20 NOTE — Progress Notes (Signed)
Patient is currently on 10L HFNC with sats of 98%. Patient is in no distress and all vitals are stable. BIPAP is in room on standby but is not needed at this time. Will monitor as needed.

## 2015-09-20 NOTE — Progress Notes (Signed)
Pt woke up on the bipap confused and insisting to get out of the bed. She is oriented to herself but disoriented to place, time, and situation. She is not following RN's commands to keep her safe.

## 2015-09-20 NOTE — Progress Notes (Signed)
Observed pt holding IV tubing, noticed it had been disconnected from central line somehow and pt unable to indicate how this happened. Tubing replaced with new sterile set and charge nurse notified of potential need for safety sitter.   Paulene Floor, RN

## 2015-09-20 NOTE — Procedures (Signed)
Thoracentesis Procedure Note  Pre-operative Diagnosis: effusion rt unclear etiology  Post-operative Diagnosis: bloody effusion  Indications: rt effusion  Procedure Details  Consent: Informed consent was obtained. Risks of the procedure were discussed including: infection, bleeding, pain, pneumothorax.  Under sterile conditions the patient was positioned. Betadine solution and sterile drapes were utilized.  1% buffered lidocaine was used to anesthetize the 7th rib space. Fluid was obtained without any difficulties and minimal blood loss.  A dressing was applied to the wound and wound care instructions were provided.   Findings 600 ml of bloody pleural fluid was obtained. A sample was sent to Pathology for cytogenetics, flow, and cell counts, as well as for infection analysis.  Complications:  None; patient tolerated the procedure well.          Condition: stable  Plan A follow up chest x-ray was ordered. Bed Rest for 1 hours. Tylenol 650 mg. for pain.  Attending Attestation: I performed the procedure.     US guidance  Lavon Paganini. Titus Mould, MD, [place] Pgr: [facility] '

## 2015-09-20 NOTE — Progress Notes (Signed)
Name: Jillian Bright MRN: [id number] DOB: 30-Aug-1945    ADMISSION DATE:  09/07/2015 CONSULTATION DATE:  4/19  REFERRING MD :  [facility]   CHIEF COMPLAINT:  Lung nodule   BRIEF PATIENT DESCRIPTION: 70yo morbidly obese female former smoker (quit 2009) with multiple medical problems including COPD, OSA (intolerant to CPAP), Pulmonary HTN (PA peak pressure 61 mmHg), dCHF, Severe AS, CAD, chronic respiratory failure on home O2 initially presented 4/12 with 3 week hx BLE edema, SOB, orthopnea.  She was admitted, diuresed and seen in consultation by CVTS for possible aortic valve repair/replacement.  CTA chest to r/o PE revealed enlarging LUL pulmonary nodule and PCCM consulted.   SIGNIFICANT EVENTS  4/12 admitted for SOB 4/19 PCCM consulted for enlarging LUL nodule.  4/24 could not lay flat cath, dobutamine, lasix drip  STUDIES:  CTA chest 4/19>>>Negative for pulmonary embolus.  Left upper lobe pulmonary nodule has enlarged since the prior CT scan and could be a bronchogenic carcinoma. PET CT scan is recommended for further evaluation.  Moderate right pleural effusion with associated compressive atelectasis. 2D echo 4/13>>> 87-86%, grade 1 diastolic dysfunction, severe AS, mild MR, severely dilated RA, PA peak pressure 66mHg  HISTORY OF PRESENT ILLNESS:  69yo morbidly obese female former smoker (quit 2009) with multiple medical problems including COPD, OSA (intolerant to CPAP), Pulmonary HTN (PA peak pressure 61 mmHg), dCHF, Severe AS, CAD, chronic respiratory failure on home O2 initially presented 4/12 with 3 week hx BLE edema, SOB, orthopnea.  She was admitted, diuresed and seen in consultation by CVTS for possible aortic valve repair/replacement.  CTA chest to r/o PE revealed enlarging LUL pulmonary nodule and PCCM consulted.    SUBJECTIVE:  In chair , no distress   VITAL SIGNS: Temp:  [97.4 F (36.3 C)-98.7 F (37.1 C)] 98.4 F (36.9 C) (04/25 1137) Pulse Rate:  [57-117] 110 (04/25  1137) Resp:  [6-30] 18 (04/25 1137) BP: (72-146)/(46-113) 118/78 mmHg (04/25 1137) SpO2:  [86 %-100 %] 93 % (04/25 1137) FiO2 (%):  [55 %-100 %] 55 % (04/25 0532) Weight:  [87.952 kg (193 lb 14.4 oz)] 87.952 kg (193 lb 14.4 oz) (04/25 0532)  PHYSICAL EXAMINATION: General:  Chronically ill appearing female, NAD , in chair no distress Neuro:  Awake, alert, appropriate, MAE  HEENT:  Mm moist, no JVD  Cardiovascular:  s1s2 rrr Lungs:  reduced base, no active bronchspasm Abdomen:  Round, soft, +bs  Musculoskeletal:  Warm and dry, Gr 2 BLE edema, erythema     Recent Labs Lab 09/18/15 0445 09/19/15 0432 09/20/15 0505  NA 130* 131* 132*  K 3.8 4.2 4.3  CL 76* 73* 74*  CO2 43* 39* 45*  BUN 28* 36* 46*  CREATININE 1.12* 1.83* 1.91*  GLUCOSE 212* 254* 242*    Recent Labs Lab 09/18/15 0445 09/19/15 0432 09/20/15 0505  HGB 8.8* 8.7* 8.3*  HCT 33.9* 34.0* 31.9*  WBC 5.7 6.7 5.0  PLT 169 197 205   Dg Chest Port 1 View  09/19/2015  CLINICAL DATA:  Pleural effusion, preop for catheterization EXAM: PORTABLE CHEST 1 VIEW COMPARISON:  09/08/2015 FINDINGS: Moderately enlarged cardiac silhouette. Moderate vascular congestion with mild interstitial prominence. Small right pleural effusion with underlying consolidation similar to prior study. Right PICC line identified with tip just above the cavoatrial junction. No pneumothorax. IMPRESSION: Cardiac enlargement suggesting cardiomegaly as seen on 09/13/2015 CT scan. Vascular congestion with mild interstitial prominence suggests congestive heart failure with mild pulmonary edema. Stable small right effusion with underlying consolidation  pair Electronically Signed   By: Skipper Cliche M.D.   On: 09/19/2015 07:15    ASSESSMENT / PLAN:  LUL pulmonary nodule (11.29m x 8.758m - slightly increased from previous CT scan in 2015 COPD. Severe with an FEV1 of 1.12 or 46% OSA - intolerant to CPAP -  OHS Secondary PAH R effusion with associated RLL  collapse Pt with mod-severe AS, RV failure, acute on chronic diastolic HF exacerbation, CAD Acute on Chronic Hypoxemic Hypercapneic Resp Fx 2/2 above  REC -  The CT on 18th shows a free flowing effusion, surprised that on [place] a lung flap in way Was neg almost 2 liters but crt rise I will re assess chest [place] bedside and consider thora, it is unlikley this will be diuresed off and likley with such sig co morbids is a  Significant contribute to SOB / hypoxia svo2 noted, per cards Would repeat pcxr prior to [place] dulera, PRN SABA  Pulmonary hygiene  She would benefit from CPAP but is unable to tolerate  High risk for surgical procedure > d/w pt. Pt at standby for AV surgery.  Outpt f/u for lung nodule (likely CA)   DaLavon PaganiniFeTitus MouldMD, FAUvaldegr: [facility]

## 2015-09-20 NOTE — Progress Notes (Signed)
Patient ID: Jillian Bright, female   DOB: [birth year], 70 y.o.   MRN: [id number]    Advanced Heart Failure Rounding Note   Subjective:   70 year old female history of coronary artery disease, congestive heart failure, diabetes mellitus, oxygen dependent COPD, obstructive sleep apnea-intolerant to CPAP, pulmonary hypertension, obesity, hyperlipidemia. She was last seen by Dr. Aundra Dubin in August 2013. She initially presented with peripheral edema in 2009 and had a normal stress test and preserved EF on echocardiogram. She had a right heart catheterization which showed mild/moderate pulmonary hypertension which responded well to oxygen with decreased PA pressure. She had 2-D echocardiogram on 09/08/2015 revealing normal ejection fraction of 60-65% and wall motion. Grade 1 diastolic dysfunction. Moderate to severe aortic stenosis where previously was moderate. Mild MR. Left atrium mildly dilated. Peak PA pressure 61 mmHg. Right atrium moderately to severely dilated. Moderate tricuspid valve regurgitation.  Bedside thoracentesis not done due to higher risk from position of lung tissue.   Difficult day yesterday, worsening dyspnea/hypoxemia and CVP higher.  Hypotensive in 80s with co-ox 49%.  Unable to do TEE or cath.  Started on dobutamine for RV support.  She wore Bipap last night.  Some confusion overnight, clear today but does not remember yesterday's events well.  Now requiring nonrebreather mask.  This am, CVP 15 and co-ox 55%.    Creatinine higher at 1.9 this morning.   09/13/15 CTA: negative for PE, enlarging lung nodule noted.  Objective:   Weight Range:  Vital Signs:   Temp:  [96.3 F (35.7 C)-98.7 F (37.1 C)] 97.4 F (36.3 C) (04/25 0345) Pulse Rate:  [57-111] 67 (04/25 0532) Resp:  [6-30] 30 (04/25 0532) BP: (72-146)/(46-113) 107/65 mmHg (04/25 0400) SpO2:  [86 %-100 %] 86 % (04/25 0532) FiO2 (%):  [55 %-100 %] 55 % (04/25 0532) Weight:  [193 lb 14.4 oz (87.952 kg)] 193 lb 14.4  oz (87.952 kg) (04/25 0532) Last BM Date: 09/14/15  Weight change: Filed Weights   09/18/15 0844 09/19/15 0500 09/20/15 0532  Weight: 197 lb 15.6 oz (89.8 kg) 197 lb 12.8 oz (89.721 kg) 193 lb 14.4 oz (87.952 kg)    Intake/Output:   Intake/Output Summary (Last 24 hours) at 09/20/15 0734 Last data filed at 09/20/15 0500  Gross per 24 hour  Intake  506.6 ml  Output   2420 ml  Net -1913.4 ml     Physical Exam: General: NAD. No resp difficulty. In bed.  HEENT: normal Neck: supple. JVP 14 cm. Carotids 2+ bilat; no bruits. No lymphadenopathy or thryomegaly appreciated. Cor: PMI nondisplaced. Regular rate & rhythm. No rubs, gallops.  3/6 crescendo-decrescendo murmur RUSB with muffled S2. Lungs: RML RLL decreased. On 3 liters oxygen Abdomen: soft, nontender, nondistended. No hepatosplenomegaly. No bruits or masses. Good bowel sounds. Extremities: no cyanosis, clubbing, rash.  R and LLE 2+ edema to knees, wearing unna boots. Neuro: alert & orientedx3, cranial nerves grossly intact. moves all 4 extremities w/o difficulty. Affect pleasant  Telemetry: ST 100s   Labs: Basic Metabolic Panel:  Recent Labs Lab 09/16/15 0535 09/17/15 0445 09/18/15 0445 09/19/15 0432 09/20/15 0505  NA 135 134* 130* 131* 132*  K 3.8 3.8 3.8 4.2 4.3  CL 82* 81* 76* 73* 74*  CO2 42* 40* 43* 39* 45*  GLUCOSE 199* 201* 212* 254* 242*  BUN 27* 26* 28* 36* 46*  CREATININE 0.94 0.99 1.12* 1.83* 1.91*  CALCIUM 8.8* 9.1 9.0 9.2 8.5*  MG  --   --  1.2*  --   --  Liver Function Tests:  Recent Labs Lab 09/14/15 0402  AST 23  ALT 17  ALKPHOS 53  BILITOT 1.2  PROT 6.7  ALBUMIN 3.3*   No results for input(s): LIPASE, AMYLASE in the last 168 hours. No results for input(s): AMMONIA in the last 168 hours.  CBC:  Recent Labs Lab 09/17/15 0445 09/18/15 0445 09/19/15 0432 09/20/15 0505  WBC 5.5 5.7 6.7 5.0  HGB 9.3* 8.8* 8.7* 8.3*  HCT 34.6* 33.9* 34.0* 31.9*  MCV 79.0 77.8* 78.7 78.4  PLT  160 169 197 205    Cardiac Enzymes: No results for input(s): CKTOTAL, CKMB, CKMBINDEX, TROPONINI in the last 168 hours.  BNP: BNP (last 3 results)  Recent Labs  11/18/14 1015 09/07/15 2000  BNP 176.5* 848.6*    ProBNP (last 3 results) No results for input(s): PROBNP in the last 8760 hours.    Other results:  Imaging: Dg Chest Port 1 View  09/19/2015  CLINICAL DATA:  Pleural effusion, preop for catheterization EXAM: PORTABLE CHEST 1 VIEW COMPARISON:  09/08/2015 FINDINGS: Moderately enlarged cardiac silhouette. Moderate vascular congestion with mild interstitial prominence. Small right pleural effusion with underlying consolidation similar to prior study. Right PICC line identified with tip just above the cavoatrial junction. No pneumothorax. IMPRESSION: Cardiac enlargement suggesting cardiomegaly as seen on 09/13/2015 CT scan. Vascular congestion with mild interstitial prominence suggests congestive heart failure with mild pulmonary edema. Stable small right effusion with underlying consolidation pair Electronically Signed   By: Skipper Cliche M.D.   On: 09/19/2015 07:15     Medications:     Scheduled Medications: . sodium chloride   Intravenous Once  . acetaZOLAMIDE  250 mg Oral BID  . aspirin  81 mg Oral Daily  . atorvastatin  20 mg Oral Daily  . darifenacin  7.5 mg Oral Daily  . DULoxetine  60 mg Oral Daily  . fentaNYL  25 mcg Transdermal Q72H  . gabapentin  300 mg Oral TID  . insulin aspart  0-5 Units Subcutaneous QHS  . insulin aspart  0-9 Units Subcutaneous TID WC  . insulin aspart  3 Units Subcutaneous TID WC  . insulin glargine  5 Units Subcutaneous QHS  . metolazone  2.5 mg Oral Once  . mometasone-formoterol  2 puff Inhalation BID  . pantoprazole  40 mg Oral Daily  . polyethylene glycol  17 g Oral Daily  . potassium chloride  40 mEq Oral Once  . sodium chloride  250 mL Intravenous Once  . sodium chloride flush  3 mL Intravenous Q12H  . traZODone  50 mg  Oral QHS    Infusions: . DOBUTamine 5 mcg/kg/min (09/19/15 1546)  . furosemide (LASIX) infusion 12 mg/hr (09/19/15 1221)    PRN Medications: sodium chloride, ALPRAZolam, ipratropium-albuterol, ondansetron (ZOFRAN) IV, oxyCODONE-acetaminophen, sodium chloride flush, sodium chloride flush   Assessment/Plan/Discussion     1. 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 Echo 4/17 with EF 60-65%, dilated and dysfunctional RV, PASP 61 mmHg, moderate to severe AS with mean gradient 34 and AVA 0.9 cm^2, moderate to severe TR. On exam, she is still volume overloaded though weight came down with diuresis yesterday.  Now on dobutamine for RV support.  Co-ox 55% and CVP 15.   - Continue Lasix gtt due to ongoing hypoxemia and elevated CVP. Will add dose of metolazone 2.5 x 1 again today.  Will need to follow rising creatinine closely.  - Significant right pleural effusion.  Given position of  lung tissue on [place], thoracentesis was not done.  - Unna boots in place.  2. Aortic stenosis: Possibly severe. Clearly progressed from prior echo (several years ago). She will need TEE to assess more closely. Treatment of the aortic valve will be tricky. Anesthesia for a definitive procedure will be quite risky given her pulmonary disease (COPD and OHS/OSA). I think that she will do better with TAVR than with open AVR. There is concern that TAVR will not address the TR, however this may improve with diuresis and decreased RVEDP. Per pulmonary, high risk for anesthesia.  We have not been able to do TEE yet due to respiratory status (had to abort procedure Monday).  Perhaps TEE tomorrow if we can stabilized her respiratory status.  3. Pulmonary hypertension: She has a history of pulmonary hypertension thought to be due to OHS/OSA. Suspect her Rector currently is a combination of OHS/OSA and pulmonary venous hypertension from volume overload.  - Will diurese better, then RHC.  4.  OHS/OSA: She is on home O2 chronically. Had not been able to tolerate CPAP in the past but did fine with Bipap last night.  Will have her wear CPAP at night.  5. COPD: On home O2, no longer smokes.  6. CAD: h/o RCA PCI. Will need coronary angiography prior to any consideration of valve intervention but will need to be able to lie more flat and will need creatinine stable. Continue ASA 81 and statin.  7. Tricuspid regurgitation: Moderate to severe. Will reassess by TEE after more diuresis. This may improve with diuresis.  8. Pulmonary nodule: Enlarging pulmonary nodule concerning for bronchogenic carcinoma, especially with smoking history. This will have to be addressed after her heart issues are resolved. Will need PET scan after cardiac issues stabilized.  9. AKI: Creatinine to 1.9 today.  Difficult given need for further diuresis.  Hopefully RV support with dobutamine will help.  Will reassess tomorrow.    35 minutes critical care time.   Loralie Champagne 09/20/2015 7:34 AM

## 2015-09-20 NOTE — Progress Notes (Signed)
PROGRESS NOTE    Jillian Bright  WUJ:[id number] DOB: 07/20/1945 DOA: 09/07/2015 PCP: [name], MD  Outpatient Specialists:     Brief Narrative: 70 year old female with history of diabetes mellitus, COPD, sleep apnea, pulmonary hypertension, obesity, history of coronary artery disease, congestive heart failure, who presents to the ER with worsening bilateral lower extremity swelling and shortness of breath with orthopnea.Patient is known history of COPD Oxygen ,3 liters at baseline as well as history of diastolic heart failure last echogram was in 2015 showing EF of 95%-62 diastolic,chronic1 diastolic heart failure and moderate aortic stenosis. Patient found to have left upper lobe pulmonary nodule which has enlarged since prior CT from 2015.patient was started on dopamine by cardiology, which has now been discontinued.patient was moved to step down, and aggressively diuresed. Cardiac cath was attempted but could not be done due to significant dyspnea from heart failure. Patient now started on Dobutamine and Lasix.   Assessment & Plan:   Principal Problem:   Acute on chronic diastolic (congestive) heart failure (HCC) Active Problems:   Essential hypertension   Pulmonary HTN (HCC)   Chronic diastolic heart failure (HCC)   COPD with emphysema (HCC)   Obesity hypoventilation syndrome (HCC)   CAD (coronary artery disease)   Aortic stenosis   DM type 2 (diabetes mellitus, type 2) (HCC)   On home oxygen therapy   Microcytic anemia   CHF (congestive heart failure) (HCC)   CHF exacerbation (HCC)   Abnormal chest x-ray   Acute on chronic congestive heart failure (HCC)   Morbid obesity (HCC)   Pleural effusion on right   Tricuspid regurgitation   Right-sided congestive heart failure (HCC)   Mitral regurgitation   Acute hypoxemic respiratory failure (HCC)   OSA (obstructive sleep apnea)   Pleural effusion   Chronic obstructive pulmonary disease (HCC)   Lung nodule   Acute on  chronic diastolic CHF (congestive heart failure), NYHA class 1 ([place]) - also likely secondary to noncompliance - patient admitted to not taking lasix prior to admission because she did not feel she needed it Continue telemetry Serial trop remained had stable at 0.04. Suspect troponin leak in the setting of acute CHF Repeat 2-D echo with EF 60-65% with mod-severe AS  Cont to hold off on ACE/ARBi due to renal insufficiency  Wt today 97.9<89.6< 91.17kg<-90.8kg   Net neg -19.7 L since admit Currently patient on IV Lasix for aggressive diuresis per cardiology.  Cardiac cath was attempted but could not be done due to significant dyspnea from heart failure. Patient now started on Dobutamine and Lasix  Acute kidney injury The patient's creatinine is 1.91 up from 1.83. Secondary to aggressive diuresis Patient on Dobutamine and lasix , check bmp in am.  Pleural effusion Pulmonary attempted thoracentesis yesterday, unable to perform due to lung tissue on the way.   Lung nodule CT angiogram showed left upper lobe pulmonary nodule which has enlarged since the prior CT from 2015. PET/CT recommended for further evaluation Pulmonary was consulted for further evaluation.At this time pulmonology recommends outpatient PET/CT.  Severe AS  Valve area <1cm  Consulted CT Surgery Will need LHC and RHC if she goes to surgery  CAD (coronary artery disease) chronic continue home medications  Essential hypertension chronic continue home medications    Microcytic anemia Plan to cont to transfuse for hemoglobin below 7 or if significantly symptomatic. Given IV iron. Pt is iron deficient . Patient will need ferrous sulfate on discharge and possible GI evaluation as outpatient -  Hgb down to 6.9 on 4/16, appropriately responded to 2 units of PRBC's -Hgb remains stable at 8.3   COPD with emphysema (HCC) stable continue home oxygen and inhalers  Diabetes mellitus hold by mouth medications order sliding scale,  cont low-dose Lantus  Leg edema suspect likely secondary to heart failure. LE dopplers neg for DVT    DVT prophylaxis: SCD's Code Status:DNR Family Communication: No family present at bedside Disposition Plan: Pending treatment for severe aortic stenosis   Consultants:   CT surgery  Cardiology  Procedures:   None  Antimicrobials:  None  Subjective: Patient seen and examined this morning. Denies chest pain or shortness of breath. Started on Dobutamine and Lasix.  Objective: Filed Vitals:   09/20/15 0800 09/20/15 0900 09/20/15 0928 09/20/15 0939  BP: 117/65 103/60    Pulse: 105 110 112 117  Temp:      TempSrc:      Resp: '15 14 18 15  '$ Height:      Weight:      SpO2: 95% 88% 91% 90%    Intake/Output Summary (Last 24 hours) at 09/20/15 1041 Last data filed at 09/20/15 0700  Gross per 24 hour  Intake    544 ml  Output   2420 ml  Net  -1876 ml   Filed Weights   09/18/15 0844 09/19/15 0500 09/20/15 0532  Weight: 89.8 kg (197 lb 15.6 oz) 89.721 kg (197 lb 12.8 oz) 87.952 kg (193 lb 14.4 oz)    Examination:  General exam: Appears calm and comfortable, sitting in chair  Respiratory system: Bibasilar crackles.Respiratory effort normal. Cardiovascular system: S1 & S2 heard, RRR. No JVD, murmurs, rubs, gallops or clicks.  Gastrointestinal system: Abdomen is nontender. No organomegaly or masses felt. Posl bowel sounds Central nervous system: Alert and oriented. No focal neurological deficits. Extremities: Symmetric 5 x 5 power. B LE pitting edema Skin: No rashes, lesions or ulcers Psychiatry: Judgement and insight appear normal. Mood & affect appropriate.     Data Reviewed: I have personally reviewed following labs and imaging studies  CBC:  Recent Labs Lab 09/17/15 0445 09/18/15 0445 09/19/15 0432 09/20/15 0505  WBC 5.5 5.7 6.7 5.0  HGB 9.3* 8.8* 8.7* 8.3*  HCT 34.6* 33.9* 34.0* 31.9*  MCV 79.0 77.8* 78.7 78.4  PLT 160 169 197 403   Basic Metabolic  Panel:  Recent Labs Lab 09/16/15 0535 09/17/15 0445 09/18/15 0445 09/19/15 0432 09/20/15 0505  NA 135 134* 130* 131* 132*  K 3.8 3.8 3.8 4.2 4.3  CL 82* 81* 76* 73* 74*  CO2 42* 40* 43* 39* 45*  GLUCOSE 199* 201* 212* 254* 242*  BUN 27* 26* 28* 36* 46*  CREATININE 0.94 0.99 1.12* 1.83* 1.91*  CALCIUM 8.8* 9.1 9.0 9.2 8.5*  MG  --   --  1.2*  --   --    GFR: Estimated Creatinine Clearance: 30.2 mL/min (by C-G formula based on Cr of 1.91). Liver Function Tests:  Recent Labs Lab 09/14/15 0402  AST 23  ALT 17  ALKPHOS 53  BILITOT 1.2  PROT 6.7  ALBUMIN 3.3*   CBG:  Recent Labs Lab 09/19/15 0817 09/19/15 1133 09/19/15 1655 09/19/15 2125 09/20/15 0757  GLUCAP 200* 188* 208* 193* 173*   Urine analysis:    Component Value Date/Time   COLORURINE YELLOW 09/13/2015 2230   APPEARANCEUR CLEAR 09/13/2015 2230   LABSPEC 1.031* 09/13/2015 2230   PHURINE 6.0 09/13/2015 2230   GLUCOSEU NEGATIVE 09/13/2015 2230   HGBUR NEGATIVE 09/13/2015  2230   HGBUR negative 08/04/2008 [place] 09/13/2015 2230   BILIRUBINUR n 11/24/2012 1335   KETONESUR NEGATIVE 09/13/2015 2230   PROTEINUR NEGATIVE 09/13/2015 2230   PROTEINUR trace 11/24/2012 1335   UROBILINOGEN 0.2 11/18/2014 0750   UROBILINOGEN 0.2 11/24/2012 1335   NITRITE NEGATIVE 09/13/2015 2230   NITRITE n 11/24/2012 1335   LEUKOCYTESUR NEGATIVE 09/13/2015 2230    Radiology Studies: Dg Chest Port 1 View  09/19/2015  CLINICAL DATA:  Pleural effusion, preop for catheterization EXAM: PORTABLE CHEST 1 VIEW COMPARISON:  09/08/2015 FINDINGS: Moderately enlarged cardiac silhouette. Moderate vascular congestion with mild interstitial prominence. Small right pleural effusion with underlying consolidation similar to prior study. Right PICC line identified with tip just above the cavoatrial junction. No pneumothorax. IMPRESSION: Cardiac enlargement suggesting cardiomegaly as seen on 09/13/2015 CT scan. Vascular  congestion with mild interstitial prominence suggests congestive heart failure with mild pulmonary edema. Stable small right effusion with underlying consolidation pair Electronically Signed   By: Skipper Cliche M.D.   On: 09/19/2015 07:15        Scheduled Meds: . sodium chloride   Intravenous Once  . acetaZOLAMIDE  250 mg Oral BID  . aspirin  81 mg Oral Daily  . atorvastatin  20 mg Oral Daily  . darifenacin  7.5 mg Oral Daily  . DULoxetine  60 mg Oral Daily  . fentaNYL  25 mcg Transdermal Q72H  . gabapentin  300 mg Oral TID  . insulin aspart  0-5 Units Subcutaneous QHS  . insulin aspart  0-9 Units Subcutaneous TID WC  . insulin aspart  3 Units Subcutaneous TID WC  . insulin glargine  5 Units Subcutaneous QHS  . mometasone-formoterol  2 puff Inhalation BID  . pantoprazole  40 mg Oral Daily  . polyethylene glycol  17 g Oral Daily  . sodium chloride  250 mL Intravenous Once  . sodium chloride flush  3 mL Intravenous Q12H  . traZODone  50 mg Oral QHS   Continuous Infusions: . DOBUTamine 5 mcg/kg/min (09/19/15 1546)  . furosemide (LASIX) infusion 12 mg/hr (09/20/15 1003)     LOS: 13 days    LAMA,GAGAN S, MD [facility] Pager [phone]  If 7PM-7AM, please contact night-coverage [email or web address] Password [facility] 09/20/2015, 10:41 AM

## 2015-09-20 NOTE — Procedures (Signed)
[place] chest  1 left no effusion at all 2. Rt moderate to large effusion, lung flap 6 cm or further away Not loculated ,is free flowinjg  Will proceed with [place]born Heron. Titus Mould, MD, [place] Pgr: [facility]

## 2015-09-20 NOTE — Progress Notes (Signed)
Physical Therapy Treatment Patient Details Name: Jillian Bright MRN: [id number] DOB: 1946-01-06 Today's Date: 09/20/2015    History of Present Illness Jillian Bright is a 70 y.o. female has a past medical history of Diabetes mellitus; COPD (chronic obstructive pulmonary disease); Sleep apnea, obstructive; Pulmonary HTN ; Obesity; Hyperlipidemia; Myocardial infarction; Coronary artery disease; Anxiety; Shortness of breath; GERD (gastroesophageal reflux disease); Arthritis; CHF (congestive heart failure); Pneumonia; Bronchitis; and History of hiatal hernia. Admitted for Acute on chronic diastolic CHF. CT scan negative for PE, + Left upper lobe pulmonary nodule; decr respiratory status with move to stepdown unit 4/19. RLL effusion and considering thoracentesis    PT Comments    Patient fluctuating between NRB mask and HFNC this morning. Confusion persists with patient now having a sitter. Patient up in chair and able to follow commands to complete leg exercises, however did need frequent re-direction due to easily distracted. At rest on 15L, SaO2 97% HR 100; lowest saturation 91% during exercises. ?reattempt thoracentesis today.   Follow Up Recommendations  SNF;Supervision/Assistance - 24 hour     Equipment Recommendations  None recommended by PT    Recommendations for Other Services       Precautions / Restrictions Precautions Precautions: Fall Precaution Comments: monitor O2 an BP Restrictions Weight Bearing Restrictions: No    Mobility  Bed Mobility               General bed mobility comments: up in recliner   Transfers                 General transfer comment: did not attempt as pt desaturated <80% (per NT) with pivot to chair;;able to boost her hips and scoot back into chair x 2 with SaO2>91%  Ambulation/Gait                 Stairs            Wheelchair Mobility    Modified Rankin (Stroke Patients Only)       Balance     Sitting  balance-Leahy Scale: Good                              Cognition Arousal/Alertness: Awake/alert Behavior During Therapy: Restless Overall Cognitive Status: Impaired/Different from baseline Area of Impairment: Memory;Safety/judgement;Following commands;Awareness;Problem solving     Memory: Decreased short-term memory Following Commands: Follows one step commands inconsistently (easily distracted) Safety/Judgement: Decreased awareness of deficits;Decreased awareness of safety   Problem Solving: Slow processing;Requires verbal cues;Requires tactile cues General Comments: very restless/fidgeting with sheet    Exercises General Exercises - Lower Extremity Ankle Circles/Pumps: AAROM;Both;10 reps Quad Sets: AROM;Both;10 reps Gluteal Sets: AROM;Both;5 reps Long Arc Quad: Strengthening;Both;5 reps;Seated (resisted knee extension and flexion) Heel Raises: AROM;Both;10 reps;Seated (pressing down thru toes and lifting heels)    General Comments General comments (skin integrity, edema, etc.): has sitter to protect multiple lines      Pertinent Vitals/Pain Pain Assessment: No/denies pain    Home Living                      Prior Function            PT Goals (current goals can now be found in the care plan section) Acute Rehab PT Goals Patient Stated Goal: unable to state due to confusion Time For Goal Achievement: 09/22/15 Progress towards PT goals: Not progressing toward goals - comment (decr respiratory status)  Frequency  Min 2X/week    PT Plan Current plan remains appropriate;Frequency needs to be updated    Co-evaluation             End of Session Equipment Utilized During Treatment: Oxygen Activity Tolerance: Treatment limited secondary to medical complications (Comment) (decr respiratory status) Patient left: in chair;with call bell/phone within reach;with nursing/sitter in room;with family/visitor present     Time: 7276-1848 PT Time  Calculation (min) (ACUTE ONLY): 18 min  Charges:  $Therapeutic Exercise: 8-22 mins                    G Codes:      Abrahm Mancia 2015/10/06, 1:09 PM Pager [phone]

## 2015-09-21 ENCOUNTER — Inpatient Hospital Stay (HOSPITAL_COMMUNITY): Payer: Medicare Other

## 2015-09-21 ENCOUNTER — Inpatient Hospital Stay (HOSPITAL_COMMUNITY): Payer: Medicare Other | Admitting: Anesthesiology

## 2015-09-21 ENCOUNTER — Encounter (HOSPITAL_COMMUNITY)
Admission: EM | Disposition: A | Discharge status: Skilled Nursing Facility | Payer: Self-pay | Source: Home / Self Care | Attending: Internal Medicine

## 2015-09-21 ENCOUNTER — Encounter (HOSPITAL_COMMUNITY): Payer: Self-pay

## 2015-09-21 DIAGNOSIS — D509 Iron deficiency anemia, unspecified: Secondary | ICD-10-CM

## 2015-09-21 DIAGNOSIS — N179 Acute kidney failure, unspecified: Secondary | ICD-10-CM

## 2015-09-21 DIAGNOSIS — J449 Chronic obstructive pulmonary disease, unspecified: Secondary | ICD-10-CM

## 2015-09-21 DIAGNOSIS — I350 Nonrheumatic aortic (valve) stenosis: Secondary | ICD-10-CM

## 2015-09-21 DIAGNOSIS — E1100 Type 2 diabetes mellitus with hyperosmolarity without nonketotic hyperglycemic-hyperosmolar coma (NKHHC): Secondary | ICD-10-CM

## 2015-09-21 DIAGNOSIS — Z9981 Dependence on supplemental oxygen: Secondary | ICD-10-CM

## 2015-09-21 HISTORY — PX: TEE WITHOUT CARDIOVERSION: SHX5443

## 2015-09-21 LAB — HEPATIC FUNCTION PANEL
ALBUMIN: 3.4 g/dL — AB (ref 3.5–5.0)
ALT: 21 U/L (ref 14–54)
AST: 35 U/L (ref 15–41)
Alkaline Phosphatase: 53 U/L (ref 38–126)
BILIRUBIN DIRECT: 0.2 mg/dL (ref 0.1–0.5)
Indirect Bilirubin: 1.1 mg/dL — ABNORMAL HIGH (ref 0.3–0.9)
TOTAL PROTEIN: 7.1 g/dL (ref 6.5–8.1)
Total Bilirubin: 1.3 mg/dL — ABNORMAL HIGH (ref 0.3–1.2)

## 2015-09-21 LAB — GLUCOSE, CAPILLARY
GLUCOSE-CAPILLARY: 181 mg/dL — AB (ref 65–99)
GLUCOSE-CAPILLARY: 105 mg/dL — AB (ref 65–99)
GLUCOSE-CAPILLARY: 96 mg/dL (ref 65–99)
GLUCOSE-CAPILLARY: 189 mg/dL — AB (ref 65–99)

## 2015-09-21 LAB — BASIC METABOLIC PANEL
ANION GAP: 15 (ref 5–15)
BUN: 45 mg/dL — ABNORMAL HIGH (ref 6–20)
BUN: 42 mg/dL — ABNORMAL HIGH (ref 6–20)
CALCIUM: 9.1 mg/dL (ref 8.9–10.3)
CHLORIDE: 68 mmol/L — AB (ref 101–111)
CHLORIDE: 69 mmol/L — AB (ref 101–111)
CO2: >50 mmol/L — ABNORMAL HIGH (ref 22–32)
CO2: 50 mmol/L — ABNORMAL HIGH (ref 22–32)
CREATININE: 1.58 mg/dL — AB (ref 0.44–1.00)
CREATININE: 1.33 mg/dL — AB (ref 0.44–1.00)
Calcium: 9.5 mg/dL (ref 8.9–10.3)
GFR calc non Af Amer: 32 mL/min — ABNORMAL LOW (ref >60)
GFR calc non Af Amer: 39 mL/min — ABNORMAL LOW (ref >60)
GFR, EST AFRICAN AMERICAN: 37 mL/min — AB (ref >60)
GFR, EST AFRICAN AMERICAN: 45 mL/min — AB (ref >60)
Glucose, Bld: 206 mg/dL — ABNORMAL HIGH (ref 65–99)
Glucose, Bld: 123 mg/dL — ABNORMAL HIGH (ref 65–99)
POTASSIUM: 3.4 mmol/L — AB (ref 3.5–5.1)
Potassium: 2.8 mmol/L — ABNORMAL LOW (ref 3.5–5.1)
SODIUM: 133 mmol/L — AB (ref 135–145)
SODIUM: 134 mmol/L — AB (ref 135–145)

## 2015-09-21 LAB — CARBOXYHEMOGLOBIN
CARBOXYHEMOGLOBIN: 2.9 % — AB (ref 0.5–1.5)
Carboxyhemoglobin: 2.3 % — ABNORMAL HIGH (ref 0.5–1.5)
Methemoglobin: 0.8 % (ref 0.0–1.5)
Methemoglobin: 0.9 % (ref 0.0–1.5)
O2 SAT: 90.5 %
O2 Saturation: 76.6 %
TOTAL HEMOGLOBIN: 8.9 g/dL — AB (ref 12.0–16.0)
Total hemoglobin: 8.9 g/dL — ABNORMAL LOW (ref 12.0–16.0)

## 2015-09-21 LAB — CBC
HCT: 32.4 % — ABNORMAL LOW (ref 36.0–46.0)
Hemoglobin: 8.5 g/dL — ABNORMAL LOW (ref 12.0–15.0)
MCH: 20.4 pg — AB (ref 26.0–34.0)
MCHC: 26.2 g/dL — ABNORMAL LOW (ref 30.0–36.0)
MCV: 77.9 fL — AB (ref 78.0–100.0)
PLATELETS: 202 10*3/uL (ref 150–400)
RBC: 4.16 MIL/uL (ref 3.87–5.11)
RDW: 21.2 % — AB (ref 11.5–15.5)
WBC: 5.9 10*3/uL (ref 4.0–10.5)

## 2015-09-21 LAB — CHOLESTEROL, TOTAL: Cholesterol: 125 mg/dL (ref 0–200)

## 2015-09-21 LAB — MAGNESIUM: Magnesium: 1.4 mg/dL — ABNORMAL LOW (ref 1.7–2.4)

## 2015-09-21 LAB — RHEUMATOID FACTORS, FLUID: Rheumatoid Arthritis, Qn/Fluid: NEGATIVE

## 2015-09-21 SURGERY — RIGHT/LEFT HEART CATH AND CORONARY ANGIOGRAPHY
Anesthesia: LOCAL

## 2015-09-21 SURGERY — ECHOCARDIOGRAM, TRANSESOPHAGEAL
Anesthesia: Monitor Anesthesia Care

## 2015-09-21 MED ORDER — DOBUTAMINE-DEXTROSE 2-5 MG/ML-% IV SOLN
INTRAVENOUS | Status: DC | PRN
  Administered 2015-09-21: 6.8 ug/kg/min via INTRAVENOUS

## 2015-09-21 MED ORDER — MAGNESIUM SULFATE 4 GM/100ML IV SOLN
4.0000 g | Freq: Once | INTRAVENOUS | Status: DC
  Filled 2015-09-21: qty 100

## 2015-09-21 MED ORDER — MIDAZOLAM HCL 5 MG/5ML IJ SOLN
INTRAMUSCULAR | Status: DC | PRN
  Administered 2015-09-21: 1 mg via INTRAVENOUS

## 2015-09-21 MED ORDER — POTASSIUM CHLORIDE 10 MEQ/100ML IV SOLN
10.0000 meq | INTRAVENOUS | Status: AC
  Administered 2015-09-21 (×4): 10 meq via INTRAVENOUS
  Filled 2015-09-21 (×4): qty 100

## 2015-09-21 MED ORDER — LACTATED RINGERS IV SOLN
INTRAVENOUS | Status: DC | PRN
  Administered 2015-09-21: 14:00:00 via INTRAVENOUS

## 2015-09-21 MED ORDER — PREDNISONE 20 MG PO TABS
20.0000 mg | ORAL_TABLET | Freq: Every day | ORAL | Status: DC
  Administered 2015-09-21 – 2015-09-23 (×3): 20 mg via ORAL
  Filled 2015-09-21 (×3): qty 1

## 2015-09-21 MED ORDER — POTASSIUM CHLORIDE 10 MEQ/100ML IV SOLN
10.0000 meq | INTRAVENOUS | Status: DC
  Filled 2015-09-21 (×4): qty 100

## 2015-09-21 MED ORDER — POTASSIUM CHLORIDE CRYS ER 20 MEQ PO TBCR: 40.0000 meq | EXTENDED_RELEASE_TABLET | Freq: Once | ORAL | Status: AC

## 2015-09-21 MED ORDER — FENTANYL 12 MCG/HR TD PT72: 12.5000 ug | MEDICATED_PATCH | TRANSDERMAL | Status: DC

## 2015-09-21 MED ORDER — METOLAZONE 2.5 MG PO TABS
2.5000 mg | ORAL_TABLET | Freq: Once | ORAL | Status: AC
  Administered 2015-09-21: 2.5 mg via ORAL
  Filled 2015-09-21: qty 1

## 2015-09-21 MED ORDER — MAGNESIUM SULFATE 2 GM/50ML IV SOLN
2.0000 g | Freq: Once | INTRAVENOUS | Status: AC
  Administered 2015-09-21: 2 g via INTRAVENOUS
  Filled 2015-09-21: qty 50

## 2015-09-21 MED ORDER — ACETAZOLAMIDE 250 MG PO TABS
500.0000 mg | ORAL_TABLET | Freq: Two times a day (BID) | ORAL | Status: DC
  Administered 2015-09-21 – 2015-09-22 (×3): 500 mg via ORAL
  Filled 2015-09-21 (×3): qty 2

## 2015-09-21 MED ORDER — POTASSIUM CHLORIDE CRYS ER 20 MEQ PO TBCR
40.0000 meq | EXTENDED_RELEASE_TABLET | Freq: Once | ORAL | Status: AC
  Administered 2015-09-21: 40 meq via ORAL
  Filled 2015-09-21: qty 2

## 2015-09-21 MED ORDER — POTASSIUM CHLORIDE CRYS ER 20 MEQ PO TBCR
40.0000 meq | EXTENDED_RELEASE_TABLET | Freq: Once | ORAL | Status: AC
Start: 2015-09-21 — End: 2015-09-21
  Administered 2015-09-21: 40 meq via ORAL
  Filled 2015-09-21: qty 2

## 2015-09-21 MED ORDER — SENNOSIDES-DOCUSATE SODIUM 8.6-50 MG PO TABS
2.0000 | ORAL_TABLET | Freq: Two times a day (BID) | ORAL | Status: DC
  Administered 2015-09-21 – 2015-09-24 (×7): 2 via ORAL
  Filled 2015-09-21 (×7): qty 2

## 2015-09-21 NOTE — CV Procedure (Signed)
Procedure: TEE  Indication: Aortic stenosis.   Sedation: Versed 1 mg IV x 1 with anesthesia available.   Findings:  Please see echo section for full report.  Normal LV size with D-shaped septum suggesting RV pressure/volume overload.  LV EF 45-50%, diffuse hypokinesis.  Severely dilated RV with mild to moderate systolic dysfunction.  Mild left atrial enlargement, no LA appendage thrombus.  Moderate to severe right atrial enlargement.  There was moderate to severe tricuspid regurgitation.  Mild to moderate mitral regurgitation.  The aortic valve was trileaflet with severe calcification.  There was moderate to severe aortic stenosis with mean gradient 37 mmHg and AVA 1.0 cm^2.  Trivial aortic insufficiency.  No ASD or PFO.  Normal caliber aorta with grade III plaque in the descending thoracic aorta.    Aortic stenosis is truly borderline, moderate to severe.    Jillian Bright 09/21/2015 2:41 PM

## 2015-09-21 NOTE — Anesthesia Preprocedure Evaluation (Addendum)
Anesthesia Evaluation  Patient identified by MRN, date of birth, ID band Patient awake  General Assessment Comment:. Diabetes mellitus  . COPD (chronic obstructive pulmonary disease) ([place])    sees Dr. [name] . Sleep apnea, obstructive  . Pulmonary HTN ([place])  . Obesity (BMI 30.0-34.9)  . Hyperlipidemia  . Myocardial infarction ([place])  . Coronary artery disease  . Anxiety  . Shortness of breath  . GERD (gastroesophageal reflux disease)  . Arthritis    rheumatoid, sees Dr. Tobie Lords . CHF (congestive heart failure) ([place])  . Pneumonia  . Bronchitis    hx of  . History of hiatal hernia        Reviewed: Allergy & Precautions, NPO status , Patient's Chart, lab work & pertinent test results  Airway Mallampati: II  TM Distance: >3 FB Neck ROM: Full    Dental no notable dental hx.    Pulmonary shortness of breath and with exertion, sleep apnea , pneumonia, COPD,  COPD inhaler and oxygen dependent, former smoker,  Currently on 15 L/minute oxygen by cannula. Does not look obviously SOB.   Pulmonary exam normal breath sounds clear to auscultation       Cardiovascular hypertension, Pt. on medications + CAD, + Past MI, + Peripheral Vascular Disease and +CHF  Normal cardiovascular exam+ Valvular Problems/Murmurs AS  Rhythm:Regular Rate:Normal  ECHO 09-08-15:  Study Conclusions  - Left ventricle: The cavity size was normal. Wall thickness was  normal. Systolic function was normal. The estimated ejection  fraction was in the range of 60% to 65%. Wall motion was normal;  there were no regional wall motion abnormalities. There was  fusion of early and atrial contributions to ventricular filling.  Doppler parameters are consistent with abnormal left ventricular  relaxation (grade 1 diastolic dysfunction). - Aortic valve: Severely calcified annulus. Severely  thickened,  severely calcified leaflets. There was moderate to severe  stenosis. Valve area (VTI): 0.95 cm^2. Valve area (Vmax): 0.94  cm^2. Valve area (Vmean): 0.91 cm^2. - Mitral valve: Mildly to moderately calcified annulus. There was  mild regurgitation. Valve area by continuity equation (using LVOT  flow): 2.43 cm^2. - Left atrium: The atrium was mildly dilated. - Right ventricle: The cavity size was mildly dilated. Systolic  function was mildly reduced. - Right atrium: The atrium was moderately to severely dilated. - Tricuspid valve: There was moderate regurgitation. - Pulmonary arteries: Systolic pressure was moderately increased.  PA peak pressure: 61 mm Hg (S).   Neuro/Psych PSYCHIATRIC DISORDERS Anxiety Possibly some confusion.  Neuromuscular disease negative psych ROS   GI/Hepatic Neg liver ROS, hiatal hernia, GERD  ,  Endo/Other  diabetes, Type 2, Oral Hypoglycemic Agents  Renal/GU Renal InsufficiencyRenal diseaseCr 1.58 K 2.8  negative genitourinary   Musculoskeletal  (+) Arthritis , Fibromyalgia -  Abdominal   Peds negative pediatric ROS (+)  Hematology negative hematology ROS (+) anemia ,   Anesthesia Other Findings   Reproductive/Obstetrics negative OB ROS                          Anesthesia Physical Anesthesia Plan  ASA: IV  Anesthesia Plan: MAC   Post-op Pain Management:    Induction: Intravenous  Airway Management Planned: Natural Airway  Additional Equipment:   Intra-op Plan:   Post-operative Plan:   Informed Consent: I have reviewed the patients History and Physical, chart, labs and discussed the procedure including the risks, benefits and alternatives for the proposed anesthesia with the patient or  authorized representative who has indicated his/her understanding and acceptance.   Dental advisory given  Plan Discussed with: CRNA  Anesthesia Plan Comments: (Apparently her respiratory status has  improved markedly in the last few days. 6 liter diuresis. Plan relatively light sedation.)       Anesthesia Quick Evaluation

## 2015-09-21 NOTE — Anesthesia Postprocedure Evaluation (Signed)
Anesthesia Post Note  Patient: Jillian Bright  Procedure(s) Performed: Procedure(s) (LRB): TRANSESOPHAGEAL ECHOCARDIOGRAM (TEE) (N/A)  Patient location during evaluation: PACU Anesthesia Type: MAC Level of consciousness: awake and alert Pain management: pain level controlled Vital Signs Assessment: post-procedure vital signs reviewed and stable Respiratory status: spontaneous breathing, nonlabored ventilation, respiratory function stable and patient connected to nasal cannula oxygen Cardiovascular status: stable and blood pressure returned to baseline Anesthetic complications: no    Last Vitals:  Filed Vitals:   09/21/15 1700 09/21/15 1800  BP: 112/71 114/63  Pulse: 96 97  Temp:    Resp: 23 16    Last Pain:  Filed Vitals:   09/21/15 1809  PainSc: Asleep                 Amare Kontos J

## 2015-09-21 NOTE — Anesthesia Procedure Notes (Signed)
Date/Time: 09/21/2015 2:12 PM Performed by: Lance Coon Pre-anesthesia Checklist: Patient identified, Timeout performed, Emergency Drugs available, Suction available and Patient being monitored Patient Re-evaluated:Patient Re-evaluated prior to inductionOxygen Delivery Method: Nasal cannula Comments: High flow 

## 2015-09-21 NOTE — Clinical Social Work Note (Signed)
CSW continuing to follow patient's progress, patient plans to discharge to [facility] place once she is medically ready for discharge.  Jones Broom. [place], MSW, [place] 09/21/2015 5:42 PM

## 2015-09-21 NOTE — Progress Notes (Addendum)
Patient ID: Jillian Bright, female   DOB: [birth year], 70 y.o.   MRN: [id number]    Advanced Heart Failure Rounding Note   Subjective:   70 year old female history of coronary artery disease, congestive heart failure, diabetes mellitus, oxygen dependent COPD, obstructive sleep apnea-intolerant to CPAP, pulmonary hypertension, obesity, hyperlipidemia. She was last seen by Dr. Aundra Dubin in August 2013. She initially presented with peripheral edema in 2009 and had a normal stress test and preserved EF on echocardiogram. She had a right heart catheterization which showed mild/moderate pulmonary hypertension which responded well to oxygen with decreased PA pressure. She had 2-D echocardiogram on 09/08/2015 revealing normal ejection fraction of 60-65% and wall motion. Grade 1 diastolic dysfunction. Moderate to severe aortic stenosis where previously was moderate. Mild MR. Left atrium mildly dilated. Peak PA pressure 61 mmHg. Right atrium moderately to severely dilated. Moderate tricuspid valve regurgitation.  Thoracentesis 4/25 with 600 cc bloody fluid out.   Doing better today.  Diuresed very well yesterday, weight down 9 lbs.  Co-ox 77%.  CVP remains 12.  Creatinine down to 1.58.    09/13/15 CTA: negative for PE, enlarging lung nodule noted.  Objective:   Weight Range:  Vital Signs:   Temp:  [97.7 F (36.5 C)-99.1 F (37.3 C)] 97.8 F (36.6 C) (04/26 0400) Pulse Rate:  [88-120] 98 (04/26 0600) Resp:  [12-33] 20 (04/26 0600) BP: (84-118)/(48-97) 115/76 mmHg (04/26 0600) SpO2:  [88 %-100 %] 100 % (04/26 0600) Weight:  [184 lb 15.5 oz (83.9 kg)] 184 lb 15.5 oz (83.9 kg) (04/26 0600) Last BM Date: 09/14/15  Weight change: Filed Weights   09/19/15 0500 09/20/15 0532 09/21/15 0600  Weight: 197 lb 12.8 oz (89.721 kg) 193 lb 14.4 oz (87.952 kg) 184 lb 15.5 oz (83.9 kg)    Intake/Output:   Intake/Output Summary (Last 24 hours) at 09/21/15 0749 Last data filed at 09/21/15 0645  Gross per 24  hour  Intake 1280.1 ml  Output   8150 ml  Net -6869.9 ml     Physical Exam: General: NAD. No resp difficulty. In bed.  HEENT: normal Neck: supple. JVP 12 cm. Carotids 2+ bilat; no bruits. No lymphadenopathy or thryomegaly appreciated. Cor: PMI nondisplaced. Regular rate & rhythm. No rubs, gallops.  3/6 crescendo-decrescendo murmur RUSB with muffled S2. Lungs: RML RLL decreased. On 3 liters oxygen Abdomen: soft, nontender, nondistended. No hepatosplenomegaly. No bruits or masses. Good bowel sounds. Extremities: no cyanosis, clubbing, rash.  1+ ankle edema, wearing unna boots. Neuro: alert & orientedx3, cranial nerves grossly intact. moves all 4 extremities w/o difficulty. Affect pleasant  Telemetry: ST 100s   Labs: Basic Metabolic Panel:  Recent Labs Lab 09/17/15 0445 09/18/15 0445 09/19/15 0432 09/20/15 0505 09/21/15 0355  NA 134* 130* 131* 132* 133*  K 3.8 3.8 4.2 4.3 2.8*  CL 81* 76* 73* 74* 68*  CO2 40* 43* 39* 45* >50*  GLUCOSE 201* 212* 254* 242* 206*  BUN 26* 28* 36* 46* 45*  CREATININE 0.99 1.12* 1.83* 1.91* 1.58*  CALCIUM 9.1 9.0 9.2 8.5* 9.1  MG  --  1.2*  --   --  1.4*    Liver Function Tests:  Recent Labs Lab 09/20/15 1657  PROT 6.5   No results for input(s): LIPASE, AMYLASE in the last 168 hours. No results for input(s): AMMONIA in the last 168 hours.  CBC:  Recent Labs Lab 09/17/15 0445 09/18/15 0445 09/19/15 0432 09/20/15 0505 09/21/15 0355  WBC 5.5 5.7 6.7 5.0 5.9  HGB 9.3*  8.8* 8.7* 8.3* 8.5*  HCT 34.6* 33.9* 34.0* 31.9* 32.4*  MCV 79.0 77.8* 78.7 78.4 77.9*  PLT 160 169 197 205 202    Cardiac Enzymes: No results for input(s): CKTOTAL, CKMB, CKMBINDEX, TROPONINI in the last 168 hours.  BNP: BNP (last 3 results)  Recent Labs  11/18/14 1015 09/07/15 2000  BNP 176.5* 848.6*    ProBNP (last 3 results) No results for input(s): PROBNP in the last 8760 hours.    Other results:  Imaging: Dg Chest Port 1 View  09/20/2015   CLINICAL DATA:  Pleural effusion, post thoracentesis EXAM: PORTABLE CHEST 1 VIEW COMPARISON:  09/19/2015 FINDINGS: Cardiomegaly again noted. Small residual right pleural effusion with right basilar atelectasis. Right arm PICC line with tip in SVC. There is no pneumothorax. No pulmonary edema. IMPRESSION: Small residual right pleural effusion right basilar atelectasis. No pneumothorax. No pulmonary edema. Stable right arm PICC line position. Electronically Signed   By: Lahoma Crocker M.D.   On: 09/20/2015 15:09     Medications:     Scheduled Medications: . acetaZOLAMIDE  500 mg Oral BID  . aspirin  81 mg Oral Daily  . atorvastatin  20 mg Oral Daily  . darifenacin  7.5 mg Oral Daily  . DULoxetine  60 mg Oral Daily  . fentaNYL  25 mcg Transdermal Q72H  . gabapentin  300 mg Oral TID  . insulin aspart  0-5 Units Subcutaneous QHS  . insulin aspart  0-9 Units Subcutaneous TID WC  . insulin aspart  3 Units Subcutaneous TID WC  . insulin glargine  5 Units Subcutaneous QHS  . magnesium sulfate 1 - 4 g bolus IVPB  2 g Intravenous Once  . metolazone  2.5 mg Oral Once  . mometasone-formoterol  2 puff Inhalation BID  . pantoprazole  40 mg Oral Daily  . polyethylene glycol  17 g Oral Daily  . potassium chloride  10 mEq Intravenous Q1 Hr x 4  . potassium chloride  40 mEq Oral Once  . potassium chloride  40 mEq Oral Once  . sodium chloride  250 mL Intravenous Once  . sodium chloride flush  3 mL Intravenous Q12H  . traZODone  50 mg Oral QHS    Infusions: . DOBUTamine 5 mcg/kg/min (09/21/15 0553)  . furosemide (LASIX) infusion 12 mg/hr (09/20/15 2000)    PRN Medications: sodium chloride, ALPRAZolam, ipratropium-albuterol, ondansetron (ZOFRAN) IV, oxyCODONE-acetaminophen, sodium chloride flush, sodium chloride flush   Assessment/Plan/Discussion     1. 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 Echo 4/17 with EF 60-65%, dilated and dysfunctional RV, PASP  61 mmHg, moderate to severe AS with mean gradient 34 and AVA 0.9 cm^2, moderate to severe TR. On exam, she is still volume overloaded though weight came down nicely with diuresis yesterday.  Now on dobutamine for RV support.  Co-ox 77% and CVP 12.   - Continue Lasix gtt due to elevated CVP. Will add dose of metolazone 2.5 x 1 again today.  Will need to follow creatinine closely.   - Unna boots in place.  - Possibly will do RHC/LHC today if we can fit her in.  2. Aortic stenosis: Possibly severe. Clearly progressed from prior echo (several years ago). She will need TEE to assess more closely. Treatment of the aortic valve will be tricky. Anesthesia for a definitive procedure will be quite risky given her pulmonary disease (COPD and OHS/OSA). I think that she will do better with TAVR than  with open AVR. There is concern that TAVR will not address the TR, however this may improve with diuresis and decreased RVEDP. Per pulmonary, high risk for anesthesia.   - Respiratory status better today, will try to arrange TEE today.   3. Pulmonary hypertension: She has a history of pulmonary hypertension thought to be due to OHS/OSA. Suspect her [place] currently is a combination of OHS/OSA and pulmonary venous hypertension from volume overload.  - Aim for RHC today.   4. OHS/OSA: She is on home O2 chronically. Had not been able to tolerate CPAP in the past but did fine with Bipap last night.  Will have her wear CPAP at night.  5. COPD: On home O2, no longer smokes.  6. CAD: h/o RCA PCI. Will need coronary angiography prior to any consideration of valve intervention.  She is doing better, may be able to do today if we can fit her in. Continue ASA 81 and statin.  7. Tricuspid regurgitation: Moderate to severe. Will reassess by TEE. This may improve with diuresis.  8. Pulmonary nodule: Enlarging pulmonary nodule concerning for bronchogenic carcinoma, especially with smoking history. This will have to be  addressed after her heart issues are resolved. Will need PET scan after cardiac issues stabilized.  - Thoracentesis on 4/25, bloody fluid sent for cytology. Fluid is exudative, ?related to malignancy.  9. AKI: Creatinine improved at 1.58 today.  Follow closely.  10. Alkalosis: Increase acetazolamide to 500 mg bid.    Loralie Champagne 09/21/2015 7:49 AM

## 2015-09-21 NOTE — Transfer of Care (Signed)
Immediate Anesthesia Transfer of Care Note  Patient: Jillian Bright  Procedure(s) Performed: Procedure(s): TRANSESOPHAGEAL ECHOCARDIOGRAM (TEE) (N/A)  Patient Location: Endoscopy Unit  Anesthesia Type:MAC  Level of Consciousness: awake, alert  and patient cooperative  Airway & Oxygen Therapy: Patient Spontanous Breathing and Patient connected to nasal cannula oxygen  Post-op Assessment: Report given to RN, Post -op Vital signs reviewed and stable and Patient moving all extremities X 4  Post vital signs: Reviewed and stable  Last Vitals:  Filed Vitals:   09/21/15 1300 09/21/15 1326  BP: 96/67 111/64  Pulse: 93 93  Temp:    Resp: 18 19    Last Pain:  Filed Vitals:   09/21/15 1327  PainSc: 8       Patients Stated Pain Goal: 0 (84/69/62 9528)  Complications: No apparent anesthesia complications

## 2015-09-21 NOTE — Progress Notes (Signed)
Name: BRIZZA NATHANSON MRN: [id number] DOB: 1945-07-28    ADMISSION DATE:  09/07/2015 CONSULTATION DATE:  4/19  REFERRING MD :  [facility]   CHIEF COMPLAINT:  Lung nodule   BRIEF PATIENT DESCRIPTION: 70yo morbidly obese female former smoker (quit 2009) with multiple medical problems including COPD, OSA (intolerant to CPAP), Pulmonary HTN (PA peak pressure 61 mmHg), dCHF, Severe AS, CAD, chronic respiratory failure on home O2 initially presented 4/12 with 3 week hx BLE edema, SOB, orthopnea.  She was admitted, diuresed and seen in consultation by CVTS for possible aortic valve repair/replacement.  CTA chest to r/o PE revealed enlarging LUL pulmonary nodule and PCCM consulted.   SIGNIFICANT EVENTS  4/12 admitted for SOB 4/19 PCCM consulted for enlarging LUL nodule.  4/24 could not lay flat cath, dobutamine, lasix drip 4/25- thor 600 bloody tap 4/26- neg 6.8 liters!!, improved resp status  STUDIES:  CTA chest 4/19>>>Negative for pulmonary embolus.  Left upper lobe pulmonary nodule has enlarged since the prior CT scan and could be a bronchogenic carcinoma. PET CT scan is recommended for further evaluation.  Moderate right pleural effusion with associated compressive atelectasis. 2D echo 4/13>>> 82-42%, grade 1 diastolic dysfunction, severe AS, mild MR, severely dilated RA, PA peak pressure 21mHg  HISTORY OF PRESENT ILLNESS:  69yo morbidly obese female former smoker (quit 2009) with multiple medical problems including COPD, OSA (intolerant to CPAP), Pulmonary HTN (PA peak pressure 61 mmHg), dCHF, Severe AS, CAD, chronic respiratory failure on home O2 initially presented 4/12 with 3 week hx BLE edema, SOB, orthopnea.  She was admitted, diuresed and seen in consultation by CVTS for possible aortic valve repair/replacement.  CTA chest to r/o PE revealed enlarging LUL pulmonary nodule and PCCM consulted.    SUBJECTIVE:  In chair , no distress Less edema Mild pleuritic CP rt  VITAL SIGNS: Temp:   [97.7 F (36.5 C)-99.1 F (37.3 C)] 97.9 F (36.6 C) (04/26 0800) Pulse Rate:  [88-120] 90 (04/26 1100) Resp:  [9-26] 9 (04/26 1100) BP: (84-116)/(48-97) 103/69 mmHg (04/26 1100) SpO2:  [97 %-100 %] 98 % (04/26 1100) Weight:  [83.9 kg (184 lb 15.5 oz)] 83.9 kg (184 lb 15.5 oz) (04/26 0600)  PHYSICAL EXAMINATION: General:  Chronically ill appearing female, NAD , in bed, no distress Neuro:  Awake, alert, appropriate, MAE  HEENT:  Mm moist, no JVD  Cardiovascular:  s1s2 rrr Lungs:  CTa bases today, no wheezing Abdomen:  Round, soft, +bs  Musculoskeletal:  Warm and dry, Gr 2 BLE edema reduced, erythema     Recent Labs Lab 09/19/15 0432 09/20/15 0505 09/21/15 0355  NA 131* 132* 133*  K 4.2 4.3 2.8*  CL 73* 74* 68*  CO2 39* 45* >50*  BUN 36* 46* 45*  CREATININE 1.83* 1.91* 1.58*  GLUCOSE 254* 242* 206*    Recent Labs Lab 09/19/15 0432 09/20/15 0505 09/21/15 0355  HGB 8.7* 8.3* 8.5*  HCT 34.0* 31.9* 32.4*  WBC 6.7 5.0 5.9  PLT 197 205 202   Dg Chest Port 1 View  09/21/2015  CLINICAL DATA:  Right side rib pain, shortness of Breath EXAM: PORTABLE CHEST 1 VIEW COMPARISON:  09/20/2015 FINDINGS: Cardiomegaly again noted. There is small right pleural effusion right basilar atelectasis or infiltrate. Right arm PICC line with tip in SVC. Left lung is clear. IMPRESSION: Small right pleural effusion with right basilar atelectasis or infiltrate. Right arm PICC line in place. Electronically Signed   By: LLahoma CrockerM.D.   On: 09/21/2015 10:17  Dg Chest Port 1 View  09/20/2015  CLINICAL DATA:  Pleural effusion, post thoracentesis EXAM: PORTABLE CHEST 1 VIEW COMPARISON:  09/19/2015 FINDINGS: Cardiomegaly again noted. Small residual right pleural effusion with right basilar atelectasis. Right arm PICC line with tip in SVC. There is no pneumothorax. No pulmonary edema. IMPRESSION: Small residual right pleural effusion right basilar atelectasis. No pneumothorax. No pulmonary edema. Stable  right arm PICC line position. Electronically Signed   By: Lahoma Crocker M.D.   On: 09/20/2015 15:09   Dg Abd Portable 1v  09/21/2015  CLINICAL DATA:  Right lower quadrant pain EXAM: PORTABLE ABDOMEN - 1 VIEW COMPARISON:  CT scan 01/29/2014 FINDINGS: There is normal small bowel gas pattern. Moderate stool noted in right colon and hepatic flexure of the colon. Moderate stool noted in descending colon and rectosigmoid colon. Partially visualized left hip prosthesis. Degenerative changes lumbar spine. Mild levoscoliosis lumbar spine. Small right pleural effusion with right basilar atelectasis or infiltrate. IMPRESSION: Normal small bowel gas pattern. Moderate colonic stool as described above. Mild levoscoliosis and degenerative changes lumbar spine. Electronically Signed   By: Lahoma Crocker M.D.   On: 09/21/2015 10:19    ASSESSMENT / PLAN:  LUL pulmonary nodule (11.6m x 8.772m - slightly increased from previous CT scan in 2015 COPD. Severe with an FEV1 of 1.12 or 46% OSA - intolerant to CPAP -  OHS Secondary PAH R effusion with associated RLL collapse - hemorrhagic, unclear etiology, r.o malignant Pt with mod-severe AS, RV failure, acute on chronic diastolic HF exacerbation, CAD Acute on Chronic Hypoxemic Hypercapneic Resp Fx 2/2 above  REC -  Able to do thora 600 bloody, exudative Need to follow cytology closely may consider repeat CT chest as this rt base now is drained and can image closer Neg balance was successful to the tune of neg 6.8 liters and renal fxn did great, maintain Rt base on cxr is still open , but some rotation on film causing some haziness For TEE, repeat cath? Now likely can lay flat  Supp k Follow pcxr again in future for re accumulation  pleuritic CP, add pred low dose i updated pt and brother   DaLavon PaganiniFeTitus MouldMD, FAOrchidgr: [facility]

## 2015-09-21 NOTE — Progress Notes (Signed)
Cut down to 13L  And added sterile water to neb

## 2015-09-21 NOTE — Progress Notes (Signed)
  Echocardiogram Echocardiogram Transesophageal has been performed.  Darlina Sicilian M 09/21/2015, 3:28 PM

## 2015-09-21 NOTE — Progress Notes (Signed)
[facility] Progress Note  Patient: Jillian Bright DPO:[id number]   PCP: [name], MD DOB: 03/30/1946   DOA: 09/07/2015   DOS: 09/21/2015   Date of Service: the patient was seen and examined on 09/21/2015 Outpatient Specialists: Vineet sood pulmonary Loralie Champagne cardiology  Subjective: Patient has been complaining of some right-sided chest pain. She also does of some right upper quadrant pain. No other acute complaint at present. Nutrition: Tolerating oral diet  Brief hospital course: Patient was admitted on 09/07/2015, with complaint of worsening shortness of breath and orthopnea, was found to have acute on chronic diastolic CHF with significant volume overload. Patient was started on IV Lasix and was diuresed. Cardiology was consulted and the patient was started on dopamine after a PICC line insertion. Echogram 1 showed moderate to severe aortic stenosis and cardiac thoracic surgery was consulted. Later on pulmonary was consulted as well for suspected pulmonary nodule as well as pleural effusion. The patient underwent right thoracentesis with bloody pleural fluid 600 mL drained. On 09/21/2015 TEE showed moderate aortic stenosis.  Currently further plan is continue diuresis, identify the etiology of the right pleural effusion with a CT scan of the chest and further workup for aortic stenosis.  Assessment and Plan: 1. Acute on chronic diastolic (congestive) heart failure (HCC) Probably worsening due to noncompliance as an outpatient since the note has documented that the patient has not been taking Lasix prior to admission. Cardiology as well as pulmonary has been consulted on the patient. Patient status post right pleural effusion thoracentesis. Currently on dobutamine drip as well as Lasix infusion. Zaroxolyn intermittently. -28 L since admission, close to baseline weight as per documentation. Renal function stable. We'll follow cartilage recommendation regarding further  treatment of the acute and chronic diastolic dysfunction  2. COPD with chronic respiratory failure. Right-sided pleural effusion. Exudative Left upper pulmonary nodule. Pulmonary was consulted and is currently following. Getting another repeat CT scan today to identify right basal atelectasis. Appreciate input. Continue incentive spirometry. Follow culture as well as cytology of the pleural fluid.  3. Aortic stenosis. Moderate to severe based on TEE today. We'll follow recommendation from cardiology as well as chronic thoracic surgery.  4. Acute on chronic kidney injury. Chronic kidney disease stage II. Renal function mildly worsened during the hospitalization but improved after initiation of the dobutamine drip. Likely cardiorenal hemodynamics. We'll continue closely monitoring of the renal function.  4. Hypokalemia. Replacing. Hypomagnesemia. Replacing.  5. Right-sided chest pain as well as right upper quadrant pain. LFTs are normal. Likely related to the pleural effusion and atelectasis. Patient is getting a CT scan to further identify the etiology.  6. Microcytic anemia. Likely associated with iron deficiency. We will initiate iron supplementation. Further GI evaluation as an outpatient. Status post PRBC on April 16.  7. Type 2 diabetes mellitus. Hemoglobin A1c 7.3 on 09/08/2015. Holding home metformin in the hospital. Also glipizide has been on hold. We will increase the sliding scale to moderate while the patient will be on prednisone. Increase Lantus to 8 units.  8. Pain management. The patient at home is not on any chronic pain medication. In the hospital the patient has been started on fentanyl patch. I would reduce the level of 12.5 g today from 25 and would prefer oral when necessary Percocet for pain management as well as tramadol.  9. Metabolic alkalosis Likely from aggressive diuresis. Also has chronic respiratory alkalosis at her baseline due to her  COPD. Diamox has been added and will monitor  daily.  Activity: physical therapy recommends SNF. Social worker on the case. Bowel regimen: last BM 09/14/2015. Bowel regimen initiated. Diet: Cardiac diet carb modified DVT Prophylaxis: subcutaneous Heparin  Advance goals of care discussion: DNR/DNI  Family Communication: no family was present at bedside, at the time of interview.   Disposition:  Expected discharge date: 09/26/2015 Barriers to safe discharge: Further treatment for the aortic stenosis as well as CHF  Consultants: Pulmonary, cardiac thoracic surgery, cardiology Procedures: Right thoracentesis 09/20/2015 TEE 09/21/2015, echocardiogram April 13,   Antibiotics: Anti-infectives    None        Intake/Output Summary (Last 24 hours) at 09/21/15 1453 Last data filed at 09/21/15 1445  Gross per 24 hour  Intake 1394.1 ml  Output   8625 ml  Net -7230.9 ml   Filed Weights   09/19/15 0500 09/20/15 0532 09/21/15 0600  Weight: 89.721 kg (197 lb 12.8 oz) 87.952 kg (193 lb 14.4 oz) 83.9 kg (184 lb 15.5 oz)    Objective: Physical Exam: Filed Vitals:   09/21/15 1200 09/21/15 1300 09/21/15 1326 09/21/15 1446  BP: 94/62 96/67 111/64 94/67  Pulse: 92 93 93 94  Temp:      TempSrc:      Resp: '15 18 19 19  '$ Height:      Weight:      SpO2: 99% 98% 100% 100%     General: Appear in moderate distress, no Rash; Oral Mucosa moist. Cardiovascular: S1 and S2 Present, aortic systolic Murmur, difficult to assess JVD Respiratory: Bilateral Air entry present and bilateral Crackles, no wheezes Abdomen: Bowel Sound present, Soft and no tenderness Extremities: bilateral  Pedal edema, no calf tenderness Neurology: Grossly no focal neuro deficit.  Data Reviewed: CBC:  Recent Labs Lab 09/17/15 0445 09/18/15 0445 09/19/15 0432 09/20/15 0505 09/21/15 0355  WBC 5.5 5.7 6.7 5.0 5.9  HGB 9.3* 8.8* 8.7* 8.3* 8.5*  HCT 34.6* 33.9* 34.0* 31.9* 32.4*  MCV 79.0 77.8* 78.7 78.4 77.9*    PLT 160 169 197 205 631   Basic Metabolic Panel:  Recent Labs Lab 09/17/15 0445 09/18/15 0445 09/19/15 0432 09/20/15 0505 09/21/15 0355  NA 134* 130* 131* 132* 133*  K 3.8 3.8 4.2 4.3 2.8*  CL 81* 76* 73* 74* 68*  CO2 40* 43* 39* 45* >50*  GLUCOSE 201* 212* 254* 242* 206*  BUN 26* 28* 36* 46* 45*  CREATININE 0.99 1.12* 1.83* 1.91* 1.58*  CALCIUM 9.1 9.0 9.2 8.5* 9.1  MG  --  1.2*  --   --  1.4*   GFR: Estimated Creatinine Clearance: 35.6 mL/min (by C-G formula based on Cr of 1.58). Liver Function Tests:  Recent Labs Lab 09/20/15 1657 09/21/15 0355  AST  --  35  ALT  --  21  ALKPHOS  --  53  BILITOT  --  1.3*  PROT 6.5 7.1  ALBUMIN  --  3.4*   No results for input(s): LIPASE, AMYLASE in the last 168 hours. No results for input(s): AMMONIA in the last 168 hours. Coagulation Profile:  Recent Labs Lab 09/19/15 0432  INR 1.34   Cardiac Enzymes: No results for input(s): CKTOTAL, CKMB, CKMBINDEX, TROPONINI in the last 168 hours. BNP (last 3 results) No results for input(s): PROBNP in the last 8760 hours.  CBG:  Recent Labs Lab 09/20/15 1143 09/20/15 1610 09/20/15 2120 09/21/15 0738 09/21/15 1126  GLUCAP 176* 200* 256* 181* 105*   Lipid Profile:  Recent Labs  09/21/15 0355  CHOL 125   Urine  analysis:    Component Value Date/Time   COLORURINE YELLOW 09/13/2015 2230   APPEARANCEUR CLEAR 09/13/2015 2230   LABSPEC 1.031* 09/13/2015 2230   PHURINE 6.0 09/13/2015 2230   GLUCOSEU NEGATIVE 09/13/2015 2230   HGBUR NEGATIVE 09/13/2015 2230   HGBUR negative 08/04/2008 1526   BILIRUBINUR NEGATIVE 09/13/2015 2230   BILIRUBINUR n 11/24/2012 1335   KETONESUR NEGATIVE 09/13/2015 2230   PROTEINUR NEGATIVE 09/13/2015 2230   PROTEINUR trace 11/24/2012 1335   UROBILINOGEN 0.2 11/18/2014 0750   UROBILINOGEN 0.2 11/24/2012 1335   NITRITE NEGATIVE 09/13/2015 2230   NITRITE n 11/24/2012 1335   LEUKOCYTESUR NEGATIVE 09/13/2015 2230    Recent Results (from  the past 240 hour(s))  MRSA PCR Screening     Status: None   Collection Time: 09/14/15  8:52 PM  Result Value Ref Range Status   MRSA by PCR NEGATIVE NEGATIVE Final    Comment:        The GeneXpert MRSA Assay (FDA approved for NASAL specimens only), is one component of a comprehensive MRSA colonization surveillance program. It is not intended to diagnose MRSA infection nor to guide or monitor treatment for MRSA infections.   Body fluid culture     Status: None (Preliminary result)   Collection Time: 09/20/15  3:42 PM  Result Value Ref Range Status   Specimen Description PLEURAL RIGHT FLUID  Final   Special Requests Normal  Final   Gram Stain   Final    MODERATE WBC PRESENT,BOTH PMN AND MONONUCLEAR NO ORGANISMS SEEN    Culture NO GROWTH < 24 HOURS  Final   Report Status PENDING  Incomplete   Studies: Dg Chest Port 1 View  09/21/2015  CLINICAL DATA:  Right side rib pain, shortness of Breath EXAM: PORTABLE CHEST 1 VIEW COMPARISON:  09/20/2015 FINDINGS: Cardiomegaly again noted. There is small right pleural effusion right basilar atelectasis or infiltrate. Right arm PICC line with tip in SVC. Left lung is clear. IMPRESSION: Small right pleural effusion with right basilar atelectasis or infiltrate. Right arm PICC line in place. Electronically Signed   By: Lahoma Crocker M.D.   On: 09/21/2015 10:17   Dg Chest Port 1 View  09/20/2015  CLINICAL DATA:  Pleural effusion, post thoracentesis EXAM: PORTABLE CHEST 1 VIEW COMPARISON:  09/19/2015 FINDINGS: Cardiomegaly again noted. Small residual right pleural effusion with right basilar atelectasis. Right arm PICC line with tip in SVC. There is no pneumothorax. No pulmonary edema. IMPRESSION: Small residual right pleural effusion right basilar atelectasis. No pneumothorax. No pulmonary edema. Stable right arm PICC line position. Electronically Signed   By: Lahoma Crocker M.D.   On: 09/20/2015 15:09   Dg Abd Portable 1v  09/21/2015  CLINICAL DATA:  Right  lower quadrant pain EXAM: PORTABLE ABDOMEN - 1 VIEW COMPARISON:  CT scan 01/29/2014 FINDINGS: There is normal small bowel gas pattern. Moderate stool noted in right colon and hepatic flexure of the colon. Moderate stool noted in descending colon and rectosigmoid colon. Partially visualized left hip prosthesis. Degenerative changes lumbar spine. Mild levoscoliosis lumbar spine. Small right pleural effusion with right basilar atelectasis or infiltrate. IMPRESSION: Normal small bowel gas pattern. Moderate colonic stool as described above. Mild levoscoliosis and degenerative changes lumbar spine. Electronically Signed   By: Lahoma Crocker M.D.   On: 09/21/2015 10:19     Scheduled Meds: . acetaZOLAMIDE  500 mg Oral BID  . aspirin  81 mg Oral Daily  . atorvastatin  20 mg Oral Daily  . darifenacin  7.5 mg Oral Daily  . DULoxetine  60 mg Oral Daily  . [START ON 09/24/2015] fentaNYL  12.5 mcg Transdermal Q72H  . gabapentin  300 mg Oral TID  . insulin aspart  0-5 Units Subcutaneous QHS  . insulin aspart  0-9 Units Subcutaneous TID WC  . insulin aspart  3 Units Subcutaneous TID WC  . insulin glargine  5 Units Subcutaneous QHS  . mometasone-formoterol  2 puff Inhalation BID  . pantoprazole  40 mg Oral Daily  . polyethylene glycol  17 g Oral Daily  . potassium chloride  40 mEq Oral Once  . predniSONE  20 mg Oral Q breakfast  . senna-docusate  2 tablet Oral BID  . sodium chloride  250 mL Intravenous Once  . sodium chloride flush  3 mL Intravenous Q12H  . traZODone  50 mg Oral QHS   Continuous Infusions: . DOBUTamine 5 mcg/kg/min (09/21/15 0553)  . furosemide (LASIX) infusion 12 mg/hr (09/20/15 2000)   PRN Meds: sodium chloride, ALPRAZolam, ipratropium-albuterol, ondansetron (ZOFRAN) IV, oxyCODONE-acetaminophen, sodium chloride flush, sodium chloride flush  Time spent: 30 minutes  Author: Berle Mull, MD [facility] Pager: [phone] 09/21/2015 2:53 PM  If 7PM-7AM, please contact  night-coverage at [email or web address], password [facility]

## 2015-09-21 NOTE — Interval H&P Note (Signed)
History and Physical Interval Note:  09/21/2015 2:12 PM  Jillian Bright  has presented today for surgery, with the diagnosis of bacteremia  The various methods of treatment have been discussed with the patient and family. After consideration of risks, benefits and other options for treatment, the patient has consented to  Procedure(s): TRANSESOPHAGEAL ECHOCARDIOGRAM (TEE) (N/A) as a surgical intervention .  The patient's history has been reviewed, patient examined, no change in status, stable for surgery.  I have reviewed the patient's chart and labs.  Questions were answered to the patient's satisfaction.     Nathalya Wolanski [employer]

## 2015-09-21 NOTE — H&P (View-Only) (Signed)
Patient ID: KLA BILY, female   DOB: 1945-12-19, 70 y.o.   MRN: [id number]    Advanced Heart Failure Rounding Note   Subjective:   70 year old female history of coronary artery disease, congestive heart failure, diabetes mellitus, oxygen dependent COPD, obstructive sleep apnea-intolerant to CPAP, pulmonary hypertension, obesity, hyperlipidemia. She was last seen by Dr. Aundra Dubin in August 2013. She initially presented with peripheral edema in 2009 and had a normal stress test and preserved EF on echocardiogram. She had a right heart catheterization which showed mild/moderate pulmonary hypertension which responded well to oxygen with decreased PA pressure. She had 2-D echocardiogram on 09/08/2015 revealing normal ejection fraction of 60-65% and wall motion. Grade 1 diastolic dysfunction. Moderate to severe aortic stenosis where previously was moderate. Mild MR. Left atrium mildly dilated. Peak PA pressure 61 mmHg. Right atrium moderately to severely dilated. Moderate tricuspid valve regurgitation.  Thoracentesis 4/25 with 600 cc bloody fluid out.   Doing better today.  Diuresed very well yesterday, weight down 9 lbs.  Co-ox 77%.  CVP remains 12.  Creatinine down to 1.58.    09/13/15 CTA: negative for PE, enlarging lung nodule noted.  Objective:   Weight Range:  Vital Signs:   Temp:  [97.7 F (36.5 C)-99.1 F (37.3 C)] 97.8 F (36.6 C) (04/26 0400) Pulse Rate:  [88-120] 98 (04/26 0600) Resp:  [12-33] 20 (04/26 0600) BP: (84-118)/(48-97) 115/76 mmHg (04/26 0600) SpO2:  [88 %-100 %] 100 % (04/26 0600) Weight:  [184 lb 15.5 oz (83.9 kg)] 184 lb 15.5 oz (83.9 kg) (04/26 0600) Last BM Date: 09/14/15  Weight change: Filed Weights   09/19/15 0500 09/20/15 0532 09/21/15 0600  Weight: 197 lb 12.8 oz (89.721 kg) 193 lb 14.4 oz (87.952 kg) 184 lb 15.5 oz (83.9 kg)    Intake/Output:   Intake/Output Summary (Last 24 hours) at 09/21/15 0749 Last data filed at 09/21/15 0645  Gross per 24  hour  Intake 1280.1 ml  Output   8150 ml  Net -6869.9 ml     Physical Exam: General: NAD. No resp difficulty. In bed.  HEENT: normal Neck: supple. JVP 12 cm. Carotids 2+ bilat; no bruits. No lymphadenopathy or thryomegaly appreciated. Cor: PMI nondisplaced. Regular rate & rhythm. No rubs, gallops.  3/6 crescendo-decrescendo murmur RUSB with muffled S2. Lungs: RML RLL decreased. On 3 liters oxygen Abdomen: soft, nontender, nondistended. No hepatosplenomegaly. No bruits or masses. Good bowel sounds. Extremities: no cyanosis, clubbing, rash.  1+ ankle edema, wearing unna boots. Neuro: alert & orientedx3, cranial nerves grossly intact. moves all 4 extremities w/o difficulty. Affect pleasant  Telemetry: ST 100s   Labs: Basic Metabolic Panel:  Recent Labs Lab 09/17/15 0445 09/18/15 0445 09/19/15 0432 09/20/15 0505 09/21/15 0355  NA 134* 130* 131* 132* 133*  K 3.8 3.8 4.2 4.3 2.8*  CL 81* 76* 73* 74* 68*  CO2 40* 43* 39* 45* >50*  GLUCOSE 201* 212* 254* 242* 206*  BUN 26* 28* 36* 46* 45*  CREATININE 0.99 1.12* 1.83* 1.91* 1.58*  CALCIUM 9.1 9.0 9.2 8.5* 9.1  MG  --  1.2*  --   --  1.4*    Liver Function Tests:  Recent Labs Lab 09/20/15 1657  PROT 6.5   No results for input(s): LIPASE, AMYLASE in the last 168 hours. No results for input(s): AMMONIA in the last 168 hours.  CBC:  Recent Labs Lab 09/17/15 0445 09/18/15 0445 09/19/15 0432 09/20/15 0505 09/21/15 0355  WBC 5.5 5.7 6.7 5.0 5.9  HGB 9.3*  8.8* 8.7* 8.3* 8.5*  HCT 34.6* 33.9* 34.0* 31.9* 32.4*  MCV 79.0 77.8* 78.7 78.4 77.9*  PLT 160 169 197 205 202    Cardiac Enzymes: No results for input(s): CKTOTAL, CKMB, CKMBINDEX, TROPONINI in the last 168 hours.  BNP: BNP (last 3 results)  Recent Labs  11/18/14 1015 09/07/15 2000  BNP 176.5* 848.6*    ProBNP (last 3 results) No results for input(s): PROBNP in the last 8760 hours.    Other results:  Imaging: Dg Chest Port 1 View  09/20/2015   CLINICAL DATA:  Pleural effusion, post thoracentesis EXAM: PORTABLE CHEST 1 VIEW COMPARISON:  09/19/2015 FINDINGS: Cardiomegaly again noted. Small residual right pleural effusion with right basilar atelectasis. Right arm PICC line with tip in SVC. There is no pneumothorax. No pulmonary edema. IMPRESSION: Small residual right pleural effusion right basilar atelectasis. No pneumothorax. No pulmonary edema. Stable right arm PICC line position. Electronically Signed   By: Lahoma Crocker M.D.   On: 09/20/2015 15:09     Medications:     Scheduled Medications: . acetaZOLAMIDE  500 mg Oral BID  . aspirin  81 mg Oral Daily  . atorvastatin  20 mg Oral Daily  . darifenacin  7.5 mg Oral Daily  . DULoxetine  60 mg Oral Daily  . fentaNYL  25 mcg Transdermal Q72H  . gabapentin  300 mg Oral TID  . insulin aspart  0-5 Units Subcutaneous QHS  . insulin aspart  0-9 Units Subcutaneous TID WC  . insulin aspart  3 Units Subcutaneous TID WC  . insulin glargine  5 Units Subcutaneous QHS  . magnesium sulfate 1 - 4 g bolus IVPB  2 g Intravenous Once  . metolazone  2.5 mg Oral Once  . mometasone-formoterol  2 puff Inhalation BID  . pantoprazole  40 mg Oral Daily  . polyethylene glycol  17 g Oral Daily  . potassium chloride  10 mEq Intravenous Q1 Hr x 4  . potassium chloride  40 mEq Oral Once  . potassium chloride  40 mEq Oral Once  . sodium chloride  250 mL Intravenous Once  . sodium chloride flush  3 mL Intravenous Q12H  . traZODone  50 mg Oral QHS    Infusions: . DOBUTamine 5 mcg/kg/min (09/21/15 0553)  . furosemide (LASIX) infusion 12 mg/hr (09/20/15 2000)    PRN Medications: sodium chloride, ALPRAZolam, ipratropium-albuterol, ondansetron (ZOFRAN) IV, oxyCODONE-acetaminophen, sodium chloride flush, sodium chloride flush   Assessment/Plan/Discussion     1. 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 Echo 4/17 with EF 60-65%, dilated and dysfunctional RV, PASP  61 mmHg, moderate to severe AS with mean gradient 34 and AVA 0.9 cm^2, moderate to severe TR. On exam, she is still volume overloaded though weight came down nicely with diuresis yesterday.  Now on dobutamine for RV support.  Co-ox 77% and CVP 12.   - Continue Lasix gtt due to elevated CVP. Will add dose of metolazone 2.5 x 1 again today.  Will need to follow creatinine closely.   - Unna boots in place.  - Possibly will do RHC/LHC today if we can fit her in.  2. Aortic stenosis: Possibly severe. Clearly progressed from prior echo (several years ago). She will need TEE to assess more closely. Treatment of the aortic valve will be tricky. Anesthesia for a definitive procedure will be quite risky given her pulmonary disease (COPD and OHS/OSA). I think that she will do better with TAVR than  with open AVR. There is concern that TAVR will not address the TR, however this may improve with diuresis and decreased RVEDP. Per pulmonary, high risk for anesthesia.   - Respiratory status better today, will try to arrange TEE today.   3. Pulmonary hypertension: She has a history of pulmonary hypertension thought to be due to OHS/OSA. Suspect her [place] currently is a combination of OHS/OSA and pulmonary venous hypertension from volume overload.  - Aim for RHC today.   4. OHS/OSA: She is on home O2 chronically. Had not been able to tolerate CPAP in the past but did fine with Bipap last night.  Will have her wear CPAP at night.  5. COPD: On home O2, no longer smokes.  6. CAD: h/o RCA PCI. Will need coronary angiography prior to any consideration of valve intervention.  She is doing better, may be able to do today if we can fit her in. Continue ASA 81 and statin.  7. Tricuspid regurgitation: Moderate to severe. Will reassess by TEE. This may improve with diuresis.  8. Pulmonary nodule: Enlarging pulmonary nodule concerning for bronchogenic carcinoma, especially with smoking history. This will have to be  addressed after her heart issues are resolved. Will need PET scan after cardiac issues stabilized.  - Thoracentesis on 4/25, bloody fluid sent for cytology. Fluid is exudative, ?related to malignancy.  9. AKI: Creatinine improved at 1.58 today.  Follow closely.  10. Alkalosis: Increase acetazolamide to 500 mg bid.    Loralie Champagne 09/21/2015 7:49 AM

## 2015-09-22 ENCOUNTER — Encounter (HOSPITAL_COMMUNITY): Payer: Self-pay | Admitting: Cardiology

## 2015-09-22 DIAGNOSIS — G9340 Encephalopathy, unspecified: Secondary | ICD-10-CM | POA: Diagnosis not present

## 2015-09-22 DIAGNOSIS — J9621 Acute and chronic respiratory failure with hypoxia: Secondary | ICD-10-CM

## 2015-09-22 DIAGNOSIS — J9620 Acute and chronic respiratory failure, unspecified whether with hypoxia or hypercapnia: Secondary | ICD-10-CM | POA: Diagnosis present

## 2015-09-22 LAB — CARBOXYHEMOGLOBIN
CARBOXYHEMOGLOBIN: 2.1 % — AB (ref 0.5–1.5)
Methemoglobin: 0.8 % (ref 0.0–1.5)
O2 SAT: 67.6 %
TOTAL HEMOGLOBIN: 10.8 g/dL — AB (ref 12.0–16.0)

## 2015-09-22 LAB — COMPREHENSIVE METABOLIC PANEL
ALBUMIN: 3.9 g/dL (ref 3.5–5.0)
ALT: 29 U/L (ref 14–54)
AST: 42 U/L — AB (ref 15–41)
Alkaline Phosphatase: 68 U/L (ref 38–126)
BILIRUBIN TOTAL: 1.3 mg/dL — AB (ref 0.3–1.2)
BUN: 43 mg/dL — AB (ref 6–20)
CHLORIDE: 66 mmol/L — AB (ref 101–111)
CREATININE: 1.45 mg/dL — AB (ref 0.44–1.00)
Calcium: 9.8 mg/dL (ref 8.9–10.3)
GFR calc Af Amer: 41 mL/min — ABNORMAL LOW (ref >60)
GFR calc non Af Amer: 35 mL/min — ABNORMAL LOW (ref >60)
Glucose, Bld: 226 mg/dL — ABNORMAL HIGH (ref 65–99)
POTASSIUM: 3.5 mmol/L (ref 3.5–5.1)
Sodium: 134 mmol/L — ABNORMAL LOW (ref 135–145)
Total Protein: 8 g/dL (ref 6.5–8.1)

## 2015-09-22 LAB — CBC WITH DIFFERENTIAL/PLATELET
BASOS ABS: 0 10*3/uL (ref 0.0–0.1)
BASOS PCT: 0 %
EOS ABS: 0 10*3/uL (ref 0.0–0.7)
EOS PCT: 0 %
HCT: 37.4 % (ref 36.0–46.0)
Hemoglobin: 9.9 g/dL — ABNORMAL LOW (ref 12.0–15.0)
LYMPHS PCT: 5 %
Lymphs Abs: 0.4 10*3/uL — ABNORMAL LOW (ref 0.7–4.0)
MCH: 20.6 pg — ABNORMAL LOW (ref 26.0–34.0)
MCHC: 26.5 g/dL — AB (ref 30.0–36.0)
MCV: 77.8 fL — ABNORMAL LOW (ref 78.0–100.0)
MONO ABS: 0.4 10*3/uL (ref 0.1–1.0)
MONOS PCT: 4 %
NEUTROS ABS: 7.1 10*3/uL (ref 1.7–7.7)
NEUTROS PCT: 91 %
PLATELETS: 241 10*3/uL (ref 150–400)
RBC: 4.81 MIL/uL (ref 3.87–5.11)
RDW: 20.6 % — AB (ref 11.5–15.5)
WBC: 7.9 10*3/uL (ref 4.0–10.5)

## 2015-09-22 LAB — BLOOD GAS, ARTERIAL
Acid-Base Excess: 32 mmol/L — ABNORMAL HIGH (ref 0.0–2.0)
BICARBONATE: 59.3 meq/L — AB (ref 20.0–24.0)
DRAWN BY: 313941
O2 CONTENT: 15 L/min
O2 SAT: 96.2 %
PH ART: 7.419 (ref 7.350–7.450)
Patient temperature: 98.1
TCO2: 62.2 mmol/L (ref 0–100)
pCO2 arterial: 93 mmHg (ref 35.0–45.0)
pO2, Arterial: 83.3 mmHg (ref 80.0–100.0)

## 2015-09-22 LAB — GLUCOSE, CAPILLARY
GLUCOSE-CAPILLARY: 189 mg/dL — AB (ref 65–99)
GLUCOSE-CAPILLARY: 209 mg/dL — AB (ref 65–99)
GLUCOSE-CAPILLARY: 154 mg/dL — AB (ref 65–99)
Glucose-Capillary: 216 mg/dL — ABNORMAL HIGH (ref 65–99)
Glucose-Capillary: 185 mg/dL — ABNORMAL HIGH (ref 65–99)

## 2015-09-22 LAB — CHOLESTEROL, BODY FLUID: Cholesterol, Fluid: 56 mg/dL

## 2015-09-22 LAB — MAGNESIUM: MAGNESIUM: 2.1 mg/dL (ref 1.7–2.4)

## 2015-09-22 MED ORDER — FERROUS SULFATE 325 (65 FE) MG PO TABS
325.0000 mg | ORAL_TABLET | Freq: Two times a day (BID) | ORAL | Status: DC
  Administered 2015-09-22 – 2015-09-28 (×12): 325 mg via ORAL
  Filled 2015-09-22 (×12): qty 1

## 2015-09-22 MED ORDER — VITAMIN B-12 1000 MCG PO TABS
1000.0000 ug | ORAL_TABLET | Freq: Every day | ORAL | Status: DC
  Administered 2015-09-22 – 2015-09-28 (×7): 1000 ug via ORAL
  Filled 2015-09-22 (×7): qty 1

## 2015-09-22 MED ORDER — ACETAZOLAMIDE 250 MG PO TABS
250.0000 mg | ORAL_TABLET | Freq: Two times a day (BID) | ORAL | Status: DC
  Administered 2015-09-22 – 2015-09-25 (×7): 250 mg via ORAL
  Filled 2015-09-22 (×9): qty 1

## 2015-09-22 MED ORDER — TORSEMIDE 20 MG PO TABS
40.0000 mg | ORAL_TABLET | Freq: Every day | ORAL | Status: DC
  Administered 2015-09-22 – 2015-09-25 (×4): 40 mg via ORAL
  Filled 2015-09-22 (×4): qty 2

## 2015-09-22 MED ORDER — ATROPINE SULFATE 1 MG/10ML IJ SOSY
PREFILLED_SYRINGE | INTRAMUSCULAR | Status: AC
  Filled 2015-09-22: qty 10

## 2015-09-22 MED ORDER — LIDOCAINE 5 % EX PTCH
1.0000 | MEDICATED_PATCH | CUTANEOUS | Status: DC
  Administered 2015-09-22 – 2015-09-26 (×5): 1 via TRANSDERMAL
  Filled 2015-09-22 (×6): qty 1

## 2015-09-22 MED ORDER — DOBUTAMINE IN D5W 4-5 MG/ML-% IV SOLN
2.5000 ug/kg/min | INTRAVENOUS | Status: DC
  Administered 2015-09-22: 2.5 ug/kg/min via INTRAVENOUS
  Filled 2015-09-22: qty 250

## 2015-09-22 MED ORDER — GUAIFENESIN ER 600 MG PO TB12
600.0000 mg | ORAL_TABLET | Freq: Two times a day (BID) | ORAL | Status: DC
  Administered 2015-09-22 – 2015-09-28 (×13): 600 mg via ORAL
  Filled 2015-09-22 (×13): qty 1

## 2015-09-22 NOTE — Progress Notes (Signed)
[facility] Progress Note  Patient: Jillian Bright:[id number]   PCP: [name], MD DOB: 1945-11-13   DOA: 09/07/2015   DOS: 09/22/2015   Date of Service: the patient was seen and examined on 09/22/2015 Outpatient Specialists: Vineet sood pulmonary Loralie Champagne cardiology  Subjective: Patient continues to exhibit some confusion. Also complains of right-sided chest and abdominal pain with similar story of fall that she told me yesterday. No acute event identified overnight Nutrition: Tolerating oral diet  Brief hospital course: Patient was admitted on 09/07/2015, with complaint of worsening shortness of breath and orthopnea, was found to have acute on chronic diastolic CHF with significant volume overload. Patient was started on IV Lasix and was diuresed. Cardiology was consulted and the patient was started on dopamine after a PICC line insertion. Echogram 1 showed moderate to severe aortic stenosis and cardiac thoracic surgery was consulted. Later on pulmonary was consulted as well for suspected pulmonary nodule as well as pleural effusion. The patient underwent right thoracentesis with bloody pleural fluid 600 mL drained. On 09/21/2015 TEE showed moderate aortic stenosis.  Currently further plan is continue diuresis, identify the etiology of the right pleural effusion and further workup for aortic stenosis.  Assessment and Plan: 1. Acute on chronic diastolic (congestive) heart failure (HCC) Probably worsening due to noncompliance as an outpatient since the note has documented that the patient has not been taking Lasix prior to admission. Cardiology as well as pulmonary has been consulted on the patient. Patient status post right pleural effusion thoracentesis. Currently on dobutamine drip as well as Lasix infusion. Zaroxolyn intermittently. -30 L since admission, better than baseline weight as per documentation. Renal function stable. We'll follow cardiology  recommendation-current plan is to stop furosemide infusion and switch the patient to oral torsemide along with continuous dobutamine drip with a reduced rate.  2. COPD with chronic respiratory failure. Right-sided pleural effusion. Exudative Left upper pulmonary nodule. Pulmonary was consulted and is currently following. Repeat CT scan shows evidence of atelectasis versus possible infiltrate although the patient is not exhibiting any signs of infection. Appreciate input. Continue incentive spirometry. Follow culture as well as cytology of the pleural fluid. ABG today shows mixed respiratory acidosis and metabolic alkalosis, BiPAP when necessary ordered.  3. Aortic stenosis. Moderate to severe based on TEE today. We'll follow recommendation from cardiology as well as chronic thoracic surgery.  4. Acute on chronic kidney injury. Chronic kidney disease stage II. Renal function mildly worsened during the hospitalization but improved after initiation of the dobutamine drip. Likely cardiorenal hemodynamics. We'll continue closely monitoring of the renal function.  4. Hypokalemia. Replacing. Hypomagnesemia. Replacing.  5. Right-sided chest pain as well as right upper quadrant pain. LFTs are normal. Likely related to the pleural effusion and atelectasis. CT scan is not showing any acute abnormality on the upper abdomen.  6. Microcytic anemia. Likely associated with iron deficiency. We will initiate iron supplementation. Further GI evaluation as an outpatient. Status post PRBC on April 16.  7. Type 2 diabetes mellitus. Hemoglobin A1c 7.3 on 09/08/2015. Holding home metformin in the hospital. Also glipizide has been on hold. We will increase the sliding scale to moderate while the patient will be on prednisone. Increase Lantus to 8 units.  8. Pain management. Acute encephalopathy The patient at home is not on any chronic pain medication. In the hospital the patient has been  started on fentanyl patch. Given her encephalopathy I would discontinue the fentanyl patch and would prefer oral when necessary Percocet for  pain management as well as tramadol.  9. Metabolic alkalosis Likely from aggressive diuresis. Also has chronic respiratory alkalosis at her baseline due to her COPD. Diamox has been added and will monitor daily.  Activity: physical therapy recommends SNF. Social worker on the case. Bowel regimen: last BM 09/14/2015. Bowel regimen initiated. Diet: Cardiac diet carb modified DVT Prophylaxis: subcutaneous Heparin  Advance goals of care discussion: DNR/DNI  Family Communication: no family was present at bedside, at the time of interview.   Disposition:  Expected discharge date: 09/26/2015 Barriers to safe discharge: Further treatment for the aortic stenosis as well as CHF  Consultants: Pulmonary, cardiac thoracic surgery, cardiology Procedures: Right thoracentesis 09/20/2015 TEE 09/21/2015, echocardiogram April 13,   Antibiotics: Anti-infectives    None        Intake/Output Summary (Last 24 hours) at 09/22/15 1720 Last data filed at 09/22/15 1600  Gross per 24 hour  Intake 1047.94 ml  Output   2400 ml  Net -1352.06 ml   Filed Weights   09/20/15 0532 09/21/15 0600 09/22/15 0500  Weight: 87.952 kg (193 lb 14.4 oz) 83.9 kg (184 lb 15.5 oz) 78.7 kg (173 lb 8 oz)    Objective: Physical Exam: Filed Vitals:   09/22/15 1000 09/22/15 1124 09/22/15 1600 09/22/15 1642  BP: 114/86 116/70 97/86   Pulse: 96 97 97   Temp:  98.1 F (36.7 C)  97.7 F (36.5 C)  TempSrc:  Oral  Oral  Resp: '12 14 18   '$ Height:      Weight:      SpO2: 98% 96% 96%    General: Appear in Mild distress, no Rash; Oral Mucosa moist. Cardiovascular: S1 and S2 Present, aortic systolic Murmur, difficult to assess JVD Respiratory: Bilateral Air entry present and bilateral Crackles, no wheezes Abdomen: Bowel Sound present, Soft and no tenderness Extremities: bilateral   Pedal edema, no calf tenderness Neurology: Disoriented, no focal deficit  Data Reviewed: CBC:  Recent Labs Lab 09/18/15 0445 09/19/15 0432 09/20/15 0505 09/21/15 0355 09/22/15 0405  WBC 5.7 6.7 5.0 5.9 7.9  NEUTROABS  --   --   --   --  7.1  HGB 8.8* 8.7* 8.3* 8.5* 9.9*  HCT 33.9* 34.0* 31.9* 32.4* 37.4  MCV 77.8* 78.7 78.4 77.9* 77.8*  PLT 169 197 205 202 619   Basic Metabolic Panel:  Recent Labs Lab 09/18/15 0445 09/19/15 0432 09/20/15 0505 09/21/15 0355 09/21/15 1813 09/22/15 0405  NA 130* 131* 132* 133* 134* 134*  K 3.8 4.2 4.3 2.8* 3.4* 3.5  CL 76* 73* 74* 68* 69* 66*  CO2 43* 39* 45* >50* 50* >50*  GLUCOSE 212* 254* 242* 206* 123* 226*  BUN 28* 36* 46* 45* 42* 43*  CREATININE 1.12* 1.83* 1.91* 1.58* 1.33* 1.45*  CALCIUM 9.0 9.2 8.5* 9.1 9.5 9.8  MG 1.2*  --   --  1.4*  --  2.1   GFR: Estimated Creatinine Clearance: 37.7 mL/min (by C-G formula based on Cr of 1.45). Liver Function Tests:  Recent Labs Lab 09/20/15 1657 09/21/15 0355 09/22/15 0405  AST  --  35 42*  ALT  --  21 29  ALKPHOS  --  53 68  BILITOT  --  1.3* 1.3*  PROT 6.5 7.1 8.0  ALBUMIN  --  3.4* 3.9   No results for input(s): LIPASE, AMYLASE in the last 168 hours. No results for input(s): AMMONIA in the last 168 hours. Coagulation Profile:  Recent Labs Lab 09/19/15 0432  INR 1.34  Cardiac Enzymes: No results for input(s): CKTOTAL, CKMB, CKMBINDEX, TROPONINI in the last 168 hours. BNP (last 3 results) No results for input(s): PROBNP in the last 8760 hours.  CBG:  Recent Labs Lab 09/21/15 1751 09/21/15 2117 09/22/15 0754 09/22/15 1129 09/22/15 1330  GLUCAP 96 189* 189* 216* 185*   Lipid Profile:  Recent Labs  09/21/15 0355  CHOL 125   Urine analysis:    Component Value Date/Time   COLORURINE YELLOW 09/13/2015 2230   APPEARANCEUR CLEAR 09/13/2015 2230   LABSPEC 1.031* 09/13/2015 2230   PHURINE 6.0 09/13/2015 2230   GLUCOSEU NEGATIVE 09/13/2015 2230   HGBUR  NEGATIVE 09/13/2015 2230   HGBUR negative 08/04/2008 1526   BILIRUBINUR NEGATIVE 09/13/2015 2230   BILIRUBINUR n 11/24/2012 1335   KETONESUR NEGATIVE 09/13/2015 2230   PROTEINUR NEGATIVE 09/13/2015 2230   PROTEINUR trace 11/24/2012 1335   UROBILINOGEN 0.2 11/18/2014 0750   UROBILINOGEN 0.2 11/24/2012 1335   NITRITE NEGATIVE 09/13/2015 2230   NITRITE n 11/24/2012 1335   LEUKOCYTESUR NEGATIVE 09/13/2015 2230    Recent Results (from the past 240 hour(s))  MRSA PCR Screening     Status: None   Collection Time: 09/14/15  8:52 PM  Result Value Ref Range Status   MRSA by PCR NEGATIVE NEGATIVE Final    Comment:        The GeneXpert MRSA Assay (FDA approved for NASAL specimens only), is one component of a comprehensive MRSA colonization surveillance program. It is not intended to diagnose MRSA infection nor to guide or monitor treatment for MRSA infections.   Body fluid culture     Status: None (Preliminary result)   Collection Time: 09/20/15  3:42 PM  Result Value Ref Range Status   Specimen Description PLEURAL RIGHT FLUID  Final   Special Requests Normal  Final   Gram Stain   Final    MODERATE WBC PRESENT,BOTH PMN AND MONONUCLEAR NO ORGANISMS SEEN    Culture NO GROWTH 2 DAYS  Final   Report Status PENDING  Incomplete   Studies: Ct Chest Wo Contrast  09/21/2015  CLINICAL DATA:  Followup of pleural effusion. COPD. Shortness of breath. Pulmonary hypertension. Pneumonia. Bronchitis. EXAM: CT CHEST WITHOUT CONTRAST TECHNIQUE: Multidetector CT imaging of the chest was performed following the standard protocol without IV contrast. COMPARISON:  09/13/2015 PE study.  Chest radiograph of 09/21/2015. FINDINGS: Mediastinum/Nodes: A right-sided PICC line which terminates at the mid to low SVC. Aortic and branch vessel atherosclerosis. Moderate cardiomegaly. Multivessel coronary artery atherosclerosis. Aortic valvular calcifications. Pulmonary artery enlargement, including a 3.5 cm outflow  tract. Mediastinal adenopathy, including a similar 11 mm precarinal node. Hilar regions poorly evaluated without intravenous contrast. Lungs/Pleura: Decrease in small right pleural effusion since the prior PET. Mild motion degradation.  Mild centrilobular emphysema. Persistent right base airspace disease, minimally progressive in the superior segment. Left upper lobe 1.2 cm nodule on image 26/series 9, similar to the most recent CT. Worsened left-sided aeration with primarily dependent airspace disease. Upper abdomen: Normal imaged portions of the liver, spleen, stomach, adrenal glands, gallbladder, and left kidney. Musculoskeletal: lucent lesion within the T3 vertebral body was present back in 2015 and can be considered benign. Likely a hemangioma. IMPRESSION: 1. Motion degraded exam. 2. Decrease in small right pleural effusion since the prior CT. Slight worsening right base airspace disease, most consistent with infection or aspiration. 3. Patchy left base airspace disease is primarily felt to represent atelectasis. Concurrent infection cannot be excluded, especially laterally. 4. Cardiomegaly. Atherosclerosis, including within the  coronary arteries. 5. Pulmonary artery enlargement suggests pulmonary arterial hypertension. 6. Aortic valvular calcifications could represent valvular disease. Echocardiography could be informative. 7. A left upper lobe pulmonary nodule is similar to on the most recent exam, and remains suspicious for primary bronchogenic carcinoma. Mild thoracic adenopathy which is indeterminate and could be reactive or metastatic. Recommend attention on follow-up. Electronically Signed   By: Abigail Miyamoto M.D.   On: 09/21/2015 18:48     Scheduled Meds: . acetaZOLAMIDE  250 mg Oral BID  . aspirin  81 mg Oral Daily  . atorvastatin  20 mg Oral Daily  . darifenacin  7.5 mg Oral Daily  . DULoxetine  60 mg Oral Daily  . ferrous sulfate  325 mg Oral BID WC  . gabapentin  300 mg Oral TID  .  guaiFENesin  600 mg Oral BID  . insulin aspart  0-5 Units Subcutaneous QHS  . insulin aspart  0-9 Units Subcutaneous TID WC  . insulin aspart  3 Units Subcutaneous TID WC  . insulin glargine  5 Units Subcutaneous QHS  . lidocaine  1 patch Transdermal Q24H  . mometasone-formoterol  2 puff Inhalation BID  . pantoprazole  40 mg Oral Daily  . polyethylene glycol  17 g Oral Daily  . predniSONE  20 mg Oral Q breakfast  . senna-docusate  2 tablet Oral BID  . sodium chloride  250 mL Intravenous Once  . sodium chloride flush  3 mL Intravenous Q12H  . torsemide  40 mg Oral Daily  . traZODone  50 mg Oral QHS  . vitamin B-12  1,000 mcg Oral Daily   Continuous Infusions: . DOBUTamine 2.5 mcg/kg/min (09/22/15 0846)   PRN Meds: sodium chloride, ALPRAZolam, ipratropium-albuterol, ondansetron (ZOFRAN) IV, oxyCODONE-acetaminophen, sodium chloride flush, sodium chloride flush  Time spent: 30 minutes  Author: Berle Mull, MD [facility] Pager: [phone] 09/22/2015 5:20 PM  If 7PM-7AM, please contact night-coverage at [email or web address], password [facility]

## 2015-09-22 NOTE — Progress Notes (Signed)
Pt attempted to pull out peripheral IV. Very aggitated trying to climb out of bed.   PRN xanax given. Will continue to monitor closely.

## 2015-09-22 NOTE — Progress Notes (Addendum)
Patient ID: Jillian Bright, female   DOB: September 24, 1945, 70 y.o.   MRN: [id number]    Advanced Heart Failure Rounding Note   Subjective:   70 year old female history of coronary artery disease, congestive heart failure, diabetes mellitus, oxygen dependent COPD, obstructive sleep apnea-intolerant to CPAP, pulmonary hypertension, obesity, hyperlipidemia. She was last seen by Dr. Aundra Dubin in August 2013. She initially presented with peripheral edema in 2009 and had a normal stress test and preserved EF on echocardiogram. She had a right heart catheterization which showed mild/moderate pulmonary hypertension which responded well to oxygen with decreased PA pressure. She had 2-D echocardiogram on 09/08/2015 revealing normal ejection fraction of 60-65% and wall motion. Grade 1 diastolic dysfunction. Moderate to severe aortic stenosis where previously was moderate. Mild MR. Left atrium mildly dilated. Peak PA pressure 61 mmHg. Right atrium moderately to severely dilated. Moderate tricuspid valve regurgitation.  Thoracentesis 4/25 with 600 cc bloody fluid out.   TEE (4/26) with normal LV size with D-shaped septum suggesting RV pressure/volume overload. LV EF 45-50%, diffuse hypokinesis. Severely dilated RV with mild to moderate systolic dysfunction. Mild left atrial enlargement, no LA appendage thrombus. Moderate to severe right atrial enlargement. There was moderate to severe tricuspid regurgitation. Mild to moderate mitral regurgitation. The aortic valve was trileaflet with severe calcification. There was moderate to severe aortic stenosis with mean gradient 37 mmHg and AVA 1.0 cm^2.   09/13/15 CTA: negative for PE, enlarging lung nodule noted.  She was confused overnight, has sitter now.  Knows who I am and where she is, relatively clear this morning.  Breathing much improved. Remains on 10 L oxygen by HFNC (chronically on a lot of oxygen at home).  Weight down another 10 lbs, creatinine down to  1.45, good diuresis.    Objective:   Weight Range:  Vital Signs:   Temp:  [97.1 F (36.2 C)-99.1 F (37.3 C)] 98.1 F (36.7 C) (04/27 0752) Pulse Rate:  [83-110] 96 (04/27 0700) Resp:  [9-28] 13 (04/27 0700) BP: (94-142)/(49-125) 112/70 mmHg (04/27 0700) SpO2:  [93 %-100 %] 100 % (04/27 0700) Weight:  [173 lb 8 oz (78.7 kg)] 173 lb 8 oz (78.7 kg) (04/27 0500) Last BM Date: 09/14/15  Weight change: Filed Weights   09/20/15 0532 09/21/15 0600 09/22/15 0500  Weight: 193 lb 14.4 oz (87.952 kg) 184 lb 15.5 oz (83.9 kg) 173 lb 8 oz (78.7 kg)    Intake/Output:   Intake/Output Summary (Last 24 hours) at 09/22/15 0824 Last data filed at 09/22/15 0700  Gross per 24 hour  Intake 1110.1 ml  Output   4575 ml  Net -3464.9 ml     Physical Exam: General: NAD. No resp difficulty. In bed.  HEENT: normal Neck: supple. JVP 7 cm. Carotids 2+ bilat; no bruits. No lymphadenopathy or thryomegaly appreciated. Cor: PMI nondisplaced. Regular rate & rhythm. No rubs, gallops.  3/6 crescendo-decrescendo murmur RUSB with muffled S2. Lungs: RML RLL decreased. On 3 liters oxygen Abdomen: soft, nontender, nondistended. No hepatosplenomegaly. No bruits or masses. Good bowel sounds. Extremities: no cyanosis, clubbing, rash.  Edema resolved, wearing unna boots. Neuro: alert & orientedx3, cranial nerves grossly intact. moves all 4 extremities w/o difficulty. Affect pleasant  Telemetry: ST 100s   Labs: Basic Metabolic Panel:  Recent Labs Lab 09/18/15 0445 09/19/15 0432 09/20/15 0505 09/21/15 0355 09/21/15 1813 09/22/15 0405  NA 130* 131* 132* 133* 134* 134*  K 3.8 4.2 4.3 2.8* 3.4* 3.5  CL 76* 73* 74* 68* 69* 66*  CO2  43* 39* 45* >50* 50* >50*  GLUCOSE 212* 254* 242* 206* 123* 226*  BUN 28* 36* 46* 45* 42* 43*  CREATININE 1.12* 1.83* 1.91* 1.58* 1.33* 1.45*  CALCIUM 9.0 9.2 8.5* 9.1 9.5 9.8  MG 1.2*  --   --  1.4*  --  2.1    Liver Function Tests:  Recent Labs Lab 09/20/15 1657  09/21/15 0355 09/22/15 0405  AST  --  35 42*  ALT  --  21 29  ALKPHOS  --  53 68  BILITOT  --  1.3* 1.3*  PROT 6.5 7.1 8.0  ALBUMIN  --  3.4* 3.9   No results for input(s): LIPASE, AMYLASE in the last 168 hours. No results for input(s): AMMONIA in the last 168 hours.  CBC:  Recent Labs Lab 09/18/15 0445 09/19/15 0432 09/20/15 0505 09/21/15 0355 09/22/15 0405  WBC 5.7 6.7 5.0 5.9 7.9  NEUTROABS  --   --   --   --  7.1  HGB 8.8* 8.7* 8.3* 8.5* 9.9*  HCT 33.9* 34.0* 31.9* 32.4* 37.4  MCV 77.8* 78.7 78.4 77.9* 77.8*  PLT 169 197 205 202 241    Cardiac Enzymes: No results for input(s): CKTOTAL, CKMB, CKMBINDEX, TROPONINI in the last 168 hours.  BNP: BNP (last 3 results)  Recent Labs  11/18/14 1015 09/07/15 2000  BNP 176.5* 848.6*    ProBNP (last 3 results) No results for input(s): PROBNP in the last 8760 hours.    Other results:  Imaging: Ct Chest Wo Contrast  09/21/2015  CLINICAL DATA:  Followup of pleural effusion. COPD. Shortness of breath. Pulmonary hypertension. Pneumonia. Bronchitis. EXAM: CT CHEST WITHOUT CONTRAST TECHNIQUE: Multidetector CT imaging of the chest was performed following the standard protocol without IV contrast. COMPARISON:  09/13/2015 PE study.  Chest radiograph of 09/21/2015. FINDINGS: Mediastinum/Nodes: A right-sided PICC line which terminates at the mid to low SVC. Aortic and branch vessel atherosclerosis. Moderate cardiomegaly. Multivessel coronary artery atherosclerosis. Aortic valvular calcifications. Pulmonary artery enlargement, including a 3.5 cm outflow tract. Mediastinal adenopathy, including a similar 11 mm precarinal node. Hilar regions poorly evaluated without intravenous contrast. Lungs/Pleura: Decrease in small right pleural effusion since the prior PET. Mild motion degradation.  Mild centrilobular emphysema. Persistent right base airspace disease, minimally progressive in the superior segment. Left upper lobe 1.2 cm nodule on  image 26/series 9, similar to the most recent CT. Worsened left-sided aeration with primarily dependent airspace disease. Upper abdomen: Normal imaged portions of the liver, spleen, stomach, adrenal glands, gallbladder, and left kidney. Musculoskeletal: lucent lesion within the T3 vertebral body was present back in 2015 and can be considered benign. Likely a hemangioma. IMPRESSION: 1. Motion degraded exam. 2. Decrease in small right pleural effusion since the prior CT. Slight worsening right base airspace disease, most consistent with infection or aspiration. 3. Patchy left base airspace disease is primarily felt to represent atelectasis. Concurrent infection cannot be excluded, especially laterally. 4. Cardiomegaly. Atherosclerosis, including within the coronary arteries. 5. Pulmonary artery enlargement suggests pulmonary arterial hypertension. 6. Aortic valvular calcifications could represent valvular disease. Echocardiography could be informative. 7. A left upper lobe pulmonary nodule is similar to on the most recent exam, and remains suspicious for primary bronchogenic carcinoma. Mild thoracic adenopathy which is indeterminate and could be reactive or metastatic. Recommend attention on follow-up. Electronically Signed   By: Abigail Miyamoto M.D.   On: 09/21/2015 18:48   Dg Chest Port 1 View  09/21/2015  CLINICAL DATA:  Right side rib pain, shortness  of Breath EXAM: PORTABLE CHEST 1 VIEW COMPARISON:  09/20/2015 FINDINGS: Cardiomegaly again noted. There is small right pleural effusion right basilar atelectasis or infiltrate. Right arm PICC line with tip in SVC. Left lung is clear. IMPRESSION: Small right pleural effusion with right basilar atelectasis or infiltrate. Right arm PICC line in place. Electronically Signed   By: Lahoma Crocker M.D.   On: 09/21/2015 10:17   Dg Chest Port 1 View  09/20/2015  CLINICAL DATA:  Pleural effusion, post thoracentesis EXAM: PORTABLE CHEST 1 VIEW COMPARISON:  09/19/2015 FINDINGS:  Cardiomegaly again noted. Small residual right pleural effusion with right basilar atelectasis. Right arm PICC line with tip in SVC. There is no pneumothorax. No pulmonary edema. IMPRESSION: Small residual right pleural effusion right basilar atelectasis. No pneumothorax. No pulmonary edema. Stable right arm PICC line position. Electronically Signed   By: Lahoma Crocker M.D.   On: 09/20/2015 15:09   Dg Abd Portable 1v  09/21/2015  CLINICAL DATA:  Right lower quadrant pain EXAM: PORTABLE ABDOMEN - 1 VIEW COMPARISON:  CT scan 01/29/2014 FINDINGS: There is normal small bowel gas pattern. Moderate stool noted in right colon and hepatic flexure of the colon. Moderate stool noted in descending colon and rectosigmoid colon. Partially visualized left hip prosthesis. Degenerative changes lumbar spine. Mild levoscoliosis lumbar spine. Small right pleural effusion with right basilar atelectasis or infiltrate. IMPRESSION: Normal small bowel gas pattern. Moderate colonic stool as described above. Mild levoscoliosis and degenerative changes lumbar spine. Electronically Signed   By: Lahoma Crocker M.D.   On: 09/21/2015 10:19     Medications:     Scheduled Medications: . acetaZOLAMIDE  500 mg Oral BID  . aspirin  81 mg Oral Daily  . atorvastatin  20 mg Oral Daily  . darifenacin  7.5 mg Oral Daily  . DULoxetine  60 mg Oral Daily  . ferrous sulfate  325 mg Oral BID WC  . gabapentin  300 mg Oral TID  . guaiFENesin  600 mg Oral BID  . insulin aspart  0-5 Units Subcutaneous QHS  . insulin aspart  0-9 Units Subcutaneous TID WC  . insulin aspart  3 Units Subcutaneous TID WC  . insulin glargine  5 Units Subcutaneous QHS  . lidocaine  1 patch Transdermal Q24H  . mometasone-formoterol  2 puff Inhalation BID  . pantoprazole  40 mg Oral Daily  . polyethylene glycol  17 g Oral Daily  . predniSONE  20 mg Oral Q breakfast  . senna-docusate  2 tablet Oral BID  . sodium chloride  250 mL Intravenous Once  . sodium chloride  flush  3 mL Intravenous Q12H  . traZODone  50 mg Oral QHS  . vitamin B-12  1,000 mcg Oral Daily    Infusions: . DOBUTamine 5 mcg/kg/min (09/21/15 2000)    PRN Medications: sodium chloride, ALPRAZolam, ipratropium-albuterol, ondansetron (ZOFRAN) IV, oxyCODONE-acetaminophen, sodium chloride flush, sodium chloride flush   Assessment/Plan/Discussion     1. 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 TEE 4/27 with EF 45-50%, D-shaped interventricular septum, severely dilated RV with mild to moderate systolic dysfunction, moderate to severe AS.  She was started on dobutamine for RV support.  Volume status is now considerably improved.  CVP down to 3-4 this morning, co-ox 68%.   - Stop Lasix gtt today, start po torsemide this evening.   - Decrease dobutamine to 2.5 mcg/kg/min. 2. Aortic stenosis: TEE yesterday, AS is moderate to severe with mean gradient 37  and AVA 1.0 cm^2. Question here is going to be whether she will get enough benefit from TAVR to warrant the risk for doing the procedure. Anesthesia will be quite risky given her pulmonary disease (COPD and OHS/OSA). TAVR will not fix her right heart failure, which I think is related primarily to her pulmonary disease. - I will review her situation with Drs Burt Knack and Roxy Manns.   - If we proceed with TAVR evaluation, she will need right and left heart cath.  3. Pulmonary hypertension: She has a history of pulmonary hypertension thought to be due to OHS/OSA. Suspect her [place] currently is a combination of OHS/OSA and pulmonary venous hypertension from volume overload.  - Volume overload resolved.  If we proceed with TAVR evaluation, will need RHC/LHC.    4. OHS/OSA: She is on home O2 chronically. Had not been able to tolerate CPAP in the past but did fine with Bipap last night.  Will have her wear CPAP at night.  5. COPD: On home O2, no longer smokes.  6. CAD: h/o RCA PCI. Will need coronary angiography  prior to any consideration of valve intervention.  Continue ASA 81 and statin.  7. Tricuspid regurgitation: Moderate to severe in setting of severe RV dilation.  8. Pulmonary nodule: Enlarging pulmonary nodule concerning for bronchogenic carcinoma, especially with smoking history. This will have to be addressed after her heart issues are resolved. Will need PET scan after cardiac issues stabilized (must be done as outpatient).  - Thoracentesis on 4/25, bloody fluid sent for cytology, awaiting results. Fluid is exudative, ?related to malignancy.  9. AKI: Creatinine improved at 1.45 today.  Follow closely.  10. Alkalosis: Continue acetazolamide today at 250 mg bid.   11. Delirium: Confusion at times, worse at night.   Loralie Champagne 09/22/2015 8:24 AM

## 2015-09-22 NOTE — Progress Notes (Signed)
CRITICAL VALUE ALERT  Critical value received:  0910  Date of notification:  09/22/15  Time of notification:  0915  Critical value read back:Yes.    Nurse who received alert:  Lattie Haw  MD notified (1st page):  Posey Pronto  Time of first page:  0930  MD notified (2nd page):  Time of second page:  Responding MD:  Posey Pronto  Time MD responded:  [phone]

## 2015-09-22 NOTE — Progress Notes (Signed)
Foley removed per pt. She appears confused on assessment, oriented to self and time but disoriented to place and situation.   Pt refused to have another foley placed and stated "I will slap the shit out of you if you try."  Depends/briefs provided by family and will monitor closely to prevent moisture associated damage to skin.   Paulene Floor, RN

## 2015-09-22 NOTE — Progress Notes (Signed)
Physical Therapy Treatment Patient Details Name: Jillian Bright MRN: [id number] DOB: 10/20/1945 Today's Date: 09/22/2015    History of Present Illness Jillian Bright is a 70 y.o. female has a past medical history of Diabetes mellitus; COPD (chronic obstructive pulmonary disease); Sleep apnea, obstructive; Pulmonary HTN ; Obesity; Hyperlipidemia; Myocardial infarction; Coronary artery disease; Anxiety; Shortness of breath; GERD (gastroesophageal reflux disease); Arthritis; CHF (congestive heart failure); Pneumonia; Bronchitis; and History of hiatal hernia. Admitted for Acute on chronic diastolic CHF. CT scan negative for PE, + Left upper lobe pulmonary nodule; decr respiratory status with move to stepdown unit 4/19. RLL effusion with thoracentesis 4/25    PT Comments    Patient more confused, yet from a cardiopulmonary status she tolerated activity far better.   Follow Up Recommendations  SNF;Supervision/Assistance - 24 hour     Equipment Recommendations  None recommended by PT    Recommendations for Other Services OT consult     Precautions / Restrictions Precautions Precautions: Fall Precaution Comments: monitor O2 an BP Restrictions Weight Bearing Restrictions: No    Mobility  Bed Mobility Overal bed mobility: Needs Assistance Bed Mobility: Supine to Sit     Supine to sit: Min assist        Transfers Overall transfer level: Needs assistance Equipment used: 2 person hand held assist Transfers: Sit to/from [facility] Sit to Stand: Min assist;+2 physical assistance;+2 safety/equipment Stand pivot transfers: Mod assist;+2 physical assistance;+2 safety/equipment       General transfer comment: pt stood with very little assistance (using forearm/shelf technique), however she had no undestanding of where the chair was and was moving opposite direction requiring incr assist to re-direct her and get her to sit  Ambulation/Gait Ambulation/Gait  assistance: Min assist Ambulation Distance (Feet): 5 Feet Assistive device: 2 person hand held assist Gait Pattern/deviations: Step-to pattern;Decreased stride length     General Gait Details: Ambulation very limited due to confusion (and was actually not intended due to concern for orthostasis--in the end she did not get orthostatic   Stairs            Wheelchair Mobility    Modified Rankin (Stroke Patients Only)       Balance   Sitting-balance support: No upper extremity supported;Feet supported Sitting balance-Leahy Scale: Fair     Standing balance support: Bilateral upper extremity supported Standing balance-Leahy Scale: Poor                      Cognition Arousal/Alertness: Awake/alert Behavior During Therapy: Restless Overall Cognitive Status: Impaired/Different from baseline Area of Impairment: Memory;Safety/judgement;Following commands;Awareness;Problem solving;Orientation;Attention Orientation Level: Place;Time;Situation Current Attention Level: Sustained (minimized distractions) Memory: Decreased short-term memory Following Commands: Follows one step commands inconsistently;Follows one step commands with increased time (easily distracted) Safety/Judgement: Decreased awareness of deficits;Decreased awareness of safety   Problem Solving: Slow processing;Requires verbal cues;Requires tactile cues;Decreased initiation;Difficulty sequencing General Comments: very restless/fidgeting with sheet    Exercises      General Comments        Pertinent Vitals/Pain SaO2 97% on 15L via HFNC at rest; at lowest down to 94% BP with initial drop supine to sit, however recovered and continued to incr with standing (supine 107/72, sit 94/57, stand 119/72)  Pain Assessment: Faces Faces Pain Scale: No hurt    Home Living                      Prior Function  PT Goals (current goals can now be found in the care plan section) Acute Rehab  PT Goals Patient Stated Goal: unable to state due to confusion PT Goal Formulation: Patient unable to participate in goal setting Time For Goal Achievement: 10/06/15 Potential to Achieve Goals: Fair Progress towards PT goals: Not progressing toward goals - comment (incr confusion)    Frequency  Min 2X/week    PT Plan Current plan remains appropriate    Co-evaluation             End of Session Equipment Utilized During Treatment: Oxygen;Gait belt Activity Tolerance: Treatment limited secondary to medical complications (Comment) (confusion and difficulty following instructions) Patient left: in chair;with call bell/phone within reach;with nursing/sitter in room;with family/visitor present     Time: 1132-1200 PT Time Calculation (min) (ACUTE ONLY): 28 min  Charges:  $Therapeutic Activity: 23-37 mins                    G Codes:      Jillian Bright 10-09-15, 1:44 PM Pager [phone]

## 2015-09-22 NOTE — Care Management Important Message (Signed)
Important Message  Patient Details  Name: Jillian Bright MRN: [id number] Date of Birth: 11/28/1945   Medicare Important Message Given:  Yes    Selby Foisy P Taylor 09/22/2015, 3:00 PM

## 2015-09-23 DIAGNOSIS — E538 Deficiency of other specified B group vitamins: Secondary | ICD-10-CM | POA: Diagnosis present

## 2015-09-23 DIAGNOSIS — J9622 Acute and chronic respiratory failure with hypercapnia: Secondary | ICD-10-CM

## 2015-09-23 LAB — CBC
HCT: 37.2 % (ref 36.0–46.0)
HEMOGLOBIN: 10.1 g/dL — AB (ref 12.0–15.0)
MCH: 21 pg — AB (ref 26.0–34.0)
MCHC: 27.2 g/dL — ABNORMAL LOW (ref 30.0–36.0)
MCV: 77.5 fL — ABNORMAL LOW (ref 78.0–100.0)
PLATELETS: 254 10*3/uL (ref 150–400)
RBC: 4.8 MIL/uL (ref 3.87–5.11)
RDW: 20.2 % — ABNORMAL HIGH (ref 11.5–15.5)
WBC: 8.3 10*3/uL (ref 4.0–10.5)

## 2015-09-23 LAB — BASIC METABOLIC PANEL
Anion gap: 14 (ref 5–15)
BUN: 53 mg/dL — ABNORMAL HIGH (ref 6–20)
BUN: 53 mg/dL — AB (ref 6–20)
CALCIUM: 9.8 mg/dL (ref 8.9–10.3)
CHLORIDE: 67 mmol/L — AB (ref 101–111)
CO2: 46 mmol/L — ABNORMAL HIGH (ref 22–32)
CREATININE: 1.44 mg/dL — AB (ref 0.44–1.00)
Calcium: 9.9 mg/dL (ref 8.9–10.3)
Chloride: 67 mmol/L — ABNORMAL LOW (ref 101–111)
Creatinine, Ser: 1.42 mg/dL — ABNORMAL HIGH (ref 0.44–1.00)
GFR calc Af Amer: 41 mL/min — ABNORMAL LOW (ref >60)
GFR calc non Af Amer: 36 mL/min — ABNORMAL LOW (ref >60)
GFR, EST AFRICAN AMERICAN: 42 mL/min — AB (ref >60)
GFR, EST NON AFRICAN AMERICAN: 36 mL/min — AB (ref >60)
GLUCOSE: 144 mg/dL — AB (ref 65–99)
Glucose, Bld: 202 mg/dL — ABNORMAL HIGH (ref 65–99)
POTASSIUM: 2.4 mmol/L — AB (ref 3.5–5.1)
Potassium: 5.6 mmol/L — ABNORMAL HIGH (ref 3.5–5.1)
SODIUM: 132 mmol/L — AB (ref 135–145)
SODIUM: 127 mmol/L — AB (ref 135–145)

## 2015-09-23 LAB — GLUCOSE, CAPILLARY
Glucose-Capillary: 137 mg/dL — ABNORMAL HIGH (ref 65–99)
Glucose-Capillary: 215 mg/dL — ABNORMAL HIGH (ref 65–99)
Glucose-Capillary: 229 mg/dL — ABNORMAL HIGH (ref 65–99)
Glucose-Capillary: 166 mg/dL — ABNORMAL HIGH (ref 65–99)

## 2015-09-23 LAB — BASIC METABOLIC PANEL WITH GFR
Anion gap: 15 (ref 5–15)
BUN: 64 mg/dL — ABNORMAL HIGH (ref 6–20)
CO2: 46 mmol/L — ABNORMAL HIGH (ref 22–32)
Calcium: 9.9 mg/dL (ref 8.9–10.3)
Chloride: 69 mmol/L — ABNORMAL LOW (ref 101–111)
Creatinine, Ser: 1.62 mg/dL — ABNORMAL HIGH (ref 0.44–1.00)
GFR calc Af Amer: 36 mL/min — ABNORMAL LOW
GFR calc non Af Amer: 31 mL/min — ABNORMAL LOW
Glucose, Bld: 157 mg/dL — ABNORMAL HIGH (ref 65–99)
Potassium: 3.4 mmol/L — ABNORMAL LOW (ref 3.5–5.1)
Sodium: 130 mmol/L — ABNORMAL LOW (ref 135–145)

## 2015-09-23 LAB — CARBOXYHEMOGLOBIN
CARBOXYHEMOGLOBIN: 2.1 % — AB (ref 0.5–1.5)
METHEMOGLOBIN: 0.7 % (ref 0.0–1.5)
O2 SAT: 67.5 %
Total hemoglobin: 10.6 g/dL — ABNORMAL LOW (ref 12.0–16.0)

## 2015-09-23 LAB — MAGNESIUM: MAGNESIUM: 1.9 mg/dL (ref 1.7–2.4)

## 2015-09-23 MED ORDER — ALPRAZOLAM 0.5 MG PO TABS: 0.5000 mg | ORAL_TABLET | Freq: Two times a day (BID) | ORAL | Status: DC | PRN

## 2015-09-23 MED ORDER — POTASSIUM CHLORIDE CRYS ER 20 MEQ PO TBCR: 40.0000 meq | EXTENDED_RELEASE_TABLET | Freq: Two times a day (BID) | ORAL | Status: DC

## 2015-09-23 MED ORDER — POTASSIUM CHLORIDE CRYS ER 20 MEQ PO TBCR
40.0000 meq | EXTENDED_RELEASE_TABLET | Freq: Two times a day (BID) | ORAL | Status: DC
  Administered 2015-09-23: 40 meq via ORAL
  Filled 2015-09-23: qty 2

## 2015-09-23 MED ORDER — POTASSIUM CHLORIDE CRYS ER 20 MEQ PO TBCR
40.0000 meq | EXTENDED_RELEASE_TABLET | Freq: Once | ORAL | Status: AC
  Administered 2015-09-23: 40 meq via ORAL
  Filled 2015-09-23: qty 2

## 2015-09-23 MED ORDER — POTASSIUM CHLORIDE 10 MEQ/100ML IV SOLN
10.0000 meq | INTRAVENOUS | Status: AC
  Administered 2015-09-23 (×4): 10 meq via INTRAVENOUS
  Filled 2015-09-23 (×4): qty 100

## 2015-09-23 MED ORDER — TRAZODONE HCL 50 MG PO TABS: 50.0000 mg | ORAL_TABLET | Freq: Every evening | ORAL | Status: DC | PRN

## 2015-09-23 MED ORDER — GABAPENTIN 100 MG PO CAPS
200.0000 mg | ORAL_CAPSULE | Freq: Three times a day (TID) | ORAL | Status: DC
  Administered 2015-09-23 – 2015-09-24 (×6): 200 mg via ORAL
  Filled 2015-09-23 (×6): qty 2

## 2015-09-23 NOTE — Progress Notes (Signed)
[facility] Progress Note Patient Name: [name] DOB: 1945-12-20 MRN: [id number]   Date of Service  09/23/2015  HPI/Events of Note  Hypokalemia  eICU Interventions  Potassium replaced     Intervention Category Minor Interventions: Electrolytes abnormality - evaluation and management  Malessa Zartman 09/23/2015, 9:58 PM

## 2015-09-23 NOTE — Progress Notes (Signed)
CRITICAL VALUE ALERT  Critical value received:  K 2.4  Date of notification:  09/23/15  Time of notification:  0510   Critical value read back:Yes.    Nurse who received alert:  Carolanne Grumbling, RN  MD notified (1st page):  Rogue Bussing  Time of first page:  [phone]  4 runs of potassium ordered to infuse into the central line

## 2015-09-23 NOTE — Progress Notes (Signed)
Patient ID: Jillian Bright, female   DOB: 03/26/1946, 70 y.o.   MRN: [id number]    Advanced Heart Failure Rounding Note   Subjective:   70 year old female history of coronary artery disease, congestive heart failure, diabetes mellitus, oxygen dependent COPD, obstructive sleep apnea-intolerant to CPAP, pulmonary hypertension, obesity, hyperlipidemia. She was last seen by Dr. Aundra Dubin in August 2013. She initially presented with peripheral edema in 2009 and had a normal stress test and preserved EF on echocardiogram. She had a right heart catheterization which showed mild/moderate pulmonary hypertension which responded well to oxygen with decreased PA pressure. She had 2-D echocardiogram on 09/08/2015 revealing normal ejection fraction of 60-65% and wall motion. Grade 1 diastolic dysfunction. Moderate to severe aortic stenosis where previously was moderate. Mild MR. Left atrium mildly dilated. Peak PA pressure 61 mmHg. Right atrium moderately to severely dilated. Moderate tricuspid valve regurgitation.  Thoracentesis 4/25 with 600 cc bloody fluid out.   TEE (4/26) with normal LV size with D-shaped septum suggesting RV pressure/volume overload. LV EF 45-50%, diffuse hypokinesis. Severely dilated RV with mild to moderate systolic dysfunction. Mild left atrial enlargement, no LA appendage thrombus. Moderate to severe right atrial enlargement. There was moderate to severe tricuspid regurgitation. Mild to moderate mitral regurgitation. The aortic valve was trileaflet with severe calcification. There was moderate to severe aortic stenosis with mean gradient 37 mmHg and AVA 1.0 cm^2.   09/13/15 CTA: negative for PE, enlarging lung nodule noted.  Continued episodes of confusion overnight.  Relatively clear this morning.  Breathing much improved. Remains on 10 L oxygen by HFNC (chronically on a lot of oxygen at home).  CVP now down to 5.    Objective:   Weight Range:  Vital Signs:   Temp:  [97.6  F (36.4 C)-98.4 F (36.9 C)] 98.4 F (36.9 C) (04/28 0737) Pulse Rate:  [68-128] 98 (04/28 0737) Resp:  [8-22] 16 (04/28 0737) BP: (94-136)/(32-97) 123/73 mmHg (04/28 0737) SpO2:  [91 %-100 %] 97 % (04/28 0737) Weight:  [174 lb 2.6 oz (79 kg)] 174 lb 2.6 oz (79 kg) (04/28 0300) Last BM Date: 09/14/15  Weight change: Filed Weights   09/21/15 0600 09/22/15 0500 09/23/15 0300  Weight: 184 lb 15.5 oz (83.9 kg) 173 lb 8 oz (78.7 kg) 174 lb 2.6 oz (79 kg)    Intake/Output:   Intake/Output Summary (Last 24 hours) at 09/23/15 0752 Last data filed at 09/23/15 0600  Gross per 24 hour  Intake 548.14 ml  Output      0 ml  Net 548.14 ml     Physical Exam: General: NAD. No resp difficulty. In bed.  HEENT: normal Neck: supple. JVP 7 cm. Carotids 2+ bilat; no bruits. No lymphadenopathy or thryomegaly appreciated. Cor: PMI nondisplaced. Regular rate & rhythm. No rubs, gallops.  3/6 crescendo-decrescendo murmur RUSB with muffled S2. Lungs: RML RLL decreased. On 3 liters oxygen Abdomen: soft, nontender, nondistended. No hepatosplenomegaly. No bruits or masses. Good bowel sounds. Extremities: no cyanosis, clubbing, rash.  Edema resolved, wearing unna boots. Neuro: alert & orientedx3, cranial nerves grossly intact. moves all 4 extremities w/o difficulty. Affect pleasant  Telemetry: ST 100s   Labs: Basic Metabolic Panel:  Recent Labs Lab 09/18/15 0445  09/20/15 0505 09/21/15 0355 09/21/15 1813 09/22/15 0405 09/23/15 0432  NA 130*  < > 132* 133* 134* 134* 132*  K 3.8  < > 4.3 2.8* 3.4* 3.5 2.4*  CL 76*  < > 74* 68* 69* 66* 67*  CO2 43*  < >  45* >50* 50* >50* >50*  GLUCOSE 212*  < > 242* 206* 123* 226* 144*  BUN 28*  < > 46* 45* 42* 43* 53*  CREATININE 1.12*  < > 1.91* 1.58* 1.33* 1.45* 1.42*  CALCIUM 9.0  < > 8.5* 9.1 9.5 9.8 9.9  MG 1.2*  --   --  1.4*  --  2.1 1.9  < > = values in this interval not displayed.  Liver Function Tests:  Recent Labs Lab 09/20/15 1657  09/21/15 0355 09/22/15 0405  AST  --  35 42*  ALT  --  21 29  ALKPHOS  --  53 68  BILITOT  --  1.3* 1.3*  PROT 6.5 7.1 8.0  ALBUMIN  --  3.4* 3.9   No results for input(s): LIPASE, AMYLASE in the last 168 hours. No results for input(s): AMMONIA in the last 168 hours.  CBC:  Recent Labs Lab 09/19/15 0432 09/20/15 0505 09/21/15 0355 09/22/15 0405 09/23/15 0432  WBC 6.7 5.0 5.9 7.9 8.3  NEUTROABS  --   --   --  7.1  --   HGB 8.7* 8.3* 8.5* 9.9* 10.1*  HCT 34.0* 31.9* 32.4* 37.4 37.2  MCV 78.7 78.4 77.9* 77.8* 77.5*  PLT 197 205 202 241 254    Cardiac Enzymes: No results for input(s): CKTOTAL, CKMB, CKMBINDEX, TROPONINI in the last 168 hours.  BNP: BNP (last 3 results)  Recent Labs  11/18/14 1015 09/07/15 2000  BNP 176.5* 848.6*    ProBNP (last 3 results) No results for input(s): PROBNP in the last 8760 hours.    Other results:  Imaging: Ct Chest Wo Contrast  09/21/2015  CLINICAL DATA:  Followup of pleural effusion. COPD. Shortness of breath. Pulmonary hypertension. Pneumonia. Bronchitis. EXAM: CT CHEST WITHOUT CONTRAST TECHNIQUE: Multidetector CT imaging of the chest was performed following the standard protocol without IV contrast. COMPARISON:  09/13/2015 PE study.  Chest radiograph of 09/21/2015. FINDINGS: Mediastinum/Nodes: A right-sided PICC line which terminates at the mid to low SVC. Aortic and branch vessel atherosclerosis. Moderate cardiomegaly. Multivessel coronary artery atherosclerosis. Aortic valvular calcifications. Pulmonary artery enlargement, including a 3.5 cm outflow tract. Mediastinal adenopathy, including a similar 11 mm precarinal node. Hilar regions poorly evaluated without intravenous contrast. Lungs/Pleura: Decrease in small right pleural effusion since the prior PET. Mild motion degradation.  Mild centrilobular emphysema. Persistent right base airspace disease, minimally progressive in the superior segment. Left upper lobe 1.2 cm nodule on  image 26/series 9, similar to the most recent CT. Worsened left-sided aeration with primarily dependent airspace disease. Upper abdomen: Normal imaged portions of the liver, spleen, stomach, adrenal glands, gallbladder, and left kidney. Musculoskeletal: lucent lesion within the T3 vertebral body was present back in 2015 and can be considered benign. Likely a hemangioma. IMPRESSION: 1. Motion degraded exam. 2. Decrease in small right pleural effusion since the prior CT. Slight worsening right base airspace disease, most consistent with infection or aspiration. 3. Patchy left base airspace disease is primarily felt to represent atelectasis. Concurrent infection cannot be excluded, especially laterally. 4. Cardiomegaly. Atherosclerosis, including within the coronary arteries. 5. Pulmonary artery enlargement suggests pulmonary arterial hypertension. 6. Aortic valvular calcifications could represent valvular disease. Echocardiography could be informative. 7. A left upper lobe pulmonary nodule is similar to on the most recent exam, and remains suspicious for primary bronchogenic carcinoma. Mild thoracic adenopathy which is indeterminate and could be reactive or metastatic. Recommend attention on follow-up. Electronically Signed   By: Abigail Miyamoto M.D.   On:  09/21/2015 18:48   Dg Chest Port 1 View  09/21/2015  CLINICAL DATA:  Right side rib pain, shortness of Breath EXAM: PORTABLE CHEST 1 VIEW COMPARISON:  09/20/2015 FINDINGS: Cardiomegaly again noted. There is small right pleural effusion right basilar atelectasis or infiltrate. Right arm PICC line with tip in SVC. Left lung is clear. IMPRESSION: Small right pleural effusion with right basilar atelectasis or infiltrate. Right arm PICC line in place. Electronically Signed   By: Lahoma Crocker M.D.   On: 09/21/2015 10:17   Dg Abd Portable 1v  09/21/2015  CLINICAL DATA:  Right lower quadrant pain EXAM: PORTABLE ABDOMEN - 1 VIEW COMPARISON:  CT scan 01/29/2014 FINDINGS:  There is normal small bowel gas pattern. Moderate stool noted in right colon and hepatic flexure of the colon. Moderate stool noted in descending colon and rectosigmoid colon. Partially visualized left hip prosthesis. Degenerative changes lumbar spine. Mild levoscoliosis lumbar spine. Small right pleural effusion with right basilar atelectasis or infiltrate. IMPRESSION: Normal small bowel gas pattern. Moderate colonic stool as described above. Mild levoscoliosis and degenerative changes lumbar spine. Electronically Signed   By: Lahoma Crocker M.D.   On: 09/21/2015 10:19     Medications:     Scheduled Medications: . acetaZOLAMIDE  250 mg Oral BID  . aspirin  81 mg Oral Daily  . atorvastatin  20 mg Oral Daily  . darifenacin  7.5 mg Oral Daily  . DULoxetine  60 mg Oral Daily  . ferrous sulfate  325 mg Oral BID WC  . gabapentin  300 mg Oral TID  . guaiFENesin  600 mg Oral BID  . insulin aspart  0-5 Units Subcutaneous QHS  . insulin aspart  0-9 Units Subcutaneous TID WC  . insulin aspart  3 Units Subcutaneous TID WC  . insulin glargine  5 Units Subcutaneous QHS  . lidocaine  1 patch Transdermal Q24H  . mometasone-formoterol  2 puff Inhalation BID  . pantoprazole  40 mg Oral Daily  . polyethylene glycol  17 g Oral Daily  . potassium chloride  10 mEq Intravenous Q1 Hr x 4  . potassium chloride  40 mEq Oral BID  . predniSONE  20 mg Oral Q breakfast  . senna-docusate  2 tablet Oral BID  . sodium chloride  250 mL Intravenous Once  . sodium chloride flush  3 mL Intravenous Q12H  . torsemide  40 mg Oral Daily  . traZODone  50 mg Oral QHS  . vitamin B-12  1,000 mcg Oral Daily    Infusions:    PRN Medications: sodium chloride, ALPRAZolam, ipratropium-albuterol, ondansetron (ZOFRAN) IV, oxyCODONE-acetaminophen, sodium chloride flush, sodium chloride flush   Assessment/Plan/Discussion     1. 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 TEE 4/27 with EF  45-50%, D-shaped interventricular septum, severely dilated RV with mild to moderate systolic dysfunction, moderate to severe AS.  She was started on dobutamine for RV support.  Volume status is now considerably improved.  CVP down to 5 this morning. - Continue po torsemide.   - Continue acetazolamide for marked respiratory alkalosis, hopefully will improve now that we are not diuresing her as aggressively.    - Stop dobutamine today. 2. Aortic stenosis: TEE done, AS is moderate to severe by numbers with mean gradient 37 and AVA 1.0 cm^2 => looking at valve, probably severe.  I reviewed her TEE with Dr Burt Knack.  While we suspect that the valve is hemodynamically significant, her main problem here  seems to be severe right heart failure.  She also has moderate to probably severe TR.  The right heart problems will likely not go away with TAVR, and we would be exposing her to a high risk procedure (may not be able to come off vent). For now, I think that we medically manage her RV failure.  3. Pulmonary hypertension: She has a history of pulmonary hypertension thought to be due to OHS/OSA. Suspect her [place] currently is a combination of OHS/OSA and pulmonary venous hypertension from volume overload.  - Volume overload resolved.  4. OHS/OSA: She is on high dose home O2 chronically. Had not been able to tolerate CPAP in the past but doing ok with it here.  Will have her wear CPAP at night.  5. COPD: On home O2, no longer smokes.  6. CAD: h/o RCA PCI. Would need coronary angiography prior to any consideration of valve intervention.  Continue ASA 81 and statin.  7. Tricuspid regurgitation: Moderate to probably severe in setting of severe RV dilation.  8. Pulmonary nodule: Enlarging pulmonary nodule concerning for bronchogenic carcinoma, especially with smoking history. This will have to be addressed after her heart issues are stabilized. Will need PET scan after cardiac issues stabilized (must be done as  outpatient).  - Thoracentesis on 4/25, bloody fluid sent for cytology, fluid negative for malignant cells. 9. AKI on CKD: Creatinine improved at 1.42 today.  Follow closely.  10. Alkalosis: Continue acetazolamide today at 250 mg bid.  Hopefully will improve with less aggressive diuresis.    11. Delirium: Confusion at times, worse at night.  12. Disposition: Will need SNF.   Loralie Champagne 09/23/2015 7:52 AM

## 2015-09-23 NOTE — Clinical Social Work Note (Addendum)
CSW met with patient's family member, and the plan is still to discharge to [facility].  Patient not medically ready for discharge, patient has also been confused today.  CSW to continue to follow patient's progress, Gold DNR on patient's chart awaiting signature by physician.  Jones Broom. [place], MSW, [place] 09/23/2015 4:44 PM

## 2015-09-23 NOTE — Progress Notes (Addendum)
[facility] Progress Note  Patient: Jillian Bright VQM:[id number]   PCP: [name], MD DOB: June 19, 1945   DOA: 09/07/2015   DOS: 09/23/2015   Date of Service: the patient was seen and examined on 09/23/2015 Outpatient Specialists: Vineet sood pulmonary Loralie Champagne cardiology  Subjective: The patient has persistent confusion, does not appear in any distress. No evidence" overnight. No nausea no vomiting. He remains constipated. Nutrition: Tolerating oral diet  Brief hospital course: Patient was admitted on 09/07/2015, with complaint of worsening shortness of breath and orthopnea, was found to have acute on chronic diastolic CHF with significant volume overload. Patient was started on IV Lasix and was diuresed. Cardiology was consulted and the patient was started on dopamine after a PICC line insertion. Echogram 1 showed moderate to severe aortic stenosis and cardiac thoracic surgery was consulted. Later on pulmonary was consulted as well for suspected pulmonary nodule as well as pleural effusion. The patient underwent right thoracentesis with bloody pleural fluid 600 mL drained. On 09/21/2015 TEE showed moderate aortic stenosis.  Currently further plan is continue diuresis, and continue managing oxygen requirement  Assessment and Plan: 1. Acute on chronic diastolic (congestive) heart failure (HCC) Probably worsening due to noncompliance as an outpatient since the note has documented that the patient has not been taking Lasix prior to admission. Cardiology as well as pulmonary has been consulted on the patient. Patient status post right pleural effusion thoracentesis. Initially was on dobutamine drip as well as Lasix infusion -30 L since admission, better than baseline weight as per documentation. Renal function stable. Dobutamine will be stopped today. Continue torsemide. Monitor daily weight and ins and outs  2. COPD with chronic respiratory failure. Acute on chronic hypoxic  respiratory failure, home oxygen 3 L  Right-sided pleural effusion. Exudative Left upper pulmonary nodule. Pulmonary was consulted and is currently following. Repeat CT scan shows evidence of atelectasis versus possible infiltrate although the patient is not exhibiting any signs of infection. Appreciate input. Continue incentive spirometry. Negative culture as well as cytology of the pleural fluid. ABG shows mixed respiratory acidosis and metabolic alkalosis, BiPAP when necessary ordered.  3. Aortic stenosis. Moderate to severe based on TEE. Cardiology feels that the patient would not be an ideal candidate for TAVR, since she has significant right-sided heart failure with severe TR and would not add any benefit from the surgery symptomatically.  4. Acute on chronic kidney injury. Chronic kidney disease stage II. Renal function mildly worsened during the hospitalization but improved after initiation of the dobutamine drip. Likely cardiorenal hemodynamics. We'll continue closely monitoring of the renal function, now that the patient is off of the debridement drip  4. Hypokalemia. Replacing. Hypomagnesemia. Resolved  5. Right-sided chest pain as well as right upper quadrant pain. LFTs are normal. Likely related to the pleural effusion and atelectasis. CT scan is not showing any acute abnormality on the upper abdomen.  6. Microcytic anemia. Likely associated with iron deficiency. We will initiate iron supplementation. Further GI evaluation as an outpatient. Status post PRBC on April 16.  7. Type 2 diabetes mellitus. Hemoglobin A1c 7.3 on 09/08/2015. Holding home metformin in the hospital. Also glipizide has been on hold. We will increase the sliding scale to moderate while the patient will be on prednisone. Increase Lantus to 8 units.  8. Pain management. Acute encephalopathy The patient at home is not on any chronic pain medication. In the hospital the patient has been  started on fentanyl patch. Given her encephalopathy I would discontinue  the fentanyl patch and would prefer oral when necessary Percocet for pain management as well as tramadol. Lidocaine patch for the chest pain.  9. Metabolic alkalosis Likely from aggressive diuresis. Also has chronic respiratory alkalosis at her baseline due to her COPD. Diamox has been added and will monitor daily.  10 vitamin B 12 deficiency. B-12 414,  Possibly lower side. Patient would benefit from supplementation.  Activity: physical therapy recommends SNF. Social worker on the case. Bowel regimen: last BM 09/14/2015. Bowel regimen initiated. Diet: Cardiac diet carb modified DVT Prophylaxis: subcutaneous Heparin  Advance goals of care discussion: DNR/DNI  Family Communication: no family was present at bedside, at the time of interview.   Disposition:  Expected discharge date: 09/26/2015 Barriers to safe discharge: Further treatment for the aortic stenosis as well as CHF  Consultants: Pulmonary, cardiac thoracic surgery, cardiology Procedures: Right thoracentesis 09/20/2015 TEE 09/21/2015, echocardiogram April 13,   Antibiotics: Anti-infectives    None        Intake/Output Summary (Last 24 hours) at 09/23/15 1259 Last data filed at 09/23/15 1107  Gross per 24 hour  Intake    754 ml  Output      0 ml  Net    754 ml   Filed Weights   09/21/15 0600 09/22/15 0500 09/23/15 0300  Weight: 83.9 kg (184 lb 15.5 oz) 78.7 kg (173 lb 8 oz) 79 kg (174 lb 2.6 oz)    Objective: Physical Exam: Filed Vitals:   09/23/15 0600 09/23/15 0737 09/23/15 0915 09/23/15 1130  BP: 127/69 123/73  96/60  Pulse: 91 98  91  Temp:  98.4 F (36.9 C)  96.9 F (36.1 C)  TempSrc:  Oral  Axillary  Resp: '11 16  17  '$ Height:      Weight:      SpO2: 99% 97% 95% 94%   General: Appear in Mild distress, no Rash; Oral Mucosa moist. Cardiovascular: S1 and S2 Present, aortic systolic Murmur, difficult to assess  JVD Respiratory: Bilateral Air entry present and bilateral Crackles, no wheezes Abdomen: Bowel Sound present, Soft and no tenderness Extremities: bilateral  Pedal edema, no calf tenderness Neurology: Disoriented, no focal deficit  Data Reviewed: CBC:  Recent Labs Lab 09/19/15 0432 09/20/15 0505 09/21/15 0355 09/22/15 0405 09/23/15 0432  WBC 6.7 5.0 5.9 7.9 8.3  NEUTROABS  --   --   --  7.1  --   HGB 8.7* 8.3* 8.5* 9.9* 10.1*  HCT 34.0* 31.9* 32.4* 37.4 37.2  MCV 78.7 78.4 77.9* 77.8* 77.5*  PLT 197 205 202 241 825   Basic Metabolic Panel:  Recent Labs Lab 09/18/15 0445  09/20/15 0505 09/21/15 0355 09/21/15 1813 09/22/15 0405 09/23/15 0432  NA 130*  < > 132* 133* 134* 134* 132*  K 3.8  < > 4.3 2.8* 3.4* 3.5 2.4*  CL 76*  < > 74* 68* 69* 66* 67*  CO2 43*  < > 45* >50* 50* >50* >50*  GLUCOSE 212*  < > 242* 206* 123* 226* 144*  BUN 28*  < > 46* 45* 42* 43* 53*  CREATININE 1.12*  < > 1.91* 1.58* 1.33* 1.45* 1.42*  CALCIUM 9.0  < > 8.5* 9.1 9.5 9.8 9.9  MG 1.2*  --   --  1.4*  --  2.1 1.9  < > = values in this interval not displayed. GFR: Estimated Creatinine Clearance: 38.5 mL/min (by C-G formula based on Cr of 1.42). Liver Function Tests:  Recent Labs Lab 09/20/15 1657 09/21/15 0355  09/22/15 0405  AST  --  35 42*  ALT  --  21 29  ALKPHOS  --  53 68  BILITOT  --  1.3* 1.3*  PROT 6.5 7.1 8.0  ALBUMIN  --  3.4* 3.9   No results for input(s): LIPASE, AMYLASE in the last 168 hours. No results for input(s): AMMONIA in the last 168 hours. Coagulation Profile:  Recent Labs Lab 09/19/15 0432  INR 1.34   Cardiac Enzymes: No results for input(s): CKTOTAL, CKMB, CKMBINDEX, TROPONINI in the last 168 hours. BNP (last 3 results) No results for input(s): PROBNP in the last 8760 hours.  CBG:  Recent Labs Lab 09/22/15 1330 09/22/15 1644 09/22/15 2127 09/23/15 0741 09/23/15 1138  GLUCAP 185* 209* 154* 137* 215*   Lipid Profile:  Recent Labs   09/21/15 0355  CHOL 125   Urine analysis:    Component Value Date/Time   COLORURINE YELLOW 09/13/2015 2230   APPEARANCEUR CLEAR 09/13/2015 2230   LABSPEC 1.031* 09/13/2015 2230   PHURINE 6.0 09/13/2015 2230   GLUCOSEU NEGATIVE 09/13/2015 2230   HGBUR NEGATIVE 09/13/2015 2230   HGBUR negative 08/04/2008 1526   BILIRUBINUR NEGATIVE 09/13/2015 2230   BILIRUBINUR n 11/24/2012 1335   KETONESUR NEGATIVE 09/13/2015 2230   PROTEINUR NEGATIVE 09/13/2015 2230   PROTEINUR trace 11/24/2012 1335   UROBILINOGEN 0.2 11/18/2014 0750   UROBILINOGEN 0.2 11/24/2012 1335   NITRITE NEGATIVE 09/13/2015 2230   NITRITE n 11/24/2012 1335   LEUKOCYTESUR NEGATIVE 09/13/2015 2230    Recent Results (from the past 240 hour(s))  MRSA PCR Screening     Status: None   Collection Time: 09/14/15  8:52 PM  Result Value Ref Range Status   MRSA by PCR NEGATIVE NEGATIVE Final    Comment:        The GeneXpert MRSA Assay (FDA approved for NASAL specimens only), is one component of a comprehensive MRSA colonization surveillance program. It is not intended to diagnose MRSA infection nor to guide or monitor treatment for MRSA infections.   Body fluid culture     Status: None (Preliminary result)   Collection Time: 09/20/15  3:42 PM  Result Value Ref Range Status   Specimen Description PLEURAL RIGHT FLUID  Final   Special Requests Normal  Final   Gram Stain   Final    MODERATE WBC PRESENT,BOTH PMN AND MONONUCLEAR NO ORGANISMS SEEN    Culture NO GROWTH 3 DAYS  Final   Report Status PENDING  Incomplete   Studies: No results found.   Scheduled Meds: . acetaZOLAMIDE  250 mg Oral BID  . aspirin  81 mg Oral Daily  . atorvastatin  20 mg Oral Daily  . darifenacin  7.5 mg Oral Daily  . DULoxetine  60 mg Oral Daily  . ferrous sulfate  325 mg Oral BID WC  . gabapentin  200 mg Oral TID  . guaiFENesin  600 mg Oral BID  . insulin aspart  0-5 Units Subcutaneous QHS  . insulin aspart  0-9 Units Subcutaneous  TID WC  . insulin aspart  3 Units Subcutaneous TID WC  . insulin glargine  5 Units Subcutaneous QHS  . lidocaine  1 patch Transdermal Q24H  . mometasone-formoterol  2 puff Inhalation BID  . pantoprazole  40 mg Oral Daily  . polyethylene glycol  17 g Oral Daily  . potassium chloride  40 mEq Oral BID  . senna-docusate  2 tablet Oral BID  . sodium chloride flush  3 mL Intravenous Q12H  .  torsemide  40 mg Oral Daily  . vitamin B-12  1,000 mcg Oral Daily   Continuous Infusions:   PRN Meds: sodium chloride, ALPRAZolam, ipratropium-albuterol, ondansetron (ZOFRAN) IV, oxyCODONE-acetaminophen, sodium chloride flush, sodium chloride flush, traZODone  Time spent: 30 minutes  Author: Berle Mull, MD [facility] Pager: [phone] 09/23/2015 12:59 PM  If 7PM-7AM, please contact night-coverage at [email or web address], password [facility]

## 2015-09-23 NOTE — Progress Notes (Signed)
Inpatient Diabetes Program Recommendations  AACE/ADA: New Consensus Statement on Inpatient Glycemic Control (2015)  Target Ranges:  Prepandial:   less than 140 mg/dL      Peak postprandial:   less than 180 mg/dL (1-2 hours)      Critically ill patients:  140 - 180 mg/dL   Results for ROANNA, REAVES (MRN [id number]) as of 09/23/2015 08:19  Ref. Range 09/22/2015 07:54 09/22/2015 11:29 09/22/2015 13:30 09/22/2015 16:44 09/22/2015 21:27  Glucose-Capillary Latest Ref Range: 65-99 mg/dL 189 (H) 216 (H) 185 (H) 209 (H) 154 (H)    Home DM Meds: Glipizide 10 mg bid       Metformin 500 mg tidwc       Januvia 100 mg daily  Current Insulin Orders: Lantus 5 units QHS      Novolog Sensitive Correction Scale/ SSI (0-9 units) TID AC + HS      Novolog 3 units tidwc     MD- Please consider increasing Novolog Meal Coverage to 4 units tidwc while patient getting Prednisone 20 mg daily     --Will follow patient during hospitalization--  Wyn Quaker RN, MSN, CDE Diabetes Coordinator Inpatient Glycemic Control Team Team Pager: [phone] (8a-5p)

## 2015-09-23 NOTE — Progress Notes (Addendum)
Name: Jillian Bright MRN: [id number] DOB: 1945/09/21    ADMISSION DATE:  09/07/2015 CONSULTATION DATE:  4/19  REFERRING MD :  [facility]   CHIEF COMPLAINT:  Lung nodule   BRIEF PATIENT DESCRIPTION: 70yo morbidly obese female former smoker (quit 2009) with multiple medical problems including COPD, OSA (intolerant to CPAP), Pulmonary HTN (PA peak pressure 61 mmHg), dCHF, Severe AS, CAD, chronic respiratory failure on home O2 initially presented 4/12 with 3 week hx BLE edema, SOB, orthopnea.  She was admitted, diuresed and seen in consultation by CVTS for possible aortic valve repair/replacement.  CTA chest to r/o PE revealed enlarging LUL pulmonary nodule and PCCM consulted.   SIGNIFICANT EVENTS  4/12 admitted for SOB 4/19 PCCM consulted for enlarging LUL nodule.  4/24 could not lay flat cath, dobutamine, lasix drip 4/25- thor 600 bloody tap 4/26- neg 6.8 liters!!, improved resp status  STUDIES:  CTA chest 4/19>>>Negative for pulmonary embolus.  Left upper lobe pulmonary nodule has enlarged since the prior CT scan and could be a bronchogenic carcinoma. PET CT scan is recommended for further evaluation.  Moderate right pleural effusion with associated compressive atelectasis. 2D echo 4/13>>> 09-32%, grade 1 diastolic dysfunction, severe AS, mild MR, severely dilated RA, PA peak pressure 59mHg TEE 4/27>>>D-shaped septum suggesting RV pressure/volume overload. LV EF 45-50%, diffuse hypokinesis. Severely dilated RV with mild to moderate systolic dysfunction. Mild left atrial enlargement, no LA appendage thrombus. Moderate to severe right atrial enlargement. There was moderate to severe tricuspid regurgitation. Mild to moderate mitral regurgitation. The aortic valve was trileaflet with severe calcification. There was moderate to severe aortic stenosis with mean gradient 37 mmHg and AVA 1.0 cm^2.  CT chest after thora rt 2/27>>>small left over effusion, ATX rt base and cnnot exlude  infiltrate  HISTORY OF PRESENT ILLNESS:  69yo morbidly obese female former smoker (quit 2009) with multiple medical problems including COPD, OSA (intolerant to CPAP), Pulmonary HTN (PA peak pressure 61 mmHg), dCHF, Severe AS, CAD, chronic respiratory failure on home O2 initially presented 4/12 with 3 week hx BLE edema, SOB, orthopnea.  She was admitted, diuresed and seen in consultation by CVTS for possible aortic valve repair/replacement.  CTA chest to r/o PE revealed enlarging LUL pulmonary nodule and PCCM consulted.    SUBJECTIVE:  CT chest done Tee done reviewed Needs cath still To 4 liters, does desat fast  VITAL SIGNS: Temp:  [97.6 F (36.4 C)-98.4 F (36.9 C)] 98.4 F (36.9 C) (04/28 0737) Pulse Rate:  [68-128] 98 (04/28 0737) Resp:  [11-22] 16 (04/28 0737) BP: (94-136)/(32-97) 123/73 mmHg (04/28 0737) SpO2:  [91 %-100 %] 95 % (04/28 0915) Weight:  [79 kg (174 lb 2.6 oz)] 79 kg (174 lb 2.6 oz) (04/28 0300)  PHYSICAL EXAMINATION: General: NAD , in bed, no distress Neuro:  Awake, alert, appropriate, MAE  HEENT:  Mm moist, no JVD  Cardiovascular:  s1s2 rr murm Lungs: Improved aeration rt base Abdomen:  Round, soft, +bs  Musculoskeletal:  Warm and dry, Gr 2 BLE edema reduced, erythema     Recent Labs Lab 09/21/15 1813 09/22/15 0405 09/23/15 0432  NA 134* 134* 132*  K 3.4* 3.5 2.4*  CL 69* 66* 67*  CO2 50* >50* >50*  BUN 42* 43* 53*  CREATININE 1.33* 1.45* 1.42*  GLUCOSE 123* 226* 144*    Recent Labs Lab 09/21/15 0355 09/22/15 0405 09/23/15 0432  HGB 8.5* 9.9* 10.1*  HCT 32.4* 37.4 37.2  WBC 5.9 7.9 8.3  PLT 202 241 254  Ct Chest Wo Contrast  09/21/2015  CLINICAL DATA:  Followup of pleural effusion. COPD. Shortness of breath. Pulmonary hypertension. Pneumonia. Bronchitis. EXAM: CT CHEST WITHOUT CONTRAST TECHNIQUE: Multidetector CT imaging of the chest was performed following the standard protocol without IV contrast. COMPARISON:  09/13/2015 PE study.   Chest radiograph of 09/21/2015. FINDINGS: Mediastinum/Nodes: A right-sided PICC line which terminates at the mid to low SVC. Aortic and branch vessel atherosclerosis. Moderate cardiomegaly. Multivessel coronary artery atherosclerosis. Aortic valvular calcifications. Pulmonary artery enlargement, including a 3.5 cm outflow tract. Mediastinal adenopathy, including a similar 11 mm precarinal node. Hilar regions poorly evaluated without intravenous contrast. Lungs/Pleura: Decrease in small right pleural effusion since the prior PET. Mild motion degradation.  Mild centrilobular emphysema. Persistent right base airspace disease, minimally progressive in the superior segment. Left upper lobe 1.2 cm nodule on image 26/series 9, similar to the most recent CT. Worsened left-sided aeration with primarily dependent airspace disease. Upper abdomen: Normal imaged portions of the liver, spleen, stomach, adrenal glands, gallbladder, and left kidney. Musculoskeletal: lucent lesion within the T3 vertebral body was present back in 2015 and can be considered benign. Likely a hemangioma. IMPRESSION: 1. Motion degraded exam. 2. Decrease in small right pleural effusion since the prior CT. Slight worsening right base airspace disease, most consistent with infection or aspiration. 3. Patchy left base airspace disease is primarily felt to represent atelectasis. Concurrent infection cannot be excluded, especially laterally. 4. Cardiomegaly. Atherosclerosis, including within the coronary arteries. 5. Pulmonary artery enlargement suggests pulmonary arterial hypertension. 6. Aortic valvular calcifications could represent valvular disease. Echocardiography could be informative. 7. A left upper lobe pulmonary nodule is similar to on the most recent exam, and remains suspicious for primary bronchogenic carcinoma. Mild thoracic adenopathy which is indeterminate and could be reactive or metastatic. Recommend attention on follow-up. Electronically  Signed   By: Abigail Miyamoto M.D.   On: 09/21/2015 18:48    ASSESSMENT / PLAN:  LUL pulmonary nodule (11.70m x 8.7109m - slightly increased from previous CT scan in 2015 COPD. Severe with an FEV1 of 1.12 or 46% OSA - intolerant to CPAP -  OHS Secondary PAH R effusion with associated RLL collapse - hemorrhagic, unclear etiology, r.o malignant Pt with mod-severe AS, RV failure, acute on chronic diastolic HF exacerbation, CAD Acute on Chronic Hypoxemic Hypercapneic Resp Fx 2/2 above  REC -  Neg balance goals remain k supp I reviewed CT chest no malignancy on RIGHT noted Path negative , cause effusion bloody rt still does NOT exclude cancer, fall? Keep sat 94% and avoid desaturation with PA HTN, rv findings on TEE Would repeat pcxr Monday for reoccurrence effusion cons fusion delirium?  ABG does NOT explain this No role NIMV, at baseline co2  pleuric chets pain  Resolved, dc pred  Will return Monday,c all if needed  DaLavon PaganiniFeTitus MouldMD, FAAnchor Pointgr: [facility]

## 2015-09-23 NOTE — Progress Notes (Signed)
Attempted to wean patient off of O2. Was able to wean her down 4liters [place]. Sats remained 90-93%.

## 2015-09-23 NOTE — Progress Notes (Signed)
Repeat BMET with K 5.6 up from 2.4.   Hold any further K and recheck this evening.    Legrand Como [id number]" [place], PA-C 09/23/2015 3:20 PM

## 2015-09-24 DIAGNOSIS — G9340 Encephalopathy, unspecified: Secondary | ICD-10-CM

## 2015-09-24 LAB — RENAL FUNCTION PANEL
ALBUMIN: 3.6 g/dL (ref 3.5–5.0)
Anion gap: 12 (ref 5–15)
BUN: 65 mg/dL — ABNORMAL HIGH (ref 6–20)
CALCIUM: 9.5 mg/dL (ref 8.9–10.3)
CHLORIDE: 75 mmol/L — AB (ref 101–111)
CO2: 45 mmol/L — AB (ref 22–32)
Creatinine, Ser: 1.39 mg/dL — ABNORMAL HIGH (ref 0.44–1.00)
GFR calc Af Amer: 43 mL/min — ABNORMAL LOW (ref >60)
GFR, EST NON AFRICAN AMERICAN: 37 mL/min — AB (ref >60)
Glucose, Bld: 127 mg/dL — ABNORMAL HIGH (ref 65–99)
Phosphorus: 3.9 mg/dL (ref 2.5–4.6)
Potassium: 2.8 mmol/L — ABNORMAL LOW (ref 3.5–5.1)
Sodium: 132 mmol/L — ABNORMAL LOW (ref 135–145)

## 2015-09-24 LAB — BASIC METABOLIC PANEL
ANION GAP: 14 (ref 5–15)
BUN: 62 mg/dL — ABNORMAL HIGH (ref 6–20)
CHLORIDE: 76 mmol/L — AB (ref 101–111)
CO2: 43 mmol/L — ABNORMAL HIGH (ref 22–32)
CREATININE: 1.41 mg/dL — AB (ref 0.44–1.00)
Calcium: 9.6 mg/dL (ref 8.9–10.3)
GFR calc non Af Amer: 37 mL/min — ABNORMAL LOW (ref >60)
GFR, EST AFRICAN AMERICAN: 42 mL/min — AB (ref >60)
Glucose, Bld: 81 mg/dL (ref 65–99)
POTASSIUM: 3.4 mmol/L — AB (ref 3.5–5.1)
SODIUM: 133 mmol/L — AB (ref 135–145)

## 2015-09-24 LAB — BODY FLUID CULTURE
Culture: NO GROWTH
Special Requests: NORMAL

## 2015-09-24 LAB — PROTIME-INR
INR: 1.23 (ref 0.00–1.49)
Prothrombin Time: 15.7 seconds — ABNORMAL HIGH (ref 11.6–15.2)

## 2015-09-24 LAB — CBC WITH DIFFERENTIAL/PLATELET
BASOS ABS: 0 10*3/uL (ref 0.0–0.1)
Basophils Relative: 0 %
EOS PCT: 1 %
Eosinophils Absolute: 0.1 10*3/uL (ref 0.0–0.7)
HEMATOCRIT: 38 % (ref 36.0–46.0)
Hemoglobin: 10.2 g/dL — ABNORMAL LOW (ref 12.0–15.0)
LYMPHS PCT: 18 %
Lymphs Abs: 1.4 10*3/uL (ref 0.7–4.0)
MCH: 20.8 pg — ABNORMAL LOW (ref 26.0–34.0)
MCHC: 26.8 g/dL — ABNORMAL LOW (ref 30.0–36.0)
MCV: 77.6 fL — AB (ref 78.0–100.0)
Monocytes Absolute: 1.1 10*3/uL — ABNORMAL HIGH (ref 0.1–1.0)
Monocytes Relative: 13 %
NEUTROS ABS: 5.5 10*3/uL (ref 1.7–7.7)
NEUTROS PCT: 68 %
PLATELETS: 302 10*3/uL (ref 150–400)
RBC: 4.9 MIL/uL (ref 3.87–5.11)
RDW: 19.9 % — ABNORMAL HIGH (ref 11.5–15.5)
WBC: 8.1 10*3/uL (ref 4.0–10.5)

## 2015-09-24 LAB — CARBOXYHEMOGLOBIN
Carboxyhemoglobin: 2.2 % — ABNORMAL HIGH (ref 0.5–1.5)
METHEMOGLOBIN: 0.8 % (ref 0.0–1.5)
O2 Saturation: 67.5 %
Total hemoglobin: 10.7 g/dL — ABNORMAL LOW (ref 12.0–16.0)

## 2015-09-24 LAB — GLUCOSE, CAPILLARY
GLUCOSE-CAPILLARY: 89 mg/dL (ref 65–99)
Glucose-Capillary: 154 mg/dL — ABNORMAL HIGH (ref 65–99)
Glucose-Capillary: 347 mg/dL — ABNORMAL HIGH (ref 65–99)
Glucose-Capillary: 244 mg/dL — ABNORMAL HIGH (ref 65–99)

## 2015-09-24 LAB — MAGNESIUM: Magnesium: 1.7 mg/dL (ref 1.7–2.4)

## 2015-09-24 MED ORDER — POTASSIUM CHLORIDE CRYS ER 20 MEQ PO TBCR
40.0000 meq | EXTENDED_RELEASE_TABLET | Freq: Two times a day (BID) | ORAL | Status: DC
Start: 2015-09-24 — End: 2015-09-25
  Administered 2015-09-24: 40 meq via ORAL
  Filled 2015-09-24 (×2): qty 2

## 2015-09-24 MED ORDER — CETYLPYRIDINIUM CHLORIDE 0.05 % MT LIQD
7.0000 mL | Freq: Two times a day (BID) | OROMUCOSAL | Status: DC
  Administered 2015-09-24 – 2015-09-28 (×9): 7 mL via OROMUCOSAL

## 2015-09-24 MED ORDER — POTASSIUM CHLORIDE CRYS ER 20 MEQ PO TBCR
40.0000 meq | EXTENDED_RELEASE_TABLET | Freq: Once | ORAL | Status: AC
  Administered 2015-09-24: 40 meq via ORAL
  Filled 2015-09-24: qty 2

## 2015-09-24 MED ORDER — HEPARIN SODIUM (PORCINE) 5000 UNIT/ML IJ SOLN
5000.0000 [IU] | Freq: Three times a day (TID) | INTRAMUSCULAR | Status: DC
  Administered 2015-09-24 – 2015-09-28 (×12): 5000 [IU] via SUBCUTANEOUS
  Filled 2015-09-24 (×13): qty 1

## 2015-09-24 NOTE — Progress Notes (Signed)
RT removed HFNC and placed pt on 6L [place]. Pt tol well

## 2015-09-24 NOTE — Progress Notes (Signed)
Patient ID: Jillian Bright, female   DOB: 08/24/1945, 70 y.o.   MRN: [id number]       Subjective:   70 year old female history of coronary artery disease, congestive heart failure, diabetes mellitus, oxygen dependent COPD, obstructive sleep apnea-intolerant to CPAP, pulmonary hypertension, obesity, hyperlipidemia. She was last seen by Dr. Aundra Dubin in August 2013. She initially presented with peripheral edema in 2009 and had a normal stress test and preserved EF on echocardiogram. She had a right heart catheterization which showed mild/moderate pulmonary hypertension which responded well to oxygen with decreased PA pressure. She had 2-D echocardiogram on 09/08/2015 revealing normal ejection fraction of 60-65% and wall motion. Grade 1 diastolic dysfunction. Moderate to severe aortic stenosis where previously was moderate. Mild MR. Left atrium mildly dilated. Peak PA pressure 61 mmHg. Right atrium moderately to severely dilated. Moderate tricuspid valve regurgitation.  Thoracentesis 4/25 with 600 cc bloody fluid out.   TEE (09/21/15) with normal LV size with D-shaped septum suggesting RV pressure/volume overload. LV EF 45-50%, diffuse hypokinesis. Severely dilated RV with mild to moderate systolic dysfunction. Mild left atrial enlargement, no LA appendage thrombus. Moderate to severe right atrial enlargement. There was moderate to severe tricuspid regurgitation. Mild to moderate mitral regurgitation. The aortic valve was trileaflet with severe calcification. There was moderate to severe aortic stenosis with mean gradient 37 mmHg and AVA 1.0 cm^2.   09/13/15 CTA: negative for PE, enlarging lung nodule noted.  Continued episodes of confusion overnight.  Relatively clear this morning but somnolent at times, sitter at bedise.  Breathing much improved.   CVP now down to 5.    Objective:   Weight Range:  Vital Signs:   Temp:  [96.9 F (36.1 C)-98.9 F (37.2 C)] 98 F (36.7 C) (04/29  0738) Pulse Rate:  [85-97] 87 (04/29 0738) Resp:  [11-18] 13 (04/29 0738) BP: (95-113)/(60-85) 106/65 mmHg (04/29 0738) SpO2:  [86 %-98 %] 94 % (04/29 0936) Weight:  [173 lb 15.1 oz (78.9 kg)] 173 lb 15.1 oz (78.9 kg) (04/29 0417) Last BM Date: 09/23/15  Weight change: Filed Weights   09/22/15 0500 09/23/15 0300 09/24/15 0417  Weight: 173 lb 8 oz (78.7 kg) 174 lb 2.6 oz (79 kg) 173 lb 15.1 oz (78.9 kg)    Intake/Output:   Intake/Output Summary (Last 24 hours) at 09/24/15 1011 Last data filed at 09/24/15 0900  Gross per 24 hour  Intake   1500 ml  Output      0 ml  Net   1500 ml     Physical Exam: General: NAD. No resp difficulty. In bed.  HEENT: normal Neck: supple. JVP 5 cm. Carotids 2+ bilat; no bruits. No lymphadenopathy or thryomegaly appreciated. Cor: PMI nondisplaced. Regular rate & rhythm. No rubs, gallops.  3/6 crescendo-decrescendo murmur RUSB with muffled S2. Lungs: RML RLL decreased. On 3 liters oxygen Abdomen: soft, nontender, nondistended. No hepatosplenomegaly. No bruits or masses. Good bowel sounds. Extremities: no cyanosis, clubbing, rash.  1+ Edema  wearing unna boots. Neuro: alert & orientedx3, cranial nerves grossly intact. moves all 4 extremities w/o difficulty. Affect pleasant  Telemetry: ST 100s   Labs: Basic Metabolic Panel:  Recent Labs Lab 09/18/15 0445  09/21/15 0355  09/22/15 0405 09/23/15 0432 09/23/15 1359 09/23/15 2025 09/24/15 0400  NA 130*  < > 133*  < > 134* 132* 127* 130* 132*  K 3.8  < > 2.8*  < > 3.5 2.4* 5.6* 3.4* 2.8*  CL 76*  < > 68*  < > 66*  67* 67* 69* 75*  CO2 43*  < > >50*  < > >50* >50* 46* 46* 45*  GLUCOSE 212*  < > 206*  < > 226* 144* 202* 157* 127*  BUN 28*  < > 45*  < > 43* 53* 53* 64* 65*  CREATININE 1.12*  < > 1.58*  < > 1.45* 1.42* 1.44* 1.62* 1.39*  CALCIUM 9.0  < > 9.1  < > 9.8 9.9 9.8 9.9 9.5  MG 1.2*  --  1.4*  --  2.1 1.9  --   --  1.7  PHOS  --   --   --   --   --   --   --   --  3.9  < > = values in  this interval not displayed.  Liver Function Tests:  Recent Labs Lab 09/20/15 1657 09/21/15 0355 09/22/15 0405 09/24/15 0400  AST  --  35 42*  --   ALT  --  21 29  --   ALKPHOS  --  53 68  --   BILITOT  --  1.3* 1.3*  --   PROT 6.5 7.1 8.0  --   ALBUMIN  --  3.4* 3.9 3.6   No results for input(s): LIPASE, AMYLASE in the last 168 hours. No results for input(s): AMMONIA in the last 168 hours.  CBC:  Recent Labs Lab 09/20/15 0505 09/21/15 0355 09/22/15 0405 09/23/15 0432 09/24/15 0400  WBC 5.0 5.9 7.9 8.3 8.1  NEUTROABS  --   --  7.1  --  5.5  HGB 8.3* 8.5* 9.9* 10.1* 10.2*  HCT 31.9* 32.4* 37.4 37.2 38.0  MCV 78.4 77.9* 77.8* 77.5* 77.6*  PLT 205 202 241 254 302    Cardiac Enzymes: No results for input(s): CKTOTAL, CKMB, CKMBINDEX, TROPONINI in the last 168 hours.  BNP: BNP (last 3 results)  Recent Labs  11/18/14 1015 09/07/15 2000  BNP 176.5* 848.6*    ProBNP (last 3 results) No results for input(s): PROBNP in the last 8760 hours.    Other results:  Imaging: No results found.   Medications:     Scheduled Medications: . acetaZOLAMIDE  250 mg Oral BID  . antiseptic oral rinse  7 mL Mouth Rinse BID  . aspirin  81 mg Oral Daily  . atorvastatin  20 mg Oral Daily  . darifenacin  7.5 mg Oral Daily  . DULoxetine  60 mg Oral Daily  . ferrous sulfate  325 mg Oral BID WC  . gabapentin  200 mg Oral TID  . guaiFENesin  600 mg Oral BID  . heparin subcutaneous  5,000 Units Subcutaneous Q8H  . insulin aspart  0-5 Units Subcutaneous QHS  . insulin aspart  0-9 Units Subcutaneous TID WC  . insulin aspart  3 Units Subcutaneous TID WC  . insulin glargine  5 Units Subcutaneous QHS  . lidocaine  1 patch Transdermal Q24H  . mometasone-formoterol  2 puff Inhalation BID  . pantoprazole  40 mg Oral Daily  . polyethylene glycol  17 g Oral Daily  . potassium chloride  40 mEq Oral BID WC  . senna-docusate  2 tablet Oral BID  . sodium chloride flush  3 mL  Intravenous Q12H  . torsemide  40 mg Oral Daily  . vitamin B-12  1,000 mcg Oral Daily    Infusions:    PRN Medications: sodium chloride, ALPRAZolam, ipratropium-albuterol, ondansetron (ZOFRAN) IV, oxyCODONE-acetaminophen, sodium chloride flush, sodium chloride flush, traZODone   Assessment/Plan/Discussion  1. 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 TEE 4/27 with EF 45-50%, D-shaped interventricular septum, severely dilated RV with mild to moderate systolic dysfunction, moderate to severe AS.  She was started on dobutamine for RV support.  Volume status is now considerably improved.  CVP down to 5 this morning. - Continue po torsemide.  Replete KCL. - Continue acetazolamide for marked respiratory alkalosis, hopefully will improve now that we are not diuresing her as aggressively.    - Off dobutamine Friday 4/28  2. Aortic stenosis: TEE done, AS is moderate to severe by numbers with mean gradient 37 and AVA 1.0 cm^2 => looking at valve, probably severe.  Dr. Aundra Dubin reviewed her TEE with Dr Burt Knack.  While we suspect that the valve is hemodynamically significant, her main problem here seems to be severe right heart failure.  She also has moderate to probably severe TR.  The right heart problems will likely not go away with TAVR, and we would be exposing her to a high risk procedure (may not be able to come off vent). For now, I think that we medically manage her RV failure.   3. Pulmonary hypertension: She has a history of pulmonary hypertension thought to be due to OHS/OSA. Suspect her [place] currently is a combination of OHS/OSA and pulmonary venous hypertension from volume overload.  - Volume overload improved. Contributing to LE edema.  4. OHS/OSA: She is on high dose home O2 chronically. Had not been able to tolerate CPAP in the past but doing ok with it here.  Will have her wear CPAP at night.   5. COPD: On home O2, no longer smokes.   6.  CAD: h/o RCA PCI. Would need coronary angiography prior to any consideration of valve intervention however AVR would not resolve right heart failure.  Continue ASA 81 and statin.   7. Tricuspid regurgitation: Moderate to probably severe in setting of severe RV dilation.   8. Pulmonary nodule: Enlarging pulmonary nodule concerning for bronchogenic carcinoma, especially with smoking history. This will have to be addressed after her heart issues are stabilized. Will need PET scan after cardiac issues stabilized (must be done as outpatient).  - Thoracentesis on 4/25, bloody fluid sent for cytology, fluid negative for malignant cells. 9. AKI on CKD: Creatinine improved at 1.42 today.  Follow closely.   10. Alkalosis: Continue acetazolamide today at 250 mg bid.  Hopefully will improve with less aggressive diuresis.     11. Delirium: Confusion at times, worse at night.   12. Disposition: Will need SNF.     Marlou Porch, [place] 09/24/2015 10:11 AM

## 2015-09-24 NOTE — Progress Notes (Signed)
[facility] Progress Note  Patient: Jillian Bright PJA:[id number]   PCP: [name], MD DOB: 24-Jan-1946   DOA: 09/07/2015   DOS: 09/24/2015   Date of Service: the patient was seen and examined on 09/24/2015 Outpatient Specialists: Vineet sood pulmonary Loralie Champagne cardiology  Subjective: Patient denies any acute complaint. Somewhat more lethargic this morning. No acute given in divided overnight. Nutrition: Tolerating oral diet  Brief hospital course: Patient was admitted on 09/07/2015, with complaint of worsening shortness of breath and orthopnea, was found to have acute on chronic diastolic CHF with significant volume overload. Patient was started on IV Lasix and was diuresed. Cardiology was consulted and the patient was started on dopamine after a PICC line insertion. Echogram 1 showed moderate to severe aortic stenosis and cardiac thoracic surgery was consulted. Later on pulmonary was consulted as well for suspected pulmonary nodule as well as pleural effusion. The patient underwent right thoracentesis with bloody pleural fluid 600 mL drained. On 09/21/2015 TEE showed moderate aortic stenosis. Pleural fluid cytology is negative for malignancy, culture is also negative. 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 Patient will need outpatient PET scan. Dobutamine has been stopped on 09/23/2015. Torsemide is started now.  Currently further plan is continue diuresis, and continue managing oxygen requirement  Assessment and Plan: 1. Acute on chronic diastolic (congestive) heart failure (HCC) Probably worsening due to noncompliance as an outpatient since the note has documented that the patient has not been taking Lasix prior to admission. Cardiology as well as pulmonary has been consulted on the patient. Patient status post right pleural effusion thoracentesis. Initially was on dobutamine drip as well as Lasix  infusion, now discontinued since 09/23/2015 -30 L since admission. Since Foley catheter is out ins and outs are not documented adequately. Weight continues to go down. Renal function stable. Continue torsemide. Monitor daily weight and ins and outs  2. COPD with chronic respiratory failure. Acute on chronic hypoxic respiratory failure, home oxygen 3 L  Right-sided pleural effusion. Exudative Left upper pulmonary nodule. Pulmonary was consulted and is currently following. Repeat CT scan shows evidence of atelectasis versus possible infiltrate although the patient is not exhibiting any signs of infection. Appreciate input. Continue incentive spirometry. Negative culture as well as cytology of the pleural fluid. ABG shows mixed respiratory acidosis and metabolic alkalosis, BiPAP when necessary ordered. Repeat chest x-ray on Monday. Patient is been able to calm off of the high flow nasal cannula setup and is currently on nasal cannula at 6 L. Maintain 94% saturation  3. Aortic stenosis. Moderate to severe based on TEE. Cardiology feels that the patient would not be an ideal candidate for TAVR, since she has significant right-sided heart failure with severe TR and would not add any benefit from the surgery symptomatically.  4. Acute on chronic kidney injury. Chronic kidney disease stage II. Renal function mildly worsened during the hospitalization but improved after initiation of the dobutamine drip. Likely cardiorenal hemodynamics. We'll continue closely monitoring of the renal function, now that the patient is off of the debridement drip  4. Hypokalemia. Replacing. Hypomagnesemia. Resolved  5. Right-sided chest pain as well as right upper quadrant pain. LFTs are normal. Likely related to the pleural effusion and atelectasis. CT scan is not showing any acute abnormality on the upper abdomen.  6. Microcytic anemia. Likely associated with iron deficiency. We will initiate iron  supplementation. Further GI evaluation as an outpatient. Status  post PRBC on April 16.  7. Type 2 diabetes mellitus. Hemoglobin A1c 7.3 on 09/08/2015. Holding home metformin in the hospital. Also glipizide has been on hold. We will increase the sliding scale to moderate while the patient will be on prednisone. Increase Lantus to 8 units.  8. Pain management. Acute encephalopathy The patient at home is not on any chronic pain medication. In the hospital the patient has been started on fentanyl patch. Given her encephalopathy I would discontinue the fentanyl patch and would prefer oral when necessary Percocet for pain management as well as tramadol. Lidocaine patch for the chest pain.  9. Metabolic alkalosis Likely from aggressive diuresis. Significantly improving Also has chronic respiratory alkalosis at her baseline due to her COPD. Diamox has been added and will monitor daily. Likely can come off on April 30.  10 vitamin B 12 deficiency. B-12 414,  Low normal side. Patient would benefit from supplementation.  Activity: physical therapy recommends SNF. Social worker on the case. Bowel regimen: last BM 09/23/2015. Bowel regimen initiated. Diet: Cardiac diet carb modified DVT Prophylaxis: subcutaneous Heparin  Advance goals of care discussion: DNR/DNI  Family Communication: no family was present at bedside, at the time of interview.   Disposition:  Expected discharge date: 09/26/2015 Barriers to safe discharge: Further treatment for the acute on chronic hypoxic respiratory failure as well as CHF  Consultants: Pulmonary, cardiac thoracic surgery, cardiology Procedures: Right thoracentesis 09/20/2015 TEE 09/21/2015, echocardiogram April 13,   Antibiotics: Anti-infectives    None        Intake/Output Summary (Last 24 hours) at 09/24/15 1713 Last data filed at 09/24/15 1600  Gross per 24 hour  Intake   1430 ml  Output      0 ml  Net   1430 ml   Filed Weights    09/22/15 0500 09/23/15 0300 09/24/15 0417  Weight: 78.7 kg (173 lb 8 oz) 79 kg (174 lb 2.6 oz) 78.9 kg (173 lb 15.1 oz)    Objective: Physical Exam: Filed Vitals:   09/24/15 0936 09/24/15 1205 09/24/15 1549 09/24/15 1600  BP:  120/73 113/78 112/72  Pulse:  97 92 91  Temp:  98.3 F (36.8 C) 97.9 F (36.6 C)   TempSrc:  Oral Oral   Resp:  '13 13 17  '$ Height:      Weight:      SpO2: 94% 99% 97% 97%   General: Appear in Mild distress, no Rash; Oral Mucosa moist. Cardiovascular: S1 and S2 Present, aortic systolic Murmur,no JVD Respiratory: Bilateral Air entry present and bilateral Crackles, no wheezes Abdomen: Bowel Sound present, Soft and no tenderness Extremities: Improving bilateral  Pedal edema, no calf tenderness Neurology: Disoriented, no focal deficit  Data Reviewed: CBC:  Recent Labs Lab 09/20/15 0505 09/21/15 0355 09/22/15 0405 09/23/15 0432 09/24/15 0400  WBC 5.0 5.9 7.9 8.3 8.1  NEUTROABS  --   --  7.1  --  5.5  HGB 8.3* 8.5* 9.9* 10.1* 10.2*  HCT 31.9* 32.4* 37.4 37.2 38.0  MCV 78.4 77.9* 77.8* 77.5* 77.6*  PLT 205 202 241 254 098   Basic Metabolic Panel:  Recent Labs Lab 09/18/15 0445  09/21/15 0355  09/22/15 0405 09/23/15 0432 09/23/15 1359 09/23/15 2025 09/24/15 0400  NA 130*  < > 133*  < > 134* 132* 127* 130* 132*  K 3.8  < > 2.8*  < > 3.5 2.4* 5.6* 3.4* 2.8*  CL 76*  < > 68*  < > 66* 67* 67* 69* 75*  CO2 43*  < > >50*  < > >50* >50* 46* 46* 45*  GLUCOSE 212*  < > 206*  < > 226* 144* 202* 157* 127*  BUN 28*  < > 45*  < > 43* 53* 53* 64* 65*  CREATININE 1.12*  < > 1.58*  < > 1.45* 1.42* 1.44* 1.62* 1.39*  CALCIUM 9.0  < > 9.1  < > 9.8 9.9 9.8 9.9 9.5  MG 1.2*  --  1.4*  --  2.1 1.9  --   --  1.7  PHOS  --   --   --   --   --   --   --   --  3.9  < > = values in this interval not displayed. GFR: Estimated Creatinine Clearance: 39.3 mL/min (by C-G formula based on Cr of 1.39). Liver Function Tests:  Recent Labs Lab 09/20/15 1657  09/21/15 0355 09/22/15 0405 09/24/15 0400  AST  --  35 42*  --   ALT  --  21 29  --   ALKPHOS  --  53 68  --   BILITOT  --  1.3* 1.3*  --   PROT 6.5 7.1 8.0  --   ALBUMIN  --  3.4* 3.9 3.6   No results for input(s): LIPASE, AMYLASE in the last 168 hours. No results for input(s): AMMONIA in the last 168 hours. Coagulation Profile:  Recent Labs Lab 09/19/15 0432 09/24/15 0400  INR 1.34 1.23   Cardiac Enzymes: No results for input(s): CKTOTAL, CKMB, CKMBINDEX, TROPONINI in the last 168 hours. BNP (last 3 results) No results for input(s): PROBNP in the last 8760 hours.  CBG:  Recent Labs Lab 09/23/15 1638 09/23/15 2119 09/24/15 0732 09/24/15 1122 09/24/15 1632  GLUCAP 229* 166* 154* 347* 89   Lipid Profile: No results for input(s): CHOL, HDL, LDLCALC, TRIG, CHOLHDL, LDLDIRECT in the last 72 hours. Urine analysis:    Component Value Date/Time   COLORURINE YELLOW 09/13/2015 2230   APPEARANCEUR CLEAR 09/13/2015 2230   LABSPEC 1.031* 09/13/2015 2230   PHURINE 6.0 09/13/2015 2230   GLUCOSEU NEGATIVE 09/13/2015 2230   HGBUR NEGATIVE 09/13/2015 2230   HGBUR negative 08/04/2008 1526   BILIRUBINUR NEGATIVE 09/13/2015 2230   BILIRUBINUR n 11/24/2012 1335   KETONESUR NEGATIVE 09/13/2015 2230   PROTEINUR NEGATIVE 09/13/2015 2230   PROTEINUR trace 11/24/2012 1335   UROBILINOGEN 0.2 11/18/2014 0750   UROBILINOGEN 0.2 11/24/2012 1335   NITRITE NEGATIVE 09/13/2015 2230   NITRITE n 11/24/2012 1335   LEUKOCYTESUR NEGATIVE 09/13/2015 2230    Recent Results (from the past 240 hour(s))  MRSA PCR Screening     Status: None   Collection Time: 09/14/15  8:52 PM  Result Value Ref Range Status   MRSA by PCR NEGATIVE NEGATIVE Final    Comment:        The GeneXpert MRSA Assay (FDA approved for NASAL specimens only), is one component of a comprehensive MRSA colonization surveillance program. It is not intended to diagnose MRSA infection nor to guide or monitor treatment  for MRSA infections.   Body fluid culture     Status: None   Collection Time: 09/20/15  3:42 PM  Result Value Ref Range Status   Specimen Description PLEURAL RIGHT FLUID  Final   Special Requests Normal  Final   Gram Stain   Final    MODERATE WBC PRESENT,BOTH PMN AND MONONUCLEAR NO ORGANISMS SEEN    Culture NO GROWTH 3 DAYS  Final   Report  Status 09/24/2015 FINAL  Final   Studies: No results found.   Scheduled Meds: . acetaZOLAMIDE  250 mg Oral BID  . antiseptic oral rinse  7 mL Mouth Rinse BID  . aspirin  81 mg Oral Daily  . atorvastatin  20 mg Oral Daily  . darifenacin  7.5 mg Oral Daily  . DULoxetine  60 mg Oral Daily  . ferrous sulfate  325 mg Oral BID WC  . gabapentin  200 mg Oral TID  . guaiFENesin  600 mg Oral BID  . heparin subcutaneous  5,000 Units Subcutaneous Q8H  . insulin aspart  0-5 Units Subcutaneous QHS  . insulin aspart  0-9 Units Subcutaneous TID WC  . insulin aspart  3 Units Subcutaneous TID WC  . insulin glargine  5 Units Subcutaneous QHS  . lidocaine  1 patch Transdermal Q24H  . mometasone-formoterol  2 puff Inhalation BID  . pantoprazole  40 mg Oral Daily  . polyethylene glycol  17 g Oral Daily  . potassium chloride  40 mEq Oral BID WC  . senna-docusate  2 tablet Oral BID  . sodium chloride flush  3 mL Intravenous Q12H  . torsemide  40 mg Oral Daily  . vitamin B-12  1,000 mcg Oral Daily   Continuous Infusions:   PRN Meds: sodium chloride, ALPRAZolam, ipratropium-albuterol, ondansetron (ZOFRAN) IV, oxyCODONE-acetaminophen, sodium chloride flush, sodium chloride flush, traZODone  Time spent: 30 minutes  Author: Berle Mull, MD [facility] Pager: [phone] 09/24/2015 5:13 PM  If 7PM-7AM, please contact night-coverage at [email or web address], password [facility]

## 2015-09-25 ENCOUNTER — Inpatient Hospital Stay (HOSPITAL_COMMUNITY): Payer: Medicare Other

## 2015-09-25 DIAGNOSIS — E1165 Type 2 diabetes mellitus with hyperglycemia: Secondary | ICD-10-CM

## 2015-09-25 LAB — BASIC METABOLIC PANEL
Anion gap: 13 (ref 5–15)
BUN: 62 mg/dL — AB (ref 6–20)
CALCIUM: 9.7 mg/dL (ref 8.9–10.3)
CO2: 41 mmol/L — AB (ref 22–32)
CREATININE: 1.29 mg/dL — AB (ref 0.44–1.00)
Chloride: 80 mmol/L — ABNORMAL LOW (ref 101–111)
GFR calc Af Amer: 47 mL/min — ABNORMAL LOW (ref >60)
GFR, EST NON AFRICAN AMERICAN: 41 mL/min — AB (ref >60)
GLUCOSE: 123 mg/dL — AB (ref 65–99)
Potassium: 3.1 mmol/L — ABNORMAL LOW (ref 3.5–5.1)
Sodium: 134 mmol/L — ABNORMAL LOW (ref 135–145)

## 2015-09-25 LAB — GLUCOSE, CAPILLARY
Glucose-Capillary: 159 mg/dL — ABNORMAL HIGH (ref 65–99)
Glucose-Capillary: 226 mg/dL — ABNORMAL HIGH (ref 65–99)
Glucose-Capillary: 241 mg/dL — ABNORMAL HIGH (ref 65–99)
Glucose-Capillary: 227 mg/dL — ABNORMAL HIGH (ref 65–99)

## 2015-09-25 LAB — CBC
HEMATOCRIT: 40.6 % (ref 36.0–46.0)
Hemoglobin: 11.4 g/dL — ABNORMAL LOW (ref 12.0–15.0)
MCH: 22 pg — AB (ref 26.0–34.0)
MCHC: 28.1 g/dL — AB (ref 30.0–36.0)
MCV: 78.2 fL (ref 78.0–100.0)
PLATELETS: 305 10*3/uL (ref 150–400)
RBC: 5.19 MIL/uL — ABNORMAL HIGH (ref 3.87–5.11)
RDW: 19.7 % — AB (ref 11.5–15.5)
WBC: 9.1 10*3/uL (ref 4.0–10.5)

## 2015-09-25 LAB — CARBOXYHEMOGLOBIN
Carboxyhemoglobin: 2.3 % — ABNORMAL HIGH (ref 0.5–1.5)
Methemoglobin: 0.7 % (ref 0.0–1.5)
O2 SAT: 74.3 %
TOTAL HEMOGLOBIN: 11.2 g/dL — AB (ref 12.0–16.0)

## 2015-09-25 LAB — MAGNESIUM: Magnesium: 1.8 mg/dL (ref 1.7–2.4)

## 2015-09-25 LAB — ADENOSIDE DEAMINASE, PLEURAL FL: ADENOSIDE DEAMINASE, PLEURAL FL: 6.5 U/L (ref 0.0–9.4)

## 2015-09-25 MED ORDER — SPIRONOLACTONE 25 MG PO TABS
12.5000 mg | ORAL_TABLET | Freq: Every day | ORAL | Status: DC
  Administered 2015-09-25: 12.5 mg via ORAL
  Filled 2015-09-25: qty 1

## 2015-09-25 MED ORDER — GABAPENTIN 100 MG PO CAPS
100.0000 mg | ORAL_CAPSULE | Freq: Three times a day (TID) | ORAL | Status: DC
  Administered 2015-09-25 – 2015-09-28 (×10): 100 mg via ORAL
  Filled 2015-09-25 (×10): qty 1

## 2015-09-25 MED ORDER — POTASSIUM CHLORIDE CRYS ER 20 MEQ PO TBCR
40.0000 meq | EXTENDED_RELEASE_TABLET | Freq: Once | ORAL | Status: AC
  Administered 2015-09-25: 40 meq via ORAL
  Filled 2015-09-25: qty 2

## 2015-09-25 MED ORDER — INSULIN ASPART 100 UNIT/ML ~~LOC~~ SOLN
0.0000 [IU] | Freq: Every day | SUBCUTANEOUS | Status: DC
  Administered 2015-09-25: 2 [IU] via SUBCUTANEOUS
  Administered 2015-09-26: 3 [IU] via SUBCUTANEOUS

## 2015-09-25 MED ORDER — INSULIN GLARGINE 100 UNIT/ML ~~LOC~~ SOLN
10.0000 [IU] | Freq: Every day | SUBCUTANEOUS | Status: DC
  Administered 2015-09-25: 10 [IU] via SUBCUTANEOUS
  Filled 2015-09-25 (×2): qty 0.1

## 2015-09-25 MED ORDER — INSULIN ASPART 100 UNIT/ML ~~LOC~~ SOLN
0.0000 [IU] | Freq: Three times a day (TID) | SUBCUTANEOUS | Status: DC
  Administered 2015-09-25: 5 [IU] via SUBCUTANEOUS
  Administered 2015-09-26 (×2): 3 [IU] via SUBCUTANEOUS
  Administered 2015-09-26: 8 [IU] via SUBCUTANEOUS
  Administered 2015-09-27: 3 [IU] via SUBCUTANEOUS
  Administered 2015-09-27: 2 [IU] via SUBCUTANEOUS
  Administered 2015-09-27 – 2015-09-28 (×2): 3 [IU] via SUBCUTANEOUS
  Administered 2015-09-28: 2 [IU] via SUBCUTANEOUS

## 2015-09-25 MED ORDER — POTASSIUM CHLORIDE CRYS ER 20 MEQ PO TBCR
20.0000 meq | EXTENDED_RELEASE_TABLET | Freq: Two times a day (BID) | ORAL | Status: DC
  Administered 2015-09-25 – 2015-09-28 (×7): 20 meq via ORAL
  Filled 2015-09-25 (×7): qty 1

## 2015-09-25 MED ORDER — SENNOSIDES-DOCUSATE SODIUM 8.6-50 MG PO TABS
1.0000 | ORAL_TABLET | Freq: Every day | ORAL | Status: DC
  Administered 2015-09-25: 1 via ORAL
  Filled 2015-09-25 (×3): qty 1

## 2015-09-25 NOTE — Progress Notes (Signed)
[facility] Progress Note  Patient: Jillian Bright   PCP: [name], MD DOB: 1945-09-20   DOA: 09/07/2015   DOS: 09/25/2015   Date of Service: the patient was seen and examined on 09/25/2015 Outpatient Specialists: Vineet sood pulmonary Loralie Champagne cardiology  Subjective: The patient is more awake and oriented this morning. Denies having any acute complaint. No nausea no vomiting. Has cough with yellow-green discoloration. Nutrition: Tolerating oral diet  Brief hospital course: Patient was admitted on 09/07/2015, with complaint of worsening shortness of breath and orthopnea, was found to have acute on chronic diastolic CHF with significant volume overload. Patient was started on IV Lasix and was diuresed. Cardiology was consulted and the patient was started on dopamine after a PICC line insertion. Echogram 1 showed moderate to severe aortic stenosis and cardiac thoracic surgery was consulted. Later on pulmonary was consulted as well for suspected pulmonary nodule as well as pleural effusion. The patient underwent right thoracentesis with bloody pleural fluid 600 mL drained. On 09/21/2015 TEE showed moderate aortic stenosis. Pleural fluid cytology is negative for malignancy, culture is also negative. 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 Patient will need outpatient PET scan. Dobutamine has been stopped on 09/23/2015. Torsemide is started now.  Currently further plan is continue diuresis, and continue managing oxygen requirement  Assessment and Plan: 1. Acute on chronic diastolic (congestive) heart failure (HCC) Probably worsening due to noncompliance as an outpatient Cardiology as well as pulmonary has been consulted on the patient. Patient status post right pleural effusion thoracentesis. Initially was on dobutamine drip as well as Lasix infusion, now discontinued since 09/23/2015 Ins and outs  not inadequate due to lack of Foley catheter. Weight continues to go down. From 113 KG>> 79.9 KG. Renal function stable. Continue torsemide. Monitor daily weight and ins and outs Cardiology following, question is can he go up on torsemide and can we stop Diamox now that metabolic alkalosis is corrected.  2. COPD with chronic respiratory failure. Acute on chronic hypoxic respiratory failure, home oxygen 3 L  Right-sided pleural effusion. Exudative Left upper pulmonary nodule. Pulmonary was consulted and is currently following. Repeat CT scan shows evidence of atelectasis versus possible infiltrate although the patient is not exhibiting any signs of infection. Appreciate input. Continue incentive spirometry. Negative culture as well as cytology of the pleural fluid. ABG shows mixed respiratory acidosis and metabolic alkalosis, BiPAP when necessary ordered. Completed prednisone for 3 days.  Repeat chest x-ray on today. Currently requiring 6 L of nasal cannula to maintain saturation more than 94%. Maintain in stepdown unit. Flutter device as well as sputum culture ordered.  3. Aortic stenosis. Moderate to severe based on TEE. Cardiology feels that the patient would not be an ideal candidate for TAVR, since she has significant right-sided heart failure with severe TR and would not add any benefit from the surgery symptomatically.  4. Acute on chronic kidney injury. Chronic kidney disease stage II. Renal function mildly worsened during the hospitalization but improved after initiation of the dobutamine drip. Likely cardiorenal hemodynamics. Renal function continues to improve despite stopping dobutamine drip. Continue to monitor.  4. Hypokalemia. Replacing. Scheduled potassium 20 mg twice a day Hypomagnesemia. Resolved  5. Right-sided chest pain as well as right upper quadrant pain. LFTs are normal. Likely related to the pleural effusion and atelectasis. CT scan is not showing any  acute abnormality on the upper abdomen.  6. Microcytic  anemia. Likely associated with iron deficiency. We will initiate iron supplementation. Further GI evaluation as an outpatient. Status post PRBC on April 16.  7. Type 2 diabetes mellitus. Hemoglobin A1c 7.3 on 09/08/2015. Holding home metformin in the hospital. Also glipizide has been on hold. We will increase the sliding scale to moderate. Increase Lantus to 10 units. Stop scheduled pre-meal coverage.  8. Pain management. Acute encephalopathy Likely from fentanyl patch gradually improving. Also gradually tapering off the gabapentin from 300 mg 3 times a day to 100 mg 3 times a day.  9. Metabolic alkalosis Likely from aggressive diuresis. Significantly improving Also has chronic respiratory alkalosis at her baseline due to her COPD. Diamox has been added. Currently alkalosis is improving significantly and therefore will discuss with cardiology whether Diamox can be stopped.  10 vitamin B 12 deficiency. B-12 414,  Low normal side. Patient would benefit from supplementation.  Activity: physical therapy recommends SNF. Social worker on the case. Bowel regimen: last BM 09/23/2015. Bowel regimen initiated. Diet: Cardiac diet carb modified DVT Prophylaxis: subcutaneous Heparin  Advance goals of care discussion: DNR/DNI  Family Communication: no family was present at bedside, at the time of interview.   Disposition:  Expected discharge date: 09/27/2015 Barriers to safe discharge: Further treatment for the acute on chronic hypoxic respiratory failure as well as CHF  Consultants: Pulmonary, cardiac thoracic surgery, cardiology Procedures: Right thoracentesis 09/20/2015 TEE 09/21/2015, echocardiogram April 13,   Antibiotics: Anti-infectives    None        Intake/Output Summary (Last 24 hours) at 09/25/15 0951 Last data filed at 09/25/15 0600  Gross per 24 hour  Intake    920 ml  Output      2 ml  Net    918 ml    Filed Weights   09/23/15 0300 09/24/15 0417 09/25/15 0418  Weight: 79 kg (174 lb 2.6 oz) 78.9 kg (173 lb 15.1 oz) 79.9 kg (176 lb 2.4 oz)    Objective: Physical Exam: Filed Vitals:   09/25/15 0023 09/25/15 0418 09/25/15 0800 09/25/15 0915  BP: 116/77 147/85 113/71   Pulse: 88 93 96   Temp: 98.6 F (37 C) 96.7 F (35.9 C) 98.5 F (36.9 C)   TempSrc: Oral Axillary Oral   Resp: '16 12 16   '$ Height:      Weight:  79.9 kg (176 lb 2.4 oz)    SpO2: 95% 96% 100% 99%   General: Appear in Mild distress, no Rash; Oral Mucosa moist. Cardiovascular: S1 and S2 Present, aortic systolic Murmur,no JVD Respiratory: Bilateral Air entry present and bilateral Crackles, no wheezes Abdomen: Bowel Sound present, Soft and no tenderness Extremities: Improving bilateral  Pedal edema, no calf tenderness Neurology: No focal deficit, significantly oriented this morning  Data Reviewed: CBC:  Recent Labs Lab 09/21/15 0355 09/22/15 0405 09/23/15 0432 09/24/15 0400 09/25/15 0357  WBC 5.9 7.9 8.3 8.1 9.1  NEUTROABS  --  7.1  --  5.5  --   HGB 8.5* 9.9* 10.1* 10.2* 11.4*  HCT 32.4* 37.4 37.2 38.0 40.6  MCV 77.9* 77.8* 77.5* 77.6* 78.2  PLT 202 241 254 302 161   Basic Metabolic Panel:  Recent Labs Lab 09/21/15 0355  09/22/15 0405 09/23/15 0432 09/23/15 1359 09/23/15 2025 09/24/15 0400 09/24/15 1650 09/25/15 0357  NA 133*  < > 134* 132* 127* 130* 132* 133* 134*  K 2.8*  < > 3.5 2.4* 5.6* 3.4* 2.8* 3.4* 3.1*  CL 68*  < > 66* 67* 67* 69* 75*  76* 80*  CO2 >50*  < > >50* >50* 46* 46* 45* 43* 41*  GLUCOSE 206*  < > 226* 144* 202* 157* 127* 81 123*  BUN 45*  < > 43* 53* 53* 64* 65* 62* 62*  CREATININE 1.58*  < > 1.45* 1.42* 1.44* 1.62* 1.39* 1.41* 1.29*  CALCIUM 9.1  < > 9.8 9.9 9.8 9.9 9.5 9.6 9.7  MG 1.4*  --  2.1 1.9  --   --  1.7  --  1.8  PHOS  --   --   --   --   --   --  3.9  --   --   < > = values in this interval not displayed. GFR: Estimated Creatinine Clearance: 42.6 mL/min (by  C-G formula based on Cr of 1.29). Liver Function Tests:  Recent Labs Lab 09/20/15 1657 09/21/15 0355 09/22/15 0405 09/24/15 0400  AST  --  35 42*  --   ALT  --  21 29  --   ALKPHOS  --  53 68  --   BILITOT  --  1.3* 1.3*  --   PROT 6.5 7.1 8.0  --   ALBUMIN  --  3.4* 3.9 3.6   No results for input(s): LIPASE, AMYLASE in the last 168 hours. No results for input(s): AMMONIA in the last 168 hours. Coagulation Profile:  Recent Labs Lab 09/19/15 0432 09/24/15 0400  INR 1.34 1.23   Cardiac Enzymes: No results for input(s): CKTOTAL, CKMB, CKMBINDEX, TROPONINI in the last 168 hours. BNP (last 3 results) No results for input(s): PROBNP in the last 8760 hours.  CBG:  Recent Labs Lab 09/24/15 0732 09/24/15 1122 09/24/15 1632 09/24/15 2135 09/25/15 0832  GLUCAP 154* 347* 89 244* 159*   Lipid Profile: No results for input(s): CHOL, HDL, LDLCALC, TRIG, CHOLHDL, LDLDIRECT in the last 72 hours. Urine analysis:    Component Value Date/Time   COLORURINE YELLOW 09/13/2015 2230   APPEARANCEUR CLEAR 09/13/2015 2230   LABSPEC 1.031* 09/13/2015 2230   PHURINE 6.0 09/13/2015 2230   GLUCOSEU NEGATIVE 09/13/2015 2230   HGBUR NEGATIVE 09/13/2015 2230   HGBUR negative 08/04/2008 1526   BILIRUBINUR NEGATIVE 09/13/2015 2230   BILIRUBINUR n 11/24/2012 1335   KETONESUR NEGATIVE 09/13/2015 2230   PROTEINUR NEGATIVE 09/13/2015 2230   PROTEINUR trace 11/24/2012 1335   UROBILINOGEN 0.2 11/18/2014 0750   UROBILINOGEN 0.2 11/24/2012 1335   NITRITE NEGATIVE 09/13/2015 2230   NITRITE n 11/24/2012 1335   LEUKOCYTESUR NEGATIVE 09/13/2015 2230    Recent Results (from the past 240 hour(s))  Body fluid culture     Status: None   Collection Time: 09/20/15  3:42 PM  Result Value Ref Range Status   Specimen Description PLEURAL RIGHT FLUID  Final   Special Requests Normal  Final   Gram Stain   Final    MODERATE WBC PRESENT,BOTH PMN AND MONONUCLEAR NO ORGANISMS SEEN    Culture NO GROWTH 3  DAYS  Final   Report Status 09/24/2015 FINAL  Final   Studies: No results found.   Scheduled Meds: . acetaZOLAMIDE  250 mg Oral BID  . antiseptic oral rinse  7 mL Mouth Rinse BID  . aspirin  81 mg Oral Daily  . atorvastatin  20 mg Oral Daily  . darifenacin  7.5 mg Oral Daily  . DULoxetine  60 mg Oral Daily  . ferrous sulfate  325 mg Oral BID WC  . gabapentin  100 mg Oral TID  . guaiFENesin  600  mg Oral BID  . heparin subcutaneous  5,000 Units Subcutaneous Q8H  . insulin aspart  0-5 Units Subcutaneous QHS  . insulin aspart  0-9 Units Subcutaneous TID WC  . insulin aspart  3 Units Subcutaneous TID WC  . insulin glargine  5 Units Subcutaneous QHS  . lidocaine  1 patch Transdermal Q24H  . mometasone-formoterol  2 puff Inhalation BID  . pantoprazole  40 mg Oral Daily  . polyethylene glycol  17 g Oral Daily  . potassium chloride  20 mEq Oral BID  . senna-docusate  1 tablet Oral QHS  . sodium chloride flush  3 mL Intravenous Q12H  . torsemide  40 mg Oral Daily  . vitamin B-12  1,000 mcg Oral Daily   Continuous Infusions:   PRN Meds: sodium chloride, ALPRAZolam, ipratropium-albuterol, ondansetron (ZOFRAN) IV, oxyCODONE-acetaminophen, sodium chloride flush, sodium chloride flush  Time spent: 30 minutes  Author: Berle Mull, MD [facility] Pager: [phone] 09/25/2015 9:51 AM  If 7PM-7AM, please contact night-coverage at [email or web address], password [facility]

## 2015-09-25 NOTE — Progress Notes (Addendum)
Patient ID: Jillian Bright, female   DOB: Oct 31, 1945, 70 y.o.   MRN: [id number]   ADVANCED HF ROUNDING NOTE    Subjective:   70 year old female history of coronary artery disease, congestive heart failure, diabetes mellitus, oxygen dependent COPD, obstructive sleep apnea-intolerant to CPAP, pulmonary hypertension, obesity, hyperlipidemia. She was last seen by Dr. Aundra Dubin in August 2013. She initially presented with peripheral edema in 2009 and had a normal stress test and preserved EF on echocardiogram. She had a right heart catheterization which showed mild/moderate pulmonary hypertension which responded well to oxygen with decreased PA pressure. She had 2-D echocardiogram on 09/08/2015 revealing normal ejection fraction of 60-65% and wall motion. Grade 1 diastolic dysfunction. Moderate to severe aortic stenosis where previously was moderate. Mild MR. Left atrium mildly dilated. Peak PA pressure 61 mmHg. Right atrium moderately to severely dilated. Moderate tricuspid valve regurgitation.  Thoracentesis 4/25 with 600 cc bloody fluid out.   TEE (09/21/15) with normal LV size with D-shaped septum suggesting RV pressure/volume overload. LV EF 45-50%, diffuse hypokinesis. Severely dilated RV with mild to moderate systolic dysfunction. Mild left atrial enlargement, no LA appendage thrombus. Moderate to severe right atrial enlargement. There was moderate to severe tricuspid regurgitation. Mild to moderate mitral regurgitation. The aortic valve was trileaflet with severe calcification. There was moderate to severe aortic stenosis with mean gradient 37 mmHg and AVA 1.0 cm^2.   09/13/15 CTA: negative for PE, enlarging lung nodule noted.  Remains confused. Co-ox 74% Creatinine improved at 1.29. Denies dyspnea. Weight down 30 pounds this admission. (up 3 pounds overnight) CVP checked personally 4-5.    Objective:   Weight Range:  Vital Signs:   Temp:  [96.7 F (35.9 C)-98.6 F (37 C)] 97.9 F  (36.6 C) (04/30 1200) Pulse Rate:  [88-96] 91 (04/30 1200) Resp:  [10-17] 14 (04/30 1200) BP: (82-147)/(57-88) 108/73 mmHg (04/30 1200) SpO2:  [92 %-100 %] 99 % (04/30 1200) Weight:  [79.9 kg (176 lb 2.4 oz)] 79.9 kg (176 lb 2.4 oz) (04/30 0418) Last BM Date: 09/24/15  Weight change: Filed Weights   09/23/15 0300 09/24/15 0417 09/25/15 0418  Weight: 79 kg (174 lb 2.6 oz) 78.9 kg (173 lb 15.1 oz) 79.9 kg (176 lb 2.4 oz)    Intake/Output:   Intake/Output Summary (Last 24 hours) at 09/25/15 1601 Last data filed at 09/25/15 1500  Gross per 24 hour  Intake    970 ml  Output   1652 ml  Net   -682 ml     Physical Exam: General: NAD. No resp difficulty. Lying flat in  bed.  HEENT: normal Neck: supple. JVP 5 cm. Carotids 2+ bilat; no bruits. No lymphadenopathy or thryomegaly appreciated. Cor: PMI nondisplaced. Regular rate & rhythm. No rubs, gallops.  3/6 crescendo-decrescendo murmur RUSB with muffled S2. Lungs: RML RLL decreased. On 3 liters oxygen Abdomen: soft, nontender, nondistended. No hepatosplenomegaly. No bruits or masses. Good bowel sounds. Extremities: no cyanosis, clubbing, rash.  trace Edema  SCDs in place Neuro: confused , cranial nerves grossly intact. moves all 4 extremities w/o difficulty. Affect pleasant  Telemetry: ST 90-100s   Labs: Basic Metabolic Panel:  Recent Labs Lab 09/21/15 0355  09/22/15 0405 09/23/15 0432 09/23/15 1359 09/23/15 2025 09/24/15 0400 09/24/15 1650 09/25/15 0357  NA 133*  < > 134* 132* 127* 130* 132* 133* 134*  K 2.8*  < > 3.5 2.4* 5.6* 3.4* 2.8* 3.4* 3.1*  CL 68*  < > 66* 67* 67* 69* 75* 76* 80*  CO2 >  50*  < > >50* >50* 46* 46* 45* 43* 41*  GLUCOSE 206*  < > 226* 144* 202* 157* 127* 81 123*  BUN 45*  < > 43* 53* 53* 64* 65* 62* 62*  CREATININE 1.58*  < > 1.45* 1.42* 1.44* 1.62* 1.39* 1.41* 1.29*  CALCIUM 9.1  < > 9.8 9.9 9.8 9.9 9.5 9.6 9.7  MG 1.4*  --  2.1 1.9  --   --  1.7  --  1.8  PHOS  --   --   --   --   --   --  3.9   --   --   < > = values in this interval not displayed.  Liver Function Tests:  Recent Labs Lab 09/20/15 1657 09/21/15 0355 09/22/15 0405 09/24/15 0400  AST  --  35 42*  --   ALT  --  21 29  --   ALKPHOS  --  53 68  --   BILITOT  --  1.3* 1.3*  --   PROT 6.5 7.1 8.0  --   ALBUMIN  --  3.4* 3.9 3.6   No results for input(s): LIPASE, AMYLASE in the last 168 hours. No results for input(s): AMMONIA in the last 168 hours.  CBC:  Recent Labs Lab 09/21/15 0355 09/22/15 0405 09/23/15 0432 09/24/15 0400 09/25/15 0357  WBC 5.9 7.9 8.3 8.1 9.1  NEUTROABS  --  7.1  --  5.5  --   HGB 8.5* 9.9* 10.1* 10.2* 11.4*  HCT 32.4* 37.4 37.2 38.0 40.6  MCV 77.9* 77.8* 77.5* 77.6* 78.2  PLT 202 241 254 302 305    Cardiac Enzymes: No results for input(s): CKTOTAL, CKMB, CKMBINDEX, TROPONINI in the last 168 hours.  BNP: BNP (last 3 results)  Recent Labs  11/18/14 1015 09/07/15 2000  BNP 176.5* 848.6*    ProBNP (last 3 results) No results for input(s): PROBNP in the last 8760 hours.    Other results:  Imaging: Dg Chest 2 View  09/25/2015  CLINICAL DATA:  Cough; right pleural effusion; H/o DM, COPD, and MI; former smoker EXAM: CHEST  2 VIEW COMPARISON:  09/21/2015 FINDINGS: Stable right PICC line. Cardiac enlargement stable. Hazy airspace disease right lower lobe again identified, improved when compared to prior study. Mild left lower lobe atelectasis. Vascular pattern within normal limits. No evidence of pulmonary edema. IMPRESSION: Persistent but improved right base airspace disease. Electronically Signed   By: Skipper Cliche M.D.   On: 09/25/2015 15:50     Medications:     Scheduled Medications: . acetaZOLAMIDE  250 mg Oral BID  . antiseptic oral rinse  7 mL Mouth Rinse BID  . aspirin  81 mg Oral Daily  . atorvastatin  20 mg Oral Daily  . darifenacin  7.5 mg Oral Daily  . DULoxetine  60 mg Oral Daily  . ferrous sulfate  325 mg Oral BID WC  . gabapentin  100 mg Oral  TID  . guaiFENesin  600 mg Oral BID  . heparin subcutaneous  5,000 Units Subcutaneous Q8H  . insulin aspart  0-15 Units Subcutaneous TID WC  . insulin aspart  0-5 Units Subcutaneous QHS  . insulin glargine  10 Units Subcutaneous QHS  . lidocaine  1 patch Transdermal Q24H  . mometasone-formoterol  2 puff Inhalation BID  . pantoprazole  40 mg Oral Daily  . polyethylene glycol  17 g Oral Daily  . potassium chloride  20 mEq Oral BID  . senna-docusate  1 tablet  Oral QHS  . sodium chloride flush  3 mL Intravenous Q12H  . torsemide  40 mg Oral Daily  . vitamin B-12  1,000 mcg Oral Daily    Infusions:    PRN Medications: sodium chloride, ALPRAZolam, ipratropium-albuterol, ondansetron (ZOFRAN) IV, oxyCODONE-acetaminophen, sodium chloride flush, sodium chloride flush   Assessment/Plan/Discussion     1. 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 TEE 4/27 with EF 45-50%, D-shaped interventricular septum, severely dilated RV with mild to moderate systolic dysfunction, moderate to severe AS.  She was started on dobutamine for RV support.  Volume status is now considerably improved.  CVP down to 5 this morning. - Continue po torsemide.  Replete KCL. - Continue acetazolamide for marked respiratory alkalosis, hopefully will improve now that we are not diuresing her as aggressively.    - Off dobutamine Friday 4/28  2. Aortic stenosis: TEE done, AS is moderate to severe by numbers with mean gradient 37 and AVA 1.0 cm^2 => looking at valve, probably severe.  Dr. Aundra Dubin reviewed her TEE with Dr Burt Knack.  While we suspect that the valve is hemodynamically significant, her main problem here seems to be severe right heart failure.  She also has moderate to probably severe TR.  The right heart problems will likely not go away with TAVR, and we would be exposing her to a high risk procedure (may not be able to come off vent). For now, I agree with medical management of  her RV failure.   3. Pulmonary hypertension: She has a history of pulmonary hypertension thought to be due to OHS/OSA. Suspect her [place] currently is a combination of OHS/OSA and pulmonary venous hypertension from volume overload.  - Volume overload improved.   4. OHS/OSA: She is on high dose home O2 chronically. Had not been able to tolerate CPAP in the past but doing ok with it here.  Will have her wear CPAP at night.   5. COPD: On home O2, no longer smokes.   6. CAD: h/o RCA PCI. Stable.  Continue ASA 81 and statin.   7. Tricuspid regurgitation: Moderate to probably severe in setting of severe RV dilation.   8. Pulmonary nodule: Enlarging pulmonary nodule concerning for bronchogenic carcinoma, especially with smoking history. This will have to be addressed after her heart issues are stabilized. Will need PET scan after cardiac issues stabilized (must be done as outpatient).  - Thoracentesis on 4/25, bloody fluid sent for cytology, fluid negative for malignant cells.  9. AKI on CKD: Creatinine improved at 1.29 today.  Follow closely.   10. Alkalosis: Primarily due to chronic CO2 retention +/- diuresis. Continue acetazolamide today at 250 mg bid.  Hopefully will improve with less aggressive diuresis.     11. Hypokalemia: Continue to supp. Add spiro.   12. Delirium: Confusion at times, worse at night.   13. Disposition: Will need SNF. She is DNR/DNI.     Glori Bickers MD 09/25/2015 4:01 PM

## 2015-09-26 DIAGNOSIS — R0902 Hypoxemia: Secondary | ICD-10-CM

## 2015-09-26 DIAGNOSIS — I350 Nonrheumatic aortic (valve) stenosis: Secondary | ICD-10-CM

## 2015-09-26 DIAGNOSIS — R911 Solitary pulmonary nodule: Secondary | ICD-10-CM | POA: Insufficient documentation

## 2015-09-26 DIAGNOSIS — J441 Chronic obstructive pulmonary disease with (acute) exacerbation: Secondary | ICD-10-CM

## 2015-09-26 LAB — COMPREHENSIVE METABOLIC PANEL
ALT: 17 U/L (ref 14–54)
AST: 23 U/L (ref 15–41)
Albumin: 3.6 g/dL (ref 3.5–5.0)
Alkaline Phosphatase: 63 U/L (ref 38–126)
Anion gap: 13 (ref 5–15)
BUN: 49 mg/dL — ABNORMAL HIGH (ref 6–20)
CHLORIDE: 86 mmol/L — AB (ref 101–111)
CO2: 37 mmol/L — AB (ref 22–32)
Calcium: 9.8 mg/dL (ref 8.9–10.3)
Creatinine, Ser: 1.19 mg/dL — ABNORMAL HIGH (ref 0.44–1.00)
GFR, EST AFRICAN AMERICAN: 52 mL/min — AB (ref >60)
GFR, EST NON AFRICAN AMERICAN: 45 mL/min — AB (ref >60)
Glucose, Bld: 133 mg/dL — ABNORMAL HIGH (ref 65–99)
POTASSIUM: 3.2 mmol/L — AB (ref 3.5–5.1)
SODIUM: 136 mmol/L (ref 135–145)
Total Bilirubin: 0.9 mg/dL (ref 0.3–1.2)
Total Protein: 7.6 g/dL (ref 6.5–8.1)

## 2015-09-26 LAB — MAGNESIUM
MAGNESIUM: 3.1 mg/dL — AB (ref 1.7–2.4)
Magnesium: 1.6 mg/dL — ABNORMAL LOW (ref 1.7–2.4)

## 2015-09-26 LAB — CBC WITH DIFFERENTIAL/PLATELET
BASOS ABS: 0 10*3/uL (ref 0.0–0.1)
Basophils Relative: 0 %
EOS PCT: 3 %
Eosinophils Absolute: 0.3 10*3/uL (ref 0.0–0.7)
HCT: 43.4 % (ref 36.0–46.0)
Hemoglobin: 11.9 g/dL — ABNORMAL LOW (ref 12.0–15.0)
LYMPHS PCT: 20 %
Lymphs Abs: 1.8 10*3/uL (ref 0.7–4.0)
MCH: 21.3 pg — AB (ref 26.0–34.0)
MCHC: 27.4 g/dL — ABNORMAL LOW (ref 30.0–36.0)
MCV: 77.8 fL — AB (ref 78.0–100.0)
Monocytes Absolute: 1 10*3/uL (ref 0.1–1.0)
Monocytes Relative: 12 %
Neutro Abs: 5.9 10*3/uL (ref 1.7–7.7)
Neutrophils Relative %: 65 %
PLATELETS: 295 10*3/uL (ref 150–400)
RBC: 5.58 MIL/uL — ABNORMAL HIGH (ref 3.87–5.11)
RDW: 20.1 % — AB (ref 11.5–15.5)
WBC: 9.1 10*3/uL (ref 4.0–10.5)

## 2015-09-26 LAB — BASIC METABOLIC PANEL
ANION GAP: 11 (ref 5–15)
BUN: 44 mg/dL — ABNORMAL HIGH (ref 6–20)
CO2: 32 mmol/L (ref 22–32)
Calcium: 9.4 mg/dL (ref 8.9–10.3)
Chloride: 87 mmol/L — ABNORMAL LOW (ref 101–111)
Creatinine, Ser: 1.41 mg/dL — ABNORMAL HIGH (ref 0.44–1.00)
GFR calc Af Amer: 42 mL/min — ABNORMAL LOW (ref >60)
GFR, EST NON AFRICAN AMERICAN: 37 mL/min — AB (ref >60)
Glucose, Bld: 163 mg/dL — ABNORMAL HIGH (ref 65–99)
POTASSIUM: 3.7 mmol/L (ref 3.5–5.1)
SODIUM: 130 mmol/L — AB (ref 135–145)

## 2015-09-26 LAB — GLUCOSE, CAPILLARY
GLUCOSE-CAPILLARY: 175 mg/dL — AB (ref 65–99)
Glucose-Capillary: 168 mg/dL — ABNORMAL HIGH (ref 65–99)
Glucose-Capillary: 270 mg/dL — ABNORMAL HIGH (ref 65–99)

## 2015-09-26 MED ORDER — TRAMADOL HCL 50 MG PO TABS: 50.0000 mg | ORAL_TABLET | Freq: Two times a day (BID) | ORAL | Status: DC | PRN

## 2015-09-26 MED ORDER — ACETAMINOPHEN 325 MG PO TABS
650.0000 mg | ORAL_TABLET | Freq: Four times a day (QID) | ORAL | Status: DC | PRN
  Administered 2015-09-27: 650 mg via ORAL
  Filled 2015-09-26: qty 2

## 2015-09-26 MED ORDER — POTASSIUM CHLORIDE CRYS ER 20 MEQ PO TBCR
40.0000 meq | EXTENDED_RELEASE_TABLET | Freq: Once | ORAL | Status: AC
  Administered 2015-09-26: 40 meq via ORAL
  Filled 2015-09-26: qty 2

## 2015-09-26 MED ORDER — INSULIN GLARGINE 100 UNIT/ML ~~LOC~~ SOLN
15.0000 [IU] | Freq: Every day | SUBCUTANEOUS | Status: DC
  Administered 2015-09-26 – 2015-09-27 (×2): 15 [IU] via SUBCUTANEOUS
  Filled 2015-09-26 (×4): qty 0.15

## 2015-09-26 MED ORDER — SPIRONOLACTONE 25 MG PO TABS
25.0000 mg | ORAL_TABLET | Freq: Every day | ORAL | Status: DC
  Administered 2015-09-26 – 2015-09-28 (×3): 25 mg via ORAL
  Filled 2015-09-26 (×3): qty 1

## 2015-09-26 MED ORDER — MAGNESIUM SULFATE 4 GM/100ML IV SOLN
4.0000 g | Freq: Once | INTRAVENOUS | Status: AC
  Administered 2015-09-26: 4 g via INTRAVENOUS
  Filled 2015-09-26: qty 100

## 2015-09-26 NOTE — Progress Notes (Signed)
[facility] Progress Note  Patient: Jillian Bright MWN:[id number]   PCP: [name], MD DOB: 23-Jul-1945   DOA: 09/07/2015   DOS: 09/26/2015   Date of Service: the patient was seen and examined on 09/26/2015 Outpatient Specialists: Vineet sood pulmonary Loralie Champagne cardiology  Subjective: Denies any acute complaint. Breathing is better. No cough. No nausea no vomiting. No diarrhea. No abdominal pain confusion is significantly improved and appears to be at her baseline Nutrition: Tolerating oral diet  Brief hospital course: Patient was admitted on 09/07/2015, with complaint of worsening shortness of breath and orthopnea, was found to have acute on chronic diastolic CHF with significant volume overload. Patient was started on IV Lasix and was diuresed. Cardiology was consulted and the patient was started on dopamine after a PICC line insertion. Echogram 1 showed moderate to severe aortic stenosis and cardiac thoracic surgery was consulted. Later on pulmonary was consulted as well for suspected pulmonary nodule as well as pleural effusion. The patient underwent right thoracentesis with bloody pleural fluid 600 mL drained. On 09/21/2015 TEE showed moderate aortic stenosis. Pleural fluid cytology is negative for malignancy, culture is also negative. 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 Patient will need outpatient PET scan. Dobutamine has been stopped on 09/23/2015. Torsemide is started now.  Currently further plan is continue diuresis, and continue managing oxygen requirement  Assessment and Plan: 1. Acute on chronic diastolic (congestive) heart failure (HCC) Probably worsening due to noncompliance as an outpatient Cardiology as well as pulmonary has been consulted on the patient. Patient status post right pleural effusion thoracentesis. Initially was on dobutamine drip as well as Lasix infusion, now discontinued  since 09/23/2015 Ins and outs not inadequate due to lack of Foley catheter. Weight continues to go down. From 113 KG>> 79.9 KG. Renal function stable. Torsemide on hold today and will likely resume on 09/27/2015 Monitor daily weight and ins and outs. Appreciate cardiology input. Patient is also started on Aldactone.  2. COPD with chronic respiratory failure. Acute on chronic hypoxic respiratory failure, home oxygen 3 L  Right-sided pleural effusion. Exudative Left upper pulmonary nodule. Pulmonary was consulted and is currently following. Repeat CT scan shows evidence of atelectasis versus possible infiltrate although the patient is not exhibiting any signs of infection. Appreciate input. Continue incentive spirometry. Negative culture as well as cytology of the pleural fluid. ABG shows mixed respiratory acidosis and metabolic alkalosis, BiPAP when necessary ordered. Completed prednisone for 3 days. Flutter device as well as sputum culture ordered. Repeat chest x-ray on 09/25/2015 does not show any reaccumulation of the fluid Currently requiring 4 L of nasal cannula to maintain saturation more than 94%. Pulmonary has also signed off.  Transfer to telemetry and likely discharge on 09/27/2015 if remains stable    3. Aortic stenosis. Moderate to severe based on TEE. Cardiology feels that the patient would not be an ideal candidate for TAVR, since she has significant right-sided heart failure with severe TR and would not add any benefit from the surgery symptomatically. Appreciate input from cardiac thoracic surgery who would like to medically manage the patient. Also patient's left pulmonary nodule requiring further workup and possibility of bronchogenic carcinoma would also limit feasibility of any surgical or interventional procedure for aortic stenosis.  4. Acute on chronic kidney injury. Chronic kidney disease stage II. Renal function mildly worsened during the hospitalization but  improved after initiation of the dobutamine drip. Likely cardiorenal  hemodynamics. Renal function continues to improve despite stopping dobutamine drip. Continue to monitor.  4. Hypokalemia. Replacing. Scheduled potassium 20 mg twice a day Hypomagnesemia. Resolved  5. Right-sided chest pain as well as right upper quadrant pain. LFTs are normal. Likely related to the pleural effusion and atelectasis. CT scan is not showing any acute abnormality on the upper abdomen.  6. Microcytic anemia. Likely associated with iron deficiency. We will initiate iron supplementation. Further GI evaluation as an outpatient. Status post PRBC on April 16.  7. Type 2 diabetes mellitus. Hemoglobin A1c 7.3 on 09/08/2015. Holding home metformin in the hospital. Also glipizide has been on hold. We will increase the sliding scale to moderate. With fasting blood sugar 168 increase Lantus to 15 units Stop scheduled pre-meal coverage.  8. Pain management. Acute encephalopathy Likely from fentanyl patch gradually improving. Also gradually tapering off the gabapentin from 300 mg 3 times a day to 100 mg 3 times a day.  9. Metabolic alkalosis Likely from aggressive diuresis. Almost resolved Also has chronic respiratory compensatory alkalosis at her baseline due to her COPD. Diamox was initially used but since now metabolic alkalosis has resolved it is discontinued.  10 vitamin B 12 deficiency. B-12 414,  Low normal side. Patient would benefit from supplementation.  Activity: physical therapy recommends SNF. Social worker on the case. Bowel regimen: last BM 09/24/2015. Bowel regimen initiated. Diet: Cardiac diet carb modified, heart healthy DVT Prophylaxis: subcutaneous Heparin  Advance goals of care discussion: DNR/DNI  Family Communication: no family was present at bedside, at the time of interview.   Disposition:  Expected discharge date: 09/27/2015 Barriers to safe discharge: Stabilization on  oral therapy.  Consultants: Pulmonary, cardiothoracic surgery, cardiology Procedures: Right thoracentesis 09/20/2015 TEE 09/21/2015, echocardiogram April 13,   Antibiotics: Anti-infectives    None        Intake/Output Summary (Last 24 hours) at 09/26/15 1742 Last data filed at 09/26/15 1617  Gross per 24 hour  Intake   1080 ml  Output   1767 ml  Net   -687 ml   Filed Weights   09/24/15 0417 09/25/15 0418 09/26/15 0400  Weight: 78.9 kg (173 lb 15.1 oz) 79.9 kg (176 lb 2.4 oz) 76.8 kg (169 lb 5 oz)    Objective: Physical Exam: Filed Vitals:   09/26/15 1019 09/26/15 1121 09/26/15 1200 09/26/15 1639  BP:  113/76 116/83 121/78  Pulse:  98 94 95  Temp:  97.7 F (36.5 C)  98 F (36.7 C)  TempSrc:  Oral  Oral  Resp:  '12 17 12  '$ Height:      Weight:      SpO2: 95% 98% 99% 98%   General: Appear in Mild distress, no Rash; Oral Mucosa moist. Cardiovascular: S1 and S2 Present, aortic systolic Murmur,no JVD Respiratory: Bilateral Air entry present and Resolving Crackles, no wheezes Abdomen: Bowel Sound present, Soft and no tenderness Extremities: Trace bilateral  Pedal edema, no calf tenderness Neurology: No focal deficit,  Data Reviewed: CBC:  Recent Labs Lab 09/22/15 0405 09/23/15 0432 09/24/15 0400 09/25/15 0357 09/26/15 0610  WBC 7.9 8.3 8.1 9.1 9.1  NEUTROABS 7.1  --  5.5  --  5.9  HGB 9.9* 10.1* 10.2* 11.4* 11.9*  HCT 37.4 37.2 38.0 40.6 43.4  MCV 77.8* 77.5* 77.6* 78.2 77.8*  PLT 241 254 302 305 726   Basic Metabolic Panel:  Recent Labs Lab 09/22/15 0405 09/23/15 0432  09/23/15 2025 09/24/15 0400 09/24/15 1650 09/25/15 0357 09/26/15 0610  NA 134*  132*  < > 130* 132* 133* 134* 136  K 3.5 2.4*  < > 3.4* 2.8* 3.4* 3.1* 3.2*  CL 66* 67*  < > 69* 75* 76* 80* 86*  CO2 >50* >50*  < > 46* 45* 43* 41* 37*  GLUCOSE 226* 144*  < > 157* 127* 81 123* 133*  BUN 43* 53*  < > 64* 65* 62* 62* 49*  CREATININE 1.45* 1.42*  < > 1.62* 1.39* 1.41* 1.29* 1.19*    CALCIUM 9.8 9.9  < > 9.9 9.5 9.6 9.7 9.8  MG 2.1 1.9  --   --  1.7  --  1.8 1.6*  PHOS  --   --   --   --  3.9  --   --   --   < > = values in this interval not displayed. GFR: Estimated Creatinine Clearance: 45.4 mL/min (by C-G formula based on Cr of 1.19). Liver Function Tests:  Recent Labs Lab 09/20/15 1657 09/21/15 0355 09/22/15 0405 09/24/15 0400 09/26/15 0610  AST  --  35 42*  --  23  ALT  --  21 29  --  17  ALKPHOS  --  53 68  --  63  BILITOT  --  1.3* 1.3*  --  0.9  PROT 6.5 7.1 8.0  --  7.6  ALBUMIN  --  3.4* 3.9 3.6 3.6   No results for input(s): LIPASE, AMYLASE in the last 168 hours. No results for input(s): AMMONIA in the last 168 hours. Coagulation Profile:  Recent Labs Lab 09/24/15 0400  INR 1.23   Cardiac Enzymes: No results for input(s): CKTOTAL, CKMB, CKMBINDEX, TROPONINI in the last 168 hours. BNP (last 3 results) No results for input(s): PROBNP in the last 8760 hours.  CBG:  Recent Labs Lab 09/25/15 1600 09/25/15 2137 09/26/15 0803 09/26/15 1126 09/26/15 1722  GLUCAP 241* 227* 168* 270* 175*   Lipid Profile: No results for input(s): CHOL, HDL, LDLCALC, TRIG, CHOLHDL, LDLDIRECT in the last 72 hours. Urine analysis:    Component Value Date/Time   COLORURINE YELLOW 09/13/2015 2230   APPEARANCEUR CLEAR 09/13/2015 2230   LABSPEC 1.031* 09/13/2015 2230   PHURINE 6.0 09/13/2015 2230   GLUCOSEU NEGATIVE 09/13/2015 2230   HGBUR NEGATIVE 09/13/2015 2230   HGBUR negative 08/04/2008 1526   BILIRUBINUR NEGATIVE 09/13/2015 2230   BILIRUBINUR n 11/24/2012 1335   KETONESUR NEGATIVE 09/13/2015 2230   PROTEINUR NEGATIVE 09/13/2015 2230   PROTEINUR trace 11/24/2012 1335   UROBILINOGEN 0.2 11/18/2014 0750   UROBILINOGEN 0.2 11/24/2012 1335   NITRITE NEGATIVE 09/13/2015 2230   NITRITE n 11/24/2012 1335   LEUKOCYTESUR NEGATIVE 09/13/2015 2230    Recent Results (from the past 240 hour(s))  Body fluid culture     Status: None   Collection Time:  09/20/15  3:42 PM  Result Value Ref Range Status   Specimen Description PLEURAL RIGHT FLUID  Final   Special Requests Normal  Final   Gram Stain   Final    MODERATE WBC PRESENT,BOTH PMN AND MONONUCLEAR NO ORGANISMS SEEN    Culture NO GROWTH 3 DAYS  Final   Report Status 09/24/2015 FINAL  Final   Studies: No results found.   Scheduled Meds: . antiseptic oral rinse  7 mL Mouth Rinse BID  . aspirin  81 mg Oral Daily  . atorvastatin  20 mg Oral Daily  . darifenacin  7.5 mg Oral Daily  . DULoxetine  60 mg Oral Daily  . ferrous sulfate  325 mg Oral BID WC  . gabapentin  100 mg Oral TID  . guaiFENesin  600 mg Oral BID  . heparin subcutaneous  5,000 Units Subcutaneous Q8H  . insulin aspart  0-15 Units Subcutaneous TID WC  . insulin aspart  0-5 Units Subcutaneous QHS  . insulin glargine  10 Units Subcutaneous QHS  . mometasone-formoterol  2 puff Inhalation BID  . pantoprazole  40 mg Oral Daily  . polyethylene glycol  17 g Oral Daily  . potassium chloride  20 mEq Oral BID  . senna-docusate  1 tablet Oral QHS  . sodium chloride flush  3 mL Intravenous Q12H  . spironolactone  25 mg Oral Daily  . vitamin B-12  1,000 mcg Oral Daily   Continuous Infusions:   PRN Meds: sodium chloride, acetaminophen, ALPRAZolam, ipratropium-albuterol, ondansetron (ZOFRAN) IV, sodium chloride flush, sodium chloride flush, traMADol  Time spent: 30 minutes  Author: Berle Mull, MD [facility] Pager: [phone] 09/26/2015 5:42 PM  If 7PM-7AM, please contact night-coverage at [email or web address], password [facility]

## 2015-09-26 NOTE — Progress Notes (Signed)
Physical Therapy Treatment Patient Details Name: Jillian Bright MRN: [id number] DOB: 09/30/1945 Today's Date: 09/26/2015    History of Present Illness Jillian Bright is a 70 y.o. female has a past medical history of Diabetes mellitus; COPD (chronic obstructive pulmonary disease); Sleep apnea, obstructive; Pulmonary HTN ; Obesity; Hyperlipidemia; Myocardial infarction; Coronary artery disease; Anxiety; Shortness of breath; GERD (gastroesophageal reflux disease); Arthritis; CHF (congestive heart failure); Pneumonia; Bronchitis; and History of hiatal hernia. Admitted for Acute on chronic diastolic CHF. CT scan negative for PE, + Left upper lobe pulmonary nodule; decr respiratory status with move to stepdown unit 4/19. RLL effusion with thoracentesis 4/25; encephalopathy    PT Comments    Patient much less confused, although continues to have decreased safety awareness and awareness of deficits. Was able to ambulate up to 20 ft with RW and min assist, however with poor awareness of her level of fatigue and had to bring chair to her to sit down.    Follow Up Recommendations  SNF;Supervision/Assistance - 24 hour     Equipment Recommendations  None recommended by PT    Recommendations for Other Services OT consult     Precautions / Restrictions Precautions Precautions: Fall Restrictions Weight Bearing Restrictions: No    Mobility  Bed Mobility Overal bed mobility: Needs Assistance Bed Mobility: Supine to Sit     Supine to sit: Min guard;HOB elevated        Transfers Overall transfer level: Needs assistance Equipment used: Rolling walker (2 wheeled) Transfers: Sit to/from Stand Sit to Stand: Min assist         General transfer comment: stood x 3 (including from standard toilet); vc for sequencing with RW (and recalled on next attempt)  Ambulation/Gait Ambulation/Gait assistance: Min assist;+2 safety/equipment Ambulation Distance (Feet): 20 Feet (seated rest;  10) Assistive device: Rolling walker (2 wheeled) Gait Pattern/deviations: Step-through pattern;Decreased stride length;Wide base of support Gait velocity: decreased   General Gait Details: ambulated to bathroom, slowly and slightly unsteady even with RW   Stairs            Wheelchair Mobility    Modified Rankin (Stroke Patients Only)       Balance Overall balance assessment: Needs assistance Sitting-balance support: No upper extremity supported;Feet supported Sitting balance-Leahy Scale: Fair     Standing balance support: No upper extremity supported;During functional activity (washing hands) Standing balance-Leahy Scale: Poor Standing balance comment: stood at sink x 3 minutes with perseverating on handwashing; became unsteady with staggering step x 2 and still required max cues to sit down                    Cognition Arousal/Alertness: Awake/alert Behavior During Therapy: WFL for tasks assessed/performed Overall Cognitive Status: Impaired/Different from baseline Area of Impairment: Memory;Safety/judgement;Following commands;Awareness;Attention Orientation Level:  (fully oriented!) Current Attention Level: Selective (with many distractions in room) Memory: Decreased short-term memory Following Commands: Follows one step commands consistently;Follows multi-step commands with increased time Safety/Judgement: Decreased awareness of deficits;Decreased awareness of safety Awareness: Intellectual Problem Solving: Slow processing;Requires verbal cues;Requires tactile cues General Comments: could state that she was getting dizzy and feeling weak, yet perseverated on washing hands at sink with max cues to sit down    Exercises General Exercises - Lower Extremity Ankle Circles/Pumps: AROM;Both;10 reps Quad Sets: AROM;Both;10 reps    General Comments        Pertinent Vitals/Pain Pain Assessment: Faces Faces Pain Scale: Hurts little more Pain Location: lower  legs (anterior and posterior) Pain Descriptors /  Indicators: Discomfort;Tightness Pain Intervention(s): Limited activity within patient's tolerance;Monitored during session;Repositioned;Other (comment) (ROM/stretching prior to weight bearing)    Home Living                      Prior Function            PT Goals (current goals can now be found in the care plan section) Acute Rehab PT Goals Patient Stated Goal: unable to state due to confusion Time For Goal Achievement: 10/06/15 Progress towards PT goals: Progressing toward goals    Frequency  Min 3X/week (much less confused; from home alone)    PT Plan Frequency needs to be updated    Co-evaluation             End of Session Equipment Utilized During Treatment: Oxygen;Gait belt Activity Tolerance: Patient limited by fatigue Patient left: in chair;with call bell/phone within reach;with chair alarm set;with family/visitor present     Time: 2010-0712 PT Time Calculation (min) (ACUTE ONLY): 44 min  Charges:  $Gait Training: 8-22 mins $Therapeutic Exercise: 8-22 mins $Therapeutic Activity: 8-22 mins                    G Codes:      Laterrica Libman Oct 01, 2015, 4:09 PM Pager [phone]

## 2015-09-26 NOTE — Clinical Social Work Note (Signed)
Patient transferred to 3E05 from 2H18, patient still plans to go to [facility], [place] provided handoff to unit social worker.  This CSW to sign off.  Jones Broom. [place], MSW, [place] 09/26/2015 5:53 PM

## 2015-09-26 NOTE — Consult Note (Signed)
WOC wound consult note Reason for Consult: Unna's boots Appears Unnas boots ordered per cardiology 09/17/15 and orders did not indicate frequency at which to change. I have remove the wraps today to evaluate the legs.  The skin is intact bilaterally.  I have washed and dried the patient's legs today and ordered Coban for use by the orthopedic techs.  I will let bedside RN know to page ortho techs for replacement of Unna's boots once they coban arrives to the unit.  Modified the nursing order to make sure staff are aware to have Unna's boots changed every Monday.   Discussed POC with patient and bedside nurse.  Re consult if needed, will not follow at this time. Thanks  Christen Wardrop [place], [place] [phone])

## 2015-09-26 NOTE — Progress Notes (Addendum)
[address]       [place] [address]             [phone]        CARDIOTHORACIC SURGERY PROGRESS NOTE   R5 Days Post-Op Procedure(s) (LRB): TRANSESOPHAGEAL ECHOCARDIOGRAM (TEE) (N/A)  Subjective:  Patient looks and feels considerably better. She reportedly had an excellent weekend. At present she denies dyspnea.  Previous problems with confusion and delirium have reportedly resolved.  Objective: Vital signs: BP Readings from Last 1 Encounters:  09/26/15 124/83   Pulse Readings from Last 1 Encounters:  09/26/15 94   Resp Readings from Last 1 Encounters:  09/26/15 20   Temp Readings from Last 1 Encounters:  09/26/15 98.6 F (37 C) Oral    Hemodynamics: CVP:  [2 mmHg-8 mmHg] 3 mmHg  Physical Exam:  Rhythm:   Sinus  Breath sounds: Diminished at bases but otherwise clear  Heart sounds:  Regular rate and rhythm with II/VI systolic murmur LLSB  Incisions:  n/a  Abdomen:  Soft  Extremities:  Warm and well-perfused, edema decreased   Intake/Output from previous day: 04/30 0701 - 05/01 0700 In: 960 [P.O.:960] Out: 2477 [Urine:2475; Stool:2] Intake/Output this shift: Total I/O In: 480 [P.O.:480] Out: 115 [Urine:115]  Lab Results:  CBC: Recent Labs  09/25/15 0357 09/26/15 0610  WBC 9.1 9.1  HGB 11.4* 11.9*  HCT 40.6 43.4  PLT 305 295    BMET:  Recent Labs  09/25/15 0357 09/26/15 0610  NA 134* 136  K 3.1* 3.2*  CL 80* 86*  CO2 41* 37*  GLUCOSE 123* 133*  BUN 62* 49*  CREATININE 1.29* 1.19*  CALCIUM 9.7 9.8     PT/INR:   Recent Labs  09/24/15 0400  LABPROT 15.7*  INR 1.23    CBG (last 3)   Recent Labs  09/25/15 1600 09/25/15 2137 09/26/15 0803  GLUCAP 241* 227* 168*    ABG    Component Value Date/Time   PHART 7.419 09/22/2015 0910   PCO2ART 93.0* 09/22/2015 0910   PO2ART 83.3 09/22/2015 0910   HCO3 59.3* 09/22/2015 0910   TCO2 62.2 09/22/2015 0910   O2SAT 74.3 09/25/2015 0400    CXR: CHEST 2  VIEW  COMPARISON: 09/21/2015  FINDINGS: Stable right PICC line. Cardiac enlargement stable. Hazy airspace disease right lower lobe again identified, improved when compared to prior study. Mild left lower lobe atelectasis.  Vascular pattern within normal limits. No evidence of pulmonary edema.  IMPRESSION: Persistent but improved right base airspace disease.   Electronically Signed  By: Skipper Cliche M.D.  On: 09/25/2015 15:50   Procedure: TEE  Indication: Aortic stenosis.   Sedation: Versed 1 mg IV x 1 with anesthesia available.   Findings: Please see echo section for full report. Normal LV size with D-shaped septum suggesting RV pressure/volume overload. LV EF 45-50%, diffuse hypokinesis. Severely dilated RV with mild to moderate systolic dysfunction. Mild left atrial enlargement, no LA appendage thrombus. Moderate to severe right atrial enlargement. There was moderate to severe tricuspid regurgitation. Mild to moderate mitral regurgitation. The aortic valve was trileaflet with severe calcification. There was moderate to severe aortic stenosis with mean gradient 37 mmHg and AVA 1.0 cm^2. Trivial aortic insufficiency. No ASD or PFO. Normal caliber aorta with grade III plaque in the descending thoracic aorta.   Aortic stenosis is truly borderline, moderate to severe.   Loralie Champagne 09/21/2015 2:41 PM          Assessment/Plan: S/P Procedure(s) (  LRB): TRANSESOPHAGEAL ECHOCARDIOGRAM (TEE) (N/A)  I have personally reviewed the patient's transesophageal echocardiogram performed last week. Findings are consistent with the presence of moderate aortic stenosis, perhaps bordering on severe.  However, the amount of leaflet restriction is not severe. The patient's murmur on physical exam is under-whelming. I feel that the patient's bigger problem is chronic right-sided congestive heart failure with underlying severe chronic lung disease, and she has  responded well to intensive medical therapy.  Given the fact that her left ventricular systolic function remains preserved I am not convinced that it makes sense to proceed with surgical management of aortic stenosis at this time.    I favor continued aggressive medical therapy with close follow-up for the time being. I would not consider this patient a candidate for conventional surgical aortic valve replacement and tricuspid valve repair with or without concomitant Maze procedure. Transcatheter aortic valve replacement could be considered if her aortic stenosis progresses and she otherwise remains reasonably stable on medical therapy. Moreover, the patient's left lung mass needs to be worked up to include MRI of the brain and PET/CT scan. If further imaging studies suggest the presence of early stage lung cancer then she would likely need needle biopsy performed to establish tissue diagnosis with hopes that she might be treated with SBRT.  I do not feel that this patient should be considered candidate for surgical treatment of her left lung mass under any circumstances due to prohibitive operative risks.  Please call if we can be of further assistance.  We would be happy to see this patient in follow up in the future as needed.  I spent in excess of 30 minutes during the conduct of this hospital encounter and >50% of this time involved direct face-to-face encounter with the patient for counseling and/or coordination of their care.    Rexene Alberts, MD 09/26/2015 10:54 AM

## 2015-09-26 NOTE — Progress Notes (Signed)
Inpatient Diabetes Program Recommendations  AACE/ADA: New Consensus Statement on Inpatient Glycemic Control (2015)  Target Ranges:  Prepandial:   less than 140 mg/dL      Peak postprandial:   less than 180 mg/dL (1-2 hours)      Critically ill patients:  140 - 180 mg/dL  Results for KENDLE, TURBIN (MRN [id number]) as of 09/26/2015 10:36  Ref. Range 09/25/2015 08:32 09/25/2015 12:55 09/25/2015 16:00 09/25/2015 21:37 09/26/2015 08:03  Glucose-Capillary Latest Ref Range: 65-99 mg/dL 159 (H) 226 (H) 241 (H) 227 (H) 168 (H)   Review of Glycemic Control  Current orders for Inpatient glycemic control: Lantus 10 units QHS, Novolog 0-15 units TID with meals, Novolog 0-5 units QHS  Inpatient Diabetes Program Recommendations: Insulin - Meal Coverage: Post prandial glucose is consistently elevated. Please consider ordering Novolog 4 units TID with meals for meal coverage.  Thanks, Barnie Alderman, RN, MSN, CDE Diabetes Coordinator Inpatient Diabetes Program [phone] (Team Pager from [place] to [place]) [phone] ([facility] office) [phone] [facility] office) [phone] [facility] office)

## 2015-09-26 NOTE — Progress Notes (Addendum)
Patient ID: Jillian Bright, female   DOB: [birth year], 70 y.o.   MRN: [id number]   ADVANCED HF ROUNDING NOTE    Subjective:   70 year old female history of coronary artery disease, congestive heart failure, diabetes mellitus, oxygen dependent COPD, obstructive sleep apnea-intolerant to CPAP, pulmonary hypertension, obesity, hyperlipidemia. She was last seen by Dr. Aundra Dubin in August 2013. She initially presented with peripheral edema in 2009 and had a normal stress test and preserved EF on echocardiogram. She had a right heart catheterization which showed mild/moderate pulmonary hypertension which responded well to oxygen with decreased PA pressure. She had 2-D echocardiogram on 09/08/2015 revealing normal ejection fraction of 60-65% and wall motion. Grade 1 diastolic dysfunction. Moderate to severe aortic stenosis where previously was moderate. Mild MR. Left atrium mildly dilated. Peak PA pressure 61 mmHg. Right atrium moderately to severely dilated. Moderate tricuspid valve regurgitation.  Thoracentesis 4/25 with 600 cc bloody fluid out, cytology negative for malignant cells.   TEE (09/21/15) with normal LV size with D-shaped septum suggesting RV pressure/volume overload. LV EF 45-50%, diffuse hypokinesis. Severely dilated RV with mild to moderate systolic dysfunction. Mild left atrial enlargement, no LA appendage thrombus. Moderate to severe right atrial enlargement. There was moderate to severe tricuspid regurgitation. Mild to moderate mitral regurgitation. The aortic valve was trileaflet with severe calcification. There was moderate to severe aortic stenosis with mean gradient 37 mmHg and AVA 1.0 cm^2.   09/13/15 CTA: negative for PE, enlarging lung nodule noted.  Overall diuresed 37 pounds. CVP 2-3. Not confused over night. Denies SOB. Now on 4L [place].    Objective:   Weight Range:  Vital Signs:   Temp:  [97.6 F (36.4 C)-98.9 F (37.2 C)] 98.3 F (36.8 C) (05/01 0400) Pulse Rate:   [87-97] 93 (05/01 0600) Resp:  [10-18] 11 (05/01 0600) BP: (82-143)/(57-85) 143/83 mmHg (05/01 0600) SpO2:  [92 %-100 %] 97 % (05/01 0600) Weight:  [169 lb 5 oz (76.8 kg)] 169 lb 5 oz (76.8 kg) (05/01 0400) Last BM Date: 09/24/15  Weight change: Filed Weights   09/24/15 0417 09/25/15 0418 09/26/15 0400  Weight: 173 lb 15.1 oz (78.9 kg) 176 lb 2.4 oz (79.9 kg) 169 lb 5 oz (76.8 kg)    Intake/Output:   Intake/Output Summary (Last 24 hours) at 09/26/15 0739 Last data filed at 09/26/15 0557  Gross per 24 hour  Intake    960 ml  Output   2477 ml  Net  -1517 ml     Physical Exam: CVP 2-3  General: NAD. No resp difficulty. Lying flat in  bed.  HEENT: normal Neck: supple. JVP flat Carotids 2+ bilat; no bruits. No lymphadenopathy or thryomegaly appreciated. Cor: PMI nondisplaced. Regular rate & rhythm. No rubs, gallops.  3/6 crescendo-decrescendo murmur RUSB with muffled S2. Lungs: RML RLL decreased. On 3 liters oxygen Abdomen: soft, nontender, nondistended. No hepatosplenomegaly. No bruits or masses. Good bowel sounds. Extremities: no cyanosis, clubbing, rash.  R and LLE with coban wraps. SCDs in place Neuro: confused , cranial nerves grossly intact. moves all 4 extremities w/o difficulty. Affect pleasant GU: Foley   Telemetry: SR 90  Labs: Basic Metabolic Panel:  Recent Labs Lab 09/22/15 0405 09/23/15 0432  09/23/15 2025 09/24/15 0400 09/24/15 1650 09/25/15 0357 09/26/15 0610  NA 134* 132*  < > 130* 132* 133* 134* 136  K 3.5 2.4*  < > 3.4* 2.8* 3.4* 3.1* 3.2*  CL 66* 67*  < > 69* 75* 76* 80* 86*  CO2 >50* >50*  < >  46* 45* 43* 41* 37*  GLUCOSE 226* 144*  < > 157* 127* 81 123* 133*  BUN 43* 53*  < > 64* 65* 62* 62* 49*  CREATININE 1.45* 1.42*  < > 1.62* 1.39* 1.41* 1.29* 1.19*  CALCIUM 9.8 9.9  < > 9.9 9.5 9.6 9.7 9.8  MG 2.1 1.9  --   --  1.7  --  1.8 1.6*  PHOS  --   --   --   --  3.9  --   --   --   < > = values in this interval not displayed.  Liver Function  Tests:  Recent Labs Lab 09/20/15 1657 09/21/15 0355 09/22/15 0405 09/24/15 0400 09/26/15 0610  AST  --  35 42*  --  23  ALT  --  21 29  --  17  ALKPHOS  --  53 68  --  63  BILITOT  --  1.3* 1.3*  --  0.9  PROT 6.5 7.1 8.0  --  7.6  ALBUMIN  --  3.4* 3.9 3.6 3.6   No results for input(s): LIPASE, AMYLASE in the last 168 hours. No results for input(s): AMMONIA in the last 168 hours.  CBC:  Recent Labs Lab 09/22/15 0405 09/23/15 0432 09/24/15 0400 09/25/15 0357 09/26/15 0610  WBC 7.9 8.3 8.1 9.1 9.1  NEUTROABS 7.1  --  5.5  --  5.9  HGB 9.9* 10.1* 10.2* 11.4* 11.9*  HCT 37.4 37.2 38.0 40.6 43.4  MCV 77.8* 77.5* 77.6* 78.2 77.8*  PLT 241 254 302 305 295    Cardiac Enzymes: No results for input(s): CKTOTAL, CKMB, CKMBINDEX, TROPONINI in the last 168 hours.  BNP: BNP (last 3 results)  Recent Labs  11/18/14 1015 09/07/15 2000  BNP 176.5* 848.6*    ProBNP (last 3 results) No results for input(s): PROBNP in the last 8760 hours.    Other results:  Imaging: Dg Chest 2 View  09/25/2015  CLINICAL DATA:  Cough; right pleural effusion; H/o DM, COPD, and MI; former smoker EXAM: CHEST  2 VIEW COMPARISON:  09/21/2015 FINDINGS: Stable right PICC line. Cardiac enlargement stable. Hazy airspace disease right lower lobe again identified, improved when compared to prior study. Mild left lower lobe atelectasis. Vascular pattern within normal limits. No evidence of pulmonary edema. IMPRESSION: Persistent but improved right base airspace disease. Electronically Signed   By: Skipper Cliche M.D.   On: 09/25/2015 15:50     Medications:     Scheduled Medications: . acetaZOLAMIDE  250 mg Oral BID  . antiseptic oral rinse  7 mL Mouth Rinse BID  . aspirin  81 mg Oral Daily  . atorvastatin  20 mg Oral Daily  . darifenacin  7.5 mg Oral Daily  . DULoxetine  60 mg Oral Daily  . ferrous sulfate  325 mg Oral BID WC  . gabapentin  100 mg Oral TID  . guaiFENesin  600 mg Oral BID  .  heparin subcutaneous  5,000 Units Subcutaneous Q8H  . insulin aspart  0-15 Units Subcutaneous TID WC  . insulin aspart  0-5 Units Subcutaneous QHS  . insulin glargine  10 Units Subcutaneous QHS  . lidocaine  1 patch Transdermal Q24H  . mometasone-formoterol  2 puff Inhalation BID  . pantoprazole  40 mg Oral Daily  . polyethylene glycol  17 g Oral Daily  . potassium chloride  20 mEq Oral BID  . senna-docusate  1 tablet Oral QHS  . sodium chloride flush  3 mL  Intravenous Q12H  . spironolactone  12.5 mg Oral Daily  . torsemide  40 mg Oral Daily  . vitamin B-12  1,000 mcg Oral Daily    Infusions:    PRN Medications: sodium chloride, ALPRAZolam, ipratropium-albuterol, ondansetron (ZOFRAN) IV, oxyCODONE-acetaminophen, sodium chloride flush, sodium chloride flush   Assessment/Plan/Discussion     1. 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 TEE 4/27 with EF 45-50%, D-shaped interventricular septum, severely dilated RV with mild to moderate systolic dysfunction, moderate to severe AS.  She was started on dobutamine for RV support.  Off dobutamine Friday 4/28. Volume status stable. CVP down to 2-3  - Hold torsemide today.   Overall diuresed 37 pounds. Replete KCL.  Check BMET at 1300  - Can stop acetazolamide.   - D/C foley.     2. Aortic stenosis: TEE done, AS is moderate to severe by numbers with mean gradient 37 and AVA 1.0 cm^2 => moderate to severe stenosis (not critical).  Dr. Aundra Dubin reviewed her TEE with Dr Burt Knack.  While we suspect that the valve is hemodynamically significant, her main problem here seems to be severe right heart failure.  She also has moderate to probably severe TR.  The right heart problems will likely not go away with TAVR, and we would be exposing her to a high risk procedure (may not be able to come off vent). For now, continue medical management of her RV failure. Now that she seems more compensated, will review TAVR again  with Dr Roxy Manns.  - If we decide to proceed with TAVR, will need RHC/LHC.    3. Pulmonary hypertension: She has a history of pulmonary hypertension thought to be due to OHS/OSA. Suspect her [place] currently is a combination of OHS/OSA and pulmonary venous hypertension from volume overload but now euvolemic.    4. OHS/OSA: She is on high dose home O2 chronically. Had not been able to tolerate CPAP in the past but doing ok with it here.  Will have her wear CPAP at night.   5. COPD: On home O2, no longer smokes. She is now back on her chronic home oxygen (4-5 L by [place]).   6. CAD: h/o RCA PCI. Stable.  Continue ASA 81 and statin.   7. Tricuspid regurgitation: Moderate to probably severe in setting of severe RV dilation.   8. Pulmonary nodule: Enlarging pulmonary nodule concerning for bronchogenic carcinoma, especially with smoking history. This will have to be addressed after her heart issues are stabilized. Will need PET scan after cardiac issues stabilized (must be done as outpatient).  - Thoracentesis on 4/25, bloody fluid sent for cytology, fluid negative for malignant cells.  9. AKI on CKD: Creatinine improved at 1.19 today.  Follow closely.   10. Alkalosis: Primarily due to chronic CO2 retention +/- diuresis. Stop acetazolamide today.      11. Hypokalemia: Continue to supp. Increase spiro 25 mg daily. BMET at 1300   12. Delirium: Confusion at times, worse at night. This seems improved now.   13. Hypomagnesium: Mag 1.6 Give 4 grams Mag now.   13. Disposition: Will need SNF. She is DNR/DNI.  If no further procedures, probably could go to SNF perhaps Wednesday. Needs PT.   Amy Clegg NP-C  09/26/2015 7:39 AM   Patient seen with NP, agree with the above note.  She looks considerably better.  Weight is down a lot and CVP now 3. She is back on oxygen at 4-5 L by  [place], which is her home regimen.  Alert/oriented this morning.  Creatinine improved.  May hold torsemide today with low CVP, probably  restart tomorrow.   Will discuss with Dr Roxy Manns today regarding TAVR.  Her clinical status is about as good as it is going to get at this point.  However, she still has considerable comorbidities.  If we proceed with TAVR, will need RHC/LHC.  If not, will be ready for discharge to SNF on Tuesday or Wednesday.    She will need outpatient workup of concerning lung mass => PET/CT and pulmonary followup.   Loralie Champagne 09/26/2015 9:13 AM  Dr Roxy Manns and I discussed Mrs Rhude.  Her aortic valve shows borderline severe stenosis, not critical.  Her main problem is RV failure that will not be significantly improved by TAVR, and TAVR would expose her to significant risks (general anesthesia).  Therefore, plan to continue medical treatment for now.  No cath.   Loralie Champagne 09/26/2015 11:00 AM

## 2015-09-26 NOTE — Progress Notes (Signed)
Name: Jillian Bright MRN: [id number] DOB: [birth year]    ADMISSION DATE:  09/07/2015 CONSULTATION DATE:  4/19  REFERRING MD :  [facility]   CHIEF COMPLAINT:  Lung nodule   BRIEF PATIENT DESCRIPTION: 70yo morbidly obese female former smoker (quit 2009) with multiple medical problems including COPD, OSA (intolerant to CPAP), Pulmonary HTN (PA peak pressure 61 mmHg), dCHF, Severe AS, CAD, chronic respiratory failure on home O2 initially presented 4/12 with 3 week hx BLE edema, SOB, orthopnea.  She was admitted, diuresed and seen in consultation by CVTS for possible aortic valve repair/replacement.  CTA chest to r/o PE revealed enlarging LUL pulmonary nodule and PCCM consulted.   SIGNIFICANT EVENTS  4/12 admitted for SOB 4/19 PCCM consulted for enlarging LUL nodule.  4/24 could not lay flat cath, dobutamine, lasix drip 4/25- thor 600 bloody tap 4/26- neg 6.8 liters!!, improved resp status  STUDIES:  CTA chest 4/19>>>Negative for pulmonary embolus.  Left upper lobe pulmonary nodule has enlarged since the prior CT scan and could be a bronchogenic carcinoma. PET CT scan is recommended for further evaluation.  Moderate right pleural effusion with associated compressive atelectasis. 2D echo 4/13>>> 73-71%, grade 1 diastolic dysfunction, severe AS, mild MR, severely dilated RA, PA peak pressure 39mHg TEE 4/27>>>D-shaped septum suggesting RV pressure/volume overload. LV EF 45-50%, diffuse hypokinesis. Severely dilated RV with mild to moderate systolic dysfunction. Mild left atrial enlargement, no LA appendage thrombus. Moderate to severe right atrial enlargement. There was moderate to severe tricuspid regurgitation. Mild to moderate mitral regurgitation. The aortic valve was trileaflet with severe calcification. There was moderate to severe aortic stenosis with mean gradient 37 mmHg and AVA 1.0 cm^2.  CT chest after thora rt 2/27>>>small left over effusion, ATX rt base and cnnot exlude  infiltrate  HISTORY OF PRESENT ILLNESS:  69yo morbidly obese female former smoker (quit 2009) with multiple medical problems including COPD, OSA (intolerant to CPAP), Pulmonary HTN (PA peak pressure 61 mmHg), dCHF, Severe AS, CAD, chronic respiratory failure on home O2 initially presented 4/12 with 3 week hx BLE edema, SOB, orthopnea.  She was admitted, diuresed and seen in consultation by CVTS for possible aortic valve repair/replacement.  CTA chest to r/o PE revealed enlarging LUL pulmonary nodule and PCCM consulted.   SUBJECTIVE:  No events overnight.  VITAL SIGNS: Temp:  [97.6 F (36.4 C)-98.9 F (37.2 C)] 97.7 F (36.5 C) (05/01 1121) Pulse Rate:  [87-98] 98 (05/01 1121) Resp:  [11-20] 12 (05/01 1121) BP: (105-143)/(71-85) 113/76 mmHg (05/01 1121) SpO2:  [94 %-99 %] 98 % (05/01 1121) Weight:  [76.8 kg (169 lb 5 oz)] 76.8 kg (169 lb 5 oz) (05/01 0400)  PHYSICAL EXAMINATION: General: NAD , in bed, no distress Neuro:  Awake, alert, appropriate, MAE  HEENT:  Mm moist, no JVD  Cardiovascular:  s1s2 rr murm Lungs: Improved aeration rt base Abdomen:  Round, soft, +bs  Musculoskeletal:  Warm and dry, Gr 2 BLE edema reduced, erythema   Recent Labs Lab 09/24/15 1650 09/25/15 0357 09/26/15 0610  NA 133* 134* 136  K 3.4* 3.1* 3.2*  CL 76* 80* 86*  CO2 43* 41* 37*  BUN 62* 62* 49*  CREATININE 1.41* 1.29* 1.19*  GLUCOSE 81 123* 133*   Recent Labs Lab 09/24/15 0400 09/25/15 0357 09/26/15 0610  HGB 10.2* 11.4* 11.9*  HCT 38.0 40.6 43.4  WBC 8.1 9.1 9.1  PLT 302 305 295   Dg Chest 2 View  09/25/2015  CLINICAL DATA:  Cough; right pleural effusion;  H/o DM, COPD, and MI; former smoker EXAM: CHEST  2 VIEW COMPARISON:  09/21/2015 FINDINGS: Stable right PICC line. Cardiac enlargement stable. Hazy airspace disease right lower lobe again identified, improved when compared to prior study. Mild left lower lobe atelectasis. Vascular pattern within normal limits. No evidence of pulmonary  edema. IMPRESSION: Persistent but improved right base airspace disease. Electronically Signed   By: Skipper Cliche M.D.   On: 09/25/2015 15:50   I reviewed chest CT personally, left sided nodule noted.  ASSESSMENT / PLAN:  LUL pulmonary nodule (11.26m x 8.769m - slightly increased from previous CT scan in 2015 COPD. Severe with an FEV1 of 1.12 or 46% OSA - intolerant to CPAP -  [facility] Secondary PAH R effusion with associated RLL collapse - hemorrhagic, unclear etiology, r.o malignant Pt with mod-severe AS, RV failure, acute on chronic diastolic HF exacerbation, CAD Acute on Chronic Hypoxemic Hypercapneic Resp Fx 2/2 above  REC -  Neg balance goals remain. Supplement electrolytes as indicated. Path negative, does not exclude cancer, likely heart failure related. Keep sat 94% and avoid desaturation with PA HTN, reviewed findings on TEE DNR status noted. Will need a PET scan as the patient is a very high risk for biopsy, can be done as outpatient. No role NIMV, at baseline hypercarbia.   Discussed with bedside RN.  Please arrange outpatient f/u post PET scan with PCCM.  PCCM will sign off, please call back if needed.  WeRush FarmerM.D. [facility]. Pager: [phone] hours pager: [phone]

## 2015-09-27 ENCOUNTER — Encounter (HOSPITAL_COMMUNITY)
Admission: EM | Disposition: A | Discharge status: Skilled Nursing Facility | Payer: Self-pay | Source: Home / Self Care | Attending: Internal Medicine

## 2015-09-27 LAB — BASIC METABOLIC PANEL
Anion gap: 12 (ref 5–15)
BUN: 39 mg/dL — AB (ref 6–20)
CALCIUM: 9.4 mg/dL (ref 8.9–10.3)
CHLORIDE: 89 mmol/L — AB (ref 101–111)
CO2: 30 mmol/L (ref 22–32)
Creatinine, Ser: 1.09 mg/dL — ABNORMAL HIGH (ref 0.44–1.00)
GFR calc Af Amer: 58 mL/min — ABNORMAL LOW (ref >60)
GFR calc non Af Amer: 50 mL/min — ABNORMAL LOW (ref >60)
Glucose, Bld: 121 mg/dL — ABNORMAL HIGH (ref 65–99)
POTASSIUM: 3.5 mmol/L (ref 3.5–5.1)
SODIUM: 131 mmol/L — AB (ref 135–145)

## 2015-09-27 LAB — CBC
HCT: 40.9 % (ref 36.0–46.0)
Hemoglobin: 11.5 g/dL — ABNORMAL LOW (ref 12.0–15.0)
MCH: 21.4 pg — AB (ref 26.0–34.0)
MCHC: 28.1 g/dL — AB (ref 30.0–36.0)
MCV: 76 fL — AB (ref 78.0–100.0)
PLATELETS: 313 10*3/uL (ref 150–400)
RBC: 5.38 MIL/uL — ABNORMAL HIGH (ref 3.87–5.11)
RDW: 20.2 % — ABNORMAL HIGH (ref 11.5–15.5)
WBC: 8.8 10*3/uL (ref 4.0–10.5)

## 2015-09-27 LAB — GLUCOSE, CAPILLARY
GLUCOSE-CAPILLARY: 272 mg/dL — AB (ref 65–99)
GLUCOSE-CAPILLARY: 159 mg/dL — AB (ref 65–99)
GLUCOSE-CAPILLARY: 183 mg/dL — AB (ref 65–99)
Glucose-Capillary: 143 mg/dL — ABNORMAL HIGH (ref 65–99)
Glucose-Capillary: 199 mg/dL — ABNORMAL HIGH (ref 65–99)

## 2015-09-27 SURGERY — RIGHT/LEFT HEART CATH AND CORONARY ANGIOGRAPHY

## 2015-09-27 MED ORDER — TORSEMIDE 20 MG PO TABS
40.0000 mg | ORAL_TABLET | Freq: Every day | ORAL | Status: DC
  Administered 2015-09-27 – 2015-09-28 (×2): 40 mg via ORAL
  Filled 2015-09-27 (×2): qty 2

## 2015-09-27 MED ORDER — TORSEMIDE 20 MG PO TABS: 20.0000 mg | ORAL_TABLET | Freq: Every day | ORAL | Status: DC

## 2015-09-27 MED ORDER — INSULIN ASPART 100 UNIT/ML ~~LOC~~ SOLN
3.0000 [IU] | Freq: Three times a day (TID) | SUBCUTANEOUS | Status: DC
  Administered 2015-09-28: 3 [IU] via SUBCUTANEOUS

## 2015-09-27 NOTE — Progress Notes (Signed)
Patient refused CPAP at this time. Patient encouraged to call for Respiratory if CPAP desired during hospital stay.

## 2015-09-27 NOTE — Progress Notes (Signed)
[facility] Progress Note  Patient: Jillian Bright FYB:[id number]   PCP: [name], MD DOB: [birth year]   DOA: 09/07/2015   DOS: 09/27/2015   Date of Service: the patient was seen and examined on 09/27/2015 Outpatient Specialists: Vineet sood pulmonary Loralie Champagne cardiology  Subjective: No acute complaint of nausea and vomiting breathing is better. No confusion. Nutrition: Tolerating oral diet  Brief hospital course: Patient was admitted on 09/07/2015, with complaint of worsening shortness of breath and orthopnea, was found to have acute on chronic diastolic CHF with significant volume overload. Patient was started on IV Lasix and was diuresed. On 09/12/2015 due to persistent CHF exacerbation and hypotension, Cardiology was consulted and the patient was started on dopamine after a PICC line insertion.  Echocardiogram showed moderate to severe aortic stenosis and cardiac thoracic surgery was consulted. Dopamine was started and the patient was given intermittent Lasix with Zaroxolyn. On April 24 patient had significant increase in oxygen requirement with hypotension and therefore was placed on dobutamine with Lasix infusion. On 09/14/2015 on pulmonary was consulted as well for suspected pulmonary nodule as well as pleural effusion. The patient underwent right thoracentesis with bloody pleural fluid 600 mL drained. Pleural fluid cytology is negative for malignancy, culture is also negative. 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 Patient will need outpatient PET scan. On 09/21/2015 TEE showed moderate aortic stenosis.  Dobutamine has been stopped on 09/23/2015. Torsemide is started now. After stopping the fentanyl patch the patient had significant improvement in mental status  Assessment and Plan: 1. Acute on chronic diastolic (congestive) heart failure (HCC) Probably worsening due to noncompliance as an  outpatient Cardiology as well as pulmonary has been consulted on the patient. Patient status post right pleural effusion thoracentesis. Initially was on dopamine drip, later on dobutamine drip as well as Lasix infusion, now discontinued since 09/23/2015 Ins and outs not inadequate due to lack of Foley catheter. Weight continues to go down. From 113 KG>> 79.9 KG. Renal function stable. Torsemide resumed at 40 mg Monitor daily weight and ins and outs. Appreciate cardiology input. Patient is also started on Aldactone.  2. COPD with chronic respiratory failure. Acute on chronic hypoxic respiratory failure, home oxygen 3 L  Right-sided pleural effusion. Exudative Left upper pulmonary nodule. Patient underwent thoracentesis of the right pleural effusion.  Negative culture as well as cytology of the pleural fluid. Repeat CT scan shows evidence of atelectasis versus possible infiltrate although the patient is not exhibiting any signs of infection. ABG shows mixed respiratory acidosis and metabolic alkalosis, BiPAP when necessary ordered, but never needed. Completed prednisone for 3 days. Repeat chest x-ray on 09/25/2015 does not show any reaccumulation of the fluid Currently requiring 4 L of nasal cannula to maintain saturation more than 94%. Continue incentive spirometry.Flutter device for pulmonary toilet Pulmonary has also signed off.Appreciate input. Will need outpatient follow-up as well as outpatient PET scan.  3. Aortic stenosis. Moderate to severe based on TEE. Cardiology feels that the patient would not be an ideal candidate for TAVR, since she has significant right-sided heart failure with severe TR and would not add any benefit from the surgery symptomatically. Appreciate input from cardiac thoracic surgery who would like to medically manage the patient. Also patient's left pulmonary nodule requiring further workup and possibility of bronchogenic carcinoma would also limit feasibility  of any surgical or interventional procedure for aortic stenosis. Patient will need outpatient follow-up with  cardiac thoracic surgery after PET scan.  4. Acute on chronic kidney injury. Chronic kidney disease stage II. Renal function mildly worsened during the hospitalization but improved after initiation of the dobutamine drip. Likely cardiorenal hemodynamics. Renal function continues to improve despite stopping dobutamine drip.  Continue to monitor. Patient will need outpatient BMP  4. Hypokalemia. Replacing. Scheduled potassium 20 mg twice a day Hypomagnesemia. Resolved  5. Right-sided chest pain as well as right upper quadrant pain. Resolved LFTs are normal. Likely related to the pleural effusion and atelectasis. CT scan is not showing any acute abnormality on the upper abdomen.  6. Microcytic anemia. Likely associated with iron deficiency. We will initiate iron supplementation. Further GI evaluation as an outpatient. Status post PRBC on April 16.  7. Type 2 diabetes mellitus. Hemoglobin A1c 7.3 on 09/08/2015. Holding home metformin in the hospital. Also glipizide has been on hold. We will increase the sliding scale to moderate. With fasting blood sugar 168 increase Lantus to 15 units Add 3 units of pre-meal coverage  8. Pain management. Acute encephalopathy Likely from fentanyl patch gradually improving. Also gradually tapering off the gabapentin from 300 mg 3 times a day to 100 mg 3 times a day.  9. Metabolic alkalosis Likely from aggressive diuresis. Almost resolved Also has chronic respiratory compensatory alkalosis at her baseline due to her COPD. Diamox was initially used but since now metabolic alkalosis has resolved it is discontinued.  10 vitamin B 12 deficiency. B-12 414,  Low normal side. Patient would benefit from supplementation.  Activity: physical therapy recommends SNF. Social worker on the case. Bowel regimen: last BM 09/24/2015. Bowel regimen  initiated. Diet: Cardiac diet carb modified, heart healthy DVT Prophylaxis: subcutaneous Heparin  Advance goals of care discussion: DNR/DNI  Family Communication: no family was present at bedside, at the time of interview.   Disposition:  Expected discharge date: 09/28/2015 Barriers to safe discharge: Stabilization on oral therapy.  Consultants: Pulmonary, cardiothoracic surgery, cardiology Procedures: Right thoracentesis 09/20/2015 TEE 09/21/2015, echocardiogram April 13,   Antibiotics: Anti-infectives    None        Intake/Output Summary (Last 24 hours) at 09/27/15 1847 Last data filed at 09/27/15 1700  Gross per 24 hour  Intake   1060 ml  Output    206 ml  Net    854 ml   Filed Weights   09/25/15 0418 09/26/15 0400 09/27/15 0642  Weight: 79.9 kg (176 lb 2.4 oz) 76.8 kg (169 lb 5 oz) 74.934 kg (165 lb 3.2 oz)    Objective: Physical Exam: Filed Vitals:   09/27/15 0143 09/27/15 0642 09/27/15 0847 09/27/15 1211  BP:  110/60 90/61 118/72  Pulse:  84 98 95  Temp:  97.9 F (36.6 C) 97.8 F (36.6 C) 97.2 F (36.2 C)  TempSrc:  Oral Oral Oral  Resp:  '18 18 18  '$ Height:      Weight:  74.934 kg (165 lb 3.2 oz)    SpO2: 94% 97% 98% 100%   General: Appear in Mild distress, no Rash; Oral Mucosa moist. Cardiovascular: S1 and S2 Present, aortic systolic Murmur,no JVD Respiratory: Bilateral Air entry present and Resolving Crackles, no wheezes Abdomen: Bowel Sound present, Soft and no tenderness Extremities: Trace bilateral  Pedal edema, no calf tenderness Neurology: No focal deficit,  Data Reviewed: CBC:  Recent Labs Lab 09/22/15 0405 09/23/15 0432 09/24/15 0400 09/25/15 0357 09/26/15 0610 09/27/15 0216  WBC 7.9 8.3 8.1 9.1 9.1 8.8  NEUTROABS 7.1  --  5.5  --  5.9  --   HGB 9.9* 10.1* 10.2* 11.4* 11.9* 11.5*  HCT 37.4 37.2 38.0 40.6 43.4 40.9  MCV 77.8* 77.5* 77.6* 78.2 77.8* 76.0*  PLT 241 254 302 305 295 784   Basic Metabolic Panel:  Recent Labs Lab  09/23/15 0432  09/24/15 0400 09/24/15 1650 09/25/15 0357 09/26/15 0610 09/26/15 1657 09/27/15 0216  NA 132*  < > 132* 133* 134* 136 130* 131*  K 2.4*  < > 2.8* 3.4* 3.1* 3.2* 3.7 3.5  CL 67*  < > 75* 76* 80* 86* 87* 89*  CO2 >50*  < > 45* 43* 41* 37* 32 30  GLUCOSE 144*  < > 127* 81 123* 133* 163* 121*  BUN 53*  < > 65* 62* 62* 49* 44* 39*  CREATININE 1.42*  < > 1.39* 1.41* 1.29* 1.19* 1.41* 1.09*  CALCIUM 9.9  < > 9.5 9.6 9.7 9.8 9.4 9.4  MG 1.9  --  1.7  --  1.8 1.6* 3.1*  --   PHOS  --   --  3.9  --   --   --   --   --   < > = values in this interval not displayed. GFR: Estimated Creatinine Clearance: 48.9 mL/min (by C-G formula based on Cr of 1.09). Liver Function Tests:  Recent Labs Lab 09/21/15 0355 09/22/15 0405 09/24/15 0400 09/26/15 0610  AST 35 42*  --  23  ALT 21 29  --  17  ALKPHOS 53 68  --  63  BILITOT 1.3* 1.3*  --  0.9  PROT 7.1 8.0  --  7.6  ALBUMIN 3.4* 3.9 3.6 3.6   No results for input(s): LIPASE, AMYLASE in the last 168 hours. No results for input(s): AMMONIA in the last 168 hours. Coagulation Profile:  Recent Labs Lab 09/24/15 0400  INR 1.23   Cardiac Enzymes: No results for input(s): CKTOTAL, CKMB, CKMBINDEX, TROPONINI in the last 168 hours. BNP (last 3 results) No results for input(s): PROBNP in the last 8760 hours.  CBG:  Recent Labs Lab 09/26/15 1722 09/26/15 2133 09/27/15 0633 09/27/15 1116 09/27/15 1620  GLUCAP 175* 272* 143* 199* 159*   Lipid Profile: No results for input(s): CHOL, HDL, LDLCALC, TRIG, CHOLHDL, LDLDIRECT in the last 72 hours. Urine analysis:    Component Value Date/Time   COLORURINE YELLOW 09/13/2015 2230   APPEARANCEUR CLEAR 09/13/2015 2230   LABSPEC 1.031* 09/13/2015 2230   PHURINE 6.0 09/13/2015 2230   GLUCOSEU NEGATIVE 09/13/2015 2230   HGBUR NEGATIVE 09/13/2015 2230   HGBUR negative 08/04/2008 1526   BILIRUBINUR NEGATIVE 09/13/2015 2230   BILIRUBINUR n 11/24/2012 1335   KETONESUR NEGATIVE  09/13/2015 2230   PROTEINUR NEGATIVE 09/13/2015 2230   PROTEINUR trace 11/24/2012 1335   UROBILINOGEN 0.2 11/18/2014 0750   UROBILINOGEN 0.2 11/24/2012 1335   NITRITE NEGATIVE 09/13/2015 2230   NITRITE n 11/24/2012 1335   LEUKOCYTESUR NEGATIVE 09/13/2015 2230    Recent Results (from the past 240 hour(s))  Body fluid culture     Status: None   Collection Time: 09/20/15  3:42 PM  Result Value Ref Range Status   Specimen Description PLEURAL RIGHT FLUID  Final   Special Requests Normal  Final   Gram Stain   Final    MODERATE WBC PRESENT,BOTH PMN AND MONONUCLEAR NO ORGANISMS SEEN    Culture NO GROWTH 3 DAYS  Final   Report Status 09/24/2015 FINAL  Final   Studies: No results found.   Scheduled Meds: . antiseptic oral  rinse  7 mL Mouth Rinse BID  . aspirin  81 mg Oral Daily  . atorvastatin  20 mg Oral Daily  . darifenacin  7.5 mg Oral Daily  . DULoxetine  60 mg Oral Daily  . ferrous sulfate  325 mg Oral BID WC  . gabapentin  100 mg Oral TID  . guaiFENesin  600 mg Oral BID  . heparin subcutaneous  5,000 Units Subcutaneous Q8H  . insulin aspart  0-15 Units Subcutaneous TID WC  . insulin aspart  0-5 Units Subcutaneous QHS  . insulin glargine  15 Units Subcutaneous QHS  . mometasone-formoterol  2 puff Inhalation BID  . pantoprazole  40 mg Oral Daily  . polyethylene glycol  17 g Oral Daily  . potassium chloride  20 mEq Oral BID  . senna-docusate  1 tablet Oral QHS  . sodium chloride flush  3 mL Intravenous Q12H  . spironolactone  25 mg Oral Daily  . torsemide  40 mg Oral Daily  . vitamin B-12  1,000 mcg Oral Daily   Continuous Infusions:   PRN Meds: sodium chloride, acetaminophen, ALPRAZolam, ipratropium-albuterol, ondansetron (ZOFRAN) IV, sodium chloride flush, sodium chloride flush, traMADol  Time spent: 30 minutes  Author: Berle Mull, MD [facility] Pager: [phone] 09/27/2015 6:47 PM  If 7PM-7AM, please contact night-coverage at [email or web address], password  [facility]

## 2015-09-27 NOTE — Progress Notes (Signed)
Physical Therapy Treatment Patient Details Name: Jillian Bright MRN: [id number] DOB: Nov 18, 1945 Today's Date: 09/27/2015    History of Present Illness Jillian Bright is a 70 y.o. female has a past medical history of Diabetes mellitus; COPD (chronic obstructive pulmonary disease); Sleep apnea, obstructive; Pulmonary HTN ; Obesity; Hyperlipidemia; Myocardial infarction; Coronary artery disease; Anxiety; Shortness of breath; GERD (gastroesophageal reflux disease); Arthritis; CHF (congestive heart failure); Pneumonia; Bronchitis; and History of hiatal hernia. Admitted for Acute on chronic diastolic CHF. CT scan negative for PE, + Left upper lobe pulmonary nodule; decr respiratory status with move to stepdown unit 4/19. RLL effusion with thoracentesis 4/25; encephalopathy    PT Comments    Patient much better, however remains somewhat confused (not recalling events of yesterday, not judging her deficits well). Mobility progressing well.  Follow Up Recommendations  SNF;Supervision/Assistance - 24 hour     Equipment Recommendations  None recommended by PT    Recommendations for Other Services OT consult     Precautions / Restrictions Precautions Precautions: Fall Precaution Comments: monitor O2 and BP Restrictions Weight Bearing Restrictions: No    Mobility  Bed Mobility               General bed mobility comments: up in recliner   Transfers Overall transfer level: Needs assistance Equipment used: Rolling walker (2 wheeled) Transfers: Sit to/from Stand Sit to Stand: Min guard         General transfer comment: Min guard to steady, cues for hand placement and overall safety  Ambulation/Gait Ambulation/Gait assistance: Min guard Ambulation Distance (Feet): 100 Feet (seated rest; 100) Assistive device: Rolling walker (2 wheeled) Gait Pattern/deviations: Step-through pattern;Decreased stride length;Trunk flexed Gait velocity: decreased   General Gait Details: Steadiness  improved.    Stairs            Wheelchair Mobility    Modified Rankin (Stroke Patients Only)       Balance Overall balance assessment: Needs assistance Sitting-balance support: No upper extremity supported;Feet supported Sitting balance-Leahy Scale: Good     Standing balance support: During functional activity;No upper extremity supported Standing balance-Leahy Scale: Poor Standing balance comment: Pt stood for 1 set of BUE flexion exercises                    Cognition Arousal/Alertness: Awake/alert Behavior During Therapy: WFL for tasks assessed/performed Overall Cognitive Status: Impaired/Different from baseline Area of Impairment: Memory Orientation Level:  (fully oriented!) Current Attention Level: Selective (with many distractions in hall) Memory: Decreased short-term memory Following Commands: Follows one step commands consistently;Follows multi-step commands with increased time Safety/Judgement: Decreased awareness of deficits;Decreased awareness of safety Awareness: Intellectual Problem Solving: Slow processing;Requires verbal cues;Requires tactile cues General Comments: did not recall walking with PT 5/1; repeats information with no recollection that she has already stated    Exercises General Exercises - Upper Extremity Shoulder Flexion: AROM;Both;10 reps;Seated;Standing Shoulder Extension: AROM;Both;10 reps;Seated;Standing Shoulder ABduction: AROM;Both;10 reps;Seated Shoulder ADduction: AROM;Both;10 reps;Seated Elbow Flexion: AROM;Both;10 reps;Seated Elbow Extension: AROM;Both;10 reps;Seated    General Comments General comments (skin integrity, edema, etc.): Not wearing [place] O2 on arrival; [id number]%      Pertinent Vitals/Pain Pain Assessment: No/denies pain    Home Living                      Prior Function            PT Goals (current goals can now be found in the care plan section) Acute Rehab PT Goals  Patient Stated Goal:  go to rehab soon Time For Goal Achievement: 10/06/15 Progress towards PT goals: Progressing toward goals    Frequency  Min 3X/week (much less confused; from home alone)    PT Plan Frequency needs to be updated    Co-evaluation             End of Session Equipment Utilized During Treatment: Oxygen;Gait belt Activity Tolerance: Patient limited by fatigue Patient left: in chair;with call bell/phone within reach;with family/visitor present     Time: 1407-1430 PT Time Calculation (min) (ACUTE ONLY): 23 min  Charges:  $Gait Training: 23-37 mins                    G Codes:      Alayah Knouff 09-28-15, 4:00 PM Pager [phone]

## 2015-09-27 NOTE — Consult Note (Signed)
   [facility] CM Inpatient Consult   09/27/2015  Jillian Bright Oct 08, 1945 [id number] Patient screened for potential [place] [facility] services. Patient is eligible for [place] through her Medicare insurance plan.  Admitted with HF exacerbation, COPD.  Chart review reveals patient's discharge plan is to discharge to a skilled nursing facility for rehab.  Currently, there were no identifiable [facility] care management needs at this time for community follow up.  If patient's post hospital needs change please place a [facility] Care Management consult. For questions please contact:   Natividad Brood, RN BSN [facility] Liaison  [phone] business mobile phone Toll free office [phone]

## 2015-09-27 NOTE — Progress Notes (Signed)
Occupational Therapy Treatment Patient Details Name: Jillian Bright MRN: [id number] DOB: 02/09/1946 Today's Date: 09/27/2015    History of present illness Jillian Bright is a 70 y.o. female has a past medical history of Diabetes mellitus; COPD (chronic obstructive pulmonary disease); Sleep apnea, obstructive; Pulmonary HTN ; Obesity; Hyperlipidemia; Myocardial infarction; Coronary artery disease; Anxiety; Shortness of breath; GERD (gastroesophageal reflux disease); Arthritis; CHF (congestive heart failure); Pneumonia; Bronchitis; and History of hiatal hernia. Admitted for Acute on chronic diastolic CHF. CT scan negative for PE, + Left upper lobe pulmonary nodule; decr respiratory status with move to stepdown unit 4/19. RLL effusion with thoracentesis 4/25; encephalopathy   OT comments  Patient making good progress towards OT goals, continue plan of care for now. Pt continues to demonstrate some confusion and decreased STM, but overall this has improved since initial evaluation.    Follow Up Recommendations  SNF;Supervision/Assistance - 24 hour    Equipment Recommendations  Other (comment) (TBD next venue of care)    Recommendations for Other Services  None at this time   Precautions / Restrictions Precautions Precautions: Fall Precaution Comments: monitor O2 and BP Restrictions Weight Bearing Restrictions: No     Mobility Bed Mobility General bed mobility comments: up in recliner   Transfers Overall transfer level: Needs assistance Equipment used: Rolling walker (2 wheeled) Transfers: Sit to/from Stand Sit to Stand: Min guard General transfer comment: Min guard to steady, cues for hand placement and overall safety    Balance Overall balance assessment: Needs assistance Sitting-balance support: No upper extremity supported;Feet supported Sitting balance-Leahy Scale: Good     Standing balance support: During functional activity;No upper extremity supported Standing  balance-Leahy Scale: Poor Standing balance comment: Pt stood for 1 set of BUE flexion exercises   ADL Overall ADL's : Needs assistance/impaired General ADL Comments: Pt found seated in recliner with brother present in room. Pt alert and oriented X4. Pt willing to work with therapist and didn't mind the gait belt today. Pt engaged in functional mobility throuhout room. Pt stood for 1 set of exercises, then sat for rest. Told patient 3 words "sock, blue, red" pt able to immediately recall words, then ~2 minutes later blurrted words out again. ~5 minutes later, therapist asked pt to state words again. Pt required mod verbal cues to remember words again.      Cognition   Behavior During Therapy: WFL for tasks assessed/performed Overall Cognitive Status: Impaired/Different from baseline Area of Impairment: Memory     Memory: Decreased short-term memory      Exercises General Exercises - Upper Extremity Shoulder Flexion: AROM;Both;10 reps;Seated;Standing Shoulder Extension: AROM;Both;10 reps;Seated;Standing Shoulder ABduction: AROM;Both;10 reps;Seated Shoulder ADduction: AROM;Both;10 reps;Seated Elbow Flexion: AROM;Both;10 reps;Seated Elbow Extension: AROM;Both;10 reps;Seated           Pertinent Vitals/ Pain       Pain Assessment: No/denies pain   Frequency Min 2X/week     Progress Toward Goals  OT Goals(current goals can now befound in the care plan section)  Progress towards OT goals: Progressing toward goals  Acute Rehab OT Goals Patient Stated Goal: go to rehab soon OT Goal Formulation: With patient Time For Goal Achievement: 10/04/15 (increased goal date, goals still remain appropriate) Potential to Achieve Goals: Good  Plan Discharge plan remains appropriate    End of Session Equipment Utilized During Treatment: Oxygen (4L/min)   Activity Tolerance Patient tolerated treatment well   Patient Left in chair;with call bell/phone within reach;with family/visitor present     Time: 3532-9924 OT  Time Calculation (min): 21 min  Charges: OT General Charges $OT Visit: 1 Procedure OT Treatments $Therapeutic Exercise: 8-22 mins  Chrys Racer , MS, OTR/L, [place] Pager: [phone]  09/27/2015, 3:57 PM

## 2015-09-27 NOTE — Progress Notes (Addendum)
Patient ID: Jillian Bright, female   DOB: 1946-04-22, 70 y.o.   MRN: [id number]   ADVANCED HF ROUNDING NOTE    Subjective:   70 year old female history of coronary artery disease, congestive heart failure, diabetes mellitus, oxygen dependent COPD, obstructive sleep apnea-intolerant to CPAP, pulmonary hypertension, obesity, hyperlipidemia. She was last seen by Dr. Aundra Dubin in August 2013. She initially presented with peripheral edema in 2009 and had a normal stress test and preserved EF on echocardiogram. She had a right heart catheterization which showed mild/moderate pulmonary hypertension which responded well to oxygen with decreased PA pressure. She had 2-D echocardiogram on 09/08/2015 revealing normal ejection fraction of 60-65% and wall motion. Grade 1 diastolic dysfunction. Moderate to severe aortic stenosis where previously was moderate. Mild MR. Left atrium mildly dilated. Peak PA pressure 61 mmHg. Right atrium moderately to severely dilated. Moderate tricuspid valve regurgitation.  Thoracentesis 4/25 with 600 cc bloody fluid out, cytology negative for malignant cells.   TEE (09/21/15) with normal LV size with D-shaped septum suggesting RV pressure/volume overload. LV EF 45-50%, diffuse hypokinesis. Severely dilated RV with mild to moderate systolic dysfunction. Mild left atrial enlargement, no LA appendage thrombus. Moderate to severe right atrial enlargement. There was moderate to severe tricuspid regurgitation. Mild to moderate mitral regurgitation. The aortic valve was trileaflet with severe calcification. There was moderate to severe aortic stenosis with mean gradient 37 mmHg and AVA 1.0 cm^2.   09/13/15 CTA: negative for PE, enlarging lung nodule noted.  Feels good today. Denies CP, SOB, lightheadedness, or dizziness. Wants to be sure she's ready before leaving the hospital. Plans on going to [facility].   Overall diuresed 41 pounds (28 L). Creatinine improved from 1.4 ->  1.09 with holding of diuretics yesterday.    Objective:   Weight Range:  Vital Signs:   Temp:  [97.4 F (36.3 C)-98.6 F (37 C)] 97.9 F (36.6 C) (05/02 0642) Pulse Rate:  [84-98] 84 (05/02 0642) Resp:  [12-20] 18 (05/02 0642) BP: (101-124)/(60-83) 110/60 mmHg (05/02 0642) SpO2:  [84 %-99 %] 97 % (05/02 0642) Weight:  [165 lb 3.2 oz (74.934 kg)] 165 lb 3.2 oz (74.934 kg) (05/02 0642) Last BM Date: 09/26/15  Weight change: Filed Weights   09/25/15 0418 09/26/15 0400 09/27/15 0642  Weight: 176 lb 2.4 oz (79.9 kg) 169 lb 5 oz (76.8 kg) 165 lb 3.2 oz (74.934 kg)    Intake/Output:   Intake/Output Summary (Last 24 hours) at 09/27/15 0732 Last data filed at 09/27/15 0054  Gross per 24 hour  Intake    960 ml  Output    715 ml  Net    245 ml     Physical Exam: General: NAD. No resp difficulty. Lying flat in  bed.  HEENT: normal Neck: supple. JVP flat Carotids 2+ bilat; no bruits. No thyromegaly or nodule noted. Cor: PMI nondisplaced. RRR. No rubs or gallops.   3/6 crescendo-decrescendo murmur RUSB with muffled S2. Lungs: RML RLL mildly decreased. On 4 liters oxygen Abdomen: soft, NT, ND, no HSM. No bruits or masses. +BS  Extremities: no cyanosis, clubbing, rash. No edema.  Neuro: confused , cranial nerves grossly intact. moves all 4 extremities w/o difficulty. Affect pleasant GU: Foley   Telemetry: Reviewed telemetry, NSR 80-90s  Labs: Basic Metabolic Panel:  Recent Labs Lab 09/23/15 0432  09/24/15 0400 09/24/15 1650 09/25/15 0357 09/26/15 0610 09/26/15 1657 09/27/15 0216  NA 132*  < > 132* 133* 134* 136 130* 131*  K 2.4*  < >  2.8* 3.4* 3.1* 3.2* 3.7 3.5  CL 67*  < > 75* 76* 80* 86* 87* 89*  CO2 >50*  < > 45* 43* 41* 37* 32 30  GLUCOSE 144*  < > 127* 81 123* 133* 163* 121*  BUN 53*  < > 65* 62* 62* 49* 44* 39*  CREATININE 1.42*  < > 1.39* 1.41* 1.29* 1.19* 1.41* 1.09*  CALCIUM 9.9  < > 9.5 9.6 9.7 9.8 9.4 9.4  MG 1.9  --  1.7  --  1.8 1.6* 3.1*  --   PHOS  --    --  3.9  --   --   --   --   --   < > = values in this interval not displayed.  Liver Function Tests:  Recent Labs Lab 09/20/15 1657 09/21/15 0355 09/22/15 0405 09/24/15 0400 09/26/15 0610  AST  --  35 42*  --  23  ALT  --  21 29  --  17  ALKPHOS  --  53 68  --  63  BILITOT  --  1.3* 1.3*  --  0.9  PROT 6.5 7.1 8.0  --  7.6  ALBUMIN  --  3.4* 3.9 3.6 3.6   No results for input(s): LIPASE, AMYLASE in the last 168 hours. No results for input(s): AMMONIA in the last 168 hours.  CBC:  Recent Labs Lab 09/22/15 0405 09/23/15 0432 09/24/15 0400 09/25/15 0357 09/26/15 0610 09/27/15 0216  WBC 7.9 8.3 8.1 9.1 9.1 8.8  NEUTROABS 7.1  --  5.5  --  5.9  --   HGB 9.9* 10.1* 10.2* 11.4* 11.9* 11.5*  HCT 37.4 37.2 38.0 40.6 43.4 40.9  MCV 77.8* 77.5* 77.6* 78.2 77.8* 76.0*  PLT 241 254 302 305 295 313    Cardiac Enzymes: No results for input(s): CKTOTAL, CKMB, CKMBINDEX, TROPONINI in the last 168 hours.  BNP: BNP (last 3 results)  Recent Labs  11/18/14 1015 09/07/15 2000  BNP 176.5* 848.6*    ProBNP (last 3 results) No results for input(s): PROBNP in the last 8760 hours.    Other results:  Imaging: Dg Chest 2 View  09/25/2015  CLINICAL DATA:  Cough; right pleural effusion; H/o DM, COPD, and MI; former smoker EXAM: CHEST  2 VIEW COMPARISON:  09/21/2015 FINDINGS: Stable right PICC line. Cardiac enlargement stable. Hazy airspace disease right lower lobe again identified, improved when compared to prior study. Mild left lower lobe atelectasis. Vascular pattern within normal limits. No evidence of pulmonary edema. IMPRESSION: Persistent but improved right base airspace disease. Electronically Signed   By: Skipper Cliche M.D.   On: 09/25/2015 15:50     Medications:     Scheduled Medications: . antiseptic oral rinse  7 mL Mouth Rinse BID  . aspirin  81 mg Oral Daily  . atorvastatin  20 mg Oral Daily  . darifenacin  7.5 mg Oral Daily  . DULoxetine  60 mg Oral  Daily  . ferrous sulfate  325 mg Oral BID WC  . gabapentin  100 mg Oral TID  . guaiFENesin  600 mg Oral BID  . heparin subcutaneous  5,000 Units Subcutaneous Q8H  . insulin aspart  0-15 Units Subcutaneous TID WC  . insulin aspart  0-5 Units Subcutaneous QHS  . insulin glargine  15 Units Subcutaneous QHS  . mometasone-formoterol  2 puff Inhalation BID  . pantoprazole  40 mg Oral Daily  . polyethylene glycol  17 g Oral Daily  . potassium chloride  20  mEq Oral BID  . senna-docusate  1 tablet Oral QHS  . sodium chloride flush  3 mL Intravenous Q12H  . spironolactone  25 mg Oral Daily  . vitamin B-12  1,000 mcg Oral Daily    Infusions:    PRN Medications: sodium chloride, acetaminophen, ALPRAZolam, ipratropium-albuterol, ondansetron (ZOFRAN) IV, sodium chloride flush, sodium chloride flush, traMADol   Assessment/Plan/Discussion     1. 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 TEE 4/27 with EF 45-50%, D-shaped interventricular septum, severely dilated RV with mild to moderate systolic dysfunction, moderate to severe AS.  She was started on dobutamine for RV support.  Off dobutamine Friday 4/28. Volume status stable. - She did not have diuretics (other than spiro) PTA. Will start back on torsemide 20 mg daily and gauge response. Can titrate as needed. Overall diuresed 41 pounds.  - Creatinine much improved with holding diuretics yesterday.  2. Aortic stenosis: TEE done, AS is moderate to severe by numbers with mean gradient 37 and AVA 1.0 cm^2 => moderate to severe stenosis (not critical).  Dr. Aundra Dubin reviewed her TEE with Dr Burt Knack.  While we suspect that the valve is hemodynamically significant, her main problem here seems to be severe right heart failure.  She also has moderate to probably severe TR.  The right heart problems will likely not go away with TAVR, and we would be exposing her to a high risk procedure (may not be able to come off  vent). For now, continue medical management of her RV failure. Now that she seems more compensated, will review TAVR again with Dr Roxy Manns.  - Discussed with Dr Roxy Manns. Her aortic valve shows borderline severe stenosis, not critical.  Her main problem is RV failure that will not be significantly improved by TAVR, and TAVR would expose her to significant risks. Plan to continue medical treatment for now.  No cath.   3. Pulmonary hypertension: She has a history of pulmonary hypertension thought to be due to OHS/OSA. Suspect her Braden currently is a combination of OHS/OSA and pulmonary venous hypertension from volume overload but now euvolemic.   4. OHS/OSA: She is on high dose home O2 chronically. Had not been able to tolerate CPAP in the past but doing ok with it here.  Will have her wear CPAP at night.  5. COPD: On home O2, no longer smokes. She is now back on her chronic home oxygen (4-5 L by Rich).  6. CAD: h/o RCA PCI. Stable.  Continue ASA 81 and statin.  7. Tricuspid regurgitation: Moderate to probably severe in setting of severe RV dilation.  8. Pulmonary nodule: Enlarging pulmonary nodule concerning for bronchogenic carcinoma, especially with smoking history. This will have to be addressed after her heart issues are stabilized. Will need PET scan after cardiac issues stabilized (must be done as outpatient).  - Thoracentesis on 4/25, bloody fluid sent for cytology, fluid negative for malignant cells. - She will need outpatient workup of concerning lung mass => PET/CT and pulmonary followup.  9. AKI on CKD:  - Stable. Cr 1.09 today. (down from 1.4 at recheck yesterday) 10. Alkalosis: Primarily due to chronic CO2 retention +/- diuresis. Off acetazolamide now 11. Hypokalemia: Continue to supp. Continue spiro 25 mg daily. 12. Delirium: Confusion at times, worse at night. Improved  13. Hypomagnesium: Mag 3.1 this am.  13. Disposition: Will need SNF. She is DNR/DNI.   - No further procedures planned.  Likely to SNF tomorrow. Needs continued  work with PT.   Shirley Friar PA-C 09/27/2015 7:32 AM   Advanced Heart Failure Team Pager [phone] (M-F; 7a - 4p)  Please contact [facility] for night-coverage after hours (4p -7a ) and weekends on [email or web address]  Patient seen with PA, agree with the above note.  She looks euvolemic now.  Can restart torsemide 40 daily, this will likely be her home dose. As per discussion above, no TAVR given primacy of RV failure and only moderate-severe AS as well as tenuous pulmonary status.  Workup for lung cancer will be done as outpatient.   She may go to [facility].   Loralie Champagne 09/27/2015 12:17 PM

## 2015-09-27 NOTE — Clinical Social Work Note (Signed)
CSW contact patient's brother, Pete Pelt ([phone]) to make him aware that patient will likely be discharging to [facility]. CSW also informed him that CSW will facilitate transportation by [facility]. CSW told patient's brother that time of discharge is unknown at this time but he will be contacted once that information is available.  CSW notified Rollene Fare at [facility] that she will likely be discharging tomorrow.  [place] Scrape, [place]

## 2015-09-28 ENCOUNTER — Encounter: Payer: Self-pay | Admitting: Adult Health

## 2015-09-28 ENCOUNTER — Non-Acute Institutional Stay (SKILLED_NURSING_FACILITY): Payer: Medicare Other | Admitting: Adult Health

## 2015-09-28 DIAGNOSIS — J9622 Acute and chronic respiratory failure with hypercapnia: Secondary | ICD-10-CM | POA: Diagnosis not present

## 2015-09-28 DIAGNOSIS — I350 Nonrheumatic aortic (valve) stenosis: Secondary | ICD-10-CM | POA: Insufficient documentation

## 2015-09-28 DIAGNOSIS — R911 Solitary pulmonary nodule: Secondary | ICD-10-CM

## 2015-09-28 DIAGNOSIS — E1129 Type 2 diabetes mellitus with other diabetic kidney complication: Secondary | ICD-10-CM | POA: Insufficient documentation

## 2015-09-28 DIAGNOSIS — I5033 Acute on chronic diastolic (congestive) heart failure: Secondary | ICD-10-CM | POA: Diagnosis not present

## 2015-09-28 DIAGNOSIS — K219 Gastro-esophageal reflux disease without esophagitis: Secondary | ICD-10-CM

## 2015-09-28 DIAGNOSIS — N182 Chronic kidney disease, stage 2 (mild): Secondary | ICD-10-CM

## 2015-09-28 DIAGNOSIS — E1122 Type 2 diabetes mellitus with diabetic chronic kidney disease: Secondary | ICD-10-CM

## 2015-09-28 DIAGNOSIS — F411 Generalized anxiety disorder: Secondary | ICD-10-CM

## 2015-09-28 DIAGNOSIS — J432 Centrilobular emphysema: Secondary | ICD-10-CM

## 2015-09-28 DIAGNOSIS — M797 Fibromyalgia: Secondary | ICD-10-CM

## 2015-09-28 LAB — MAGNESIUM: MAGNESIUM: 1.8 mg/dL (ref 1.7–2.4)

## 2015-09-28 LAB — BASIC METABOLIC PANEL
ANION GAP: 9 (ref 5–15)
BUN: 36 mg/dL — ABNORMAL HIGH (ref 6–20)
CHLORIDE: 93 mmol/L — AB (ref 101–111)
CO2: 32 mmol/L (ref 22–32)
Calcium: 9.3 mg/dL (ref 8.9–10.3)
Creatinine, Ser: 1.07 mg/dL — ABNORMAL HIGH (ref 0.44–1.00)
GFR calc non Af Amer: 51 mL/min — ABNORMAL LOW (ref >60)
GFR, EST AFRICAN AMERICAN: 59 mL/min — AB (ref >60)
Glucose, Bld: 142 mg/dL — ABNORMAL HIGH (ref 65–99)
Potassium: 3.7 mmol/L (ref 3.5–5.1)
Sodium: 134 mmol/L — ABNORMAL LOW (ref 135–145)

## 2015-09-28 LAB — GLUCOSE, CAPILLARY
GLUCOSE-CAPILLARY: 183 mg/dL — AB (ref 65–99)
Glucose-Capillary: 139 mg/dL — ABNORMAL HIGH (ref 65–99)

## 2015-09-28 MED ORDER — ASPIRIN 81 MG PO CHEW: 81.0000 mg | CHEWABLE_TABLET | Freq: Every day | ORAL | Status: AC

## 2015-09-28 MED ORDER — GABAPENTIN 100 MG PO CAPS: 100.0000 mg | ORAL_CAPSULE | Freq: Three times a day (TID) | ORAL | Status: DC

## 2015-09-28 MED ORDER — POTASSIUM CHLORIDE CRYS ER 20 MEQ PO TBCR: 20.0000 meq | EXTENDED_RELEASE_TABLET | Freq: Every day | ORAL | Status: DC

## 2015-09-28 MED ORDER — FERROUS SULFATE 325 (65 FE) MG PO TABS: 325.0000 mg | ORAL_TABLET | Freq: Two times a day (BID) | ORAL | Status: AC

## 2015-09-28 MED ORDER — TORSEMIDE 20 MG PO TABS: 40.0000 mg | ORAL_TABLET | Freq: Every day | ORAL | Status: DC

## 2015-09-28 MED ORDER — TRAMADOL HCL 50 MG PO TABS: 50.0000 mg | ORAL_TABLET | Freq: Two times a day (BID) | ORAL | Status: DC | PRN

## 2015-09-28 NOTE — Clinical Social Work Note (Signed)
CSW facilitated patient discharge including contacting patient family and facility to confirm patient discharge plans. Clinical information faxed to facility and family agreeable with plan. CSW arranged ambulance transport via [facility] to [facility]. RN to call report prior to discharge.  CSW will sign off for now as social work intervention is no longer needed. Please consult [place] again if new needs arise.  [place] Scrape, [place]

## 2015-09-28 NOTE — Clinical Social Work Placement (Signed)
   CLINICAL SOCIAL WORK PLACEMENT  NOTE  Date:  09/28/2015  Patient Details  Name: Jillian Bright MRN: [id number] Date of Birth: 25-Feb-1946  Clinical Social Work is seeking post-discharge placement for this patient at the [place] level of care (*CSW will initial, date and re-position this form in  chart as items are completed):  Yes   Patient/family provided with [place] Work Department's list of facilities offering this level of care within the geographic area requested by the patient (or if unable, by the patient's family).  Yes   Patient/family informed of their freedom to choose among providers that offer the needed level of care, that participate in Medicare, Medicaid or managed care program needed by the patient, have an available bed and are willing to accept the patient.  Yes   Patient/family informed of [facility]'s ownership interest in [facility] and [facility], as well as of the fact that they are under no obligation to receive care at these facilities.  PASRR submitted to EDS on 09/09/15     PASRR number received on       Existing PASRR number confirmed on 09/09/15     FL2 transmitted to all facilities in geographic area requested by pt/family on 09/09/15     FL2 transmitted to all facilities within larger geographic area on       Patient informed that his/her managed care company has contracts with or will negotiate with certain facilities, including the following:        Yes   Patient/family informed of bed offers received.  Patient chooses bed at [facility]     Physician recommends and patient chooses bed at      Patient to be transferred to [facility] on 09/28/15.  Patient to be transferred to facility by [facility]     Patient family notified on 09/28/15 of transfer.  Name of family member notified:  Shanon Brow     PHYSICIAN Please sign FL2, Please sign DNR, Please prepare prescriptions     Additional Comment:     _______________________________________________ Candie Chroman, LCSW 09/28/2015, 10:58 AM

## 2015-09-28 NOTE — Progress Notes (Signed)
Patient ID: Jillian Bright, female   DOB: 1946-01-04, 70 y.o.   MRN: [id number]    Facility:  [facility]     CODE STATUS: Full Code until further determined  Allergies  Allergen Reactions  . Codeine Itching    Takes vicodin at home    Chief Complaint  Patient presents with  . Hospitalization Follow-up     HPI:  She was hospitalized from 09-07-15 through 09-28-15 . She was hospitalized for acute on chronic diastolic heart failure; due to noncompliance at home; copd with chronic respiratory failure; left upper lobe nodule; newly found severe aortic stenosis and chronic renal disease. She is here for short term rehab. She desires to be transferred to a different snf. She was supposed to go to another snf; but that fell through. She will placed on their waiting list.    Past Medical History  Diagnosis Date  . Diabetes mellitus   . COPD (chronic obstructive pulmonary disease) ([place])     sees Dr. [name]  . Sleep apnea, obstructive   . Pulmonary HTN ([place])   . Obesity (BMI 30.0-34.9)   . Hyperlipidemia   . Myocardial infarction ([place])   . Coronary artery disease   . Anxiety   . Shortness of breath   . GERD (gastroesophageal reflux disease)   . Arthritis     rheumatoid, sees Dr. Tobie Lords  . CHF (congestive heart failure) ([place])   . Pneumonia   . Bronchitis     hx of   . History of hiatal hernia   . Acute on chronic diastolic (congestive) heart failure ([place]) 09/07/2015  . Aortic stenosis 01/14/2012  . Pleural effusion on right 09/07/2015  . Tricuspid regurgitation   . Right-sided congestive heart failure ([place])   . Mitral regurgitation     Past Surgical History  Procedure Laterality Date  . Appendectomy    . Tonsillectomy and adenoidectomy    . Lumbar disc surgery    . Hemorrhoid surgery    . Coronary angioplasty    . Endarterectomy Left 12/04/2012    Procedure: ENDARTERECTOMY CAROTID;  Surgeon: Mal Misty, MD;  Location: [facility];  Service: Vascular;  Laterality:  Left;  . Patch angioplasty Left 12/04/2012    Procedure: PATCH ANGIOPLASTY;  Surgeon: Mal Misty, MD;  Location: [facility];  Service: Vascular;  Laterality: Left;  Marland Kitchen Eye surgery      bilat cataract surg   . Colonscopy     . Total hip arthroplasty Left 07/30/2014    Procedure: LEFT TOTAL HIP ARTHROPLASTY ANTERIOR APPROACH;  Surgeon: Meredith Pel, MD;  Location: [facility];  Service: Orthopedics;  Laterality: Left;  . Tee without cardioversion N/A 09/21/2015    Procedure: TRANSESOPHAGEAL ECHOCARDIOGRAM (TEE);  Surgeon: Larey Dresser, MD;  Location: [facility];  Service: Cardiovascular;  Laterality: N/A;    Social History   Social History  . Marital Status: Widowed    Spouse Name: N/A  . Number of Children: N/A  . Years of Education: N/A   Occupational History  . used to work as a Theme park manager    Social History Main Topics  . Smoking status: Former Smoker -- 2.00 packs/day for 36 years    Types: Cigarettes    Quit date: 02/02/2013  . Smokeless tobacco: Never Used     Comment: had restarted and quit again 01/27/12   . Alcohol Use: 0.0 oz/week    0 Standard drinks or equivalent per week     Comment: rare  .  Drug Use: No  . Sexual Activity: Not on file   Other Topics Concern  . Not on file   Social History Narrative   Lives alone   Family History  Problem Relation Age of Onset  . Hypertension Mother   . Hyperlipidemia Mother   . Stroke Mother   . Heart attack Mother   . Cancer Mother   . Heart attack Father   . Cancer Brother   . Asthma Maternal Grandfather     Immunization History  Administered Date(s) Administered  . Influenza Split 04/03/2011, 02/21/2012, 05/25/2013  . Influenza Whole 04/10/2010  . Influenza,inj,Quad PF,36+ Mos 02/23/2014, 03/08/2015  . Pneumococcal Polysaccharide-23 04/10/2010     VITAL SIGNS BP 111/72 mmHg  Pulse 95  Temp(Src) 97.3 F (36.3 C) (Oral)  Resp 24  SpO2 95%  Patient's Medications  New Prescriptions   No medications on  file  Previous Medications   ALBUTEROL (PROVENTIL HFA;VENTOLIN HFA) 108 (90 BASE) MCG/ACT INHALER    Inhale 2 puffs into the lungs every 6 (six) hours as needed for wheezing or shortness of breath.   ASPIRIN 81 MG CHEWABLE TABLET    Chew 1 tablet (81 mg total) by mouth daily.   ATORVASTATIN (LIPITOR) 20 MG TABLET    Take 1 tablet (20 mg total) by mouth daily.   BUDESONIDE-FORMOTEROL (SYMBICORT) 160-4.5 MCG/ACT INHALER    Inhale 2 puffs into the lungs 2 (two) times daily.   DULOXETINE (CYMBALTA) 60 MG CAPSULE    Take 1 capsule (60 mg total) by mouth daily.   FERROUS SULFATE 325 (65 FE) MG TABLET    Take 1 tablet (325 mg total) by mouth 2 (two) times daily with a meal.   GABAPENTIN (NEURONTIN) 100 MG CAPSULE    Take 1 capsule (100 mg total) by mouth 3 (three) times daily.   GLIPIZIDE (GLUCOTROL) 10 MG TABLET    TAKE 1 TABLET (10 MG TOTAL) BY MOUTH 2 (TWO) TIMES DAILY BEFORE A MEAL.   METFORMIN (GLUCOPHAGE) 500 MG TABLET    Take 1 tablet (500 mg total) by mouth 3 (three) times daily.   OMEPRAZOLE (PRILOSEC) 40 MG CAPSULE    Take 1 capsule (40 mg total) by mouth daily before breakfast.   POTASSIUM CHLORIDE SA (K-DUR,KLOR-CON) 20 MEQ TABLET    Take 1 tablet (20 mEq total) by mouth daily.   SITAGLIPTIN (JANUVIA) 100 MG TABLET    Take 1 tablet (100 mg total) by mouth daily.   SOLIFENACIN (VESICARE) 10 MG TABLET    Take 1 tablet (10 mg total) by mouth daily.   SPIRONOLACTONE (ALDACTONE) 25 MG TABLET    Take 1 tablet (25 mg total) by mouth every morning.   TORSEMIDE (DEMADEX) 20 MG TABLET    Take 2 tablets (40 mg total) by mouth daily.   TRAMADOL (ULTRAM) 50 MG TABLET    Take 1 tablet (50 mg total) by mouth every 12 (twelve) hours as needed for severe pain.   VITAMIN B-12 (CYANOCOBALAMIN) 1000 MCG TABLET    Take 1 tablet (1,000 mcg total) by mouth daily.  Modified Medications   No medications on file  Discontinued Medications   No medications on file     SIGNIFICANT DIAGNOSTIC EXAMS  09-13-15: ct  angio of chest: Negative for pulmonary embolus. Left upper lobe pulmonary nodule has enlarged since the prior CT scan and could be a bronchogenic carcinoma. PET CT scan is recommended for further evaluation. Moderate right pleural effusion with associated compressive atelectasis. Marked cardiomegaly. Calcific  aortic and coronary atherosclerosis.  09-19-15: chest x-ray: Cardiac enlargement suggesting cardiomegaly as seen on 09/13/2015 CT scan. Vascular congestion with mild interstitial prominence suggests congestive heart failure with mild pulmonary edema. Stable small right effusion with underlying consolidation pair  09-21-15: 2-d echo: - Left ventricle: Normal LV size with D-shaped septum suggesting RV pressure/volume overload. LV EF 45-50%, diffuse hypokinesis. - Aortic valve: The aortic valve was trileaflet with severe calcification. There was moderate to severe aortic stenosis . - Aorta: Normal caliber aorta with grade III plaque in the descending thoracic aorta. - Mitral valve: There was mild to moderate regurgitation. - Left atrium: The atrium was mildly dilated. No evidence of  thrombus in the atrial cavity or appendage. - Right ventricle: The cavity size was severely dilated. Systolic  function was mildly to moderately reduced. - Right atrium: The atrium was moderately to severely dilated. - Atrial septum: No defect or patent foramen ovale was identified. - Tricuspid valve: Peak RV-RA gradient 22 mmHg. There was moderate regurgitation.  09-21-15: KUB: Normal small bowel gas pattern. Moderate colonic stool as described above. Mild levoscoliosis and degenerative changes lumbar spine.  09-21-15: chest x-ray: Small right pleural effusion with right basilar atelectasis or infiltrate. Right arm PICC line in place.   09-21-15: ct of chest: 1. Motion degraded exam. 2. Decrease in small right pleural effusion since the prior CT. Slight worsening right base airspace disease, most consistent with  infection or aspiration. 3. Patchy left base airspace disease is primarily felt to represent atelectasis. Concurrent infection cannot be excluded, especially laterally. 4. Cardiomegaly. Atherosclerosis, including within the coronary arteries. 5. Pulmonary artery enlargement suggests pulmonary arterial hypertension. 6. Aortic valvular calcifications could represent valvular disease. Echocardiography could be informative. 7. A left upper lobe pulmonary nodule is similar to on the most recent exam, and remains suspicious for primary bronchogenic carcinoma. Mild thoracic adenopathy which is indeterminate and could be reactive or metastatic. Recommend attention on follow-up.     LABS REVIEWED:   09-08-15: hgb a1c 7.3; vit B 12: 414; folate 18.2; iron 10; tibc 465; ferritin 8 09-14-15: glucose 165; bun 26; creat 1.08; k+ 4.4 ;na++135; liver normal albumin 3.3 09-18-15: wbc 5.7; hgb 8.8 ;hct 33.9; mcv 77.8 ;plt 169; glucose 28; creat 1.12; k+ 3.8; na++130; mag 1.2 09-21-15: wbc 5.9; hgb 8.5; hct 32.4; mcv 77.9 ;plt 202; glucose 206; bun 45; creat 1.58; k+ 2.8; na++13; mag 1.4; total bili 1.3; albumin 3.4; chol 125 09-24-15: wbc 81.; hgb 10.2; hct 38.0; mcv 77.6; plt 302; glucose 127; bun 65; create 1.39; k+ 2.8; na++132; phos 3.9; mag 1.7; albumin 3.2 09-28-15: glucose 142; bun 36; creat 1.07; k+ 3.7; na++134      Review of Systems  Constitutional: Negative for malaise/fatigue.  Respiratory: Negative for cough and shortness of breath.   Cardiovascular: Negative for chest pain, palpitations and leg swelling.  Gastrointestinal: Negative for heartburn, abdominal pain and constipation.  Musculoskeletal: Negative for myalgias, back pain and joint pain.  Skin: Negative.   Neurological: Negative for dizziness.  Psychiatric/Behavioral: The patient is not nervous/anxious.     Physical Exam  Constitutional: She is oriented to person, place, and time. No distress.     Eyes: Conjunctivae are normal.  Neck:  Neck supple. No JVD present. No thyromegaly present.  Cardiovascular: Normal rate, regular rhythm and intact distal pulses.   Respiratory: Effort normal and breath sounds normal. No respiratory distress. She has no wheezes.  02 dependent   GI: Soft. Bowel sounds are normal. She exhibits  no distension. There is no tenderness.  Musculoskeletal: She exhibits no edema.  Able to move all extremities   Lymphadenopathy:    She has no cervical adenopathy.  Neurological: She is alert and oriented to person, place, and time.  Skin: Skin is warm and dry. She is not diaphoretic.  Bilateral lower extremities wrapped in coban to prevent edema.   Psychiatric: She has a normal mood and affect.      ASSESSMENT/ PLAN:  1. Dyslipidemia: will continue lipitor 20 mg daily total chol 125  2. Diabetes: hgb a1c is 7.3. Will continue glucotrol 10 mg twice daily metformin 500 mg three times daily januvia 100 mg daily   3. Anemia with ckd stage II: hgb 10.2; will continue iron twice daily   4. Gerd: will continue prilosec 40 mg daily  5. UI: will continue vesicare 10 mg daily   6. Hypokalemia will continue 20 meq daily   7. COPD with chronic respiratory failure: is 02 dependent; will continue symbicort 160-4.5 mcg 2 puffs twice daily and has albuterol 2 puffs every 6 hours as needed  8.  Fibromyalgia: will continue neurontin 100 mg three times daily; her dose was lowered in the hospital;  takes cymbalta 60 mg daily has ultram 50 mg twice daily as needed her duragesic was stopped in the hospital   9. Anxiety: will continue cymbalta 60 mg daily  10. Acute on chronic diastolic heart failure: EF is 45-50%; will continue demadex 40 mg daily aldactone 25 mg daily asa 81 mg daily  will continue daily weights  11. Left upper lobe nodule: will need to follow up with pulmonology will monitor  12. Severe aortic stenosis: is being managed medically will monitor    Time spent with patient  50   minutes >50%  time spent counseling; reviewing medical record; tests; labs; and developing future plan of care   Ok Edwards NP [facility]  Contact [phone] Monday through Friday 8am- 5pm  After hours call [phone]

## 2015-09-28 NOTE — Clinical Social Work Placement (Signed)
   CLINICAL SOCIAL WORK PLACEMENT  NOTE  Date:  09/28/2015  Patient Details  Name: Jillian Bright MRN: [id number] Date of Birth: 17-Mar-1946  Clinical Social Work is seeking post-discharge placement for this patient at the [place] level of care (*CSW will initial, date and re-position this form in  chart as items are completed):  Yes   Patient/family provided with [facility] [place] Work Department's list of facilities offering this level of care within the geographic area requested by the patient (or if unable, by the patient's family).  Yes   Patient/family informed of their freedom to choose among providers that offer the needed level of care, that participate in Medicare, Medicaid or managed care program needed by the patient, have an available bed and are willing to accept the patient.  Yes   Patient/family informed of [facility]'s ownership interest in [facility] and [facility], as well as of the fact that they are under no obligation to receive care at these facilities.  PASRR submitted to EDS on 09/09/15     PASRR number received on       Existing PASRR number confirmed on 09/09/15     FL2 transmitted to all facilities in geographic area requested by pt/family on 09/09/15     FL2 transmitted to all facilities within larger geographic area on       Patient informed that his/her managed care company has contracts with or will negotiate with certain facilities, including the following:        Yes   Patient/family informed of bed offers received.  Patient chooses bed at [facility]     Physician recommends and patient chooses bed at      Patient to be transferred to [facility] on 09/28/15.  Patient to be transferred to facility by [facility]     Patient family notified on 09/28/15 of transfer.  Name of family member notified:  Shanon Brow     PHYSICIAN Please sign FL2, Please sign DNR, Please prepare  prescriptions     Additional Comment:    _______________________________________________ Candie Chroman, LCSW 09/28/2015, 11:17 AM

## 2015-09-28 NOTE — Discharge Summary (Signed)
Discharge Summary  Jillian Bright FIE:[id number] DOB: 02-09-1946  PCP: [name], MD  Admit date: 09/07/2015 Discharge date: 09/28/2015  Time spent: 42 minutes    Recommendations for Outpatient Follow-up:  1. Pulmonary for f/u on nodule, PET scan recommended. 2. Cardiology with 2 weeks. 3. Cardiothoracic surgery after PET scan.  Discharge Diagnoses:  Active Hospital Problems   Diagnosis Date Noted  . Acute on chronic diastolic (congestive) heart failure ([place]) 09/07/2015  . Solitary pulmonary nodule   . COPD exacerbation ([place])   . Hypoxemia   . Vitamin B12 deficiency 09/23/2015  . Acute encephalopathy 09/22/2015  . Acute-on-chronic respiratory failure ([place]) 09/22/2015  . Lung nodule   . OSA (obstructive sleep apnea)   . Morbid obesity ([place])   . Tricuspid regurgitation   . Right-sided congestive heart failure ([place])   . Mitral regurgitation   . Abnormal chest x-ray   . Acute on chronic congestive heart failure ([place])   . On home oxygen therapy 09/07/2015  . Microcytic anemia 09/07/2015  . Pleural effusion on right 09/07/2015  . DM type 2 (diabetes mellitus, type 2) ([place]) 11/18/2014  . Aortic stenosis 01/14/2012  . CAD (coronary artery disease) 11/07/2011  . Obesity hypoventilation syndrome ([place]) 06/10/2009  . COPD with emphysema ([place]) 05/30/2009  . Pulmonary HTN ([place]) 05/25/2009  . Essential hypertension 08/04/2007    Resolved Hospital Problems   Diagnosis Date Noted Date Resolved  No resolved problems to display.    Discharge Condition: Stable   Diet recommendation: Cardiac   Filed Vitals:   09/27/15 2035 09/28/15 0518  BP: 101/63 116/70  Pulse: 92 94  Temp: 98 F (36.7 C) 98.6 F (37 C)  Resp: 16 17    History of present illness:  Patient was admitted on 09/07/2015, with complaint of worsening shortness of breath and orthopnea, was found to have acute on chronic diastolic CHF with significant volume overload. Patient was started on IV Lasix and was  diuresed. On 09/12/2015 due to persistent CHF exacerbation and hypotension, Cardiology was consulted and the patient was started on dopamine after a PICC line insertion.  On 09/14/2015 on pulmonary was consulted as well for suspected pulmonary nodule as well as pleural effusion. The patient underwent right thoracentesis with bloody pleural fluid 600 mL drained. Pleural fluid cytology is negative for malignancy, culture is also negative. 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 Patient will need outpatient PET scan. Echocardiogram showed moderate to severe aortic stenosis and cardiac thoracic surgery was consulted. Dopamine was started and the patient was given intermittent Lasix with Zaroxolyn. On April 24 patient had significant increase in oxygen requirement with hypotension and therefore was placed on dobutamine with Lasix infusion. On 09/21/2015 TEE showed moderate aortic stenosis. Dobutamine has been stopped on 09/23/2015. Torsemide was started and the patient has been stable on this.  After stopping the fentanyl patch the patient had significant improvement in mental status   Hospital Course:  1. Acute on chronic diastolic (congestive) heart failure (HCC) Probably worsening due to noncompliance as an outpatient Cardiology as well as pulmonary has been consulted on the patient. Patient status post right pleural effusion thoracentesis. Initially was on dopamine drip, later on dobutamine drip as well as Lasix infusion, now discontinued since 09/23/2015 No foley, but weights have been improving. Renal function stable. Plan to d/c today on Torsemide 40 mg PO daily. Monitor daily weight and ins and outs. Appreciate cardiology input.  Patient is also started on Aldactone.  2. COPD with chronic respiratory failure. Acute on chronic hypoxic respiratory failure, home oxygen 3 L  Right-sided pleural effusion. Exudative Left  upper pulmonary nodule. Patient underwent thoracentesis of the right pleural effusion.  Negative culture as well as cytology of the pleural fluid. Repeat CT scan shows evidence of atelectasis versus possible infiltrate although the patient is not exhibiting any signs of infection. ABG shows mixed respiratory acidosis and metabolic alkalosis, BiPAP when necessary ordered, but never needed. Completed prednisone for 3 days. Repeat chest x-ray on 09/25/2015 does not show any reaccumulation of the fluid Currently requiring 4 L of nasal cannula to maintain saturation more than 94%. Continue incentive spirometry.Flutter device for pulmonary toilet Pulmonary has also signed off. Appreciate input. Will need outpatient follow-up as well as outpatient PET scan.  3. Aortic stenosis. Moderate to severe based on TEE. Cardiology feels that the patient would not be an ideal candidate for TAVR, since she has significant right-sided heart failure with severe TR and would not add any benefit from the surgery symptomatically. Appreciate input from cardiac thoracic surgery who would like to medically manage the patient. Also patient's left pulmonary nodule requiring further workup and possibility of bronchogenic carcinoma would also limit feasibility of any surgical or interventional procedure for aortic stenosis. Patient will need outpatient follow-up with cardiac thoracic surgery after PET scan.  4. Acute on chronic kidney injury. Chronic kidney disease stage II. Renal function mildly worsened during the hospitalization but improved after initiation of the dobutamine drip. Likely cardiorenal hemodynamics. Renal function continues to improve despite stopping dobutamine drip.  Continue to monitor. Patient will need outpatient BMP  4. Hypokalemia. Replacing. Scheduled potassium 20 mg twice a day Hypomagnesemia. Resolved  5. Right-sided chest pain as well as right upper quadrant pain. Resolved LFTs are  normal. Likely related to the pleural effusion and atelectasis. CT scan is not showing any acute abnormality on the upper abdomen.  6. Microcytic anemia. Likely associated with iron deficiency. We will initiate iron supplementation. Further GI evaluation as an outpatient. Status post PRBC on April 16.  7. Type 2 diabetes mellitus. Hemoglobin A1c 7.3 on 09/08/2015. Holding home metformin in the hospital. Also glipizide has been on hold. We will increase the sliding scale to moderate. With fasting blood sugar 168 increase Lantus to 15 units Add 3 units of pre-meal coverage  8. Pain management. Acute encephalopathy Likely from fentanyl patch gradually improving. Also gradually tapering off the gabapentin from 300 mg 3 times a day to 100 mg 3 times a day.  9. Metabolic alkalosis Likely from aggressive diuresis. Almost resolved Also has chronic respiratory compensatory alkalosis at her baseline due to her COPD. Diamox was initially used but since now metabolic alkalosis has resolved it is discontinued.  10 vitamin B 12 deficiency. B-12 414,  Low normal side. Patient would benefit from supplementation.   Procedures: Right thoracentesis 09/20/2015 TEE 09/21/2015, echocardiogram April 13   Consultations:  Cardiology, Pulmonary, Cardiothoracic surgery   Discharge Exam: BP 116/70 mmHg  Pulse 94  Temp(Src) 98.6 F (37 C) (Oral)  Resp 17  Ht '5\' 6"'$  (1.676 m)  Wt 75.116 kg (165 lb 9.6 oz)  BMI 26.74 kg/m2  SpO2 96%  General: In no distress, sitting up in chair on [place] o2 this AM. Brother at the bedside.  Cardiovascular: S1 and S2 Present, aortic systolic Murmur,no JVD Respiratory: Bilateral Air entry present and Resolving Crackles, no wheezes Abdomen: Bowel Sound present, Soft and no tenderness  Extremities: Almost no bilateral Pedal edema, no calf tenderness Neurology: No focal deficit,  Discharge Instructions You were cared for by a hospitalist during your hospital  stay. If you have any questions about your discharge medications or the care you received while you were in the hospital after you are discharged, you can call the unit and asked to speak with the hospitalist on call if the hospitalist that took care of you is not available. Once you are discharged, your primary care physician will handle any further medical issues. Please note that NO REFILLS for any discharge medications will be authorized once you are discharged, as it is imperative that you return to your primary care physician (or establish a relationship with a primary care physician if you do not have one) for your aftercare needs so that they can reassess your need for medications and monitor your lab values.  Discharge Instructions    Diet - low sodium heart healthy    Complete by:  As directed      Discharge instructions    Complete by:  As directed   Heart Failure Follow-up Care:  Verify follow-up appointments per Patient Discharge Instructions. Confirm transportation arranged. Reconcile home medications with discharge medication list. Remove discontinued medications from use. Assist patient/caregiver to manage medications using pill box. Reinforce low sodium food selection Assessments: Vital signs and oxygen saturation at each visit. Assess home environment for safety concerns, caregiver support and availability of low-sodium foods. Consult Education officer, museum, PT/OT, Dietitian, and CNA based on assessments. Perform comprehensive cardiopulmonary assessment. Notify MD for any change in condition or weight gain of 3 pounds in one day or 5 pounds in one week with symptoms. Daily Weights and Symptom Monitoring: Ensure patient has access to scales. Teach patient/caregiver to weigh daily before breakfast and after voiding using same scale and record.    Teach patient/caregiver to track weight and symptoms and when to notify Provider. Activity: Develop individualized activity plan with  patient/caregiver.     Heart Failure patients record your daily weight using the same scale at the same time of day    Complete by:  As directed      Increase activity slowly    Complete by:  As directed             Medication List    STOP taking these medications        ALPRAZolam 0.5 MG tablet  Commonly known as:  XANAX     OVER THE COUNTER MEDICATION     traZODone 50 MG tablet  Commonly known as:  DESYREL      TAKE these medications        albuterol 108 (90 Base) MCG/ACT inhaler  Commonly known as:  PROVENTIL HFA;VENTOLIN HFA  Inhale 2 puffs into the lungs every 6 (six) hours as needed for wheezing or shortness of breath.     aspirin 81 MG chewable tablet  Chew 1 tablet (81 mg total) by mouth daily.     atorvastatin 20 MG tablet  Commonly known as:  LIPITOR  Take 1 tablet (20 mg total) by mouth daily.     budesonide-formoterol 160-4.5 MCG/ACT inhaler  Commonly known as:  SYMBICORT  Inhale 2 puffs into the lungs 2 (two) times daily.     DULoxetine 60 MG capsule  Commonly known as:  CYMBALTA  Take 1 capsule (60 mg total) by mouth daily.     ferrous sulfate 325 (65 FE) MG tablet  Take 1 tablet (325 mg  total) by mouth 2 (two) times daily with a meal.     gabapentin 300 MG capsule  Commonly known as:  NEURONTIN  TAKE ONE CAPSULE 3 TIMES A DAY     gabapentin 100 MG capsule  Commonly known as:  NEURONTIN  Take 1 capsule (100 mg total) by mouth 3 (three) times daily.     glipiZIDE 10 MG tablet  Commonly known as:  GLUCOTROL  TAKE 1 TABLET (10 MG TOTAL) BY MOUTH 2 (TWO) TIMES DAILY BEFORE A MEAL.     metFORMIN 500 MG tablet  Commonly known as:  GLUCOPHAGE  Take 1 tablet (500 mg total) by mouth 3 (three) times daily.     omeprazole 40 MG capsule  Commonly known as:  PRILOSEC  Take 1 capsule (40 mg total) by mouth daily before breakfast.     potassium chloride SA 20 MEQ tablet  Commonly known as:  K-DUR,KLOR-CON  Take 1 tablet (20 mEq total) by mouth  daily.     sitaGLIPtin 100 MG tablet  Commonly known as:  JANUVIA  Take 1 tablet (100 mg total) by mouth daily.     solifenacin 10 MG tablet  Commonly known as:  VESICARE  Take 1 tablet (10 mg total) by mouth daily.     spironolactone 25 MG tablet  Commonly known as:  ALDACTONE  Take 1 tablet (25 mg total) by mouth every morning.     torsemide 20 MG tablet  Commonly known as:  DEMADEX  Take 2 tablets (40 mg total) by mouth daily.     traMADol 50 MG tablet  Commonly known as:  ULTRAM  Take 1 tablet (50 mg total) by mouth every 12 (twelve) hours as needed for severe pain.     vitamin B-12 1000 MCG tablet  Commonly known as:  CYANOCOBALAMIN  Take 1 tablet (1,000 mcg total) by mouth daily.       Allergies  Allergen Reactions  . Codeine Itching    Takes vicodin at home       Follow-up Information    Follow up with Darrick Grinder, NP On 10/04/2015.   Specialty:  Cardiology   Why:  [facility] Failure [facility] on the first floor of [facility] [address]   Contact information:   [address] [place] [address] [phone]       Follow up with [facility].   Specialty:  Skilled Nursing Facility   Contact information:   [place] [phone]      Follow up with [name], MD. Schedule an appointment as soon as possible for a visit in 2 weeks.   Specialty:  Family Medicine   Why:  Pt needs outpatient PET Scan, check labs.   Contact information:   [place] [address] [phone]        The results of significant diagnostics from this hospitalization (including imaging, microbiology, ancillary and laboratory) are listed below for reference.    Significant Diagnostic Studies: Dg Chest 2 View  09/25/2015  CLINICAL DATA:  Cough; right pleural effusion; H/o DM, COPD, and MI; former smoker EXAM: CHEST  2 VIEW COMPARISON:  09/21/2015 FINDINGS: Stable right PICC line. Cardiac enlargement  stable. Hazy airspace disease right lower lobe again identified, improved when compared to prior study. Mild left lower lobe atelectasis. Vascular pattern within normal limits. No evidence of pulmonary edema. IMPRESSION: Persistent but improved right base airspace disease. Electronically Signed   By: Kyung Rudd  Rubner M.D.   On: 09/25/2015 15:50   Dg Chest 2 View  09/08/2015  CLINICAL DATA:  Followup for abnormal chest radiograph. Patient with leg swelling and shortness of breath since yesterday. History of COPD. EXAM: CHEST  2 VIEW COMPARISON:  09/07/2015 FINDINGS: Since the previous day's exam, there has been no significant change. Cardiac silhouette is mildly enlarged. There is right lung base opacity obscuring the right heart border right hemidiaphragm consistent with a combination of a moderate pleural effusion with either atelectasis or infiltrate. Lungs are hyperexpanded. There are irregularly thickened interstitial markings which are similar to the prior study. Interstitial markings are mildly increased when compared to a study dated 07/21/2014. IMPRESSION: 1. No change from the previous day's study. 2. Right basilar opacity reflecting a moderate pleural effusion with either atelectasis, pneumonia or a combination. 3. Mild cardiomegaly. Irregular interstitial thickening. A component of congestive heart failure should be considered. Electronically Signed   By: Lajean Manes M.D.   On: 09/08/2015 11:31   Dg Chest 2 View  09/07/2015  CLINICAL DATA:  Shortness of breath with bilateral leg swelling. EXAM: CHEST  2 VIEW COMPARISON:  11/17/2014 FINDINGS: There is cardiomegaly. Small to moderate right pleural effusion with right lower lobe atelectasis. No confluent opacity or effusion on the left. No overt edema. No acute bony abnormality. IMPRESSION: Cardiomegaly. Right lower lobe atelectasis or infiltrate with small to moderate right pleural effusion. Electronically Signed   By: Rolm Baptise M.D.   On:  09/07/2015 21:42   Ct Chest Wo Contrast  09/21/2015  CLINICAL DATA:  Followup of pleural effusion. COPD. Shortness of breath. Pulmonary hypertension. Pneumonia. Bronchitis. EXAM: CT CHEST WITHOUT CONTRAST TECHNIQUE: Multidetector CT imaging of the chest was performed following the standard protocol without IV contrast. COMPARISON:  09/13/2015 PE study.  Chest radiograph of 09/21/2015. FINDINGS: Mediastinum/Nodes: A right-sided PICC line which terminates at the mid to low SVC. Aortic and branch vessel atherosclerosis. Moderate cardiomegaly. Multivessel coronary artery atherosclerosis. Aortic valvular calcifications. Pulmonary artery enlargement, including a 3.5 cm outflow tract. Mediastinal adenopathy, including a similar 11 mm precarinal node. Hilar regions poorly evaluated without intravenous contrast. Lungs/Pleura: Decrease in small right pleural effusion since the prior PET. Mild motion degradation.  Mild centrilobular emphysema. Persistent right base airspace disease, minimally progressive in the superior segment. Left upper lobe 1.2 cm nodule on image 26/series 9, similar to the most recent CT. Worsened left-sided aeration with primarily dependent airspace disease. Upper abdomen: Normal imaged portions of the liver, spleen, stomach, adrenal glands, gallbladder, and left kidney. Musculoskeletal: lucent lesion within the T3 vertebral body was present back in 2015 and can be considered benign. Likely a hemangioma. IMPRESSION: 1. Motion degraded exam. 2. Decrease in small right pleural effusion since the prior CT. Slight worsening right base airspace disease, most consistent with infection or aspiration. 3. Patchy left base airspace disease is primarily felt to represent atelectasis. Concurrent infection cannot be excluded, especially laterally. 4. Cardiomegaly. Atherosclerosis, including within the coronary arteries. 5. Pulmonary artery enlargement suggests pulmonary arterial hypertension. 6. Aortic valvular  calcifications could represent valvular disease. Echocardiography could be informative. 7. A left upper lobe pulmonary nodule is similar to on the most recent exam, and remains suspicious for primary bronchogenic carcinoma. Mild thoracic adenopathy which is indeterminate and could be reactive or metastatic. Recommend attention on follow-up. Electronically Signed   By: Abigail Miyamoto M.D.   On: 09/21/2015 18:48   Ct Angio Chest Pe W/cm &/or Wo Cm  09/13/2015  CLINICAL DATA:  Shortness of breath for 2 months. Subsequent encounter. EXAM: CT ANGIOGRAPHY CHEST WITH CONTRAST TECHNIQUE: Multidetector CT imaging of the chest was performed using the standard protocol during bolus administration of intravenous contrast. Multiplanar CT image reconstructions and MIPs were obtained to evaluate the vascular anatomy. CONTRAST:  100 cc Isovue 370. COMPARISON:  Single view of the chest 11/17/2014. PA and lateral chest 09/08/2015. FINDINGS: No pulmonary embolus is identified. The patient has a moderate right pleural effusion. There is no left pleural effusion or pericardial effusion. Marked cardiomegaly is noted. Calcific aortic and coronary atherosclerosis is seen. No axillary, hilar or mediastinal lymphadenopathy by CT size criteria is seen. 0.9 cm prevascular node on image 36 is noted. Lungs demonstrate emphysematous disease. A 1.1 cm AP x 0.9 cm transverse x 1.5 cm craniocaudal nodule on image 20 of series 6 and coronal image 83 measured 1.0 x 0.6 x 0.8 cm on the prior CT. The nodule is lobulated. The patient's right pleural effusion results in compressive atelectatic change. The lungs are otherwise unremarkable. No focal abnormality is seen in the imaged upper abdomen. No worrisome bony lesion is seen. Hemangiomas and upper thoracic vertebral bodies are unchanged. Review of the MIP images confirms the above findings. IMPRESSION: Negative for pulmonary embolus. Left upper lobe pulmonary nodule has enlarged since the prior CT  scan and could be a bronchogenic carcinoma. PET CT scan is recommended for further evaluation. Moderate right pleural effusion with associated compressive atelectasis. Marked cardiomegaly. Calcific aortic and coronary atherosclerosis. Electronically Signed   By: Inge Rise M.D.   On: 09/13/2015 15:58   Dg Chest Port 1 View  09/21/2015  CLINICAL DATA:  Right side rib pain, shortness of Breath EXAM: PORTABLE CHEST 1 VIEW COMPARISON:  09/20/2015 FINDINGS: Cardiomegaly again noted. There is small right pleural effusion right basilar atelectasis or infiltrate. Right arm PICC line with tip in SVC. Left lung is clear. IMPRESSION: Small right pleural effusion with right basilar atelectasis or infiltrate. Right arm PICC line in place. Electronically Signed   By: Lahoma Crocker M.D.   On: 09/21/2015 10:17   Dg Chest Port 1 View  09/20/2015  CLINICAL DATA:  Pleural effusion, post thoracentesis EXAM: PORTABLE CHEST 1 VIEW COMPARISON:  09/19/2015 FINDINGS: Cardiomegaly again noted. Small residual right pleural effusion with right basilar atelectasis. Right arm PICC line with tip in SVC. There is no pneumothorax. No pulmonary edema. IMPRESSION: Small residual right pleural effusion right basilar atelectasis. No pneumothorax. No pulmonary edema. Stable right arm PICC line position. Electronically Signed   By: Lahoma Crocker M.D.   On: 09/20/2015 15:09   Dg Chest Port 1 View  09/19/2015  CLINICAL DATA:  Pleural effusion, preop for catheterization EXAM: PORTABLE CHEST 1 VIEW COMPARISON:  09/08/2015 FINDINGS: Moderately enlarged cardiac silhouette. Moderate vascular congestion with mild interstitial prominence. Small right pleural effusion with underlying consolidation similar to prior study. Right PICC line identified with tip just above the cavoatrial junction. No pneumothorax. IMPRESSION: Cardiac enlargement suggesting cardiomegaly as seen on 09/13/2015 CT scan. Vascular congestion with mild interstitial prominence suggests  congestive heart failure with mild pulmonary edema. Stable small right effusion with underlying consolidation pair Electronically Signed   By: Skipper Cliche M.D.   On: 09/19/2015 07:15   Dg Abd Portable 1v  09/21/2015  CLINICAL DATA:  Right lower quadrant pain EXAM: PORTABLE ABDOMEN - 1 VIEW COMPARISON:  CT scan 01/29/2014 FINDINGS: There is normal small bowel gas pattern. Moderate stool noted in right colon and hepatic flexure of the  colon. Moderate stool noted in descending colon and rectosigmoid colon. Partially visualized left hip prosthesis. Degenerative changes lumbar spine. Mild levoscoliosis lumbar spine. Small right pleural effusion with right basilar atelectasis or infiltrate. IMPRESSION: Normal small bowel gas pattern. Moderate colonic stool as described above. Mild levoscoliosis and degenerative changes lumbar spine. Electronically Signed   By: Lahoma Crocker M.D.   On: 09/21/2015 10:19    Microbiology: Recent Results (from the past 240 hour(s))  Body fluid culture     Status: None   Collection Time: 09/20/15  3:42 PM  Result Value Ref Range Status   Specimen Description PLEURAL RIGHT FLUID  Final   Special Requests Normal  Final   Gram Stain   Final    MODERATE WBC PRESENT,BOTH PMN AND MONONUCLEAR NO ORGANISMS SEEN    Culture NO GROWTH 3 DAYS  Final   Report Status 09/24/2015 FINAL  Final     Labs: Basic Metabolic Panel:  Recent Labs Lab 09/24/15 0400  09/25/15 0357 09/26/15 0610 09/26/15 1657 09/27/15 0216 09/28/15 0500 09/28/15 0732  NA 132*  < > 134* 136 130* 131* 134*  --   K 2.8*  < > 3.1* 3.2* 3.7 3.5 3.7  --   CL 75*  < > 80* 86* 87* 89* 93*  --   CO2 45*  < > 41* 37* 32 30 32  --   GLUCOSE 127*  < > 123* 133* 163* 121* 142*  --   BUN 65*  < > 62* 49* 44* 39* 36*  --   CREATININE 1.39*  < > 1.29* 1.19* 1.41* 1.09* 1.07*  --   CALCIUM 9.5  < > 9.7 9.8 9.4 9.4 9.3  --   MG 1.7  --  1.8 1.6* 3.1*  --   --  1.8  PHOS 3.9  --   --   --   --   --   --   --   < >  = values in this interval not displayed. Liver Function Tests:  Recent Labs Lab 09/22/15 0405 09/24/15 0400 09/26/15 0610  AST 42*  --  23  ALT 29  --  17  ALKPHOS 68  --  63  BILITOT 1.3*  --  0.9  PROT 8.0  --  7.6  ALBUMIN 3.9 3.6 3.6   No results for input(s): LIPASE, AMYLASE in the last 168 hours. No results for input(s): AMMONIA in the last 168 hours. CBC:  Recent Labs Lab 09/22/15 0405 09/23/15 0432 09/24/15 0400 09/25/15 0357 09/26/15 0610 09/27/15 0216  WBC 7.9 8.3 8.1 9.1 9.1 8.8  NEUTROABS 7.1  --  5.5  --  5.9  --   HGB 9.9* 10.1* 10.2* 11.4* 11.9* 11.5*  HCT 37.4 37.2 38.0 40.6 43.4 40.9  MCV 77.8* 77.5* 77.6* 78.2 77.8* 76.0*  PLT 241 254 302 305 295 313   Cardiac Enzymes: No results for input(s): CKTOTAL, CKMB, CKMBINDEX, TROPONINI in the last 168 hours. BNP: BNP (last 3 results)  Recent Labs  11/18/14 1015 09/07/15 2000  BNP 176.5* 848.6*    ProBNP (last 3 results) No results for input(s): PROBNP in the last 8760 hours.  CBG:  Recent Labs Lab 09/27/15 0633 09/27/15 1116 09/27/15 1620 09/27/15 2041 09/28/15 0545  GLUCAP 143* 199* 159* 183* 139*       Signed:  Kayti Poss [employer]  [facility] 09/28/2015, 10:16 AM

## 2015-09-28 NOTE — Progress Notes (Signed)
Patient ID: Jillian Bright, female   DOB: 1946/03/20, 70 y.o.   MRN: [id number]   ADVANCED HF ROUNDING NOTE    Subjective:   70 year old female history of coronary artery disease, congestive heart failure, diabetes mellitus, oxygen dependent COPD, obstructive sleep apnea-intolerant to CPAP, pulmonary hypertension, obesity, hyperlipidemia. She was last seen by Dr. Aundra Dubin in August 2013. She initially presented with peripheral edema in 2009 and had a normal stress test and preserved EF on echocardiogram. She had a right heart catheterization which showed mild/moderate pulmonary hypertension which responded well to oxygen with decreased PA pressure. She had 2-D echocardiogram on 09/08/2015 revealing normal ejection fraction of 60-65% and wall motion. Grade 1 diastolic dysfunction. Moderate to severe aortic stenosis where previously was moderate. Mild MR. Left atrium mildly dilated. Peak PA pressure 61 mmHg. Right atrium moderately to severely dilated. Moderate tricuspid valve regurgitation.  Thoracentesis 4/25 with 600 cc bloody fluid out, cytology negative for malignant cells.   TEE (09/21/15) with normal LV size with D-shaped septum suggesting RV pressure/volume overload. LV EF 45-50%, diffuse hypokinesis. Severely dilated RV with mild to moderate systolic dysfunction. Mild left atrial enlargement, no LA appendage thrombus. Moderate to severe right atrial enlargement. There was moderate to severe tricuspid regurgitation. Mild to moderate mitral regurgitation. The aortic valve was trileaflet with severe calcification. There was moderate to severe aortic stenosis with mean gradient 37 mmHg and AVA 1.0 cm^2.   09/13/15 CTA: negative for PE, enlarging lung nodule noted.  Feels good today. Denies CP, SOB, lightheadedness, or dizziness. Wants to be sure she's ready before leaving the hospital. Plans on going to [facility].   Overall diuresed 41 pounds (28 L). Creatinine improved from 1.4 ->  1.09 with holding of diuretics yesterday.    Objective:   Weight Range:  Vital Signs:   Temp:  [97.2 F (36.2 C)-98.6 F (37 C)] 98.6 F (37 C) (05/03 0518) Pulse Rate:  [92-95] 94 (05/03 0518) Resp:  [16-18] 17 (05/03 0518) BP: (101-118)/(63-72) 116/70 mmHg (05/03 0518) SpO2:  [96 %-100 %] 96 % (05/03 0840) Weight:  [165 lb 9.6 oz (75.116 kg)] 165 lb 9.6 oz (75.116 kg) (05/03 0518) Last BM Date: 09/27/15  Weight change: Filed Weights   09/26/15 0400 09/27/15 0642 09/28/15 0518  Weight: 169 lb 5 oz (76.8 kg) 165 lb 3.2 oz (74.934 kg) 165 lb 9.6 oz (75.116 kg)    Intake/Output:   Intake/Output Summary (Last 24 hours) at 09/28/15 0913 Last data filed at 09/28/15 0542  Gross per 24 hour  Intake   1060 ml  Output    280 ml  Net    780 ml     Physical Exam: General: NAD. No resp difficulty. Lying flat in  bed.  HEENT: normal Neck: supple. JVP flat Carotids 2+ bilat; no bruits. No thyromegaly or nodule noted. Cor: PMI nondisplaced. RRR. No rubs or gallops.   3/6 crescendo-decrescendo murmur RUSB with muffled S2. Lungs: RML RLL mildly decreased. On 4 liters oxygen Abdomen: soft, NT, ND, no HSM. No bruits or masses. +BS  Extremities: no cyanosis, clubbing, rash. No edema.  Neuro: confused , cranial nerves grossly intact. moves all 4 extremities w/o difficulty. Affect pleasant GU: Foley   Telemetry: Reviewed telemetry, NSR 80-90s  Labs: Basic Metabolic Panel:  Recent Labs Lab 09/24/15 0400  09/25/15 0357 09/26/15 0610 09/26/15 1657 09/27/15 0216 09/28/15 0500 09/28/15 0732  NA 132*  < > 134* 136 130* 131* 134*  --   K 2.8*  < >  3.1* 3.2* 3.7 3.5 3.7  --   CL 75*  < > 80* 86* 87* 89* 93*  --   CO2 45*  < > 41* 37* 32 30 32  --   GLUCOSE 127*  < > 123* 133* 163* 121* 142*  --   BUN 65*  < > 62* 49* 44* 39* 36*  --   CREATININE 1.39*  < > 1.29* 1.19* 1.41* 1.09* 1.07*  --   CALCIUM 9.5  < > 9.7 9.8 9.4 9.4 9.3  --   MG 1.7  --  1.8 1.6* 3.1*  --   --  1.8  PHOS  3.9  --   --   --   --   --   --   --   < > = values in this interval not displayed.  Liver Function Tests:  Recent Labs Lab 09/22/15 0405 09/24/15 0400 09/26/15 0610  AST 42*  --  23  ALT 29  --  17  ALKPHOS 68  --  63  BILITOT 1.3*  --  0.9  PROT 8.0  --  7.6  ALBUMIN 3.9 3.6 3.6   No results for input(s): LIPASE, AMYLASE in the last 168 hours. No results for input(s): AMMONIA in the last 168 hours.  CBC:  Recent Labs Lab 09/22/15 0405 09/23/15 0432 09/24/15 0400 09/25/15 0357 09/26/15 0610 09/27/15 0216  WBC 7.9 8.3 8.1 9.1 9.1 8.8  NEUTROABS 7.1  --  5.5  --  5.9  --   HGB 9.9* 10.1* 10.2* 11.4* 11.9* 11.5*  HCT 37.4 37.2 38.0 40.6 43.4 40.9  MCV 77.8* 77.5* 77.6* 78.2 77.8* 76.0*  PLT 241 254 302 305 295 313    Cardiac Enzymes: No results for input(s): CKTOTAL, CKMB, CKMBINDEX, TROPONINI in the last 168 hours.  BNP: BNP (last 3 results)  Recent Labs  11/18/14 1015 09/07/15 2000  BNP 176.5* 848.6*    ProBNP (last 3 results) No results for input(s): PROBNP in the last 8760 hours.    Other results:  Imaging: No results found.   Medications:     Scheduled Medications: . antiseptic oral rinse  7 mL Mouth Rinse BID  . aspirin  81 mg Oral Daily  . atorvastatin  20 mg Oral Daily  . darifenacin  7.5 mg Oral Daily  . DULoxetine  60 mg Oral Daily  . ferrous sulfate  325 mg Oral BID WC  . gabapentin  100 mg Oral TID  . guaiFENesin  600 mg Oral BID  . heparin subcutaneous  5,000 Units Subcutaneous Q8H  . insulin aspart  0-15 Units Subcutaneous TID WC  . insulin aspart  0-5 Units Subcutaneous QHS  . insulin aspart  3 Units Subcutaneous TID WC  . insulin glargine  15 Units Subcutaneous QHS  . mometasone-formoterol  2 puff Inhalation BID  . pantoprazole  40 mg Oral Daily  . polyethylene glycol  17 g Oral Daily  . potassium chloride  20 mEq Oral BID  . senna-docusate  1 tablet Oral QHS  . sodium chloride flush  3 mL Intravenous Q12H  .  spironolactone  25 mg Oral Daily  . torsemide  40 mg Oral Daily  . vitamin B-12  1,000 mcg Oral Daily    Infusions:    PRN Medications: sodium chloride, acetaminophen, ALPRAZolam, ipratropium-albuterol, ondansetron (ZOFRAN) IV, sodium chloride flush, sodium chloride flush, traMADol   Assessment/Plan/Discussion     1. Acute on chronic diastolic CHF: With prominent RV failure in the  setting of OHS/OSA. TEE 4/27 with EF 45-50%, D-shaped interventricular septum, severely dilated RV with mild to moderate systolic dysfunction, moderate to severe AS.  She was started on dobutamine for RV support.  Off dobutamine Friday 4/28. Volume status stable. - She did not have diuretics (other than spiro) PTA.  - Volume status ok. Continue torsemide 40 mg daily.  2. Aortic stenosis: TEE done, AS is moderate to severe by numbers with mean gradient 37 and AVA 1.0 cm^2 => moderate to severe stenosis (not critical).  Dr. Aundra Dubin reviewed her TEE with Dr Burt Knack.  While we suspect that the valve is hemodynamically significant, her main problem here seems to be severe right heart failure.  She also has moderate to probably severe TR.  The right heart problems will likely not go away with TAVR, and we would be exposing her to a high risk procedure (may not be able to come off vent). For now, continue medical management of her RV failure. Now that she seems more compensated, will review TAVR again with Dr Roxy Manns.  - Discussed with Dr Roxy Manns. Her aortic valve shows borderline severe stenosis, not critical.  Her main problem is RV failure that will not be significantly improved by TAVR, and TAVR would expose her to significant risks. Plan to continue medical treatment for now.  No cath.   3. Pulmonary hypertension: She has a history of pulmonary hypertension thought to be due to OHS/OSA. Suspect her [place] currently is a combination of OHS/OSA and pulmonary venous hypertension from volume overload but now euvolemic.   4. OHS/OSA:  She is on high dose home O2 chronically. Had not been able to tolerate CPAP in the past but doing ok with it here.  Will have her wear CPAP at night.  5. COPD: On home O2, no longer smokes. She is now back on her chronic home oxygen (4-5 L by ).  6. CAD: h/o RCA PCI. Stable.  Continue ASA 81 and statin.  7. Tricuspid regurgitation: Moderate to probably severe in setting of severe RV dilation.  8. Pulmonary nodule: Enlarging pulmonary nodule concerning for bronchogenic carcinoma, especially with smoking history. This will have to be addressed after her heart issues are stabilized. Will need PET scan after cardiac issues stabilized (must be done as outpatient).  - Thoracentesis on 4/25, bloody fluid sent for cytology, fluid negative for malignant cells. - She will need outpatient workup of concerning lung mass => PET/CT and pulmonary followup.  9. AKI on CKD:  - Stable. Cr 1.07 today. (down from 1.4 at recheck yesterday) 10. Alkalosis: Primarily due to chronic CO2 retention +/- diuresis. Off acetazolamide now 11. Hypokalemia: Continue to supp. Continue spiro 25 mg daily. 12. Delirium: Confusion at times, worse at night. Improved  13. Hypomagnesium: Mag 1.8   13. Disposition: She is DNR/DNI.   - Can go to SNF today.   D/C HF meds Kdur 20 meq twice a day  Torsemide 40 mg daily Spiro 25 mg daily   Amy Clegg NP-C  09/28/2015 9:13 AM   Advanced Heart Failure Team Pager [phone] (M-F; 7a - 4p)  Please contact [facility] for night-coverage after hours (4p -7a ) and weekends on [email or web address]  Patient seen with NP, agree with the above note.  She is ready to go to SNF today.  Continue current med regimen, will need close followup with me.   Loralie Champagne 09/28/2015 12:17 PM

## 2015-09-28 NOTE — Progress Notes (Signed)
Orthopedic Tech Progress Note Patient Details:  Jillian Bright 13-Feb-1946 [id number]  Ortho Devices Type of Ortho Device: Louretta Parma boot Ortho Device/Splint Location: bilateral Ortho Device/Splint Interventions: Application   Hildred Priest 09/28/2015, 10:49 AM

## 2015-09-28 NOTE — Progress Notes (Addendum)
Report called to [place].

## 2015-09-28 NOTE — Progress Notes (Signed)
CARDIAC REHAB PHASE I   Discussed HF ed with pt including low sodium, daily wts, mobility. Voiced understanding. Gave her HF booklet. Pt appreciative of information and eager to get stronger.  [place], ACSM 09/28/2015 1:08 PM

## 2015-09-29 ENCOUNTER — Encounter: Payer: Self-pay | Admitting: Internal Medicine

## 2015-09-29 ENCOUNTER — Non-Acute Institutional Stay (SKILLED_NURSING_FACILITY): Payer: Medicare Other | Admitting: Internal Medicine

## 2015-09-29 DIAGNOSIS — J948 Other specified pleural conditions: Secondary | ICD-10-CM | POA: Diagnosis not present

## 2015-09-29 DIAGNOSIS — J441 Chronic obstructive pulmonary disease with (acute) exacerbation: Secondary | ICD-10-CM

## 2015-09-29 DIAGNOSIS — E538 Deficiency of other specified B group vitamins: Secondary | ICD-10-CM

## 2015-09-29 DIAGNOSIS — E873 Alkalosis: Secondary | ICD-10-CM | POA: Insufficient documentation

## 2015-09-29 DIAGNOSIS — G9340 Encephalopathy, unspecified: Secondary | ICD-10-CM

## 2015-09-29 DIAGNOSIS — J9621 Acute and chronic respiratory failure with hypoxia: Secondary | ICD-10-CM

## 2015-09-29 DIAGNOSIS — N179 Acute kidney failure, unspecified: Secondary | ICD-10-CM

## 2015-09-29 DIAGNOSIS — N182 Chronic kidney disease, stage 2 (mild): Secondary | ICD-10-CM

## 2015-09-29 DIAGNOSIS — E876 Hypokalemia: Secondary | ICD-10-CM | POA: Diagnosis not present

## 2015-09-29 DIAGNOSIS — J9622 Acute and chronic respiratory failure with hypercapnia: Secondary | ICD-10-CM

## 2015-09-29 DIAGNOSIS — E1122 Type 2 diabetes mellitus with diabetic chronic kidney disease: Secondary | ICD-10-CM

## 2015-09-29 DIAGNOSIS — D509 Iron deficiency anemia, unspecified: Secondary | ICD-10-CM | POA: Diagnosis not present

## 2015-09-29 DIAGNOSIS — I5033 Acute on chronic diastolic (congestive) heart failure: Secondary | ICD-10-CM | POA: Diagnosis not present

## 2015-09-29 DIAGNOSIS — I350 Nonrheumatic aortic (valve) stenosis: Secondary | ICD-10-CM

## 2015-09-29 DIAGNOSIS — R911 Solitary pulmonary nodule: Secondary | ICD-10-CM | POA: Diagnosis not present

## 2015-09-29 DIAGNOSIS — J90 Pleural effusion, not elsewhere classified: Secondary | ICD-10-CM

## 2015-09-29 NOTE — Progress Notes (Signed)
MRN: [id number] Name: Jillian Bright  Sex: female Age: 70 y.o. DOB: 09-Dec-1945  [place] #: Karren Burly Facility/Room:117-A Level Of Care: SNF Provider: Inocencio Homes MD Emergency Contacts: Extended Emergency Contact Information Primary Emergency Contact: Lilla Shook [place] Phone: [phone] Mobile Phone: [phone] Relation: Brother Secondary Emergency Contact: Milchuck,Dawn And Leamon Arnt, [place] Phone: [phone] Mobile Phone: [phone] Relation: Friend  Code Status:   Allergies: Codeine  Chief Complaint  Patient presents with  . New Admit To SNF    HPI: Patient is 70 y.o. female withcomplaint of worsening shortness of breath and orthopnea, was found to have acute on chronic diastolic CHF with significant volume overload. Pt was admitted to [facility] from 4/12-5/3 where pt had a prolonged course of diuresis, involving dobutamine drips on several occasons. Hospital course was further complicated by the finding of a L pulmonary nodule with pleural effusion which was tapped. Finally course was complicated by finding of mod  aortic stenosis and dema dex wa sstarted and finally pt stabalized. Pt is admitted to SNF with generalized weakness. While at SNF pt will be followed for anemia, tx with iron, DM2 , tx with insulin, glucophage, Januvia and glipizide, and B12 deficiency, tx with replacement.  Past Medical History  Diagnosis Date  . Diabetes mellitus   . COPD (chronic obstructive pulmonary disease) ([place])     sees Dr. [name]  . Sleep apnea, obstructive   . Pulmonary HTN ([place])   . Obesity (BMI 30.0-34.9)   . Hyperlipidemia   . Myocardial infarction ([place])   . Coronary artery disease   . Anxiety   . Shortness of breath   . GERD (gastroesophageal reflux disease)   . Arthritis     rheumatoid, sees Dr. Tobie Lords  . CHF (congestive heart failure) ([place])   . Pneumonia   . Bronchitis     hx of   .  History of hiatal hernia   . Acute on chronic diastolic (congestive) heart failure ([place]) 09/07/2015  . Aortic stenosis 01/14/2012  . Pleural effusion on right 09/07/2015  . Tricuspid regurgitation   . Right-sided congestive heart failure ([place])   . Mitral regurgitation     Past Surgical History  Procedure Laterality Date  . Appendectomy    . Tonsillectomy and adenoidectomy    . Lumbar disc surgery    . Hemorrhoid surgery    . Coronary angioplasty    . Endarterectomy Left 12/04/2012    Procedure: ENDARTERECTOMY CAROTID;  Surgeon: Mal Misty, MD;  Location: [place];  Service: Vascular;  Laterality: Left;  . Patch angioplasty Left 12/04/2012    Procedure: PATCH ANGIOPLASTY;  Surgeon: Mal Misty, MD;  Location: [place];  Service: Vascular;  Laterality: Left;  [facility] Eye surgery      bilat cataract surg   . Colonscopy     . Total hip arthroplasty Left 07/30/2014    Procedure: LEFT TOTAL HIP ARTHROPLASTY ANTERIOR APPROACH;  Surgeon: Meredith Pel, MD;  Location: [facility];  Service: Orthopedics;  Laterality: Left;  . Tee without cardioversion N/A 09/21/2015    Procedure: TRANSESOPHAGEAL ECHOCARDIOGRAM (TEE);  Surgeon: Larey Dresser, MD;  Location: [facility];  Service: Cardiovascular;  Laterality: N/A;      Medication List       This list is accurate as of: 09/29/15  8:34 PM.  Always use your most recent med list.  albuterol 108 (90 Base) MCG/ACT inhaler  Commonly known as:  PROVENTIL HFA;VENTOLIN HFA  Inhale 2 puffs into the lungs every 6 (six) hours as needed for wheezing or shortness of breath.     aspirin 81 MG chewable tablet  Chew 1 tablet (81 mg total) by mouth daily.     atorvastatin 20 MG tablet  Commonly known as:  LIPITOR  Take 1 tablet (20 mg total) by mouth daily.     budesonide-formoterol 160-4.5 MCG/ACT inhaler  Commonly known as:  SYMBICORT  Inhale 2 puffs into the lungs 2 (two) times daily.     DULoxetine 60 MG capsule  Commonly known as:   CYMBALTA  Take 1 capsule (60 mg total) by mouth daily.     ferrous sulfate 325 (65 FE) MG tablet  Take 1 tablet (325 mg total) by mouth 2 (two) times daily with a meal.     gabapentin 100 MG capsule  Commonly known as:  NEURONTIN  Take 1 capsule (100 mg total) by mouth 3 (three) times daily.     glipiZIDE 10 MG tablet  Commonly known as:  GLUCOTROL  TAKE 1 TABLET (10 MG TOTAL) BY MOUTH 2 (TWO) TIMES DAILY BEFORE A MEAL.     metFORMIN 500 MG tablet  Commonly known as:  GLUCOPHAGE  Take 1 tablet (500 mg total) by mouth 3 (three) times daily.     omeprazole 40 MG capsule  Commonly known as:  PRILOSEC  Take 1 capsule (40 mg total) by mouth daily before breakfast.     potassium chloride SA 20 MEQ tablet  Commonly known as:  K-DUR,KLOR-CON  Take 1 tablet (20 mEq total) by mouth daily.     sitaGLIPtin 100 MG tablet  Commonly known as:  JANUVIA  Take 1 tablet (100 mg total) by mouth daily.     solifenacin 10 MG tablet  Commonly known as:  VESICARE  Take 1 tablet (10 mg total) by mouth daily.     spironolactone 25 MG tablet  Commonly known as:  ALDACTONE  Take 1 tablet (25 mg total) by mouth every morning.     torsemide 20 MG tablet  Commonly known as:  DEMADEX  Take 2 tablets (40 mg total) by mouth daily.     traMADol 50 MG tablet  Commonly known as:  ULTRAM  Take 1 tablet (50 mg total) by mouth every 12 (twelve) hours as needed for severe pain.     vitamin B-12 1000 MCG tablet  Commonly known as:  CYANOCOBALAMIN  Take 1 tablet (1,000 mcg total) by mouth daily.        No orders of the defined types were placed in this encounter.    Immunization History  Administered Date(s) Administered  . Influenza Split 04/03/2011, 02/21/2012, 05/25/2013  . Influenza Whole 04/10/2010  . Influenza,inj,Quad PF,36+ Mos 02/23/2014, 03/08/2015  . Pneumococcal Polysaccharide-23 04/10/2010    Social History  Substance Use Topics  . Smoking status: Former Smoker -- 2.00 packs/day  for 36 years    Types: Cigarettes    Quit date: 02/02/2013  . Smokeless tobacco: Never Used     Comment: had restarted and quit again 01/27/12   . Alcohol Use: 0.0 oz/week    0 Standard drinks or equivalent per week     Comment: rare    Family history is  + HTN, HLD  Review of Systems  DATA OBTAINED: from patient; no c/o , anxious to get started on therapy GENERAL:  no fevers, fatigue,  appetite changes SKIN: No itching, rash or wounds EYES: No eye pain, redness, discharge EARS: No earache, tinnitus, change in hearing NOSE: No congestion, drainage or bleeding  MOUTH/THROAT: No mouth or tooth pain, No sore throat RESPIRATORY: No cough, wheezing, SOB CARDIAC: No chest pain, palpitations, lower extremity edema  GI: No abdominal pain, No N/V/D or constipation, No heartburn or reflux  GU: No dysuria, frequency or urgency, or incontinence  MUSCULOSKELETAL: No unrelieved bone/joint pain NEUROLOGIC: No headache, dizziness or focal weakness PSYCHIATRIC: No c/o anxiety or sadness   Filed Vitals:   09/29/15 1301  BP: 111/72  Pulse: 95  Temp: 97.3 F (36.3 C)  Resp: 24    SpO2 Readings from Last 1 Encounters:  09/29/15 95%        Physical Exam  GENERAL APPEARANCE: Alert, conversant,  No acute distress.  SKIN: No diaphoresis rash HEAD: Normocephalic, atraumatic  EYES: Conjunctiva/lids clear. Pupils round, reactive. EOMs intact.  EARS: External exam WNL, canals clear. Hearing grossly normal.  NOSE: No deformity or discharge.  MOUTH/THROAT: Lips w/o lesions  RESPIRATORY: Breathing is even, unlabored. Lung sounds are clear   CARDIOVASCULAR: Heart RRR no murmurs, rubs or gallops. No peripheral edema;B legs are wrapped  GASTROINTESTINAL: Abdomen is soft, non-tender, not distended w/ normal bowel sounds. GENITOURINARY: Bladder non tender, not distended  MUSCULOSKELETAL: No abnormal joints or musculature NEUROLOGIC:  Cranial nerves 2-12 grossly intact. Moves all extremities   PSYCHIATRIC: Mood and affect appropriate to situation, no behavioral issues  Patient Active Problem List   Diagnosis Date Noted  . Hypokalemia 09/29/2015  . Hypomagnesemia 09/29/2015  . Iron deficiency anemia 09/29/2015  . Metabolic alkalosis 50/93/2671  . Type II diabetes mellitus with renal manifestations ([place]) 09/28/2015  . Aortic stenosis, severe 09/28/2015  . Solitary pulmonary nodule   . COPD exacerbation ([place])   . Hypoxemia   . Vitamin B12 deficiency 09/23/2015  . Acute encephalopathy 09/22/2015  . Acute-on-chronic respiratory failure ([place]) 09/22/2015  . Chronic obstructive pulmonary disease ([place])   . Lung nodule   . OSA (obstructive sleep apnea)   . Pleural effusion   . Acute hypoxemic respiratory failure ([place])   . Morbid obesity ([place])   . Tricuspid regurgitation   . Right-sided congestive heart failure ([place])   . Mitral regurgitation   . Abnormal chest x-ray   . Acute on chronic congestive heart failure ([place])   . Acute on chronic diastolic (congestive) heart failure ([place]) 09/07/2015  . On home oxygen therapy 09/07/2015  . Microcytic anemia 09/07/2015  . CHF (congestive heart failure) ([place]) 09/07/2015  . CHF exacerbation ([place]) 09/07/2015  . Pleural effusion on right 09/07/2015  . Pressure ulcer 11/18/2014  . DM type 2 (diabetes mellitus, type 2) ([place]) 11/18/2014  . Syncope 11/17/2014  . Acute on chronic respiratory failure with hypercapnia ([place]) 11/17/2014  . 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 Weakness 01/29/2014  . Abdominal pain 01/29/2014  . Carotid stenosis 06/23/2013  . Rheumatoid arthritis ([place]) 06/08/2013  . Fibromyalgia 06/08/2013  . Occlusion and stenosis of carotid artery without mention of cerebral infarction 12/02/2012  . AKI (acute kidney injury) ([place]) 02/01/2012  . Aortic stenosis 01/14/2012  . CAD (coronary artery disease) 11/07/2011  . Obesity hypoventilation syndrome ([place]) 06/10/2009  . COPD with emphysema  ([place]) 05/30/2009  . Pulmonary HTN ([place]) 05/25/2009  . ABDOMINAL PAIN, LOWER 08/04/2008  . DIABETES MELLITUS, TYPE II 08/18/2007  . Chronic diastolic heart failure ([place]) 08/18/2007  . Other  and unspecified hyperlipidemia 08/04/2007  . Generalized anxiety disorder 08/04/2007  . Essential hypertension 08/04/2007  . GERD 08/04/2007  . DEGENERATIVE JOINT DISEASE 08/04/2007  . DEPENDENT EDEMA 08/04/2007    CBC    Component Value Date/Time   WBC 8.8 09/27/2015 0216   RBC 5.38* 09/27/2015 0216   RBC 4.02 09/08/2015 0713   HGB 11.5* 09/27/2015 0216   HCT 40.9 09/27/2015 0216   PLT 313 09/27/2015 0216   MCV 76.0* 09/27/2015 0216   LYMPHSABS 1.8 09/26/2015 0610   MONOABS 1.0 09/26/2015 0610   EOSABS 0.3 09/26/2015 0610   BASOSABS 0.0 09/26/2015 0610    CMP     Component Value Date/Time   NA 134* 09/28/2015 0500   K 3.7 09/28/2015 0500   CL 93* 09/28/2015 0500   CO2 32 09/28/2015 0500   GLUCOSE 142* 09/28/2015 0500   BUN 36* 09/28/2015 0500   CREATININE 1.07* 09/28/2015 0500   CALCIUM 9.3 09/28/2015 0500   PROT 7.6 09/26/2015 0610   ALBUMIN 3.6 09/26/2015 0610   AST 23 09/26/2015 0610   ALT 17 09/26/2015 0610   ALKPHOS 63 09/26/2015 0610   BILITOT 0.9 09/26/2015 0610   GFRNONAA 51* 09/28/2015 0500   GFRAA 59* 09/28/2015 0500    Lab Results  Component Value Date   HGBA1C 7.3* 09/08/2015     Dg Chest 2 View  09/08/2015  CLINICAL DATA:  Followup for abnormal chest radiograph. Patient with leg swelling and shortness of breath since yesterday. History of COPD. EXAM: CHEST  2 VIEW COMPARISON:  09/07/2015 FINDINGS: Since the previous day's exam, there has been no significant change. Cardiac silhouette is mildly enlarged. There is right lung base opacity obscuring the right heart border right hemidiaphragm consistent with a combination of a moderate pleural effusion with either atelectasis or infiltrate. Lungs are hyperexpanded. There are irregularly thickened interstitial  markings which are similar to the prior study. Interstitial markings are mildly increased when compared to a study dated 07/21/2014. IMPRESSION: 1. No change from the previous day's study. 2. Right basilar opacity reflecting a moderate pleural effusion with either atelectasis, pneumonia or a combination. 3. Mild cardiomegaly. Irregular interstitial thickening. A component of congestive heart failure should be considered. Electronically Signed   By: Lajean Manes M.D.   On: 09/08/2015 11:31   Dg Chest 2 View  09/07/2015  CLINICAL DATA:  Shortness of breath with bilateral leg swelling. EXAM: CHEST  2 VIEW COMPARISON:  11/17/2014 FINDINGS: There is cardiomegaly. Small to moderate right pleural effusion with right lower lobe atelectasis. No confluent opacity or effusion on the left. No overt edema. No acute bony abnormality. IMPRESSION: Cardiomegaly. Right lower lobe atelectasis or infiltrate with small to moderate right pleural effusion. Electronically Signed   By: Rolm Baptise M.D.   On: 09/07/2015 21:42    Not all labs, radiology exams or other studies done during hospitalization come through on my EPIC note; however they are reviewed by me.    Assessment and Plan  Acute on chronic diastolic (congestive) heart failure (HCC) Probably worsening due to noncompliance as an outpatient Cardiology as well as pulmonary has been consulted on the patient. Patient status post right pleural effusion thoracentesis. Initially was on dopamine drip, later on dobutamine drip as well as Lasix infusion, now discontinued since 09/23/2015 No foley, but weights have been improving. Renal function stable. Plan to d/c today on Torsemide 40 mg PO daily ;also started on Aldactone. SNF - cont demadex 40 mg daily and  25 mg  daily   COPD with chronic respiratory failure. Acute on chronic hypoxic respiratory failure, home oxygen 3 L  Right-sided pleural effusion. Exudative Left upper pulmonary nodule. Patient underwent  thoracentesis of the right pleural effusion.  Negative culture as well as cytology of the pleural fluid. Repeat CT scan shows evidence of atelectasis versus possible infiltrate although the patient is not exhibiting any signs of infection. ABG shows mixed respiratory acidosis and metabolic alkalosis, BiPAP when necessary ordered, but never needed. Completed prednisone for 3 days. Repeat chest x-ray on 09/25/2015 does not show any reaccumulation of the fluid Currently requiring 4 L of nasal cannula to maintain saturation more than 94%. Continue incentive spirometry.Flutter device for pulmonary toilet SNF -  outpatient follow-up as well as outpatient PET scan.  Aortic stenosis. Moderate to severe based on TEE. Cardiology feels that the patient would not be an ideal candidate for TAVR, since she has significant right-sided heart failure with severe TR and would not add any benefit from the surgery symptomatically. Appreciate input from cardiac thoracic surgery who would like to medically manage the patient. Also patient's left pulmonary nodule requiring further workup and possibility of bronchogenic carcinoma would also limit feasibility of any surgical or interventional procedure for aortic stenosis. SNF - outpatient follow-up with cardiac thoracic surgery after PET scan.  Acute on chronic kidney injury. Chronic kidney disease stage II. Renal function mildly worsened during the hospitalization but improved after initiation of the dobutamine drip. Likely cardiorenal hemodynamics. Renal function continues to improve despite stopping dobutamine drip.  Continue to monitor. SNF - will f/u  BMP  Hypokalemia. Replacing. Scheduled potassium 20 mg twice a day Hypomagnesemia. Resolved  Microcytic anemia. Likely associated with iron deficiency. We will initiate iron supplementation. Further GI evaluation as an outpatient. Status post PRBC on April 16. SNF - will f/u CBC  Type 2 diabetes  mellitus. Hemoglobin A1c 7.3 on 09/08/2015. We will increase the sliding scale to moderate. With fasting blood sugar 168 increase Lantus to 15 units Add 3 units of pre-meal coverage SNF - CBG ac and continue increased insulin regimen and back on home meds of glucophage, januvia and glipizide.  Acute encephalopathy Likely from fentanyl patch gradually improving. Also gradually tapering off the gabapentin from 300 mg 3 times a day to 100 mg 3 times a day. SNF - cont neurontin 921 mg TID  METABOLIC ALKALOSIS Likely from aggressive diuresis. Almost resolved Also has chronic respiratory compensatory alkalosis at her baseline due to her COPD. Diamox was initially used but since now metabolic alkalosis has resolved it is discontinued  vitamin B 12 deficiency. B-12 414,  Low normal side. snf - WILL CONT SUPPLEMENTATION   Time spent > 45 min;> 50% of time with patient was spent reviewing records, labs, tests and studies, counseling and developing plan of care  Inocencio Homes  MD

## 2015-09-29 NOTE — Assessment & Plan Note (Signed)
Probably worsening due to noncompliance as an outpatient Cardiology as well as pulmonary has been consulted on the patient. Patient status post right pleural effusion thoracentesis. Initially was on dopamine drip, later on dobutamine drip as well as Lasix infusion, now discontinued since 09/23/2015 No foley, but weights have been improving. Renal function stable. Plan to d/c today on Torsemide 40 mg PO daily ;also started on Aldactone. SNF - cont demadex 40 mg daily and  25 mg daily

## 2015-09-29 NOTE — Progress Notes (Signed)
This encounter was created in error - please disregard.

## 2015-10-02 ENCOUNTER — Encounter (HOSPITAL_COMMUNITY): Payer: Self-pay | Admitting: *Deleted

## 2015-10-02 ENCOUNTER — Emergency Department (HOSPITAL_COMMUNITY)
Admission: EM | Admit: 2015-10-02 | Discharge: 2015-10-02 | Disposition: A | Discharge status: Home / Self Care | Payer: Medicare Other | Attending: Emergency Medicine | Admitting: Emergency Medicine

## 2015-10-02 ENCOUNTER — Emergency Department (HOSPITAL_COMMUNITY): Payer: Medicare Other

## 2015-10-02 DIAGNOSIS — Z87891 Personal history of nicotine dependence: Secondary | ICD-10-CM | POA: Insufficient documentation

## 2015-10-02 DIAGNOSIS — E119 Type 2 diabetes mellitus without complications: Secondary | ICD-10-CM | POA: Diagnosis not present

## 2015-10-02 DIAGNOSIS — I5033 Acute on chronic diastolic (congestive) heart failure: Secondary | ICD-10-CM | POA: Insufficient documentation

## 2015-10-02 DIAGNOSIS — Z7982 Long term (current) use of aspirin: Secondary | ICD-10-CM | POA: Insufficient documentation

## 2015-10-02 DIAGNOSIS — G5630 Lesion of radial nerve, unspecified upper limb: Secondary | ICD-10-CM | POA: Insufficient documentation

## 2015-10-02 DIAGNOSIS — E669 Obesity, unspecified: Secondary | ICD-10-CM | POA: Insufficient documentation

## 2015-10-02 DIAGNOSIS — M79641 Pain in right hand: Secondary | ICD-10-CM | POA: Diagnosis present

## 2015-10-02 DIAGNOSIS — Z8701 Personal history of pneumonia (recurrent): Secondary | ICD-10-CM | POA: Diagnosis not present

## 2015-10-02 DIAGNOSIS — G5631 Lesion of radial nerve, right upper limb: Secondary | ICD-10-CM

## 2015-10-02 DIAGNOSIS — Z7984 Long term (current) use of oral hypoglycemic drugs: Secondary | ICD-10-CM | POA: Insufficient documentation

## 2015-10-02 DIAGNOSIS — I2510 Atherosclerotic heart disease of native coronary artery without angina pectoris: Secondary | ICD-10-CM | POA: Insufficient documentation

## 2015-10-02 DIAGNOSIS — J449 Chronic obstructive pulmonary disease, unspecified: Secondary | ICD-10-CM | POA: Insufficient documentation

## 2015-10-02 DIAGNOSIS — I252 Old myocardial infarction: Secondary | ICD-10-CM | POA: Insufficient documentation

## 2015-10-02 DIAGNOSIS — Z79899 Other long term (current) drug therapy: Secondary | ICD-10-CM | POA: Insufficient documentation

## 2015-10-02 LAB — CBC WITH DIFFERENTIAL/PLATELET
BASOS ABS: 0 10*3/uL (ref 0.0–0.1)
BASOS PCT: 0 %
EOS ABS: 0 10*3/uL (ref 0.0–0.7)
EOS PCT: 0 %
HCT: 40.5 % (ref 36.0–46.0)
Hemoglobin: 11.4 g/dL — ABNORMAL LOW (ref 12.0–15.0)
Lymphocytes Relative: 29 %
Lymphs Abs: 2.9 10*3/uL (ref 0.7–4.0)
MCH: 22.2 pg — ABNORMAL LOW (ref 26.0–34.0)
MCHC: 28.1 g/dL — ABNORMAL LOW (ref 30.0–36.0)
MCV: 78.8 fL (ref 78.0–100.0)
MONO ABS: 0.7 10*3/uL (ref 0.1–1.0)
Monocytes Relative: 7 %
NEUTROS ABS: 6.4 10*3/uL (ref 1.7–7.7)
Neutrophils Relative %: 64 %
PLATELETS: 222 10*3/uL (ref 150–400)
RBC: 5.14 MIL/uL — ABNORMAL HIGH (ref 3.87–5.11)
RDW: 21.4 % — AB (ref 11.5–15.5)
WBC: 10 10*3/uL (ref 4.0–10.5)

## 2015-10-02 LAB — BASIC METABOLIC PANEL
ANION GAP: 16 — AB (ref 5–15)
BUN: 34 mg/dL — AB (ref 6–20)
CO2: 29 mmol/L (ref 22–32)
CREATININE: 1.3 mg/dL — AB (ref 0.44–1.00)
Calcium: 9.3 mg/dL (ref 8.9–10.3)
Chloride: 92 mmol/L — ABNORMAL LOW (ref 101–111)
GFR calc Af Amer: 47 mL/min — ABNORMAL LOW (ref >60)
GFR, EST NON AFRICAN AMERICAN: 40 mL/min — AB (ref >60)
GLUCOSE: 65 mg/dL (ref 65–99)
Potassium: 4.3 mmol/L (ref 3.5–5.1)
Sodium: 137 mmol/L (ref 135–145)

## 2015-10-02 NOTE — ED Provider Notes (Signed)
CSN: [id number]     Arrival date & time 10/02/15  1140 History   First MD Initiated Contact with Patient 10/02/15 1211     Chief Complaint  Patient presents with  . Hand Problem     (Consider location/radiation/quality/duration/timing/severity/associated sxs/prior Treatment) HPI  70 year old female with a history of COPD, diabetes, and CHF presents with a right wrist drop. She states at 2:30 in the morning she was reading because she cannot fall asleep. She thinks she then fell asleep and woke up later with her right arm feeling like it was asleep. The patient states that that numbness and tingling one away but now she cannot extend her wrist. The rest of her arm strength feels normal. There is no leg weakness. No headache or neck pain. Later in the day a picture frame fell and landed on her right forearm. Mild tenderness intermittent forearm but otherwise no significant pain. This injury occurred after her weakness and numbness in her hand.  Past Medical History  Diagnosis Date  . Diabetes mellitus   . COPD (chronic obstructive pulmonary disease) ([place])     sees Dr. [name]  . Sleep apnea, obstructive   . Pulmonary HTN ([place])   . Obesity (BMI 30.0-34.9)   . Hyperlipidemia   . Myocardial infarction ([place])   . Coronary artery disease   . Anxiety   . Shortness of breath   . GERD (gastroesophageal reflux disease)   . Arthritis     rheumatoid, sees Dr. Tobie Lords  . CHF (congestive heart failure) ([place])   . Pneumonia   . Bronchitis     hx of   . History of hiatal hernia   . Acute on chronic diastolic (congestive) heart failure ([place]) 09/07/2015  . Aortic stenosis 01/14/2012  . Pleural effusion on right 09/07/2015  . Tricuspid regurgitation   . Right-sided congestive heart failure ([place])   . Mitral regurgitation    Past Surgical History  Procedure Laterality Date  . Appendectomy    . Tonsillectomy and adenoidectomy    . Lumbar disc surgery    . Hemorrhoid surgery    . Coronary  angioplasty    . Endarterectomy Left 12/04/2012    Procedure: ENDARTERECTOMY CAROTID;  Surgeon: Mal Misty, MD;  Location: [facility];  Service: Vascular;  Laterality: Left;  . Patch angioplasty Left 12/04/2012    Procedure: PATCH ANGIOPLASTY;  Surgeon: Mal Misty, MD;  Location: [facility];  Service: Vascular;  Laterality: Left;  [facility] Kitchen Eye surgery      bilat cataract surg   . Colonscopy     . Total hip arthroplasty Left 07/30/2014    Procedure: LEFT TOTAL HIP ARTHROPLASTY ANTERIOR APPROACH;  Surgeon: Meredith Pel, MD;  Location[facility];  Service: Orthopedics;  Laterality: Left;  . Tee without cardioversion N/A 09/21/2015    Procedure: TRANSESOPHAGEAL ECHOCARDIOGRAM (TEE);  Surgeon: Larey Dresser, MD;  Location: [facility] ENDOSCOPY;  Service: Cardiovascular;  Laterality: N/A;   Family History  Problem Relation Age of Onset  . Hypertension Mother   . Hyperlipidemia Mother   . Stroke Mother   . Heart attack Mother   . Cancer Mother   . Heart attack Father   . Cancer Brother   . Asthma Maternal Grandfather    Social History  Substance Use Topics  . Smoking status: Former Smoker -- 2.00 packs/day for 36 years    Types: Cigarettes    Quit date: 02/02/2013  . Smokeless tobacco: Never Used  Comment: had restarted and quit again 01/27/12   . Alcohol Use: 0.0 oz/week    0 Standard drinks or equivalent per week     Comment: rare   OB History    No data available     Review of Systems  Constitutional: Negative for fever.  Cardiovascular: Negative for chest pain.  Musculoskeletal: Negative for back pain and neck pain.  Neurological: Positive for weakness. Negative for numbness and headaches.  All other systems reviewed and are negative.     Allergies  Codeine  Home Medications   Prior to Admission medications   Medication Sig Start Date End Date Taking? Authorizing Provider  albuterol (PROVENTIL HFA;VENTOLIN HFA) 108 (90 BASE) MCG/ACT inhaler Inhale 2 puffs into the lungs every 6  (six) hours as needed for wheezing or shortness of breath.    Historical Provider, MD  aspirin 81 MG chewable tablet Chew 1 tablet (81 mg total) by mouth daily. 09/28/15   Mir Marry Guan, MD  atorvastatin (LIPITOR) 20 MG tablet Take 1 tablet (20 mg total) by mouth daily. 12/07/14   [name], MD  budesonide-formoterol [facility]) 160-4.5 MCG/ACT inhaler Inhale 2 puffs into the lungs 2 (two) times daily. 10/26/13   Tammy S Parrett, NP  DULoxetine (CYMBALTA) 60 MG capsule Take 1 capsule (60 mg total) by mouth daily. 12/07/14   [name], MD  ferrous sulfate 325 (65 FE) MG tablet Take 1 tablet (325 mg total) by mouth 2 (two) times daily with a meal. 09/28/15   Mir Marry Guan, MD  gabapentin (NEURONTIN) 100 MG capsule Take 1 capsule (100 mg total) by mouth 3 (three) times daily. 09/28/15   Mir Marry Guan, MD  glipiZIDE (GLUCOTROL) 10 MG tablet TAKE 1 TABLET (10 MG TOTAL) BY MOUTH 2 (TWO) TIMES DAILY BEFORE A MEAL. 06/20/14   [name], MD  metFORMIN (GLUCOPHAGE) 500 MG tablet Take 1 tablet (500 mg total) by mouth 3 (three) times daily. 12/07/14   [name], MD  omeprazole (PRILOSEC) 40 MG capsule Take 1 capsule (40 mg total) by mouth daily before breakfast. 12/07/14   [name], MD  potassium chloride SA (K-DUR,KLOR-CON) 20 MEQ tablet Take 1 tablet (20 mEq total) by mouth daily. 09/28/15   Mir Marry Guan, MD  sitaGLIPtin (JANUVIA) 100 MG tablet Take 1 tablet (100 mg total) by mouth daily. 06/04/14   [name], MD  solifenacin (VESICARE) 10 MG tablet Take 1 tablet (10 mg total) by mouth daily. 06/04/14   [name], MD  spironolactone (ALDACTONE) 25 MG tablet Take 1 tablet (25 mg total) by mouth every morning. 12/07/14   [name], MD  torsemide (DEMADEX) 20 MG tablet Take 2 tablets (40 mg total) by mouth daily. 09/28/15   Mir Marry Guan, MD  traMADol (ULTRAM) 50 MG tablet Take 1 tablet (50 mg total) by mouth every 12 (twelve) hours as needed for severe  pain. 09/28/15   Mir Marry Guan, MD  vitamin B-12 (CYANOCOBALAMIN) 1000 MCG tablet Take 1 tablet (1,000 mcg total) by mouth daily. 11/19/14   Delfina Redwood, MD   BP 117/78 mmHg  Pulse 97  Resp 20  SpO2 95% Physical Exam  Constitutional: She is oriented to person, place, and time. She appears well-developed and well-nourished.  HENT:  Head: Normocephalic and atraumatic.  Right Ear: External ear normal.  Left Ear: External ear normal.  Nose: Nose normal.  Eyes: Right eye exhibits no discharge. Left eye exhibits no  discharge.  Cardiovascular: Normal rate, regular rhythm and normal heart sounds.   Pulses:      Radial pulses are 2+ on the right side, and 2+ on the left side.  Equal ulnar pulses bilaterally  Pulmonary/Chest: Effort normal and breath sounds normal.  Abdominal: She exhibits no distension.  Musculoskeletal:       Right wrist: She exhibits no tenderness.       Right forearm: She exhibits tenderness (mild). She exhibits no swelling.       Arms: Right wrist drop. Unable to extend wrist. Weakened grip on right. Normal sensation. Unable to spread fingers against resistance. Normal strength in rest of RUE.   Neurological: She is alert and oriented to person, place, and time.  Skin: Skin is warm and dry.  Normal capillary refill of hands  Nursing note and vitals reviewed.   ED Course  Procedures (including critical care time) Labs Review Labs Reviewed  BASIC METABOLIC PANEL - Abnormal; Notable for the following:    Chloride 92 (*)    BUN 34 (*)    Creatinine, Ser 1.30 (*)    GFR calc non Af Amer 40 (*)    GFR calc Af Amer 47 (*)    Anion gap 16 (*)    All other components within normal limits  CBC WITH DIFFERENTIAL/PLATELET - Abnormal; Notable for the following:    RBC 5.14 (*)    Hemoglobin 11.4 (*)    MCH 22.2 (*)    MCHC 28.1 (*)    RDW 21.4 (*)    All other components within normal limits    Imaging Review Dg Forearm Right  10/02/2015  CLINICAL  DATA:  Patient unable to extend wrist. No known injury. Initial encounter. EXAM: RIGHT FOREARM - 2 VIEW COMPARISON:  None. FINDINGS: The radius and ulna are intact without evidence for acute fracture. Regional soft tissues are unremarkable. First [facility] joint degenerative change. IMPRESSION: No acute osseous abnormality involving the radius and ulna. Consider dedicated wrist radiographs given clinical history. Electronically Signed   By: Lovey Newcomer M.D.   On: 10/02/2015 13:52   I have personally reviewed and evaluated these images and lab results as part of my medical decision-making.   EKG Interpretation None      MDM   Final diagnoses:  Radial nerve palsy, right    History and exam is c/w radial nerve palsy. Will place in velcro wrist splint. D/w Dr. Leonel Ramsay who recommends outpatient EMG and PT. Agrees with no further workup or CVA workup as this appears to be an isolated radial nerve finding. Pulses and capillary refill equal/normal. Call her PCP for f/u, neuro f/u and discussed return precautions.    Sherwood Gambler, MD 10/02/15 [id number]

## 2015-10-02 NOTE — ED Notes (Signed)
Pt presents via [facility] from [place] home with right hand problem.  Pt reports unable to extend right wrist beginning at 2am.  Pt able to flex and move wrist as well as fingers, only unable to extend.  +PMS, +cap refill.  Neuro screen negative for EMS.  EMS reports abrasion to right forearm, pt reports something fell on her this AM.  BP-110/68 P-70 R-18, CBG-124 O2-91% on chronic 3L O2, 4L at night.  Pt a x 4, NAD.

## 2015-10-02 NOTE — Discharge Instructions (Signed)
Call your doctor Monday (tomorrow) to set up physical therapy for your wrist. Call the neurologist for a follow up appointment for nerve testing. Wear the wrist splint, especially at night. If you develop new or worse weakness, return to the ER immediately

## 2015-10-02 NOTE — ED Notes (Signed)
Patient transported to X-ray 

## 2015-10-02 NOTE — ED Notes (Signed)
Report called to Hinton Dyer at [facility].

## 2015-10-02 NOTE — ED Notes (Signed)
MD Regenia Skeeter aware of pt and deficits noted by RN.

## 2015-10-04 ENCOUNTER — Non-Acute Institutional Stay (SKILLED_NURSING_FACILITY): Payer: Medicare Other | Admitting: Adult Health

## 2015-10-04 ENCOUNTER — Encounter: Payer: Self-pay | Admitting: Adult Health

## 2015-10-04 ENCOUNTER — Ambulatory Visit (HOSPITAL_COMMUNITY)
Admit: 2015-10-04 | Discharge: 2015-10-04 | Disposition: A | Discharge status: Home / Self Care | Payer: No Typology Code available for payment source | Source: Ambulatory Visit | Attending: Cardiology | Admitting: Cardiology

## 2015-10-04 VITALS — BP 92/58 | HR 97 | Wt 174.4 lb

## 2015-10-04 DIAGNOSIS — I2510 Atherosclerotic heart disease of native coronary artery without angina pectoris: Secondary | ICD-10-CM | POA: Diagnosis not present

## 2015-10-04 DIAGNOSIS — G4733 Obstructive sleep apnea (adult) (pediatric): Secondary | ICD-10-CM

## 2015-10-04 DIAGNOSIS — I272 Other secondary pulmonary hypertension: Secondary | ICD-10-CM | POA: Insufficient documentation

## 2015-10-04 DIAGNOSIS — I5032 Chronic diastolic (congestive) heart failure: Secondary | ICD-10-CM | POA: Diagnosis present

## 2015-10-04 DIAGNOSIS — Z9981 Dependence on supplemental oxygen: Secondary | ICD-10-CM | POA: Insufficient documentation

## 2015-10-04 DIAGNOSIS — Z823 Family history of stroke: Secondary | ICD-10-CM | POA: Diagnosis not present

## 2015-10-04 DIAGNOSIS — Z7982 Long term (current) use of aspirin: Secondary | ICD-10-CM | POA: Insufficient documentation

## 2015-10-04 DIAGNOSIS — I350 Nonrheumatic aortic (valve) stenosis: Secondary | ICD-10-CM

## 2015-10-04 DIAGNOSIS — Z885 Allergy status to narcotic agent status: Secondary | ICD-10-CM | POA: Diagnosis not present

## 2015-10-04 DIAGNOSIS — I082 Rheumatic disorders of both aortic and tricuspid valves: Secondary | ICD-10-CM | POA: Diagnosis not present

## 2015-10-04 DIAGNOSIS — F411 Generalized anxiety disorder: Secondary | ICD-10-CM | POA: Diagnosis not present

## 2015-10-04 DIAGNOSIS — E662 Morbid (severe) obesity with alveolar hypoventilation: Secondary | ICD-10-CM | POA: Diagnosis not present

## 2015-10-04 DIAGNOSIS — N183 Chronic kidney disease, stage 3 (moderate): Secondary | ICD-10-CM | POA: Diagnosis not present

## 2015-10-04 DIAGNOSIS — Z7984 Long term (current) use of oral hypoglycemic drugs: Secondary | ICD-10-CM | POA: Insufficient documentation

## 2015-10-04 DIAGNOSIS — Z79899 Other long term (current) drug therapy: Secondary | ICD-10-CM | POA: Diagnosis not present

## 2015-10-04 DIAGNOSIS — J441 Chronic obstructive pulmonary disease with (acute) exacerbation: Secondary | ICD-10-CM

## 2015-10-04 DIAGNOSIS — M797 Fibromyalgia: Secondary | ICD-10-CM

## 2015-10-04 DIAGNOSIS — R911 Solitary pulmonary nodule: Secondary | ICD-10-CM | POA: Diagnosis not present

## 2015-10-04 DIAGNOSIS — J449 Chronic obstructive pulmonary disease, unspecified: Secondary | ICD-10-CM | POA: Diagnosis not present

## 2015-10-04 DIAGNOSIS — Z8249 Family history of ischemic heart disease and other diseases of the circulatory system: Secondary | ICD-10-CM | POA: Insufficient documentation

## 2015-10-04 DIAGNOSIS — I130 Hypertensive heart and chronic kidney disease with heart failure and stage 1 through stage 4 chronic kidney disease, or unspecified chronic kidney disease: Secondary | ICD-10-CM | POA: Insufficient documentation

## 2015-10-04 DIAGNOSIS — Z87891 Personal history of nicotine dependence: Secondary | ICD-10-CM | POA: Diagnosis not present

## 2015-10-04 NOTE — Patient Instructions (Signed)
You have been referred to [facility]  Pulmonary- for further evaluation of COPD and pulmonary nodule  Your physician recommends that you schedule a follow-up appointment in: 3 weeks with Dr.McLean  Do the following things EVERYDAY: 1) Weigh yourself in the morning before breakfast. Write it down and keep it in a log. 2) Take your medicines as prescribed 3) Eat low salt foods-Limit salt (sodium) to 2000 mg per day.  4) Stay as active as you can everyday 5) Limit all fluids for the day to less than 2 liters 6)

## 2015-10-04 NOTE — Progress Notes (Signed)
Patient ID: Jillian Bright, female   DOB: 1946/04/03, 70 y.o.   MRN: [id number]   Facility:  [facility]     CODE STATUS: DNR  Allergies  Allergen Reactions  . Codeine Itching    Takes vicodin at home    Chief Complaint  Patient presents with  . Acute Visit    Pain and Anxiety    HPI:  She is complaining of pain burning in nature in her bilateral feet. She is stable to ambulate despite her pain. She does have a history of anxiety and tells me that she took xanax three times daily on a routine basis at home. We discussed her pain management and discussed that narcotics are not the answer to long term pain management. We also discussed her anxiety medications.    Past Medical History  Diagnosis Date  . Diabetes mellitus   . COPD (chronic obstructive pulmonary disease) ([place])     sees Dr. [name]  . Sleep apnea, obstructive   . Pulmonary HTN ([place])   . Obesity (BMI 30.0-34.9)   . Hyperlipidemia   . Myocardial infarction ([place])   . Coronary artery disease   . Anxiety   . Shortness of breath   . GERD (gastroesophageal reflux disease)   . Arthritis     rheumatoid, sees Dr. Tobie Lords  . CHF (congestive heart failure) ([place])   . Pneumonia   . Bronchitis     hx of   . History of hiatal hernia   . Acute on chronic diastolic (congestive) heart failure ([place]) 09/07/2015  . Aortic stenosis 01/14/2012  . Pleural effusion on right 09/07/2015  . Tricuspid regurgitation   . Right-sided congestive heart failure ([place])   . Mitral regurgitation     Past Surgical History  Procedure Laterality Date  . Appendectomy    . Tonsillectomy and adenoidectomy    . Lumbar disc surgery    . Hemorrhoid surgery    . Coronary angioplasty    . Endarterectomy Left 12/04/2012    Procedure: ENDARTERECTOMY CAROTID;  Surgeon: Mal Misty, MD;  Location: [facility];  Service: Vascular;  Laterality: Left;  . Patch angioplasty Left 12/04/2012    Procedure: PATCH ANGIOPLASTY;  Surgeon: Mal Misty, MD;   Location: [facility];  Service: Vascular;  Laterality: Left;  Marland Kitchen Eye surgery      bilat cataract surg   . Colonscopy     . Total hip arthroplasty Left 07/30/2014    Procedure: LEFT TOTAL HIP ARTHROPLASTY ANTERIOR APPROACH;  Surgeon: Meredith Pel, MD;  Location[facility];  Service: Orthopedics;  Laterality: Left;  . Tee without cardioversion N/A 09/21/2015    Procedure: TRANSESOPHAGEAL ECHOCARDIOGRAM (TEE);  Surgeon: Larey Dresser, MD;  Location[facility] END[facility];  Service: Cardiovascular;  Laterality: N/A;    Social History   Social History  . Marital Status: Widowed    Spouse Name: N/A  . Number of Children: N/A  . Years of Education: N/A   Occupational History  . used to work as a Theme park manager    Social History Main Topics  . Smoking status: Former Smoker -- 2.00 packs/day for 36 years    Types: Cigarettes    Quit date: 02/02/2013  . Smokeless tobacco: Never Used     Comment: had restarted and quit again 01/27/12   . Alcohol Use: 0.0 oz/week    0 Standard drinks or equivalent per week     Comment: rare  . Drug Use: No  . Sexual Activity:  Not on file   Other Topics Concern  . Not on file   Social History Narrative   Lives alone   Family History  Problem Relation Age of Onset  . Hypertension Mother   . Hyperlipidemia Mother   . Stroke Mother   . Heart attack Mother   . Cancer Mother   . Heart attack Father   . Cancer Brother   . Asthma Maternal Grandfather       VITAL SIGNS BP 119/81 mmHg  Pulse 120  Temp(Src) 98.6 F (37 C) (Oral)  Resp 19  Ht '5\' 8"'$  (1.727 m)  Wt 172 lb 4 oz (78.132 kg)  BMI 26.20 kg/m2  SpO2 96%  Patient's Medications  New Prescriptions   No medications on file  Previous Medications   ALBUTEROL (PROVENTIL HFA;VENTOLIN HFA) 108 (90 BASE) MCG/ACT INHALER    Inhale 2 puffs into the lungs every 6 (six) hours as needed for wheezing or shortness of breath.   ASPIRIN 81 MG CHEWABLE TABLET    Chew 1 tablet (81 mg total) by mouth daily.    ATORVASTATIN (LIPITOR) 20 MG TABLET    Take 1 tablet (20 mg total) by mouth daily.   BUDESONIDE-FORMOTEROL (SYMBICORT) 160-4.5 MCG/ACT INHALER    Inhale 2 puffs into the lungs 2 (two) times daily.   DULOXETINE (CYMBALTA) 60 MG CAPSULE    Take 1 capsule (60 mg total) by mouth daily.   FERROUS SULFATE 325 (65 FE) MG TABLET    Take 1 tablet (325 mg total) by mouth 2 (two) times daily with a meal.   GABAPENTIN (NEURONTIN) 400 MG CAPSULE    Take 400 mg by mouth 3 (three) times daily.   GLIPIZIDE (GLUCOTROL) 10 MG TABLET    TAKE 1 TABLET (10 MG TOTAL) BY MOUTH 2 (TWO) TIMES DAILY BEFORE A MEAL.   METFORMIN (GLUCOPHAGE) 500 MG TABLET    Take 1 tablet (500 mg total) by mouth 3 (three) times daily.   OMEPRAZOLE (PRILOSEC) 40 MG CAPSULE    Take 1 capsule (40 mg total) by mouth daily before breakfast.   POTASSIUM CHLORIDE SA (K-DUR,KLOR-CON) 20 MEQ TABLET    Take 1 tablet (20 mEq total) by mouth daily.   SITAGLIPTIN (JANUVIA) 100 MG TABLET    Take 1 tablet (100 mg total) by mouth daily.   SOLIFENACIN (VESICARE) 10 MG TABLET    Take 1 tablet (10 mg total) by mouth daily.   SPIRONOLACTONE (ALDACTONE) 25 MG TABLET    Take 1 tablet (25 mg total) by mouth every morning.   TORSEMIDE (DEMADEX) 20 MG TABLET    Take 2 tablets (40 mg total) by mouth daily.   TRAMADOL (ULTRAM) 50 MG TABLET    Take 1 tablet (50 mg total) by mouth every 12 (twelve) hours as needed for severe pain.   VITAMIN B-12 (CYANOCOBALAMIN) 1000 MCG TABLET    Take 1 tablet (1,000 mcg total) by mouth daily.  Modified Medications   No medications on file  Discontinued Medications   No medications on file     SIGNIFICANT DIAGNOSTIC EXAMS  09-13-15: ct angio of chest: Negative for pulmonary embolus. Left upper lobe pulmonary nodule has enlarged since the prior CT scan and could be a bronchogenic carcinoma. PET CT scan is recommended for further evaluation. Moderate right pleural effusion with associated compressive atelectasis. Marked  cardiomegaly. Calcific aortic and coronary atherosclerosis.  09-19-15: chest x-ray: Cardiac enlargement suggesting cardiomegaly as seen on 09/13/2015 CT scan. Vascular congestion with mild interstitial prominence  suggests congestive heart failure with mild pulmonary edema. Stable small right effusion with underlying consolidation pair  09-21-15: 2-d echo: - Left ventricle: Normal LV size with D-shaped septum suggesting RV pressure/volume overload. LV EF 45-50%, diffuse hypokinesis. - Aortic valve: The aortic valve was trileaflet with severe calcification. There was moderate to severe aortic stenosis . - Aorta: Normal caliber aorta with grade III plaque in the descending thoracic aorta. - Mitral valve: There was mild to moderate regurgitation. - Left atrium: The atrium was mildly dilated. No evidence of  thrombus in the atrial cavity or appendage. - Right ventricle: The cavity size was severely dilated. Systolic  function was mildly to moderately reduced. - Right atrium: The atrium was moderately to severely dilated. - Atrial septum: No defect or patent foramen ovale was identified. - Tricuspid valve: Peak RV-RA gradient 22 mmHg. There was moderate regurgitation.  09-21-15: KUB: Normal small bowel gas pattern. Moderate colonic stool as described above. Mild levoscoliosis and degenerative changes lumbar spine.  09-21-15: chest x-ray: Small right pleural effusion with right basilar atelectasis or infiltrate. Right arm PICC line in place.   09-21-15: ct of chest: 1. Motion degraded exam. 2. Decrease in small right pleural effusion since the prior CT. Slight worsening right base airspace disease, most consistent with infection or aspiration. 3. Patchy left base airspace disease is primarily felt to represent atelectasis. Concurrent infection cannot be excluded, especially laterally. 4. Cardiomegaly. Atherosclerosis, including within the coronary arteries. 5. Pulmonary artery enlargement suggests  pulmonary arterial hypertension. 6. Aortic valvular calcifications could represent valvular disease. Echocardiography could be informative. 7. A left upper lobe pulmonary nodule is similar to on the most recent exam, and remains suspicious for primary bronchogenic carcinoma. Mild thoracic adenopathy which is indeterminate and could be reactive or metastatic. Recommend attention on follow-up.     LABS REVIEWED:   09-08-15: hgb a1c 7.3; vit B 12: 414; folate 18.2; iron 10; tibc 465; ferritin 8 09-14-15: glucose 165; bun 26; creat 1.08; k+ 4.4 ;na++135; liver normal albumin 3.3 09-18-15: wbc 5.7; hgb 8.8 ;hct 33.9; mcv 77.8 ;plt 169; glucose 28; creat 1.12; k+ 3.8; na++130; mag 1.2 09-21-15: wbc 5.9; hgb 8.5; hct 32.4; mcv 77.9 ;plt 202; glucose 206; bun 45; creat 1.58; k+ 2.8; na++13; mag 1.4; total bili 1.3; albumin 3.4; chol 125 09-24-15: wbc 81.; hgb 10.2; hct 38.0; mcv 77.6; plt 302; glucose 127; bun 65; create 1.39; k+ 2.8; na++132; phos 3.9; mag 1.7; albumin 3.2 09-28-15: glucose 142; bun 36; creat 1.07; k+ 3.7; na++134      Review of Systems  Constitutional: Negative for malaise/fatigue.  Respiratory: Negative for cough and shortness of breath.   Cardiovascular: Negative for chest pain, palpitations and leg swelling.  Gastrointestinal: Negative for heartburn, abdominal pain and constipation.  Musculoskeletal: Negative for myalgias, back pain and joint pain.  Skin: Negative.   Neurological: Negative for dizziness.  Psychiatric/Behavioral: The patient is not nervous/anxious.     Physical Exam  Constitutional: She is oriented to person, place, and time. No distress.     Eyes: Conjunctivae are normal.  Neck: Neck supple. No JVD present. No thyromegaly present.  Cardiovascular: Normal rate, regular rhythm and intact distal pulses.   Respiratory: Effort normal and breath sounds normal. No respiratory distress. She has no wheezes.  02 dependent   GI: Soft. Bowel sounds are normal. She  exhibits no distension. There is no tenderness.  Musculoskeletal: She exhibits no edema.  Able to move all extremities   Lymphadenopathy:  She has no cervical adenopathy.  Neurological: She is alert and oriented to person, place, and time.  Skin: Skin is warm and dry. She is not diaphoretic.  Bilateral lower extremities wrapped in coban to prevent edema.   Psychiatric: She has a normal mood and affect.      ASSESSMENT/ PLAN:  8.  Fibromyalgia: will continue  cymbalta 60 mg daily has ultram 50 mg twice daily as needed   Will increase her neurontin to 600 mg three times daily and will monitor   9. Anxiety: will continue cymbalta 60 mg daily will begin xanax 0.5 mg three times daily as needed    Ok Edwards NP [facility]  Contact [phone] Monday through Friday 8am- 5pm  After hours call [phone]

## 2015-10-04 NOTE — Progress Notes (Signed)
Patient ID: Jillian Bright, female   DOB: [birth year], 70 y.o.   MRN: [id number]    Advanced Heart Failure Clinic Note   Primary Care: Alysia Penna, MD Primary Cardiologist: Dr. Aundra Dubin   HPI:  70 year old female history of coronary artery disease, congestive heart failure, diabetes mellitus, oxygen dependent COPD, obstructive sleep apnea-intolerant to CPAP, pulmonary hypertension, obesity, hyperlipidemia. She was last seen by Dr. Aundra Dubin in August 2013. She initially presented with peripheral edema in 2009 and had a normal stress test and preserved EF on echocardiogram. She had a right heart catheterization which showed mild/moderate pulmonary hypertension which responded well to oxygen with decreased PA pressure. She had 2-D echocardiogram on 09/08/2015 revealing normal ejection fraction of 60-65% and wall motion. Grade 1 diastolic dysfunction. Moderate to severe aortic stenosis where previously was moderate. Mild MR. Left atrium mildly dilated. Peak PA pressure 61 mmHg. Right atrium moderately to severely dilated. Moderate tricuspid valve regurgitation.  Admitted 09/07/15 - 12/03/91 with complicated hospital course inclduing A/C diastolic HF and COPD exacerbation. Thoracentesis 4/25 with 600 cc bloody fluid out. TEE as below, EF 45-50%. Mod-severe AS. Medical management recommended for her AS. 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  She would be high risk for anesthesia for TAVR.  Discharge weight 165 lbs.  She presents today for post hospital follow up. Weight shows up 9 lbs since discharge.  Has been doing well, is at [facility]. Is not enjoying rehab, but she does feel like she is getting stronger. Breathing has been fine.  Denies orthopnea or DOE with her PT.  Has UNNA boots in place still.  Says they have been on since she left the hospital and haven't been changed.  Feels like she can do more than the therapists are doing/letting her do,  but does admit she is getting stronger.   Labs (5/17): K 4.3, creatinine 1.3  ECG (4/17): NSR, RBBB  PMH 1. GERD  2. Hyperlipidemia  3. Hypertension  4. CAD: MI 2002 with PCI to proximal and distal RCA. Adenosine myoview (4/09) with EF 72%, no ischemia or infarction.  5. Diabetes mellitus, type II  6. Chronic diastolic CHF: Echo (8/10) showed EF 60-65% with mild diastolic dysfunction. Echo (12/10) showed mild LVH, EF 60-65%, mild AS (mean gradient 13 mmHg), mildly dilated RV with mild to moderate RV systolic dysfunction, and PASP 59 mmHg (moderate pulmonary hypertension). Echo (11/11): EF 60-65%, mild aortic stenosis with mean gradient 12 mmHg, grade I diastolic dysfunction, normal RV size and systolic function, unable to estimate PA systolic pressure.  - TEE 4/17 with EF 45-50%, D-shaped interventricular septum, severely dilated RV with mild to moderate systolic dysfunction, moderate to severe AS, moderate TR.  7. COPD: Has quit smoking. Chronic home oxygen.  - PFT 06/13/09 FEV1 1.97(76%), FEV1% 73, TLC 5.35(101%), DLCO 65%  8. OSA/obesity-hypoventilation  - PSG 06/19/09 AHI 13  - ABG 06/13/09 7.44/50.6/55 on room air  - Started CPAP in hospital 4/17.  9. Pulmonary hypertension: Suspect secondary to hypoxic pulmonary vasoconstriction (COPD, OHS/OSA), group 3 pulmonary hypertension. RHC (1/11) showed mean RA pressure 15, PA 57/42 mean 41 on room air and PA 43/24 mean 32 on nonrebreather mask, PCWP 15, SVO2 51%, CI 1.9. ANA was negative and V/Q scan did not show evidence for chronic PE. RV appeared improved on 11/11 echo.  10. Aortic stenosis: Moderate to severe. Decided against TAVR during 4/17 admission.  - TEE (4/17) with mean gradient 37 mmHg,  AVA 1.0 cm^2.  11. Pulmonary nodule: Enlarging pulmonary nodule concerning for bronchogenic carcinoma, especially with smoking history.  - Thoracentesis on 4/25, bloody fluid sent for cytology, fluid negative for malignant cells. -  Needs outpatient workup of concerning lung mass => PET/CT and pulmonary followup.  12. CKD: Stage 3.   ROS: All systems reviewed and negative except as per HPI.    Current Outpatient Prescriptions  Medication Sig Dispense Refill  . albuterol (PROVENTIL HFA;VENTOLIN HFA) 108 (90 BASE) MCG/ACT inhaler Inhale 2 puffs into the lungs every 6 (six) hours as needed for wheezing or shortness of breath.    Marland Kitchen aspirin 81 MG chewable tablet Chew 1 tablet (81 mg total) by mouth daily. 30 tablet 0  . atorvastatin (LIPITOR) 20 MG tablet Take 1 tablet (20 mg total) by mouth daily. (Patient taking differently: Take 20 mg by mouth every evening. ) 90 tablet 3  . budesonide-formoterol (SYMBICORT) 160-4.5 MCG/ACT inhaler Inhale 2 puffs into the lungs 2 (two) times daily. 1 Inhaler 5  . DULoxetine (CYMBALTA) 60 MG capsule Take 1 capsule (60 mg total) by mouth daily. 30 capsule 11  . ferrous sulfate 325 (65 FE) MG tablet Take 1 tablet (325 mg total) by mouth 2 (two) times daily with a meal. 90 tablet 0  . gabapentin (NEURONTIN) 400 MG capsule Take 400 mg by mouth 3 (three) times daily.    Marland Kitchen glipiZIDE (GLUCOTROL) 10 MG tablet TAKE 1 TABLET (10 MG TOTAL) BY MOUTH 2 (TWO) TIMES DAILY BEFORE A MEAL. 60 tablet 10  . metFORMIN (GLUCOPHAGE) 500 MG tablet Take 1 tablet (500 mg total) by mouth 3 (three) times daily. 90 tablet 11  . omeprazole (PRILOSEC) 40 MG capsule Take 1 capsule (40 mg total) by mouth daily before breakfast. 90 capsule 3  . potassium chloride SA (K-DUR,KLOR-CON) 20 MEQ tablet Take 1 tablet (20 mEq total) by mouth daily. 30 tablet 0  . sitaGLIPtin (JANUVIA) 100 MG tablet Take 1 tablet (100 mg total) by mouth daily. 90 tablet 3  . solifenacin (VESICARE) 10 MG tablet Take 1 tablet (10 mg total) by mouth daily. 90 tablet 3  . spironolactone (ALDACTONE) 25 MG tablet Take 1 tablet (25 mg total) by mouth every morning. 30 tablet 11  . torsemide (DEMADEX) 20 MG tablet Take 2 tablets (40 mg total) by mouth daily.  30 tablet 0  . traMADol (ULTRAM) 50 MG tablet Take 1 tablet (50 mg total) by mouth every 12 (twelve) hours as needed for severe pain. 30 tablet 0  . vitamin B-12 (CYANOCOBALAMIN) 1000 MCG tablet Take 1 tablet (1,000 mcg total) by mouth daily. 30 tablet 0   No current facility-administered medications for this encounter.    Allergies  Allergen Reactions  . Codeine Itching    Takes vicodin at home     Social History   Social History  . Marital Status: Widowed    Spouse Name: N/A  . Number of Children: N/A  . Years of Education: N/A   Occupational History  . used to work as a Theme park manager    Social History Main Topics  . Smoking status: Former Smoker -- 2.00 packs/day for 36 years    Types: Cigarettes    Quit date: 02/02/2013  . Smokeless tobacco: Never Used     Comment: had restarted and quit again 01/27/12   . Alcohol Use: 0.0 oz/week    0 Standard drinks or equivalent per week     Comment: rare  . Drug Use: No  .  Sexual Activity: Not on file   Other Topics Concern  . Not on file   Social History Narrative   Lives alone      Family History  Problem Relation Age of Onset  . Hypertension Mother   . Hyperlipidemia Mother   . Stroke Mother   . Heart attack Mother   . Cancer Mother   . Heart attack Father   . Cancer Brother   . Asthma Maternal Grandfather     Filed Vitals:   10/04/15 1013  BP: 92/58  Pulse: 97  Weight: 174 lb 6.4 oz (79.107 kg)  SpO2: 92%   Wt Readings from Last 3 Encounters:  10/04/15 174 lb 6.4 oz (79.107 kg)  09/28/15 165 lb 9.6 oz (75.116 kg)  03/08/15 185 lb 3.2 oz (84.006 kg)     PHYSICAL EXAM: General: Well appearing, NAD, in WC HEENT: normal Neck: supple. JVP 8 cm. Carotids 2+ bilat; no bruits. No thyromegaly or nodule noted. Cor: PMI nondisplaced. RRR. No rubs or gallops. 3/6 crescendo-decrescendo murmur RUSB with muffled S2. Lungs: RML RLL mildly decreased. On 4 liters oxygen Abdomen: soft, non-tender, non-distended, no  HSM. No bruits or masses. +BS  Extremities: no cyanosis, clubbing, rash. No edema. UNNA boots in place.  Neuro: A&O x 3, cranial nerves grossly intact. moves all 4 extremities w/o difficulty. Affect pleasant   ASSESSMENT & PLAN:  1. Chronic diastolic CHF: Had prominent RV failure in the setting of OHS/OSA during previous admission. TEE 4/27 with EF 45-50%, D-shaped interventricular septum, severely dilated RV with mild to moderate systolic dysfunction, moderate to severe AS. Though weight is up, volume status looks ok on exam. - Continue torsemide 40 mg daily.  Recent BMET with creatinine up slightly but stable.  She will need very close followup.  - Continue Unna boots.  2. Aortic stenosis: TEE done 4/17, there is AS with mean gradient 37 and AVA 1.0 cm^2 => moderate to severe stenosis (not critical). I suspect that the aortic stenosis may have some hemodynamic significance, her main problem seems to be severe right heart failure. The right heart problems will likely not go away with TAVR, and we would be exposing her to a high risk procedure (may not be able to come off vent). For now, continue medical management of her RV failure. 3. Pulmonary hypertension: Suspect her PH is a combination of OHS/OSA and pulmonary venous hypertension from volume overload.  4. OHS/OSA: Should continue to wear CPAP nightly.   5. COPD: Continue chronic 02.No longer smoking.  6. CAD: h/o RCA PCI. Stable.  - Continue ASA 81 and atorvastatin 20 mg daily 7. Tricuspid regurgitation: Moderate on last TEE.  8. Pulmonary nodule: Enlarging pulmonary nodule concerning for bronchogenic carcinoma, especially with smoking history. - Thoracentesis on 4/25, bloody fluid sent for cytology, fluid negative for malignant cells. - She will need outpatient workup of concerning lung mass => PET/CT and pulmonary followup. Make referal today.  9. CKD stage III:  Recent BMET relatively stable.   Volume status appears stable on  exam. Recent labs relatively stable.  Will order pulmonary referral. Recommend PET scan.  Will have follow up on PA/NP side next week.   Legrand Como [address]" [place], PA-C 10/04/2015 10:37 AM   Patient seen with PA, agree with the above note.  Complicated patient.  I think that the main problem is OHS/OSA with COPD and resulting RV failure/cor pulmonale.  She does have aortic stenosis, moderate to severe by echo.  AS is not critical.  We discussed TAVR, but as main issue is RV failure and she would be very high risk for anesthesia, we decided to hold off.   She needs pulmonary workup for possible lung cancer.  Will make sure that she has followup.   Loralie Champagne 10/04/2015

## 2015-10-04 NOTE — Progress Notes (Signed)
Advanced Heart Failure Medication Review by a Pharmacist  Does the patient  feel that his/her medications are working for him/her?  yes  Has the patient been experiencing any side effects to the medications prescribed?  no  Does the patient measure his/her own blood pressure or blood glucose at home?  yes   Does the patient have any problems obtaining medications due to transportation or finances?   no  Understanding of regimen: good Understanding of indications: good Potential of compliance: excellent Patient understands to avoid NSAIDs. Patient understands to avoid decongestants.  Issues to address at subsequent visits: None   Pharmacist comments:  Jillian Bright is a pleasant 70 yo F presenting from [facility] without a medication list. List faxed from [place] and verified torsemide and spironolactone dosing with nurse. All meds reconciled.   Ruta Hinds. Velva Harman, PharmD, BCPS, CPP Clinical Pharmacist Pager: [phone] Phone: [phone] 10/04/2015 10:50 AM      Time with patient: 4 minutes Preparation and documentation time: 8 minutes Total time: 12 minutes

## 2015-10-06 ENCOUNTER — Encounter: Payer: Self-pay | Admitting: Internal Medicine

## 2015-10-06 NOTE — Progress Notes (Deleted)
MRN: [id number] Name: Jillian Bright  Sex: female Age: 70 y.o. DOB: 1945-10-15  [place] #: Karren Burly Facility/Room:118 Level Of Care: SNF Provider: Inocencio Homes MD Emergency Contacts: Extended Emergency Contact Information Primary Emergency Contact: Lilla Shook [place] Phone: [phone] Mobile Phone: [phone] Relation: Brother Secondary Emergency Contact: Milchuck,Dawn And Leamon Arnt, [place] Phone: [phone] Mobile Phone: [phone] Relation: Friend  Code Status: DNR  Allergies: Codeine  Chief Complaint  Patient presents with  . Discharge Note    HPI: Patient is 70 y.o. female who  Past Medical History  Diagnosis Date  . Diabetes mellitus   . COPD (chronic obstructive pulmonary disease) ([place])     sees Dr. [name]  . Sleep apnea, obstructive   . Pulmonary HTN ([place])   . Obesity (BMI 30.0-34.9)   . Hyperlipidemia   . Myocardial infarction ([place])   . Coronary artery disease   . Anxiety   . Shortness of breath   . GERD (gastroesophageal reflux disease)   . Arthritis     rheumatoid, sees Dr. Tobie Lords  . CHF (congestive heart failure) ([place])   . Pneumonia   . Bronchitis     hx of   . History of hiatal hernia   . Acute on chronic diastolic (congestive) heart failure ([place]) 09/07/2015  . Aortic stenosis 01/14/2012  . Pleural effusion on right 09/07/2015  . Tricuspid regurgitation   . Right-sided congestive heart failure ([place])   . Mitral regurgitation     Past Surgical History  Procedure Laterality Date  . Appendectomy    . Tonsillectomy and adenoidectomy    . Lumbar disc surgery    . Hemorrhoid surgery    . Coronary angioplasty    . Endarterectomy Left 12/04/2012    Procedure: ENDARTERECTOMY CAROTID;  Surgeon: Mal Misty, MD;  Location: [facility];  Service: Vascular;  Laterality: Left;  . Patch angioplasty Left 12/04/2012    Procedure: PATCH ANGIOPLASTY;  Surgeon: Mal Misty, MD;  Location: [facility];  Service: Vascular;  Laterality: Left;  Marland Kitchen Eye surgery      bilat cataract surg   . Colonscopy     . Total hip arthroplasty Left 07/30/2014    Procedure: LEFT TOTAL HIP ARTHROPLASTY ANTERIOR APPROACH;  Surgeon: Meredith Pel, MD;  Location: [facility];  Service: Orthopedics;  Laterality: Left;  . Tee without cardioversion N/A 09/21/2015    Procedure: TRANSESOPHAGEAL ECHOCARDIOGRAM (TEE);  Surgeon: Larey Dresser, MD;  Location: [facility];  Service: Cardiovascular;  Laterality: N/A;      Medication List       This list is accurate as of: 10/06/15 11:06 AM.  Always use your most recent med list.               albuterol 108 (90 Base) MCG/ACT inhaler  Commonly known as:  PROVENTIL HFA;VENTOLIN HFA  Inhale 2 puffs into the lungs every 6 (six) hours as needed for wheezing or shortness of breath.     aspirin 81 MG chewable tablet  Chew 1 tablet (81 mg total) by mouth daily.     atorvastatin 20 MG tablet  Commonly known as:  LIPITOR  Take 1 tablet (20 mg total) by mouth daily.     budesonide-formoterol 160-4.5 MCG/ACT inhaler  Commonly known as:  SYMBICORT  Inhale 2 puffs into the lungs 2 (two) times daily.     DULoxetine 60 MG capsule  Commonly known as:  CYMBALTA  Take 1 capsule (60 mg total) by mouth daily.     ferrous sulfate 325 (65 FE) MG tablet  Take 1 tablet (325 mg total) by mouth 2 (two) times daily with a meal.     gabapentin 400 MG capsule  Commonly known as:  NEURONTIN  Take 400 mg by mouth 3 (three) times daily.     glipiZIDE 10 MG tablet  Commonly known as:  GLUCOTROL  TAKE 1 TABLET (10 MG TOTAL) BY MOUTH 2 (TWO) TIMES DAILY BEFORE A MEAL.     metFORMIN 500 MG tablet  Commonly known as:  GLUCOPHAGE  Take 1 tablet (500 mg total) by mouth 3 (three) times daily.     omeprazole 40 MG capsule  Commonly known as:  PRILOSEC  Take 1 capsule (40 mg total) by mouth daily before breakfast.     potassium chloride SA 20 MEQ tablet   Commonly known as:  K-DUR,KLOR-CON  Take 1 tablet (20 mEq total) by mouth daily.     sitaGLIPtin 100 MG tablet  Commonly known as:  JANUVIA  Take 1 tablet (100 mg total) by mouth daily.     solifenacin 10 MG tablet  Commonly known as:  VESICARE  Take 1 tablet (10 mg total) by mouth daily.     spironolactone 25 MG tablet  Commonly known as:  ALDACTONE  Take 1 tablet (25 mg total) by mouth every morning.     torsemide 20 MG tablet  Commonly known as:  DEMADEX  Take 2 tablets (40 mg total) by mouth daily.     traMADol 50 MG tablet  Commonly known as:  ULTRAM  Take 1 tablet (50 mg total) by mouth every 12 (twelve) hours as needed for severe pain.     vitamin B-12 1000 MCG tablet  Commonly known as:  CYANOCOBALAMIN  Take 1 tablet (1,000 mcg total) by mouth daily.        No orders of the defined types were placed in this encounter.    Immunization History  Administered Date(s) Administered  . Influenza Split 04/03/2011, 02/21/2012, 05/25/2013  . Influenza Whole 04/10/2010  . Influenza,inj,Quad PF,36+ Mos 02/23/2014, 03/08/2015  . Pneumococcal Polysaccharide-23 04/10/2010    Social History  Substance Use Topics  . Smoking status: Former Smoker -- 2.00 packs/day for 36 years    Types: Cigarettes    Quit date: 02/02/2013  . Smokeless tobacco: Never Used     Comment: had restarted and quit again 01/27/12   . Alcohol Use: 0.0 oz/week    0 Standard drinks or equivalent per week     Comment: rare    Family history is    Review of Systems  DATA OBTAINED: from patient, nurse, medical record, family member GENERAL:  no fevers, fatigue, appetite changes SKIN: No itching, rash or wounds EYES: No eye pain, redness, discharge EARS: No earache, tinnitus, change in hearing NOSE: No congestion, drainage or bleeding  MOUTH/THROAT: No mouth or tooth pain, No sore throat RESPIRATORY: No cough, wheezing, SOB CARDIAC: No chest pain, palpitations, lower extremity edema  GI: No  abdominal pain, No N/V/D or constipation, No heartburn or reflux  GU: No dysuria, frequency or urgency, or incontinence  MUSCULOSKELETAL: No unrelieved bone/joint pain NEUROLOGIC: No headache, dizziness or focal weakness PSYCHIATRIC: No c/o anxiety or sadness   There were no vitals filed for this visit.  SpO2 Readings from Last 1 Encounters:  10/04/15 96%        Physical  Exam  GENERAL APPEARANCE: Alert, conversant,  No acute distress.  SKIN: No diaphoresis rash HEAD: Normocephalic, atraumatic  EYES: Conjunctiva/lids clear. Pupils round, reactive. EOMs intact.  EARS: External exam WNL, canals clear. Hearing grossly normal.  NOSE: No deformity or discharge.  MOUTH/THROAT: Lips w/o lesions  RESPIRATORY: Breathing is even, unlabored. Lung sounds are clear   CARDIOVASCULAR: Heart RRR no murmurs, rubs or gallops. No peripheral edema.   GASTROINTESTINAL: Abdomen is soft, non-tender, not distended w/ normal bowel sounds. GENITOURINARY: Bladder non tender, not distended  MUSCULOSKELETAL: No abnormal joints or musculature NEUROLOGIC:  Cranial nerves 2-12 grossly intact. Moves all extremities  PSYCHIATRIC: Mood and affect appropriate to situation, no behavioral issues  Patient Active Problem List   Diagnosis Date Noted  . Hypokalemia 09/29/2015  . Hypomagnesemia 09/29/2015  . Iron deficiency anemia 09/29/2015  . Metabolic alkalosis 87/68/1157  . Type II diabetes mellitus with renal manifestations ([place]) 09/28/2015  . Aortic stenosis, severe 09/28/2015  . Solitary pulmonary nodule   . COPD exacerbation ([place])   . Hypoxemia   . Vitamin B12 deficiency 09/23/2015  . Acute encephalopathy 09/22/2015  . Acute-on-chronic respiratory failure ([place]) 09/22/2015  . Chronic obstructive pulmonary disease ([place])   . Lung nodule   . OSA (obstructive sleep apnea)   . Pleural effusion   . Acute hypoxemic respiratory failure ([place])   . Morbid obesity ([place])   . Tricuspid regurgitation   .  Right-sided congestive heart failure ([place])   . Mitral regurgitation   . Abnormal chest x-ray   . Acute on chronic congestive heart failure ([place])   . Acute on chronic diastolic (congestive) heart failure ([place]) 09/07/2015  . On home oxygen therapy 09/07/2015  . Microcytic anemia 09/07/2015  . CHF (congestive heart failure) ([place]) 09/07/2015  . CHF exacerbation ([place]) 09/07/2015  . Pleural effusion on right 09/07/2015  . Pressure ulcer 11/18/2014  . DM type 2 (diabetes mellitus, type 2) ([place]) 11/18/2014  . Syncope 11/17/2014  . Acute on chronic respiratory failure with hypercapnia ([place]) 11/17/2014  . 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 Weakness 01/29/2014  . Abdominal pain 01/29/2014  . Carotid stenosis 06/23/2013  . Rheumatoid arthritis ([place]) 06/08/2013  . Fibromyalgia 06/08/2013  . Occlusion and stenosis of carotid artery without mention of cerebral infarction 12/02/2012  . AKI (acute kidney injury) ([place]) 02/01/2012  . Aortic stenosis 01/14/2012  . CAD (coronary artery disease) 11/07/2011  . Obesity hypoventilation syndrome ([place]) 06/10/2009  . COPD with emphysema ([place]) 05/30/2009  . Pulmonary HTN ([place]) 05/25/2009  . ABDOMINAL PAIN, LOWER 08/04/2008  . DIABETES MELLITUS, TYPE II 08/18/2007  . Chronic diastolic heart failure ([place]) 08/18/2007  . Other and unspecified hyperlipidemia 08/04/2007  . Generalized anxiety disorder 08/04/2007  . Essential hypertension 08/04/2007  . GERD 08/04/2007  . DEGENERATIVE JOINT DISEASE 08/04/2007  . DEPENDENT EDEMA 08/04/2007    CBC    Component Value Date/Time   WBC 10.0 10/02/2015 1328   RBC 5.14* 10/02/2015 1328   RBC 4.02 09/08/2015 0713   HGB 11.4* 10/02/2015 1328   HCT 40.5 10/02/2015 1328   PLT 222 10/02/2015 1328   MCV 78.8 10/02/2015 1328   LYMPHSABS 2.9 10/02/2015 1328   MONOABS 0.7 10/02/2015 1328   EOSABS 0.0 10/02/2015 1328   BASOSABS 0.0 10/02/2015 1328    CMP     Component Value  Date/Time   NA 137 10/02/2015 1328   K 4.3 10/02/2015 1328   CL 92* 10/02/2015 1328  CO2 29 10/02/2015 1328   GLUCOSE 65 10/02/2015 1328   BUN 34* 10/02/2015 1328   CREATININE 1.30* 10/02/2015 1328   CALCIUM 9.3 10/02/2015 1328   PROT 7.6 09/26/2015 0610   ALBUMIN 3.6 09/26/2015 0610   AST 23 09/26/2015 0610   ALT 17 09/26/2015 0610   ALKPHOS 63 09/26/2015 0610   BILITOT 0.9 09/26/2015 0610   GFRNONAA 40* 10/02/2015 1328   GFRAA 47* 10/02/2015 1328    Lab Results  Component Value Date   HGBA1C 7.3* 09/08/2015     No results found.  Not all labs, radiology exams or other studies done during hospitalization come through on my EPIC note; however they are reviewed by me.    Assessment and Plan  No problem-specific assessment & plan notes found for this encounter.   Inocencio Homes  MD

## 2015-10-07 ENCOUNTER — Encounter: Payer: Self-pay | Admitting: Adult Health

## 2015-10-07 NOTE — Progress Notes (Signed)
Patient ID: Jillian Bright, female   DOB: [birth year], 70 y.o.   MRN: [id number]   Location:  [facility] Room Number: 118-A Place of Service:  SNF (31)   CODE STATUS: DNR  Allergies  Allergen Reactions  . Codeine Itching    Takes vicodin at home    Chief Complaint  Patient presents with  . Acute Visit    HPI:    Past Medical History  Diagnosis Date  . Diabetes mellitus   . COPD (chronic obstructive pulmonary disease) ([place])     sees Dr. [name]  . Sleep apnea, obstructive   . Pulmonary HTN ([place])   . Obesity (BMI 30.0-34.9)   . Hyperlipidemia   . Myocardial infarction ([place])   . Coronary artery disease   . Anxiety   . Shortness of breath   . GERD (gastroesophageal reflux disease)   . Arthritis     rheumatoid, sees Dr. Tobie Lords  . CHF (congestive heart failure) ([place])   . Pneumonia   . Bronchitis     hx of   . History of hiatal hernia   . Acute on chronic diastolic (congestive) heart failure ([place]) 09/07/2015  . Aortic stenosis 01/14/2012  . Pleural effusion on right 09/07/2015  . Tricuspid regurgitation   . Right-sided congestive heart failure ([place])   . Mitral regurgitation     Past Surgical History  Procedure Laterality Date  . Appendectomy    . Tonsillectomy and adenoidectomy    . Lumbar disc surgery    . Hemorrhoid surgery    . Coronary angioplasty    . Endarterectomy Left 12/04/2012    Procedure: ENDARTERECTOMY CAROTID;  Surgeon: Mal Misty, MD;  Location: [facility];  Service: Vascular;  Laterality: Left;  . Patch angioplasty Left 12/04/2012    Procedure: PATCH ANGIOPLASTY;  Surgeon: Mal Misty, MD;  Location: [facility];  Service: Vascular;  Laterality: Left;  Marland Kitchen Eye surgery      bilat cataract surg   . Colonscopy     . Total hip arthroplasty Left 07/30/2014    Procedure: LEFT TOTAL HIP ARTHROPLASTY ANTERIOR APPROACH;  Surgeon: Meredith Pel, MD;  Location: [facility];  Service: Orthopedics;  Laterality: Left;  .  Tee without cardioversion N/A 09/21/2015    Procedure: TRANSESOPHAGEAL ECHOCARDIOGRAM (TEE);  Surgeon: Larey Dresser, MD;  Location: [facility] ENDOSCOPY;  Service: Cardiovascular;  Laterality: N/A;    Social History   Social History  . Marital Status: Widowed    Spouse Name: N/A  . Number of Children: N/A  . Years of Education: N/A   Occupational History  . used to work as a Theme park manager    Social History Main Topics  . Smoking status: Former Smoker -- 2.00 packs/day for 36 years    Types: Cigarettes    Quit date: 02/02/2013  . Smokeless tobacco: Never Used     Comment: had restarted and quit again 01/27/12   . Alcohol Use: 0.0 oz/week    0 Standard drinks or equivalent per week     Comment: rare  . Drug Use: No  . Sexual Activity: Not on file   Other Topics Concern  . Not on file   Social History Narrative   Lives alone   Family History  Problem Relation Age of Onset  . Hypertension Mother   . Hyperlipidemia Mother   . Stroke Mother   . Heart attack Mother   . Cancer Mother   . Heart attack  Father   . Cancer Brother   . Asthma Maternal Grandfather       VITAL SIGNS BP 119/81 mmHg  Pulse 120  Temp(Src) 98.6 F (37 C) (Oral)  Resp 19  Ht '5\' 8"'$  (1.727 m)  Wt 175 lb (79.379 kg)  BMI 26.61 kg/m2  SpO2 96%  Patient's Medications  New Prescriptions   No medications on file  Previous Medications   ALBUTEROL (PROVENTIL HFA;VENTOLIN HFA) 108 (90 BASE) MCG/ACT INHALER    Inhale 2 puffs into the lungs every 6 (six) hours as needed for wheezing or shortness of breath.   ASPIRIN 81 MG CHEWABLE TABLET    Chew 1 tablet (81 mg total) by mouth daily.   ATORVASTATIN (LIPITOR) 20 MG TABLET    Take 1 tablet (20 mg total) by mouth daily.   BUDESONIDE-FORMOTEROL (SYMBICORT) 160-4.5 MCG/ACT INHALER    Inhale 2 puffs into the lungs 2 (two) times daily.   DULOXETINE (CYMBALTA) 60 MG CAPSULE    Take 1 capsule (60 mg total) by mouth daily.   FERROUS SULFATE 325 (65 FE) MG TABLET     Take 1 tablet (325 mg total) by mouth 2 (two) times daily with a meal.   GABAPENTIN (NEURONTIN) 400 MG CAPSULE    Take 400 mg by mouth 3 (three) times daily.   GLIPIZIDE (GLUCOTROL) 10 MG TABLET    TAKE 1 TABLET (10 MG TOTAL) BY MOUTH 2 (TWO) TIMES DAILY BEFORE A MEAL.   METFORMIN (GLUCOPHAGE) 500 MG TABLET    Take 1 tablet (500 mg total) by mouth 3 (three) times daily.   OMEPRAZOLE (PRILOSEC) 40 MG CAPSULE    Take 1 capsule (40 mg total) by mouth daily before breakfast.   POTASSIUM CHLORIDE SA (K-DUR,KLOR-CON) 20 MEQ TABLET    Take 1 tablet (20 mEq total) by mouth daily.   SITAGLIPTIN (JANUVIA) 100 MG TABLET    Take 1 tablet (100 mg total) by mouth daily.   SOLIFENACIN (VESICARE) 10 MG TABLET    Take 1 tablet (10 mg total) by mouth daily.   SPIRONOLACTONE (ALDACTONE) 25 MG TABLET    Take 1 tablet (25 mg total) by mouth every morning.   TORSEMIDE (DEMADEX) 20 MG TABLET    Take 2 tablets (40 mg total) by mouth daily.   TRAMADOL (ULTRAM) 50 MG TABLET    Take 1 tablet (50 mg total) by mouth every 12 (twelve) hours as needed for severe pain.   VITAMIN B-12 (CYANOCOBALAMIN) 1000 MCG TABLET    Take 1 tablet (1,000 mcg total) by mouth daily.  Modified Medications   No medications on file  Discontinued Medications   No medications on file     SIGNIFICANT DIAGNOSTIC EXAMS  09-13-15: ct angio of chest: Negative for pulmonary embolus. Left upper lobe pulmonary nodule has enlarged since the prior CT scan and could be a bronchogenic carcinoma. PET CT scan is recommended for further evaluation. Moderate right pleural effusion with associated compressive atelectasis. Marked cardiomegaly. Calcific aortic and coronary atherosclerosis.  09-19-15: chest x-ray: Cardiac enlargement suggesting cardiomegaly as seen on 09/13/2015 CT scan. Vascular congestion with mild interstitial prominence suggests congestive heart failure with mild pulmonary edema. Stable small right effusion with underlying consolidation  pair  09-21-15: 2-d echo: - Left ventricle: Normal LV size with D-shaped septum suggesting RV pressure/volume overload. LV EF 45-50%, diffuse hypokinesis. - Aortic valve: The aortic valve was trileaflet with severe calcification. There was moderate to severe aortic stenosis . - Aorta: Normal caliber aorta with grade  III plaque in the descending thoracic aorta. - Mitral valve: There was mild to moderate regurgitation. - Left atrium: The atrium was mildly dilated. No evidence of  thrombus in the atrial cavity or appendage. - Right ventricle: The cavity size was severely dilated. Systolic  function was mildly to moderately reduced. - Right atrium: The atrium was moderately to severely dilated. - Atrial septum: No defect or patent foramen ovale was identified. - Tricuspid valve: Peak RV-RA gradient 22 mmHg. There was moderate regurgitation.  09-21-15: KUB: Normal small bowel gas pattern. Moderate colonic stool as described above. Mild levoscoliosis and degenerative changes lumbar spine.  09-21-15: chest x-ray: Small right pleural effusion with right basilar atelectasis or infiltrate. Right arm PICC line in place.   09-21-15: ct of chest: 1. Motion degraded exam. 2. Decrease in small right pleural effusion since the prior CT. Slight worsening right base airspace disease, most consistent with infection or aspiration. 3. Patchy left base airspace disease is primarily felt to represent atelectasis. Concurrent infection cannot be excluded, especially laterally. 4. Cardiomegaly. Atherosclerosis, including within the coronary arteries. 5. Pulmonary artery enlargement suggests pulmonary arterial hypertension. 6. Aortic valvular calcifications could represent valvular disease. Echocardiography could be informative. 7. A left upper lobe pulmonary nodule is similar to on the most recent exam, and remains suspicious for primary bronchogenic carcinoma. Mild thoracic adenopathy which is indeterminate and could be  reactive or metastatic. Recommend attention on follow-up.     LABS REVIEWED:   09-08-15: hgb a1c 7.3; vit B 12: 414; folate 18.2; iron 10; tibc 465; ferritin 8 09-14-15: glucose 165; bun 26; creat 1.08; k+ 4.4 ;na++135; liver normal albumin 3.3 09-18-15: wbc 5.7; hgb 8.8 ;hct 33.9; mcv 77.8 ;plt 169; glucose 28; creat 1.12; k+ 3.8; na++130; mag 1.2 09-21-15: wbc 5.9; hgb 8.5; hct 32.4; mcv 77.9 ;plt 202; glucose 206; bun 45; creat 1.58; k+ 2.8; na++13; mag 1.4; total bili 1.3; albumin 3.4; chol 125 09-24-15: wbc 81.; hgb 10.2; hct 38.0; mcv 77.6; plt 302; glucose 127; bun 65; create 1.39; k+ 2.8; na++132; phos 3.9; mag 1.7; albumin 3.2 09-28-15: glucose 142; bun 36; creat 1.07; k+ 3.7; na++134      Review of Systems  Constitutional: Negative for malaise/fatigue.  Respiratory: Negative for cough and shortness of breath.   Cardiovascular: Negative for chest pain, palpitations and leg swelling.  Gastrointestinal: Negative for heartburn, abdominal pain and constipation.  Musculoskeletal: Negative for myalgias, back pain and joint pain.  Skin: Negative.   Neurological: Negative for dizziness.  Psychiatric/Behavioral: The patient is not nervous/anxious.     Physical Exam  Constitutional: She is oriented to person, place, and time. No distress.     Eyes: Conjunctivae are normal.  Neck: Neck supple. No JVD present. No thyromegaly present.  Cardiovascular: Normal rate, regular rhythm and intact distal pulses.   Respiratory: Effort normal and breath sounds normal. No respiratory distress. She has no wheezes.  02 dependent   GI: Soft. Bowel sounds are normal. She exhibits no distension. There is no tenderness.  Musculoskeletal: She exhibits no edema.  Able to move all extremities   Lymphadenopathy:    She has no cervical adenopathy.  Neurological: She is alert and oriented to person, place, and time.  Skin: Skin is warm and dry. She is not diaphoretic.  Bilateral lower extremities wrapped in  coban to prevent edema.   Psychiatric: She has a normal mood and affect.      ASSESSMENT/ PLAN:  1. Dyslipidemia: will continue lipitor 20 mg daily  total chol 125  2. Diabetes: hgb a1c is 7.3. Will continue glucotrol 10 mg twice daily metformin 500 mg three times daily januvia 100 mg daily   3. Anemia with ckd stage II: hgb 10.2; will continue iron twice daily   4. Gerd: will continue prilosec 40 mg daily  5. UI: will continue vesicare 10 mg daily   6. Hypokalemia will continue 20 meq daily   7. COPD with chronic respiratory failure: is 02 dependent; will continue symbicort 160-4.5 mcg 2 puffs twice daily and has albuterol 2 puffs every 6 hours as needed  8.  Fibromyalgia: will continue neurontin 100 mg three times daily; her dose was lowered in the hospital;  takes cymbalta 60 mg daily has ultram 50 mg twice daily as needed her duragesic was stopped in the hospital   9. Anxiety: will continue cymbalta 60 mg daily  10. Acute on chronic diastolic heart failure: EF is 45-50%; will continue demadex 40 mg daily aldactone 25 mg daily asa 81 mg daily  will continue daily weights  11. Left upper lobe nodule: will need to follow up with pulmonology will monitor  12. Severe aortic stenosis: is being managed medically will monitor    Time spent with patient  50   minutes >50% time spent counseling; reviewing medical record; tests; labs; and developing future plan of care             Ok Edwards NP [facility]  Contact [phone] Monday through Friday 8am- 5pm  After hours call [phone]   This encounter was created in error - please disregard.

## 2015-10-09 NOTE — Progress Notes (Signed)
This encounter was created in error - please disregard.

## 2015-10-10 ENCOUNTER — Non-Acute Institutional Stay (SKILLED_NURSING_FACILITY): Payer: Medicare Other | Admitting: Internal Medicine

## 2015-10-10 ENCOUNTER — Encounter: Payer: Self-pay | Admitting: Internal Medicine

## 2015-10-10 DIAGNOSIS — I2510 Atherosclerotic heart disease of native coronary artery without angina pectoris: Secondary | ICD-10-CM

## 2015-10-10 DIAGNOSIS — M0579 Rheumatoid arthritis with rheumatoid factor of multiple sites without organ or systems involvement: Secondary | ICD-10-CM

## 2015-10-10 DIAGNOSIS — N182 Chronic kidney disease, stage 2 (mild): Secondary | ICD-10-CM

## 2015-10-10 DIAGNOSIS — G4733 Obstructive sleep apnea (adult) (pediatric): Secondary | ICD-10-CM

## 2015-10-10 DIAGNOSIS — I350 Nonrheumatic aortic (valve) stenosis: Secondary | ICD-10-CM

## 2015-10-10 DIAGNOSIS — E662 Morbid (severe) obesity with alveolar hypoventilation: Secondary | ICD-10-CM

## 2015-10-10 DIAGNOSIS — E538 Deficiency of other specified B group vitamins: Secondary | ICD-10-CM

## 2015-10-10 DIAGNOSIS — R911 Solitary pulmonary nodule: Secondary | ICD-10-CM | POA: Diagnosis not present

## 2015-10-10 DIAGNOSIS — I5033 Acute on chronic diastolic (congestive) heart failure: Secondary | ICD-10-CM | POA: Diagnosis not present

## 2015-10-10 DIAGNOSIS — E1122 Type 2 diabetes mellitus with diabetic chronic kidney disease: Secondary | ICD-10-CM

## 2015-10-10 DIAGNOSIS — E876 Hypokalemia: Secondary | ICD-10-CM

## 2015-10-10 DIAGNOSIS — J9601 Acute respiratory failure with hypoxia: Secondary | ICD-10-CM | POA: Diagnosis not present

## 2015-10-10 DIAGNOSIS — D509 Iron deficiency anemia, unspecified: Secondary | ICD-10-CM | POA: Diagnosis not present

## 2015-10-10 DIAGNOSIS — J441 Chronic obstructive pulmonary disease with (acute) exacerbation: Secondary | ICD-10-CM

## 2015-10-10 DIAGNOSIS — I2583 Coronary atherosclerosis due to lipid rich plaque: Secondary | ICD-10-CM

## 2015-10-10 NOTE — Progress Notes (Signed)
MRN: [id number] Name: Jillian Bright  Sex: female Age: 70 y.o. DOB: 04/17/1946  [place] #:  Karren Burly Facility/Room: 118 A Level Of Care: SNF Provider: Hennie Duos Emergency Contacts: Extended Emergency Contact Information Primary Emergency Contact: Lilla Shook [place] Phone: [phone] Mobile Phone: [phone] Relation: Brother Secondary Emergency Contact: Milchuck,Dawn And Leamon Arnt, [place] Phone: [phone] Mobile Phone: [phone] Relation: Friend  Code Status: DNR  Allergies: Codeine  Chief Complaint  Patient presents with  . Discharge Note    HPI: Patient is 70 y.o. female who was found to have acute on chronic diastolic CHF with significant volume overload. Pt was admitted to [facility] from 4/12-5/3 where pt had a prolonged course of diuresis, involving dobutamine drips on several occasons. Hospital course was further complicated by the finding of a L pulmonary nodule with pleural effusion which was tapped. Finally course was complicated by finding of mod aortic stenosis and dema dex wa sstarted and finally pt stabalized. Pt was admitted to SNF with generalized weakness and is now ready to be d/c to home.  Past Medical History  Diagnosis Date  . Diabetes mellitus   . COPD (chronic obstructive pulmonary disease) ([place])     sees Dr. [name]  . Sleep apnea, obstructive   . Pulmonary HTN ([place])   . Obesity (BMI 30.0-34.9)   . Hyperlipidemia   . Myocardial infarction ([place])   . Coronary artery disease   . Anxiety   . Shortness of breath   . GERD (gastroesophageal reflux disease)   . Arthritis     rheumatoid, sees Dr. Tobie Lords  . CHF (congestive heart failure) ([place])   . Pneumonia   . Bronchitis     hx of   . History of hiatal hernia   . Acute on chronic diastolic (congestive) heart failure ([place]) 09/07/2015  . Aortic stenosis 01/14/2012  . Pleural effusion on right 09/07/2015  .  Tricuspid regurgitation   . Right-sided congestive heart failure ([place])   . Mitral regurgitation     Past Surgical History  Procedure Laterality Date  . Appendectomy    . Tonsillectomy and adenoidectomy    . Lumbar disc surgery    . Hemorrhoid surgery    . Coronary angioplasty    . Endarterectomy Left 12/04/2012    Procedure: ENDARTERECTOMY CAROTID;  Surgeon: Mal Misty, MD;  Location: [facility];  Service: Vascular;  Laterality: Left;  . Patch angioplasty Left 12/04/2012    Procedure: PATCH ANGIOPLASTY;  Surgeon: Mal Misty, MD;  Location: [facility];  Service: Vascular;  Laterality: Left;  Marland Kitchen Eye surgery      bilat cataract surg   . Colonscopy     . Total hip arthroplasty Left 07/30/2014    Procedure: LEFT TOTAL HIP ARTHROPLASTY ANTERIOR APPROACH;  Surgeon: Meredith Pel, MD;  Location: [facility];  Service: Orthopedics;  Laterality: Left;  . Tee without cardioversion N/A 09/21/2015    Procedure: TRANSESOPHAGEAL ECHOCARDIOGRAM (TEE);  Surgeon: Larey Dresser, MD;  Location: [facility] ENDOSCOPY;  Service: Cardiovascular;  Laterality: N/A;      Medication List       This list is accurate as of: 10/10/15  2:55 PM.  Always use your most recent med list.               albuterol 108 (90 Base) MCG/ACT inhaler  Commonly known as:  PROVENTIL HFA;VENTOLIN HFA  Inhale  2 puffs into the lungs every 6 (six) hours as needed for wheezing or shortness of breath.     ALPRAZolam 0.5 MG tablet  Commonly known as:  XANAX  Take 0.5 mg by mouth every 8 (eight) hours as needed for anxiety.     aspirin 81 MG chewable tablet  Chew 1 tablet (81 mg total) by mouth daily.     atorvastatin 20 MG tablet  Commonly known as:  LIPITOR  Take 1 tablet (20 mg total) by mouth daily.     budesonide-formoterol 160-4.5 MCG/ACT inhaler  Commonly known as:  SYMBICORT  Inhale 2 puffs into the lungs 2 (two) times daily.     DULoxetine 60 MG capsule  Commonly known as:  CYMBALTA  Take 1 capsule (60 mg total) by mouth  daily.     ferrous sulfate 325 (65 FE) MG tablet  Take 1 tablet (325 mg total) by mouth 2 (two) times daily with a meal.     gabapentin 400 MG capsule  Commonly known as:  NEURONTIN  Take 400 mg by mouth 3 (three) times daily.     glipiZIDE 10 MG tablet  Commonly known as:  GLUCOTROL  TAKE 1 TABLET (10 MG TOTAL) BY MOUTH 2 (TWO) TIMES DAILY BEFORE A MEAL.     metFORMIN 500 MG tablet  Commonly known as:  GLUCOPHAGE  Take 1 tablet (500 mg total) by mouth 3 (three) times daily.     omeprazole 40 MG capsule  Commonly known as:  PRILOSEC  Take 1 capsule (40 mg total) by mouth daily before breakfast.     potassium chloride SA 20 MEQ tablet  Commonly known as:  K-DUR,KLOR-CON  Take 1 tablet (20 mEq total) by mouth daily.     sitaGLIPtin 100 MG tablet  Commonly known as:  JANUVIA  Take 1 tablet (100 mg total) by mouth daily.     solifenacin 10 MG tablet  Commonly known as:  VESICARE  Take 1 tablet (10 mg total) by mouth daily.     spironolactone 25 MG tablet  Commonly known as:  ALDACTONE  Take 1 tablet (25 mg total) by mouth every morning.     torsemide 20 MG tablet  Commonly known as:  DEMADEX  Take 2 tablets (40 mg total) by mouth daily.     traMADol 50 MG tablet  Commonly known as:  ULTRAM  Take 1 tablet (50 mg total) by mouth every 12 (twelve) hours as needed for severe pain.     vitamin B-12 1000 MCG tablet  Commonly known as:  CYANOCOBALAMIN  Take 1 tablet (1,000 mcg total) by mouth daily.        Meds ordered this encounter  Medications  . ALPRAZolam (XANAX) 0.5 MG tablet    Sig: Take 0.5 mg by mouth every 8 (eight) hours as needed for anxiety.    Immunization History  Administered Date(s) Administered  . Influenza Split 04/03/2011, 02/21/2012, 05/25/2013  . Influenza Whole 04/10/2010  . Influenza,inj,Quad PF,36+ Mos 02/23/2014, 03/08/2015  . Pneumococcal Polysaccharide-23 04/10/2010    Social History  Substance Use Topics  . Smoking status: Former  Smoker -- 2.00 packs/day for 36 years    Types: Cigarettes    Quit date: 02/02/2013  . Smokeless tobacco: Never Used     Comment: had restarted and quit again 01/27/12   . Alcohol Use: 0.0 oz/week    0 Standard drinks or equivalent per week     Comment: rare    Filed Vitals:  10/10/15 1120  BP: 119/81  Pulse: 120  Temp: 98.6 F (37 C)  Resp: 19    Physical Exam  GENERAL APPEARANCE: Alert, conversant. No acute distress.  HEENT: Unremarkable. RESPIRATORY: Breathing is even, unlabored. Lung sounds are clear   CARDIOVASCULAR: Heart RRR no murmurs, rubs or gallops. No peripheral edema.  GASTROINTESTINAL: Abdomen is soft, non-tender, not distended w/ normal bowel sounds.  NEUROLOGIC: Cranial nerves 2-12 grossly intact. Moves all extremities  Patient Active Problem List   Diagnosis Date Noted  . Hypokalemia 09/29/2015  . Hypomagnesemia 09/29/2015  . Iron deficiency anemia 09/29/2015  . Metabolic alkalosis 79/06/4095  . Type II diabetes mellitus with renal manifestations ([place]) 09/28/2015  . Aortic stenosis, severe 09/28/2015  . Solitary pulmonary nodule   . COPD exacerbation ([place])   . Hypoxemia   . Vitamin B12 deficiency 09/23/2015  . Acute encephalopathy 09/22/2015  . Acute-on-chronic respiratory failure ([place]) 09/22/2015  . Chronic obstructive pulmonary disease ([place])   . Lung nodule   . OSA (obstructive sleep apnea)   . Pleural effusion   . Acute hypoxemic respiratory failure ([place])   . Morbid obesity ([place])   . Tricuspid regurgitation   . Right-sided congestive heart failure ([place])   . Mitral regurgitation   . Abnormal chest x-ray   . Acute on chronic congestive heart failure ([place])   . Acute on chronic diastolic (congestive) heart failure ([place]) 09/07/2015  . On home oxygen therapy 09/07/2015  . Microcytic anemia 09/07/2015  . CHF (congestive heart failure) ([place]) 09/07/2015  . CHF exacerbation ([place]) 09/07/2015  . Pleural effusion on right 09/07/2015  . Pressure ulcer  11/18/2014  . DM type 2 (diabetes mellitus, type 2) ([place]) 11/18/2014  . Syncope 11/17/2014  . Acute on chronic respiratory failure with hypercapnia ([place]) 11/17/2014  . 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 Weakness 01/29/2014  . Abdominal pain 01/29/2014  . Carotid stenosis 06/23/2013  . Rheumatoid arthritis ([place]) 06/08/2013  . Fibromyalgia 06/08/2013  . Occlusion and stenosis of carotid artery without mention of cerebral infarction 12/02/2012  . AKI (acute kidney injury) ([place]) 02/01/2012  . Aortic stenosis 01/14/2012  . CAD (coronary artery disease) 11/07/2011  . Obesity hypoventilation syndrome ([place]) 06/10/2009  . COPD with emphysema ([place]) 05/30/2009  . Pulmonary HTN ([place]) 05/25/2009  . ABDOMINAL PAIN, LOWER 08/04/2008  . DIABETES MELLITUS, TYPE II 08/18/2007  . Chronic diastolic heart failure ([place]) 08/18/2007  . Other and unspecified hyperlipidemia 08/04/2007  . Generalized anxiety disorder 08/04/2007  . Essential hypertension 08/04/2007  . GERD 08/04/2007  . DEGENERATIVE JOINT DISEASE 08/04/2007  . DEPENDENT EDEMA 08/04/2007    CBC    Component Value Date/Time   WBC 10.0 10/02/2015 1328   RBC 5.14* 10/02/2015 1328   RBC 4.02 09/08/2015 0713   HGB 11.4* 10/02/2015 1328   HCT 40.5 10/02/2015 1328   PLT 222 10/02/2015 1328   MCV 78.8 10/02/2015 1328   LYMPHSABS 2.9 10/02/2015 1328   MONOABS 0.7 10/02/2015 1328   EOSABS 0.0 10/02/2015 1328   BASOSABS 0.0 10/02/2015 1328    CMP     Component Value Date/Time   NA 137 10/02/2015 1328   K 4.3 10/02/2015 1328   CL 92* 10/02/2015 1328   CO2 29 10/02/2015 1328   GLUCOSE 65 10/02/2015 1328   BUN 34* 10/02/2015 1328   CREATININE 1.30* 10/02/2015 1328   CALCIUM 9.3 10/02/2015 1328   PROT 7.6 09/26/2015 0610   ALBUMIN 3.6 09/26/2015 0610  AST 23 09/26/2015 0610   ALT 17 09/26/2015 0610   ALKPHOS 63 09/26/2015 0610   BILITOT 0.9 09/26/2015 0610   GFRNONAA 40* 10/02/2015 1328   GFRAA  47* 10/02/2015 1328    Assessment and Plan  Pt is d/c to home with HH/OT/PT. Medications have been reconciled and rx's written.   Time spent > 30 min;> 50% of time with patient was spent reviewing records, labs, tests and studies, counseling and developing plan of care  Hennie Duos, MD

## 2015-10-11 ENCOUNTER — Encounter (HOSPITAL_COMMUNITY): Payer: Medicare Other

## 2015-10-11 ENCOUNTER — Ambulatory Visit (HOSPITAL_COMMUNITY)
Admission: RE | Admit: 2015-10-11 | Discharge: 2015-10-11 | Disposition: A | Discharge status: Home / Self Care | Payer: No Typology Code available for payment source | Source: Ambulatory Visit | Attending: Cardiology | Admitting: Cardiology

## 2015-10-11 VITALS — BP 96/64 | HR 108 | Wt 177.8 lb

## 2015-10-11 DIAGNOSIS — Z885 Allergy status to narcotic agent status: Secondary | ICD-10-CM | POA: Diagnosis not present

## 2015-10-11 DIAGNOSIS — J449 Chronic obstructive pulmonary disease, unspecified: Secondary | ICD-10-CM | POA: Diagnosis present

## 2015-10-11 DIAGNOSIS — I252 Old myocardial infarction: Secondary | ICD-10-CM | POA: Diagnosis not present

## 2015-10-11 DIAGNOSIS — R911 Solitary pulmonary nodule: Secondary | ICD-10-CM | POA: Insufficient documentation

## 2015-10-11 DIAGNOSIS — Z7984 Long term (current) use of oral hypoglycemic drugs: Secondary | ICD-10-CM | POA: Insufficient documentation

## 2015-10-11 DIAGNOSIS — Z87891 Personal history of nicotine dependence: Secondary | ICD-10-CM | POA: Diagnosis not present

## 2015-10-11 DIAGNOSIS — Z7982 Long term (current) use of aspirin: Secondary | ICD-10-CM | POA: Diagnosis not present

## 2015-10-11 DIAGNOSIS — I2583 Coronary atherosclerosis due to lipid rich plaque: Secondary | ICD-10-CM

## 2015-10-11 DIAGNOSIS — E785 Hyperlipidemia, unspecified: Secondary | ICD-10-CM | POA: Diagnosis not present

## 2015-10-11 DIAGNOSIS — I082 Rheumatic disorders of both aortic and tricuspid valves: Secondary | ICD-10-CM | POA: Diagnosis not present

## 2015-10-11 DIAGNOSIS — Z79899 Other long term (current) drug therapy: Secondary | ICD-10-CM | POA: Diagnosis not present

## 2015-10-11 DIAGNOSIS — I071 Rheumatic tricuspid insufficiency: Secondary | ICD-10-CM

## 2015-10-11 DIAGNOSIS — Z8249 Family history of ischemic heart disease and other diseases of the circulatory system: Secondary | ICD-10-CM | POA: Diagnosis not present

## 2015-10-11 DIAGNOSIS — I2510 Atherosclerotic heart disease of native coronary artery without angina pectoris: Secondary | ICD-10-CM

## 2015-10-11 DIAGNOSIS — Z823 Family history of stroke: Secondary | ICD-10-CM | POA: Insufficient documentation

## 2015-10-11 DIAGNOSIS — I5032 Chronic diastolic (congestive) heart failure: Secondary | ICD-10-CM | POA: Insufficient documentation

## 2015-10-11 DIAGNOSIS — E662 Morbid (severe) obesity with alveolar hypoventilation: Secondary | ICD-10-CM

## 2015-10-11 DIAGNOSIS — I272 Other secondary pulmonary hypertension: Secondary | ICD-10-CM | POA: Diagnosis not present

## 2015-10-11 DIAGNOSIS — I509 Heart failure, unspecified: Secondary | ICD-10-CM

## 2015-10-11 DIAGNOSIS — K219 Gastro-esophageal reflux disease without esophagitis: Secondary | ICD-10-CM | POA: Diagnosis not present

## 2015-10-11 DIAGNOSIS — I50810 Right heart failure, unspecified: Secondary | ICD-10-CM

## 2015-10-11 DIAGNOSIS — I130 Hypertensive heart and chronic kidney disease with heart failure and stage 1 through stage 4 chronic kidney disease, or unspecified chronic kidney disease: Secondary | ICD-10-CM | POA: Insufficient documentation

## 2015-10-11 DIAGNOSIS — G4733 Obstructive sleep apnea (adult) (pediatric): Secondary | ICD-10-CM | POA: Insufficient documentation

## 2015-10-11 DIAGNOSIS — N183 Chronic kidney disease, stage 3 (moderate): Secondary | ICD-10-CM | POA: Insufficient documentation

## 2015-10-11 DIAGNOSIS — Z9981 Dependence on supplemental oxygen: Secondary | ICD-10-CM | POA: Insufficient documentation

## 2015-10-11 DIAGNOSIS — E1122 Type 2 diabetes mellitus with diabetic chronic kidney disease: Secondary | ICD-10-CM | POA: Insufficient documentation

## 2015-10-11 LAB — BASIC METABOLIC PANEL
ANION GAP: 13 (ref 5–15)
BUN: 27 mg/dL — ABNORMAL HIGH (ref 6–20)
CALCIUM: 8.2 mg/dL — AB (ref 8.9–10.3)
CHLORIDE: 92 mmol/L — AB (ref 101–111)
CO2: 28 mmol/L (ref 22–32)
CREATININE: 1.05 mg/dL — AB (ref 0.44–1.00)
GFR calc non Af Amer: 52 mL/min — ABNORMAL LOW (ref >60)
Glucose, Bld: 126 mg/dL — ABNORMAL HIGH (ref 65–99)
Potassium: 5.6 mmol/L — ABNORMAL HIGH (ref 3.5–5.1)
SODIUM: 133 mmol/L — AB (ref 135–145)

## 2015-10-11 MED ORDER — TORSEMIDE 20 MG PO TABS: 60.0000 mg | ORAL_TABLET | Freq: Every day | ORAL | Status: DC

## 2015-10-11 NOTE — Patient Instructions (Signed)
INCREASE Torsemide to 60 mg (3 tabs) daily  Labs today  Your physician recommends that you schedule a follow-up appointment as scheduled with Dr.McLean  Do the following things EVERYDAY: 1) Weigh yourself in the morning before breakfast. Write it down and keep it in a log. 2) Take your medicines as prescribed 3) Eat low salt foods-Limit salt (sodium) to 2000 mg per day.  4) Stay as active as you can everyday 5) Limit all fluids for the day to less than 2 liters 6)

## 2015-10-11 NOTE — Progress Notes (Signed)
Patient ID: Jillian Bright, female   DOB: 24-Jan-1946, 70 y.o.   MRN: [id number]    Advanced Heart Failure Clinic Note   Primary Care: Alysia Penna, MD Primary Cardiologist: Dr. Aundra Dubin   HPI:  70 year old female history of coronary artery disease, congestive heart failure, diabetes mellitus, oxygen dependent COPD, obstructive sleep apnea-intolerant to CPAP, pulmonary hypertension, obesity, hyperlipidemia. She was last seen by Dr. Aundra Dubin in August 2013. She initially presented with peripheral edema in 2009 and had a normal stress test and preserved EF on echocardiogram. She had a right heart catheterization which showed mild/moderate pulmonary hypertension which responded well to oxygen with decreased PA pressure. She had 2-D echocardiogram on 09/08/2015 revealing normal ejection fraction of 60-65% and wall motion. Grade 1 diastolic dysfunction. Moderate to severe aortic stenosis where previously was moderate. Mild MR. Left atrium mildly dilated. Peak PA pressure 61 mmHg. Right atrium moderately to severely dilated. Moderate tricuspid valve regurgitation.  Admitted 09/07/15 - 4/0/98 with complicated hospital course inclduing A/C diastolic HF and COPD exacerbation. Thoracentesis 4/25 with 600 cc bloody fluid out. TEE as below, EF 45-50%. Mod-severe AS. Medical management recommended for her AS. 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  She would be high risk for anesthesia for TAVR.  Discharge weight 165 lbs.  She presents today for regular follow up. She is up 3 lbs from last visit (up 12 lbs overall from hospital discharge). Remains at [facility], is up for discharge tomorrow. She states the facility doesn't understand how to do UNNA boots, and she refuses to do compression hose. Is set to have [place], PT, OT once discharged. Has been walking with PT, feels like she can get farther than prior to hospital.  She does feel like she has a little more  swelling in her legs and that her legs are painful as a result. Feels like appetite has gotten better since she left the hospital.   Labs (5/17): K 4.3, creatinine 1.3  ECG (4/17): NSR, RBBB  PMH 1. GERD  2. Hyperlipidemia  3. Hypertension  4. CAD: MI 2002 with PCI to proximal and distal RCA. Adenosine myoview (4/09) with EF 72%, no ischemia or infarction.  5. Diabetes mellitus, type II  6. Chronic diastolic CHF: Echo (1/19) showed EF 60-65% with mild diastolic dysfunction. Echo (12/10) showed mild LVH, EF 60-65%, mild AS (mean gradient 13 mmHg), mildly dilated RV with mild to moderate RV systolic dysfunction, and PASP 59 mmHg (moderate pulmonary hypertension). Echo (11/11): EF 60-65%, mild aortic stenosis with mean gradient 12 mmHg, grade I diastolic dysfunction, normal RV size and systolic function, unable to estimate PA systolic pressure.  - TEE 4/17 with EF 45-50%, D-shaped interventricular septum, severely dilated RV with mild to moderate systolic dysfunction, moderate to severe AS, moderate TR.  7. COPD: Has quit smoking. Chronic home oxygen.  - PFT 06/13/09 FEV1 1.97(76%), FEV1% 73, TLC 5.35(101%), DLCO 65%  8. OSA/obesity-hypoventilation  - PSG 06/19/09 AHI 13  - ABG 06/13/09 7.44/50.6/55 on room air  - Started CPAP in hospital 4/17.  9. Pulmonary hypertension: Suspect secondary to hypoxic pulmonary vasoconstriction (COPD, OHS/OSA), group 3 pulmonary hypertension. RHC (1/11) showed mean RA pressure 15, PA 57/42 mean 41 on room air and PA 43/24 mean 32 on nonrebreather mask, PCWP 15, SVO2 51%, CI 1.9. ANA was negative and V/Q scan did not show evidence for chronic PE. RV appeared improved on 11/11 echo.  10. Aortic stenosis: Moderate to severe.  Decided against TAVR during 4/17 admission.  - TEE (4/17) with mean gradient 37 mmHg, AVA 1.0 cm^2.  11. Pulmonary nodule: Enlarging pulmonary nodule concerning for bronchogenic carcinoma, especially with smoking history.  -  Thoracentesis on 4/25, bloody fluid sent for cytology, fluid negative for malignant cells. - Needs outpatient workup of concerning lung mass => PET/CT and pulmonary followup.  12. CKD: Stage 3.   ROS: All systems reviewed and negative except as per HPI.    Current Outpatient Prescriptions  Medication Sig Dispense Refill  . albuterol (PROVENTIL HFA;VENTOLIN HFA) 108 (90 BASE) MCG/ACT inhaler Inhale 2 puffs into the lungs every 6 (six) hours as needed for wheezing or shortness of breath.    . ALPRAZolam (XANAX) 0.5 MG tablet Take 0.5 mg by mouth every 8 (eight) hours as needed for anxiety.    Marland Kitchen aspirin 81 MG chewable tablet Chew 1 tablet (81 mg total) by mouth daily. 30 tablet 0  . atorvastatin (LIPITOR) 20 MG tablet Take 1 tablet (20 mg total) by mouth daily. 90 tablet 3  . budesonide-formoterol (SYMBICORT) 160-4.5 MCG/ACT inhaler Inhale 2 puffs into the lungs 2 (two) times daily. 1 Inhaler 5  . DULoxetine (CYMBALTA) 60 MG capsule Take 1 capsule (60 mg total) by mouth daily. 30 capsule 11  . ferrous sulfate 325 (65 FE) MG tablet Take 1 tablet (325 mg total) by mouth 2 (two) times daily with a meal. 90 tablet 0  . gabapentin (NEURONTIN) 400 MG capsule Take 400 mg by mouth 3 (three) times daily.    Marland Kitchen glipiZIDE (GLUCOTROL) 10 MG tablet TAKE 1 TABLET (10 MG TOTAL) BY MOUTH 2 (TWO) TIMES DAILY BEFORE A MEAL. 60 tablet 10  . metFORMIN (GLUCOPHAGE) 500 MG tablet Take 1 tablet (500 mg total) by mouth 3 (three) times daily. 90 tablet 11  . omeprazole (PRILOSEC) 40 MG capsule Take 1 capsule (40 mg total) by mouth daily before breakfast. 90 capsule 3  . potassium chloride SA (K-DUR,KLOR-CON) 20 MEQ tablet Take 1 tablet (20 mEq total) by mouth daily. 30 tablet 0  . sitaGLIPtin (JANUVIA) 100 MG tablet Take 1 tablet (100 mg total) by mouth daily. 90 tablet 3  . solifenacin (VESICARE) 10 MG tablet Take 1 tablet (10 mg total) by mouth daily. 90 tablet 3  . spironolactone (ALDACTONE) 25 MG tablet Take 1 tablet  (25 mg total) by mouth every morning. 30 tablet 11  . torsemide (DEMADEX) 20 MG tablet Take 2 tablets (40 mg total) by mouth daily. 30 tablet 0  . traMADol (ULTRAM) 50 MG tablet Take 1 tablet (50 mg total) by mouth every 12 (twelve) hours as needed for severe pain. (Patient taking differently: Take 50 mg by mouth every 12 (twelve) hours as needed for severe pain. #30, 0R) 30 tablet 0  . vitamin B-12 (CYANOCOBALAMIN) 1000 MCG tablet Take 1 tablet (1,000 mcg total) by mouth daily. 30 tablet 0   No current facility-administered medications for this encounter.    Allergies  Allergen Reactions  . Codeine Itching    Takes vicodin at home     Social History   Social History  . Marital Status: Widowed    Spouse Name: N/A  . Number of Children: N/A  . Years of Education: N/A   Occupational History  . used to work as a Theme park manager    Social History Main Topics  . Smoking status: Former Smoker -- 2.00 packs/day for 36 years    Types: Cigarettes    Quit date: 02/02/2013  .  Smokeless tobacco: Never Used     Comment: had restarted and quit again 01/27/12   . Alcohol Use: 0.0 oz/week    0 Standard drinks or equivalent per week     Comment: rare  . Drug Use: No  . Sexual Activity: Not on file   Other Topics Concern  . Not on file   Social History Narrative   Lives alone      Family History  Problem Relation Age of Onset  . Hypertension Mother   . Hyperlipidemia Mother   . Stroke Mother   . Heart attack Mother   . Cancer Mother   . Heart attack Father   . Cancer Brother   . Asthma Maternal Grandfather     Filed Vitals:   10/11/15 1416  BP: 96/64  Pulse: 108  Weight: 177 lb 12.8 oz (80.65 kg)  SpO2: 92%   Wt Readings from Last 3 Encounters:  10/11/15 177 lb 12.8 oz (80.65 kg)  10/10/15 172 lb 3.2 oz (78.109 kg)  10/07/15 175 lb (79.379 kg)     PHYSICAL EXAM: General: Well appearing, NAD, in WC HEENT: normal Neck: supple. JVP 7-8 cm. Carotids 2+ bilat; no bruits.  No thyromegaly or nodule noted. Cor: PMI nondisplaced. RRR. No rubs or gallops. 3/6 crescendo-decrescendo murmur RUSB with muffled S2. Lungs: RML RLL mildly decreased. On 4 liters oxygen Abdomen: soft, NT, ND, no HSM. No bruits or masses. +BS  Extremities: no cyanosis, clubbing, rash.UNNA boots in place. Trace edema  Neuro: A&O x 3, cranial nerves grossly intact. moves all 4 extremities w/o difficulty. Affect pleasant   ASSESSMENT & PLAN:  1. Chronic diastolic CHF: Had prominent RV failure in the setting of OHS/OSA during previous admission. TEE 4/27 with EF 45-50%, D-shaped interventricular septum, severely dilated RV with mild to moderate systolic dysfunction, moderate to severe AS. Volume status looks stable, although is getting trace edema back in legs and up 12 lbs from d/c.  - Increase torsemide 60 mg daily. BMET today - Continue Unna boots.  2. Aortic stenosis: TEE done 4/17, there is AS with mean gradient 37 and AVA 1.0 cm^2 => moderate to severe stenosis (not critical). Suspect that the aortic stenosis may have some hemodynamic significance, her main problem seems to be severe right heart failure. The right heart problems will likely not go away with TAVR, and we would be exposing her to a high risk procedure (may not be able to come off vent).  - For now, continue medical management of her RV failure. 3. Pulmonary hypertension: Suspect her PH is a combination of OHS/OSA and pulmonary venous hypertension from volume overload.  4. OHS/OSA: Should continue to wear CPAP nightly.   5. COPD: Continue chronic 02.No longer smoking.  6. CAD: h/o RCA PCI. Stable.  - Continue ASA 81 and atorvastatin 20 mg daily 7. Tricuspid regurgitation: Moderate on last TEE.  8. Pulmonary nodule: Enlarging pulmonary nodule concerning for bronchogenic carcinoma, especially with smoking history. - Thoracentesis on 4/25, bloody fluid sent for cytology, fluid negative for malignant cells. - She will  need outpatient workup of concerning lung mass => PET/CT and pulmonary followup. Has pulmonary follow up next week.   9. CKD stage III:   - BMET today.    Increase torsemide as above.  Check BMET today.  Keep scheduled appt with Dr. Aundra Dubin in 2 weeks.   Legrand Como [address] [place], [place] 10/11/2015 2:38 PM

## 2015-10-13 ENCOUNTER — Other Ambulatory Visit: Payer: Self-pay | Admitting: Internal Medicine

## 2015-10-14 ENCOUNTER — Telehealth: Payer: Self-pay | Admitting: Family Medicine

## 2015-10-14 NOTE — Telephone Encounter (Signed)
Jillian Bright from [facility] home health call to say   Pt and ot will evaluate her.  Unna boot will not be covered by medicare and they are asking if they can discontinue her edema is under control and she has no wounds. Nursing will follow her for medication management .   [phone]

## 2015-10-14 NOTE — Telephone Encounter (Signed)
Yes please DC the [facility] boot

## 2015-10-14 NOTE — Telephone Encounter (Signed)
Per Dr. Sarajane Jews, okay to give verbal order. I called and left a voice message for Monique with order.

## 2015-10-14 NOTE — Telephone Encounter (Signed)
Monique a PT with [facility] call to say pt will start PT next week 2 times a week. She is asking for verbal orders.   [phone].

## 2015-10-14 NOTE — Telephone Encounter (Signed)
I called and left a voice message for Monique with below information.

## 2015-10-17 ENCOUNTER — Ambulatory Visit: Payer: Medicare Other | Admitting: Pulmonary Disease

## 2015-10-17 ENCOUNTER — Emergency Department (HOSPITAL_COMMUNITY): Payer: Medicare Other

## 2015-10-17 ENCOUNTER — Emergency Department (HOSPITAL_COMMUNITY)
Admission: EM | Admit: 2015-10-17 | Discharge: 2015-10-18 | Disposition: A | Discharge status: Home / Self Care | Payer: Medicare Other | Attending: Emergency Medicine | Admitting: Emergency Medicine

## 2015-10-17 ENCOUNTER — Encounter (HOSPITAL_COMMUNITY): Payer: Self-pay | Admitting: Vascular Surgery

## 2015-10-17 ENCOUNTER — Telehealth: Payer: Self-pay | Admitting: Family Medicine

## 2015-10-17 DIAGNOSIS — K219 Gastro-esophageal reflux disease without esophagitis: Secondary | ICD-10-CM | POA: Insufficient documentation

## 2015-10-17 DIAGNOSIS — I5033 Acute on chronic diastolic (congestive) heart failure: Secondary | ICD-10-CM | POA: Diagnosis not present

## 2015-10-17 DIAGNOSIS — E785 Hyperlipidemia, unspecified: Secondary | ICD-10-CM | POA: Diagnosis not present

## 2015-10-17 DIAGNOSIS — M069 Rheumatoid arthritis, unspecified: Secondary | ICD-10-CM | POA: Diagnosis not present

## 2015-10-17 DIAGNOSIS — E669 Obesity, unspecified: Secondary | ICD-10-CM | POA: Diagnosis not present

## 2015-10-17 DIAGNOSIS — I252 Old myocardial infarction: Secondary | ICD-10-CM | POA: Insufficient documentation

## 2015-10-17 DIAGNOSIS — Z87891 Personal history of nicotine dependence: Secondary | ICD-10-CM | POA: Diagnosis not present

## 2015-10-17 DIAGNOSIS — R4182 Altered mental status, unspecified: Secondary | ICD-10-CM | POA: Insufficient documentation

## 2015-10-17 DIAGNOSIS — Z9861 Coronary angioplasty status: Secondary | ICD-10-CM | POA: Diagnosis not present

## 2015-10-17 DIAGNOSIS — Z79899 Other long term (current) drug therapy: Secondary | ICD-10-CM | POA: Diagnosis not present

## 2015-10-17 DIAGNOSIS — F419 Anxiety disorder, unspecified: Secondary | ICD-10-CM | POA: Diagnosis not present

## 2015-10-17 DIAGNOSIS — N390 Urinary tract infection, site not specified: Secondary | ICD-10-CM | POA: Insufficient documentation

## 2015-10-17 DIAGNOSIS — Z7984 Long term (current) use of oral hypoglycemic drugs: Secondary | ICD-10-CM | POA: Insufficient documentation

## 2015-10-17 DIAGNOSIS — Z7951 Long term (current) use of inhaled steroids: Secondary | ICD-10-CM | POA: Insufficient documentation

## 2015-10-17 DIAGNOSIS — J449 Chronic obstructive pulmonary disease, unspecified: Secondary | ICD-10-CM | POA: Insufficient documentation

## 2015-10-17 DIAGNOSIS — Z7982 Long term (current) use of aspirin: Secondary | ICD-10-CM | POA: Diagnosis not present

## 2015-10-17 DIAGNOSIS — I2510 Atherosclerotic heart disease of native coronary artery without angina pectoris: Secondary | ICD-10-CM | POA: Diagnosis not present

## 2015-10-17 DIAGNOSIS — R358 Other polyuria: Secondary | ICD-10-CM | POA: Diagnosis present

## 2015-10-17 DIAGNOSIS — E119 Type 2 diabetes mellitus without complications: Secondary | ICD-10-CM | POA: Insufficient documentation

## 2015-10-17 LAB — CBC WITH DIFFERENTIAL/PLATELET
BASOS ABS: 0 10*3/uL (ref 0.0–0.1)
Basophils Relative: 0 %
Eosinophils Absolute: 0.2 10*3/uL (ref 0.0–0.7)
Eosinophils Relative: 3 %
HCT: 34.9 % — ABNORMAL LOW (ref 36.0–46.0)
Hemoglobin: 10.3 g/dL — ABNORMAL LOW (ref 12.0–15.0)
LYMPHS ABS: 1.2 10*3/uL (ref 0.7–4.0)
Lymphocytes Relative: 18 %
MCH: 24 pg — ABNORMAL LOW (ref 26.0–34.0)
MCHC: 29.5 g/dL — AB (ref 30.0–36.0)
MCV: 81.4 fL (ref 78.0–100.0)
MONO ABS: 0.4 10*3/uL (ref 0.1–1.0)
Monocytes Relative: 6 %
Neutro Abs: 5.1 10*3/uL (ref 1.7–7.7)
Neutrophils Relative %: 73 %
PLATELETS: 164 10*3/uL (ref 150–400)
RBC: 4.29 MIL/uL (ref 3.87–5.11)
RDW: 22.1 % — AB (ref 11.5–15.5)
WBC: 6.9 10*3/uL (ref 4.0–10.5)

## 2015-10-17 LAB — I-STAT ARTERIAL BLOOD GAS, ED
Acid-Base Excess: 11 mmol/L — ABNORMAL HIGH (ref 0.0–2.0)
Bicarbonate: 36.7 mEq/L — ABNORMAL HIGH (ref 20.0–24.0)
O2 Saturation: 94 %
PCO2 ART: 54.1 mmHg — AB (ref 35.0–45.0)
PO2 ART: 72 mmHg — AB (ref 80.0–100.0)
Patient temperature: 98.6
TCO2: 38 mmol/L (ref 0–100)
pH, Arterial: 7.439 (ref 7.350–7.450)

## 2015-10-17 LAB — URINE MICROSCOPIC-ADD ON

## 2015-10-17 LAB — COMPREHENSIVE METABOLIC PANEL
ALBUMIN: 3.5 g/dL (ref 3.5–5.0)
ALT: 17 U/L (ref 14–54)
AST: 26 U/L (ref 15–41)
Alkaline Phosphatase: 63 U/L (ref 38–126)
Anion gap: 10 (ref 5–15)
BILIRUBIN TOTAL: 0.7 mg/dL (ref 0.3–1.2)
BUN: 45 mg/dL — ABNORMAL HIGH (ref 6–20)
CHLORIDE: 89 mmol/L — AB (ref 101–111)
CO2: 35 mmol/L — ABNORMAL HIGH (ref 22–32)
Calcium: 9.5 mg/dL (ref 8.9–10.3)
Creatinine, Ser: 1.49 mg/dL — ABNORMAL HIGH (ref 0.44–1.00)
GFR calc Af Amer: 40 mL/min — ABNORMAL LOW (ref >60)
GFR calc non Af Amer: 34 mL/min — ABNORMAL LOW (ref >60)
GLUCOSE: 122 mg/dL — AB (ref 65–99)
POTASSIUM: 5 mmol/L (ref 3.5–5.1)
SODIUM: 134 mmol/L — AB (ref 135–145)
TOTAL PROTEIN: 7.5 g/dL (ref 6.5–8.1)

## 2015-10-17 LAB — URINALYSIS, ROUTINE W REFLEX MICROSCOPIC
BILIRUBIN URINE: NEGATIVE
Glucose, UA: NEGATIVE mg/dL
Ketones, ur: NEGATIVE mg/dL
Nitrite: NEGATIVE
PH: 7 (ref 5.0–8.0)
Protein, ur: NEGATIVE mg/dL
SPECIFIC GRAVITY, URINE: 1.009 (ref 1.005–1.030)

## 2015-10-17 LAB — RAPID URINE DRUG SCREEN, HOSP PERFORMED
AMPHETAMINES: NOT DETECTED
BENZODIAZEPINES: NOT DETECTED
Barbiturates: NOT DETECTED
Cocaine: NOT DETECTED
OPIATES: NOT DETECTED
Tetrahydrocannabinol: NOT DETECTED

## 2015-10-17 MED ORDER — SODIUM CHLORIDE 0.9 % IV SOLN
INTRAVENOUS | Status: DC
  Administered 2015-10-17: 18:00:00 via INTRAVENOUS

## 2015-10-17 MED ORDER — CEPHALEXIN 500 MG PO CAPS: 500.0000 mg | ORAL_CAPSULE | Freq: Four times a day (QID) | ORAL | Status: DC

## 2015-10-17 MED ORDER — CEPHALEXIN 250 MG PO CAPS
500.0000 mg | ORAL_CAPSULE | Freq: Once | ORAL | Status: AC
  Administered 2015-10-17: 500 mg via ORAL
  Filled 2015-10-17: qty 2

## 2015-10-17 MED ORDER — SODIUM CHLORIDE 0.9 % IV BOLUS (SEPSIS)
1000.0000 mL | Freq: Once | INTRAVENOUS | Status: AC
  Administered 2015-10-17: 1000 mL via INTRAVENOUS

## 2015-10-17 NOTE — Telephone Encounter (Signed)
[facility]  Patient Name: Jillian Bright  DOB: 1945/08/09    Initial Comment Achille [facility] [phone] says a nurse saw PT last week. PT is off balance; her oxygen is very low. The nurse seems to have incorrectly marked the pill box has wrong instruction, some are double dose and other are more than the daily    Nurse Assessment  Nurse: Harlow Mares, RN, Suanne Marker Date/Time (Eastern Time): 10/17/2015 3:47:40 PM  Confirm and document reason for call. If symptomatic, describe symptoms. You must click the next button to save text entered. ---nURSE CAME OUT LAST WEEK AND STARTED services, the client is now confused. Her meds has over taken her medications are not correctly being dispensed. The patient filled her own pill boxes and there are a couple of days missing. The patient is disoriented. Hypotensive 88/61, 77-88% O2 of 3L. 132/82 when admitted. She may have overdosed. (takes Torsemide, Cymbalta, Glipizide, iron, metformin, neurontin, prilosec, K+, citogliptone).  Has the patient traveled out of the country within the last 30 days? ---Not Applicable  Does the patient have any new or worsening symptoms? ---Yes  Will a triage be completed? ---Yes  Related visit to physician within the last 2 weeks? ---N/A  Does the PT have any chronic conditions? (i.e. diabetes, asthma, etc.) ---Unknown  Is this a behavioral health or substance abuse call? ---No     Guidelines    Guideline Title Affirmed Question Affirmed Notes  Poisoning Difficult to awaken or acting confused (e.g., disoriented, slurred speech)    Final Disposition User   Call EMS 911 Now Harlow Mares, RN, Suanne Marker    Disagree/Comply: Leta Baptist

## 2015-10-17 NOTE — ED Notes (Signed)
Pt reports to the ED for eval of possible medication overdose. Per EMS home health nurse called them out because she went to see the patient and she noted her medication box was in disarray. Per home health RN she is on 3 different fluid pills and she was missing today and tomorrows morning medications. Pt denies taking extra medication. She does report she has been urinating more frequently than normal. Pts BP was 80/60 on arrival but came up en route. 12 lead obtained en route unremarkable. Pt A&Ox4, resp e/u, and skin warm and dry. Denies any pain or N/V.

## 2015-10-17 NOTE — Telephone Encounter (Signed)
FYI.  Pt seen in ED

## 2015-10-17 NOTE — ED Provider Notes (Signed)
CSN: [id number]     Arrival date & time 10/17/15  1701 History   First MD Initiated Contact with Patient 10/17/15 1704     Chief Complaint  Patient presents with  . Drug Overdose   PT BROUGHT TO THE ED TODAY FOR EVAL OF POSSIBLE MEDICATION OD.  PT'S HOME HEALTH NURSE NOTICED THAT PT MAY HAVE TAKEN EXTRA LASIX.  PT SAID THAT SHE FEELS OK, BUT HAS BEEN URINATING MORE THAN NL. INITIAL BP WAS 80/60 WHEN EMS ARRIVED.  (Consider location/radiation/quality/duration/timing/severity/associated sxs/prior Treatment) Patient is a 70 y.o. female presenting with Overdose. The history is provided by the patient and the EMS personnel.  Drug Overdose This is a new problem. The current episode started 1 to 2 hours ago. The problem has been rapidly improving.    Past Medical History  Diagnosis Date  . Diabetes mellitus   . COPD (chronic obstructive pulmonary disease) ([place])     sees Dr. [name]  . Sleep apnea, obstructive   . Pulmonary HTN ([place])   . Obesity (BMI 30.0-34.9)   . Hyperlipidemia   . Myocardial infarction ([place])   . Coronary artery disease   . Anxiety   . Shortness of breath   . GERD (gastroesophageal reflux disease)   . Arthritis     rheumatoid, sees Dr. Tobie Lords  . CHF (congestive heart failure) ([place])   . Pneumonia   . Bronchitis     hx of   . History of hiatal hernia   . Acute on chronic diastolic (congestive) heart failure ([place]) 09/07/2015  . Aortic stenosis 01/14/2012  . Pleural effusion on right 09/07/2015  . Tricuspid regurgitation   . Right-sided congestive heart failure ([place])   . Mitral regurgitation    Past Surgical History  Procedure Laterality Date  . Appendectomy    . Tonsillectomy and adenoidectomy    . Lumbar disc surgery    . Hemorrhoid surgery    . Coronary angioplasty    . Endarterectomy Left 12/04/2012    Procedure: ENDARTERECTOMY CAROTID;  Surgeon: Mal Misty, MD;  Location: [facility];  Service: Vascular;  Laterality: Left;  . Patch angioplasty  Left 12/04/2012    Procedure: PATCH ANGIOPLASTY;  Surgeon: Mal Misty, MD;  Location: [facility];  Service: Vascular;  Laterality: Left;  Marland Kitchen Eye surgery      bilat cataract surg   . Colonscopy     . Total hip arthroplasty Left 07/30/2014    Procedure: LEFT TOTAL HIP ARTHROPLASTY ANTERIOR APPROACH;  Surgeon: Meredith Pel, MD;  Location: [facility];  Service: Orthopedics;  Laterality: Left;  . Tee without cardioversion N/A 09/21/2015    Procedure: TRANSESOPHAGEAL ECHOCARDIOGRAM (TEE);  Surgeon: Larey Dresser, MD;  Location: [facility] ENDOSCOPY;  Service: Cardiovascular;  Laterality: N/A;   Family History  Problem Relation Age of Onset  . Hypertension Mother   . Hyperlipidemia Mother   . Stroke Mother   . Heart attack Mother   . Cancer Mother   . Heart attack Father   . Cancer Brother   . Asthma Maternal Grandfather    Social History  Substance Use Topics  . Smoking status: Former Smoker -- 2.00 packs/day for 36 years    Types: Cigarettes    Quit date: 02/02/2013  . Smokeless tobacco: Never Used     Comment: had restarted and quit again 01/27/12   . Alcohol Use: 0.0 oz/week    0 Standard drinks or equivalent per week     Comment: rare  OB History    No data available     Review of Systems  Endocrine: Positive for polyuria.  All other systems reviewed and are negative.     Allergies  Codeine  Home Medications   Prior to Admission medications   Medication Sig Start Date End Date Taking? Authorizing Provider  albuterol (PROVENTIL HFA;VENTOLIN HFA) 108 (90 BASE) MCG/ACT inhaler Inhale 2 puffs into the lungs every 6 (six) hours as needed for wheezing or shortness of breath.    Historical Provider, MD  ALPRAZolam Duanne Moron) 0.5 MG tablet Take 0.5 mg by mouth every 8 (eight) hours as needed for anxiety.    Historical Provider, MD  aspirin 81 MG chewable tablet Chew 1 tablet (81 mg total) by mouth daily. 09/28/15   Mir Marry Guan, MD  atorvastatin (LIPITOR) 20 MG tablet Take 1  tablet (20 mg total) by mouth daily. 12/07/14   [name], MD  budesonide-formoterol [facility]) 160-4.5 MCG/ACT inhaler Inhale 2 puffs into the lungs 2 (two) times daily. 10/26/13   Tammy S Parrett, NP  cephALEXin (KEFLEX) 500 MG capsule Take 1 capsule (500 mg total) by mouth 4 (four) times daily. 10/17/15   Isla Pence, MD  DULoxetine (CYMBALTA) 60 MG capsule Take 1 capsule (60 mg total) by mouth daily. 12/07/14   [name], MD  ferrous sulfate 325 (65 FE) MG tablet Take 1 tablet (325 mg total) by mouth 2 (two) times daily with a meal. 09/28/15   Mir Marry Guan, MD  gabapentin (NEURONTIN) 400 MG capsule Take 400 mg by mouth 3 (three) times daily.    Historical Provider, MD  glipiZIDE (GLUCOTROL) 10 MG tablet TAKE 1 TABLET (10 MG TOTAL) BY MOUTH 2 (TWO) TIMES DAILY BEFORE A MEAL. 06/20/14   [name], MD  metFORMIN (GLUCOPHAGE) 500 MG tablet Take 1 tablet (500 mg total) by mouth 3 (three) times daily. 12/07/14   [name], MD  omeprazole (PRILOSEC) 40 MG capsule Take 1 capsule (40 mg total) by mouth daily before breakfast. 12/07/14   [name], MD  potassium chloride SA (K-DUR,KLOR-CON) 20 MEQ tablet Take 1 tablet (20 mEq total) by mouth daily. 09/28/15   Mir Marry Guan, MD  sitaGLIPtin (JANUVIA) 100 MG tablet Take 1 tablet (100 mg total) by mouth daily. 06/04/14   [name], MD  solifenacin (VESICARE) 10 MG tablet Take 1 tablet (10 mg total) by mouth daily. 06/04/14   [name], MD  spironolactone (ALDACTONE) 25 MG tablet Take 1 tablet (25 mg total) by mouth every morning. 12/07/14   [name], MD  torsemide (DEMADEX) 20 MG tablet Take 3 tablets (60 mg total) by mouth daily. 10/11/15   Shirley Friar, PA-C  traMADol (ULTRAM) 50 MG tablet Take 1 tablet (50 mg total) by mouth every 12 (twelve) hours as needed for severe pain. Patient taking differently: Take 50 mg by mouth every 12 (twelve) hours as needed for severe pain. #30, Rosiland Oz 09/28/15   Mir Marry Guan, MD  vitamin B-12 (CYANOCOBALAMIN) 1000 MCG tablet Take 1 tablet (1,000 mcg total) by mouth daily. 11/19/14   Delfina Redwood, MD   BP 109/73 mmHg  Pulse 97  Temp(Src) 98.4 F (36.9 C) (Oral)  Resp 16  SpO2 97% Physical Exam  Constitutional: She is oriented to person, place, and time. She appears well-developed and well-nourished.  HENT:  Head: Normocephalic and atraumatic.  Right Ear: External ear normal.  Left Ear: External ear normal.  Nose: Nose normal.  Mouth/Throat: Oropharynx is clear and moist.  Eyes: Conjunctivae and EOM are normal. Pupils are equal, round, and reactive to light.  Neck: Normal range of motion. Neck supple.  Cardiovascular: Normal rate, regular rhythm, normal heart sounds and intact distal pulses.   Pulmonary/Chest: Effort normal and breath sounds normal.  Abdominal: Soft. Bowel sounds are normal.  Musculoskeletal: Normal range of motion.  Neurological: She is alert and oriented to person, place, and time.  Skin: Skin is warm and dry.  Psychiatric: She has a normal mood and affect. Her behavior is normal. Judgment and thought content normal.  Nursing note and vitals reviewed.   ED Course  Procedures (including critical care time) Labs Review Labs Reviewed  CBC WITH DIFFERENTIAL/PLATELET - Abnormal; Notable for the following:    Hemoglobin 10.3 (*)    HCT 34.9 (*)    MCH 24.0 (*)    MCHC 29.5 (*)    RDW 22.1 (*)    All other components within normal limits  COMPREHENSIVE METABOLIC PANEL - Abnormal; Notable for the following:    Sodium 134 (*)    Chloride 89 (*)    CO2 35 (*)    Glucose, Bld 122 (*)    BUN 45 (*)    Creatinine, Ser 1.49 (*)    GFR calc non Af Amer 34 (*)    GFR calc Af Amer 40 (*)    All other components within normal limits  URINALYSIS, ROUTINE W REFLEX MICROSCOPIC (NOT AT [facility]) - Abnormal; Notable for the following:    APPearance CLOUDY (*)    Hgb urine dipstick SMALL (*)    Leukocytes, UA LARGE (*)    All  other components within normal limits  URINE MICROSCOPIC-ADD ON - Abnormal; Notable for the following:    Squamous Epithelial / LPF 6-30 (*)    Bacteria, UA MANY (*)    All other components within normal limits  I-STAT ARTERIAL BLOOD GAS, ED - Abnormal; Notable for the following:    pCO2 arterial 54.1 (*)    pO2, Arterial 72.0 (*)    Bicarbonate 36.7 (*)    Acid-Base Excess 11.0 (*)    All other components within normal limits  URINE CULTURE  URINE RAPID DRUG SCREEN, HOSP PERFORMED    Imaging Review Dg Chest 2 View  10/17/2015  CLINICAL DATA:  Altered mental status. EXAM: CHEST  2 VIEW COMPARISON:  September 25, 2015. FINDINGS: Stable cardiomediastinal silhouette is noted. No pneumothorax or pleural effusion is noted. No acute pulmonary disease is noted. Bony thorax is unremarkable. IMPRESSION: No active cardiopulmonary disease. Electronically Signed   By: Marijo Conception, M.D.   On: 10/17/2015 18:36   Ct Head Wo Contrast  10/17/2015  CLINICAL DATA:  Altered mental status. Possible medication overdose. EXAM: CT HEAD WITHOUT CONTRAST TECHNIQUE: Contiguous axial images were obtained from the base of the skull through the vertex without intravenous contrast. COMPARISON:  11/17/2014 FINDINGS: Ventricles, cisterns and other CSF spaces are within normal. Minimal chronic ischemic microvascular disease. No evidence of mass, mass effect, shift of midline structures or acute hemorrhage. No evidence of acute infarction. Remaining bones and soft tissues are within normal. IMPRESSION: No acute intracranial findings. Mild chronic ischemic microvascular disease. Electronically Signed   By: Marin Olp M.D.   On: 10/17/2015 19:04   I have personally reviewed and evaluated these images and lab results as part of my medical decision-making.   EKG Interpretation   Date/Time:  Monday Oct 17 2015  17:35:33 EDT Ventricular Rate:  106 PR Interval:  150 QRS Duration: 155 QT Interval:  357 QTC Calculation:  474 R Axis:   76 Text Interpretation:  Sinus tachycardia Right bundle branch block has not  changed Confirmed by Riyan Haile MD, Kinleigh Nault ([id number]) on 10/17/2015 5:38:12 PM      MDM  PT HAS IMPROVED.  SHE WILL BE D/C'D HOME ON KEFLEX. Final diagnoses:  UTI (lower urinary tract infection)       Isla Pence, MD 10/18/15 [phone]

## 2015-10-18 DIAGNOSIS — N390 Urinary tract infection, site not specified: Secondary | ICD-10-CM | POA: Diagnosis not present

## 2015-10-18 NOTE — ED Notes (Signed)
[facility] called to transport pt home due to being on continuous 02

## 2015-10-20 ENCOUNTER — Ambulatory Visit (INDEPENDENT_AMBULATORY_CARE_PROVIDER_SITE_OTHER): Payer: Medicare Other | Admitting: Family Medicine

## 2015-10-20 ENCOUNTER — Encounter: Payer: Self-pay | Admitting: Family Medicine

## 2015-10-20 ENCOUNTER — Telehealth: Payer: Self-pay | Admitting: Family Medicine

## 2015-10-20 VITALS — BP 101/69 | HR 109 | Temp 98.0°F | Ht 68.0 in | Wt 170.0 lb

## 2015-10-20 DIAGNOSIS — R911 Solitary pulmonary nodule: Secondary | ICD-10-CM

## 2015-10-20 DIAGNOSIS — I6529 Occlusion and stenosis of unspecified carotid artery: Secondary | ICD-10-CM | POA: Diagnosis not present

## 2015-10-20 DIAGNOSIS — I10 Essential (primary) hypertension: Secondary | ICD-10-CM

## 2015-10-20 DIAGNOSIS — I5033 Acute on chronic diastolic (congestive) heart failure: Secondary | ICD-10-CM

## 2015-10-20 DIAGNOSIS — E1122 Type 2 diabetes mellitus with diabetic chronic kidney disease: Secondary | ICD-10-CM | POA: Diagnosis not present

## 2015-10-20 DIAGNOSIS — I350 Nonrheumatic aortic (valve) stenosis: Secondary | ICD-10-CM

## 2015-10-20 DIAGNOSIS — J449 Chronic obstructive pulmonary disease, unspecified: Secondary | ICD-10-CM

## 2015-10-20 DIAGNOSIS — N182 Chronic kidney disease, stage 2 (mild): Secondary | ICD-10-CM

## 2015-10-20 LAB — URINE CULTURE: Special Requests: NORMAL

## 2015-10-20 MED ORDER — TRAMADOL HCL 50 MG PO TABS: 50.0000 mg | ORAL_TABLET | Freq: Four times a day (QID) | ORAL | Status: AC | PRN

## 2015-10-20 NOTE — Telephone Encounter (Signed)
Estill Bamberg from [facility] [phone]) called to make sure that the patient came to her appointment today and was wondering if she seemed to disoriented when she was here. I told her that I didn't noticed anything off with her today and I suggested that maybe the CMA would know more about how she was acting today.

## 2015-10-20 NOTE — Progress Notes (Signed)
   Subjective:    Patient ID: Jillian Bright, female    DOB: [birth year], 70 y.o.   MRN: [id number]  HPI Here to follow up. She was hospitalized from 08-28-15 to 09-28-15 for exacerbations of both COPD and heart failure. From there she went to [facility], and she has been back at home for the past 3 weeks. She has improved quite a bit. She has lost weight and is breathing a bit more easily. Her appetite is poor but she tries to get some nutrition in. Her leg edema has improved. She is getting PT and OT at home. She went to the ER on 10-17-15 and was diagnosed with a UTI. The culture grew a pan-sensitive E coli, and she is taking Keflex. Her A1c on 09-08-15 was 7.3. She saw Joesph July PA in the Cardiology clinic on 10-11-15 and she seems to be stable from a cardiac standpoint. She has a 1.3 cm nodule in the left lung, and she was to have seen Dr. Halford Chessman for this. However she forgot and missed her appointment.    Review of Systems  Constitutional: Negative.   HENT: Negative.   Eyes: Negative.   Respiratory: Positive for shortness of breath. Negative for cough.   Cardiovascular: Positive for leg swelling. Negative for chest pain and palpitations.  Neurological: Negative.        Objective:   Physical Exam  Constitutional: She is oriented to person, place, and time. She appears well-developed and well-nourished.  She looks good today. Wearing [place] oxygen.   Neck: No thyromegaly present.  Cardiovascular: Normal rate, regular rhythm, normal heart sounds and intact distal pulses.   Pulmonary/Chest: Effort normal and breath sounds normal. No respiratory distress. She has no wheezes. She has no rales.  Lymphadenopathy:    She has no cervical adenopathy.  Neurological: She is alert and oriented to person, place, and time.          Assessment & Plan:  Her COPD and CHF are stable. Coronary artery disease is stable. She will finish out the Keflex for the UTI. Her diabetes is fairly well controlled.  I reminded her to make another follow up appointment soon with Dr. Halford Chessman.  [name], MD

## 2015-10-20 NOTE — Telephone Encounter (Signed)
Jillian Bright from [place] was wondering about how she was today at her visit because she has noticed that she has been mixing her medications and might be takeing the wrong ones or wrong amount, at the wrong time. Jillian Bright feels like she shouldn't be living on her own.

## 2015-10-20 NOTE — Progress Notes (Signed)
Pre visit review using our clinic review tool, if applicable. No additional management support is needed unless otherwise documented below in the visit note. 

## 2015-10-21 ENCOUNTER — Telehealth: Payer: Self-pay | Admitting: *Deleted

## 2015-10-21 NOTE — Telephone Encounter (Signed)
Her behavior seemed appropriate to me yesterday. Let us know if they see other changes

## 2015-10-21 NOTE — ED Notes (Signed)
Post ED Visit - Positive Culture Follow-up  Culture report reviewed by antimicrobial stewardship pharmacist:  '[]'$  Elenor Quinones, Pharm.D. '[]'$  Heide Guile, Pharm.D., BCPS '[]'$  Parks Neptune, Pharm.D. '[]'$  Alycia Rossetti, Pharm.D., BCPS '[]'$  [place], Pharm.D., BCPS, AAHIVP '[]'$  Legrand Como, Pharm.D., BCPS, AAHIVP '[x]'$  Milus Glazier, Pharm.D. '[]'$  Rob [place], Pharm.D.  Positive urine culture Treated with Cephalexin, organism sensitive to the same and no further patient follow-up is required at this time.  Harlon Flor [facility] 10/21/2015, 1:39 PM

## 2015-10-21 NOTE — Telephone Encounter (Signed)
I spoke with Estill Bamberg and she would like to request a social work visit to evaluate pt, explained that you were out of the office until Tuesday 10/25/2015 and would review this at that time.

## 2015-10-25 ENCOUNTER — Ambulatory Visit: Payer: Medicare Other | Admitting: Adult Health

## 2015-10-25 ENCOUNTER — Inpatient Hospital Stay (HOSPITAL_COMMUNITY): Admission: RE | Admit: 2015-10-25 | Payer: Medicare Other | Source: Ambulatory Visit

## 2015-10-25 ENCOUNTER — Telehealth: Payer: Self-pay | Admitting: Family Medicine

## 2015-10-25 NOTE — Telephone Encounter (Signed)
Will route to PCP as Juluis Rainier

## 2015-10-25 NOTE — Telephone Encounter (Signed)
I left a voice message for Estill Bamberg with below verbal order.

## 2015-10-25 NOTE — Telephone Encounter (Signed)
noted 

## 2015-10-25 NOTE — Telephone Encounter (Signed)
Please call in a Social Work consult to evaluate whether she can safely live alone in assisted living (manage meds, etc)

## 2015-10-25 NOTE — Telephone Encounter (Signed)
Jillian Bright is calling to report on 10-17-15  Pt pulse was114  And on  5-25  Pt pulse was 104 . Please advise

## 2015-10-26 ENCOUNTER — Ambulatory Visit (INDEPENDENT_AMBULATORY_CARE_PROVIDER_SITE_OTHER): Payer: Medicare Other | Admitting: Adult Health

## 2015-10-26 ENCOUNTER — Encounter: Payer: Self-pay | Admitting: Adult Health

## 2015-10-26 VITALS — BP 100/62 | HR 96 | Temp 98.7°F | Ht 66.0 in | Wt 173.0 lb

## 2015-10-26 DIAGNOSIS — E662 Morbid (severe) obesity with alveolar hypoventilation: Secondary | ICD-10-CM

## 2015-10-26 DIAGNOSIS — R911 Solitary pulmonary nodule: Secondary | ICD-10-CM

## 2015-10-26 DIAGNOSIS — I6529 Occlusion and stenosis of unspecified carotid artery: Secondary | ICD-10-CM

## 2015-10-26 DIAGNOSIS — J449 Chronic obstructive pulmonary disease, unspecified: Secondary | ICD-10-CM | POA: Diagnosis not present

## 2015-10-26 NOTE — Progress Notes (Signed)
Subjective:    Patient ID: Jillian Bright, female    DOB: [birth year], 70 y.o.   MRN: [id number]  HPI 70 yo female former smoker with COPD, and OSA/OHS unable to tolerate CPAP.  10/26/2015 [facility] follow up Patient presents for a post hospital follow-up.  Patient had a recent  prolonged hospitalization for decompensated, acute on chronic diastolic congestive heart failure. Patient required aggressive diuresis. Patient had a right-sided pleural effusion that required thoracentesis. Cytology was negative.  TEE showed moderate to severe aortic valve stenosis., EF of 45-50%, diffuse hypokinesis. Right ventricle was severely dilated, right atrium, moderately to  severely dilated   Cardiology evaluation felt patient was not an ideal candidate for replacement. CT chest showed a left upper lobe pulmonary nodule that had enlarged.  She's been recommended for an outpatient PET scan.  Denies chest pain, orthopnea, increased edema or fever.  Since discharge. Patient says she is feeling better. She is remains very weak but dyspnea, seems to be decreased.  Remains on Symbicort Twice daily   PVX is utd. Unsure about prevnar.   Tests: PFT 06/13/09>>FEV1 1.97(76%), FEV1% 73, TLC 5.35(101%), DLCO 65% ABG 06/13/09 >>7.44/50.6/55 on room air  PSG 06/19/09 >>AHI 13  PFT 12/25/11>>FEV1 1.17 (52%), FEV1% 58, TLC 3.67 (70%), DLCO 50%, +BD CT chest 12/31/11>>emphysema, 1.5 cm precarinal node CT cervical spine 01/03/12>>11 x 8 mm LUL nodule Echo 02/02/12>>EF 60 to 50%, grade 1 diastolic dysfx, mild AS, mod RV systolic dysfx, PAS 32 mmHg CT chest 06/30/12 >> 1.3 cm precarinal node no change Echo 12/02/12 >> EF 55 to 53%, grade 1 diastolic dysfx, mild/mod AS    Review of Systems Constitutional:   No  weight loss, night sweats,  Fevers, chills,  +fatigue, or  lassitude.  HEENT:   No headaches,  Difficulty swallowing,  Tooth/dental problems, or  Sore throat,                No sneezing, itching, ear  ache, nasal congestion, post nasal drip,   CV:  No chest pain,  Orthopnea, PND, anasarca, dizziness, palpitations, syncope.   GI  No heartburn, indigestion, abdominal pain, nausea, vomiting, diarrhea, change in bowel habits, loss of appetite, bloody stools.   Resp:  No excess mucus, no productive cough,  No non-productive cough,  No coughing up of blood.  No change in color of mucus.  No wheezing.  No chest wall deformity  Skin: no rash or lesions.  GU: no dysuria, change in color of urine, no urgency or frequency.  No flank pain, no hematuria   MS:  No joint pain or swelling.  No decreased range of motion.  No back pain.  Psych:  No change in mood or affect. No depression or anxiety.  No memory loss.         Objective:   Physical Exam   Filed Vitals:   10/26/15 1230  BP: 100/62  Pulse: 96  Temp: 98.7 F (37.1 C)  TempSrc: Oral  Height: '5\' 6"'$  (1.676 m)  Weight: 173 lb (78.472 kg)  SpO2: 92%    GEN: A/Ox3; pleasant , NAD, elderly   HEENT:  [place]/AT,  EACs-clear, TMs-wnl, NOSE-clear, THROAT-clear, no lesions, no postnasal drip or exudate noted.   NECK:  Supple w/ fair ROM; no JVD; normal carotid impulses w/o bruits; no thyromegaly or nodules palpated; no lymphadenopathy.  RESP  Decreased BS in bases w/o, wheezes/ rales/ or rhonchi.no accessory muscle use, no dullness to percussion  CARD:  RRR,  Gr  2/6 SM  , tr  peripheral edema, VI changes ,  pulses intact, no cyanosis or clubbing.  GI:   Soft & nt; nml bowel sounds; no organomegaly or masses detected.  Musco: Warm bil, no deformities or joint swelling noted.   Neuro: alert, no focal deficits noted.    Skin: Warm, no lesions or rashes   Jood Retana NP-C   Pulmonary and Critical Care  10/26/2015

## 2015-10-26 NOTE — Patient Instructions (Addendum)
Continue on current regimen .  Continue on Symbicort 2 puffs Twice daily  , rinse after use.  We will set up PET scan.  Check with your primary MD or [facility]  to see if you have had a Prevnar vaccine.   Wear oxygen 3l/m daytime , 4l/m at bedtime -at all times.  follow up Dr. Halford Chessman  In 2-3 months  and As needed   Please contact office for sooner follow up if symptoms do not improve or worsen or seek emergency care

## 2015-10-31 DIAGNOSIS — I5033 Acute on chronic diastolic (congestive) heart failure: Secondary | ICD-10-CM | POA: Diagnosis not present

## 2015-10-31 DIAGNOSIS — E1122 Type 2 diabetes mellitus with diabetic chronic kidney disease: Secondary | ICD-10-CM | POA: Diagnosis not present

## 2015-10-31 DIAGNOSIS — N182 Chronic kidney disease, stage 2 (mild): Secondary | ICD-10-CM | POA: Diagnosis not present

## 2015-10-31 DIAGNOSIS — I130 Hypertensive heart and chronic kidney disease with heart failure and stage 1 through stage 4 chronic kidney disease, or unspecified chronic kidney disease: Secondary | ICD-10-CM

## 2015-10-31 NOTE — Assessment & Plan Note (Signed)
CT chest with a left upper lobe pulmonary nodule that is enlarging. Set up for PET scan

## 2015-10-31 NOTE — Assessment & Plan Note (Signed)
Wear oxygen 3l/m daytime , 4l/m at bedtime -at all times.  follow up Dr. Halford Chessman  In 2-3 months  and As needed   Please contact office for sooner follow up if symptoms do not improve or worsen or seek emergency care

## 2015-10-31 NOTE — Assessment & Plan Note (Signed)
Continue on current regimen .  Continue on Symbicort 2 puffs Twice daily  , rinse after use.  We will set up PET scan.  Check with your primary MD or [facility]  to see if you have had a Prevnar vaccine.   Wear oxygen 3l/m daytime , 4l/m at bedtime -at all times.  follow up Dr. Halford Chessman  In 2-3 months  and As needed   Please contact office for sooner follow up if symptoms do not improve or worsen or seek emergency care

## 2015-10-31 NOTE — Progress Notes (Signed)
Reviewed and agree with assessment/plan.  [name], MD [facility] Pulmonary/Critical Care 10/31/2015, 12:37 PM Pager:  [phone]

## 2015-11-03 ENCOUNTER — Other Ambulatory Visit: Payer: Self-pay | Admitting: Family Medicine

## 2015-11-04 ENCOUNTER — Telehealth: Payer: Self-pay | Admitting: Family Medicine

## 2015-11-04 NOTE — Telephone Encounter (Signed)
I called the pts pharmacy and per Lanelle Bal the Rx has been filled for Gabapentin '300mg'$  but the pt asked for Gabapentin '800mg'$  with her last refill?

## 2015-11-04 NOTE — Telephone Encounter (Signed)
Please approve the additional visit

## 2015-11-04 NOTE — Telephone Encounter (Signed)
Spoke to [employer]. Confirmed verbal order.

## 2015-11-04 NOTE — Telephone Encounter (Signed)
Please call her pharmacy to see what the current dosage on her Gabapentin is

## 2015-11-04 NOTE — Telephone Encounter (Signed)
Geni Bers with [facility] home [facility] did a needs assessment yesterday and identified a number of patient needs.    Geni Bers would like a verbal to return one time next week (Monday) to review community resources,   educational material, and some things pt will need to follow up on her on.  Geni Bers is going on a leave of absence next week and needs to see pt on Monday, June 12. So she would appreciate this verbal today.

## 2015-11-08 ENCOUNTER — Ambulatory Visit (HOSPITAL_COMMUNITY)
Admission: RE | Admit: 2015-11-08 | Discharge: 2015-11-08 | Disposition: A | Discharge status: Home / Self Care | Payer: Medicare Other | Source: Ambulatory Visit | Attending: Adult Health | Admitting: Adult Health

## 2015-11-08 ENCOUNTER — Telehealth: Payer: Self-pay | Admitting: Family Medicine

## 2015-11-08 DIAGNOSIS — I700 Atherosclerosis of aorta: Secondary | ICD-10-CM | POA: Insufficient documentation

## 2015-11-08 DIAGNOSIS — R911 Solitary pulmonary nodule: Secondary | ICD-10-CM

## 2015-11-08 DIAGNOSIS — R938 Abnormal findings on diagnostic imaging of other specified body structures: Secondary | ICD-10-CM | POA: Insufficient documentation

## 2015-11-08 DIAGNOSIS — R590 Localized enlarged lymph nodes: Secondary | ICD-10-CM | POA: Diagnosis not present

## 2015-11-08 LAB — GLUCOSE, CAPILLARY: Glucose-Capillary: 139 mg/dL — ABNORMAL HIGH (ref 65–99)

## 2015-11-08 MED ORDER — FLUDEOXYGLUCOSE F - 18 (FDG) INJECTION
8.5000 | Freq: Once | INTRAVENOUS | Status: AC | PRN
  Administered 2015-11-08: 8.5 via INTRAVENOUS

## 2015-11-08 NOTE — Telephone Encounter (Signed)
Geni Bers from [place] was not able to do the fup she requested for pt because she was unable to reach her over the the weekend. She said she will follow up with pt by phone since pt had a doctors appt today.  She said shee does need a call as she is required to let us knwo that she was not able to see pt.     [phone]

## 2015-11-09 NOTE — Telephone Encounter (Signed)
Will route to Dr. Sarajane Jews as Juluis Rainier.

## 2015-11-09 NOTE — Telephone Encounter (Signed)
It was noted that she could not meet the patient.

## 2015-11-10 ENCOUNTER — Other Ambulatory Visit: Payer: Self-pay | Admitting: Adult Health

## 2015-11-10 ENCOUNTER — Telehealth: Payer: Self-pay | Admitting: *Deleted

## 2015-11-10 DIAGNOSIS — R911 Solitary pulmonary nodule: Secondary | ICD-10-CM

## 2015-11-10 NOTE — Progress Notes (Signed)
Quick Note:  Order has been placed for [place] referral. Will hold until appointment has been scheduled. ______

## 2015-11-10 NOTE — Telephone Encounter (Signed)
Oncology Nurse Navigator Documentation  Oncology Nurse Navigator Flowsheets 11/10/2015  Navigator Encounter Type Telephone;Introductory phone call  Telephone Outgoing Call  Treatment Phase Pre-Tx/Tx Discussion  Barriers/Navigation Needs Coordination of Care  Interventions Coordination of Care  Coordination of Care Appts  Acuity Level 1  Acuity Level 1 Initial guidance, education and coordination as needed  Time Spent with Patient 15   I received referral on Ms. Jillian Bright today.  I called and spoke with her.  She is scheduled for [place] [facility] on 11/17/15 arrive at 1:30.  She verbalized understand of appt time and place.

## 2015-11-11 ENCOUNTER — Telehealth: Payer: Self-pay | Admitting: *Deleted

## 2015-11-11 ENCOUNTER — Other Ambulatory Visit: Payer: Self-pay | Admitting: Internal Medicine

## 2015-11-11 NOTE — Telephone Encounter (Signed)
Mailed new pt letter to pt.

## 2015-11-11 NOTE — Progress Notes (Signed)
Quick Note:  Scheduled for [place] on 11/17/15 ______

## 2015-11-15 ENCOUNTER — Other Ambulatory Visit: Payer: Self-pay | Admitting: Family Medicine

## 2015-11-16 ENCOUNTER — Telehealth: Payer: Self-pay | Admitting: *Deleted

## 2015-11-16 NOTE — Telephone Encounter (Signed)
Left a friendly reminder message about pt's clinic appt for 11/17/15 on pt's voicemail.

## 2015-11-17 ENCOUNTER — Ambulatory Visit: Payer: Medicare Other | Attending: Internal Medicine | Admitting: Physical Therapy

## 2015-11-17 ENCOUNTER — Ambulatory Visit
Admission: RE | Admit: 2015-11-17 | Discharge: 2015-11-17 | Disposition: A | Discharge status: Home / Self Care | Payer: Medicare Other | Source: Ambulatory Visit | Attending: Radiation Oncology | Admitting: Radiation Oncology

## 2015-11-17 ENCOUNTER — Ambulatory Visit (HOSPITAL_BASED_OUTPATIENT_CLINIC_OR_DEPARTMENT_OTHER): Payer: Medicare Other | Admitting: Internal Medicine

## 2015-11-17 ENCOUNTER — Other Ambulatory Visit (HOSPITAL_BASED_OUTPATIENT_CLINIC_OR_DEPARTMENT_OTHER): Payer: Medicare Other

## 2015-11-17 ENCOUNTER — Encounter: Payer: Self-pay | Admitting: Internal Medicine

## 2015-11-17 VITALS — BP 101/66 | HR 102 | Temp 98.1°F | Resp 18 | Ht 66.0 in | Wt 174.7 lb

## 2015-11-17 DIAGNOSIS — R911 Solitary pulmonary nodule: Secondary | ICD-10-CM

## 2015-11-17 DIAGNOSIS — R599 Enlarged lymph nodes, unspecified: Secondary | ICD-10-CM | POA: Diagnosis not present

## 2015-11-17 DIAGNOSIS — R600 Localized edema: Secondary | ICD-10-CM | POA: Insufficient documentation

## 2015-11-17 DIAGNOSIS — C3412 Malignant neoplasm of upper lobe, left bronchus or lung: Secondary | ICD-10-CM

## 2015-11-17 DIAGNOSIS — R262 Difficulty in walking, not elsewhere classified: Secondary | ICD-10-CM | POA: Diagnosis not present

## 2015-11-17 DIAGNOSIS — M545 Low back pain, unspecified: Secondary | ICD-10-CM

## 2015-11-17 DIAGNOSIS — R300 Dysuria: Secondary | ICD-10-CM

## 2015-11-17 DIAGNOSIS — M6281 Muscle weakness (generalized): Secondary | ICD-10-CM | POA: Diagnosis present

## 2015-11-17 DIAGNOSIS — C3492 Malignant neoplasm of unspecified part of left bronchus or lung: Secondary | ICD-10-CM

## 2015-11-17 DIAGNOSIS — R590 Localized enlarged lymph nodes: Secondary | ICD-10-CM | POA: Diagnosis not present

## 2015-11-17 HISTORY — DX: Localized enlarged lymph nodes: R59.0

## 2015-11-17 LAB — CBC WITH DIFFERENTIAL/PLATELET
BASO%: 0.1 % (ref 0.0–2.0)
Basophils Absolute: 0 10*3/uL (ref 0.0–0.1)
EOS ABS: 0.3 10*3/uL (ref 0.0–0.5)
EOS%: 3.5 % (ref 0.0–7.0)
HEMATOCRIT: 42.4 % (ref 34.8–46.6)
HGB: 12.8 g/dL (ref 11.6–15.9)
LYMPH%: 28.3 % (ref 14.0–49.7)
MCH: 26.2 pg (ref 25.1–34.0)
MCHC: 30.2 g/dL — ABNORMAL LOW (ref 31.5–36.0)
MCV: 86.9 fL (ref 79.5–101.0)
MONO#: 0.4 10*3/uL (ref 0.1–0.9)
MONO%: 5.6 % (ref 0.0–14.0)
NEUT%: 62.5 % (ref 38.4–76.8)
NEUTROS ABS: 4.8 10*3/uL (ref 1.5–6.5)
NRBC: 0 % (ref 0–0)
PLATELETS: 209 10*3/uL (ref 145–400)
RBC: 4.88 10*6/uL (ref 3.70–5.45)
RDW: 18.8 % — AB (ref 11.2–14.5)
WBC: 7.6 10*3/uL (ref 3.9–10.3)
lymph#: 2.2 10*3/uL (ref 0.9–3.3)

## 2015-11-17 LAB — COMPREHENSIVE METABOLIC PANEL
ALBUMIN: 3.4 g/dL — AB (ref 3.5–5.0)
ALK PHOS: 74 U/L (ref 40–150)
ALT: 10 U/L (ref 0–55)
ANION GAP: 7 meq/L (ref 3–11)
AST: 15 U/L (ref 5–34)
BILIRUBIN TOTAL: 0.41 mg/dL (ref 0.20–1.20)
BUN: 25.6 mg/dL (ref 7.0–26.0)
CO2: 30 mEq/L — ABNORMAL HIGH (ref 22–29)
Calcium: 9.5 mg/dL (ref 8.4–10.4)
Chloride: 100 mEq/L (ref 98–109)
Creatinine: 0.9 mg/dL (ref 0.6–1.1)
EGFR: 68 mL/min/{1.73_m2} — AB (ref >90)
GLUCOSE: 76 mg/dL (ref 70–140)
POTASSIUM: 5.1 meq/L (ref 3.5–5.1)
SODIUM: 137 meq/L (ref 136–145)
TOTAL PROTEIN: 7.5 g/dL (ref 6.4–8.3)

## 2015-11-17 LAB — URINALYSIS, MICROSCOPIC - CHCC
BLOOD: NEGATIVE
Bilirubin (Urine): NEGATIVE
Glucose: NEGATIVE mg/dL
Ketones: NEGATIVE mg/dL
Nitrite: NEGATIVE
PH: 5 (ref 4.6–8.0)
PROTEIN: NEGATIVE mg/dL
SPECIFIC GRAVITY, URINE: 1.01 (ref 1.003–1.035)
UROBILINOGEN UR: 0.2 mg/dL (ref 0.2–1)

## 2015-11-17 NOTE — Progress Notes (Signed)
[place] [phone]   [phone] Multidisciplinary thoracic oncology clinic  CONSULT NOTE  REFERRING PHYSICIAN: Dr. [name]  REASON FOR CONSULTATION:  70 years old white female with suspicious lung cancer.  HPI Jillian Bright is a 70 y.o. female was past medical history significant for multiple medical problems including history of hypertension, pulmonary hypertension, congestive heart failure, coronary artery disease status post myocardial infarction in 2002, COPD, GERD, degenerative disc disease, rheumatoid arthritis, aortic stenosis, diabetes mellitus, fibromyalgia as well as deficiency anemia. The patient also has history of heavy smoking but quit 6 years ago. She has a known history of left upper lobe pulmonary nodule since CT angiogram of the chest performed on 01/29/2014. This was followed closely by her primary care physician. Repeat CT angiogram of the chest on 09/13/2015 showed enlarging left upper lobe nodule measured 1.5 x 1.1 x 0.9 cm. The patient was seen by Dr. Halford Chessman and there was concern that she may not be able to tolerate sedation for bronchoscopy and the nodule was also of central location in the left upper lobe and there was a concern about CT-guided biopsy. She had a PET scan performed on 11/11/2015 and it showed 1.3 cm left upper lobe pulmonary nodule was hypermetabolic with SUV max of 4.2. There was also a 0.7 cm short axis left axillary lymph node with mild hypermetabolism in an SUV max of 1.9. The scan also showed 0.7 cm right pelvic sidewall lymph node hypermetabolic with SUV max of 5.2 and there was also associated 1.0 cm short axis hypermetabolic right common femoral lymph node and 1.2 cm short axis right groin lymph node with SUV max of 4.9. Dr. Halford Chessman kindly referred the patient to the multidisciplinary thoracic oncology clinic today for evaluation and recommendation regarding her condition. CT scan of the head without contrast  on 10/17/2015 showed no evidence of metastatic disease to the brain. When seen today the patient is feeling fine with no specific complaints except for arthritis of the lower back as well as the right knee. She has shortness breath with exertion and currently on home oxygen. She denied having any chest pain but has mild cough productive of whitish sputum with no hemoptysis. She denied having any significant weight loss or night sweats. She has no headache or visual changes. Family history significant for mother with coronary artery disease, father died from heart attack, 2 brother died from lung cancer one of them was small cell lung cancer. The patient is single and has no children. She was accompanied today by her friend Jillian Bright. She used to work as a Theme park manager. She has a history of smoking 2.5 pack per day for around 30 years and quit 6 years ago. She drinks alcohol occasionally and no history of drug abuse.  HPI  Past Medical History  Diagnosis Date  . Diabetes mellitus   . COPD (chronic obstructive pulmonary disease) ([place])     sees Dr. [name]  . Sleep apnea, obstructive   . Pulmonary HTN ([place])   . Obesity (BMI 30.0-34.9)   . Hyperlipidemia   . Myocardial infarction ([place])   . Coronary artery disease   . Anxiety   . Shortness of breath   . GERD (gastroesophageal reflux disease)   . Arthritis     rheumatoid, sees Dr. Tobie Lords  . CHF (congestive heart failure) ([place])   . Pneumonia   . Bronchitis     hx of   . History of hiatal  hernia   . Acute on chronic diastolic (congestive) heart failure ([place]) 09/07/2015  . Aortic stenosis 01/14/2012  . Pleural effusion on right 09/07/2015  . Tricuspid regurgitation   . Right-sided congestive heart failure ([place])   . Mitral regurgitation   . Lymphadenopathy, axillary 11/17/2015    Past Surgical History  Procedure Laterality Date  . Appendectomy    . Tonsillectomy and adenoidectomy    . Lumbar disc surgery    . Hemorrhoid surgery      . Coronary angioplasty    . Endarterectomy Left 12/04/2012    Procedure: ENDARTERECTOMY CAROTID;  Surgeon: Mal Misty, MD;  Location: [facility];  Service: Vascular;  Laterality: Left;  . Patch angioplasty Left 12/04/2012    Procedure: PATCH ANGIOPLASTY;  Surgeon: Mal Misty, MD;  Location: [facility];  Service: Vascular;  Laterality: Left;  Marland Kitchen Eye surgery      bilat cataract surg   . Colonscopy     . Total hip arthroplasty Left 07/30/2014    Procedure: LEFT TOTAL HIP ARTHROPLASTY ANTERIOR APPROACH;  Surgeon: Meredith Pel, MD;  Location: [facility];  Service: Orthopedics;  Laterality: Left;  . Tee without cardioversion N/A 09/21/2015    Procedure: TRANSESOPHAGEAL ECHOCARDIOGRAM (TEE);  Surgeon: Larey Dresser, MD;  Location[facility];  Service: Cardiovascular;  Laterality: N/A;    Family History  Problem Relation Age of Onset  . Hypertension Mother   . Hyperlipidemia Mother   . Stroke Mother   . Heart attack Mother   . Cancer Mother   . Heart attack Father   . Cancer Brother   . Asthma Maternal Grandfather     Social History Social History  Substance Use Topics  . Smoking status: Former Smoker -- 2.00 packs/day for 36 years    Types: Cigarettes    Quit date: 02/02/2013  . Smokeless tobacco: Never Used     Comment: had restarted and quit again 01/27/12   . Alcohol Use: 0.0 oz/week    0 Standard drinks or equivalent per week     Comment: rare    Allergies  Allergen Reactions  . Codeine Itching    Takes vicodin at home    Current Outpatient Prescriptions  Medication Sig Dispense Refill  . ALPRAZolam (XANAX) 0.5 MG tablet Take 0.5 mg by mouth every 8 (eight) hours as needed for anxiety.    Marland Kitchen aspirin 81 MG chewable tablet Chew 1 tablet (81 mg total) by mouth daily. 30 tablet 0  . atorvastatin (LIPITOR) 20 MG tablet Take 1 tablet (20 mg total) by mouth daily. 90 tablet 3  . budesonide-formoterol (SYMBICORT) 160-4.5 MCG/ACT inhaler Inhale 2 puffs into the lungs 2 (two) times  daily. 1 Inhaler 5  . DULoxetine (CYMBALTA) 60 MG capsule Take 1 capsule (60 mg total) by mouth daily. 30 capsule 11  . ferrous sulfate 325 (65 FE) MG tablet Take 1 tablet (325 mg total) by mouth 2 (two) times daily with a meal. 90 tablet 0  . gabapentin (NEURONTIN) 800 MG tablet Take 800 mg by mouth 3 (three) times daily.  0  . glipiZIDE (GLUCOTROL) 10 MG tablet TAKE 1 TABLET (10 MG TOTAL) BY MOUTH 2 (TWO) TIMES DAILY BEFORE A MEAL. 60 tablet 10  . metFORMIN (GLUCOPHAGE) 500 MG tablet Take 1 tablet (500 mg total) by mouth 3 (three) times daily. 90 tablet 11  . omeprazole (PRILOSEC) 40 MG capsule Take 1 capsule (40 mg total) by mouth daily before breakfast. 90 capsule 3  .  potassium chloride SA (K-DUR,KLOR-CON) 20 MEQ tablet Take 1 tablet (20 mEq total) by mouth daily. 30 tablet 0  . sitaGLIPtin (JANUVIA) 100 MG tablet Take 1 tablet (100 mg total) by mouth daily. 90 tablet 3  . spironolactone (ALDACTONE) 25 MG tablet Take 1 tablet (25 mg total) by mouth every morning. 30 tablet 11  . torsemide (DEMADEX) 20 MG tablet Take 3 tablets (60 mg total) by mouth daily. 90 tablet 3  . traMADol (ULTRAM) 50 MG tablet Take 1 tablet (50 mg total) by mouth every 6 (six) hours as needed for severe pain. 120 tablet 2  . VESICARE 10 MG tablet GIVE 1OMG BY MOUTH ONE TIME A DAY FOR OVERDRIVEN BLADDER 30 tablet 6  . albuterol (PROVENTIL HFA;VENTOLIN HFA) 108 (90 BASE) MCG/ACT inhaler Inhale 2 puffs into the lungs every 6 (six) hours as needed for wheezing or shortness of breath. Reported on 11/17/2015    . vitamin B-12 (CYANOCOBALAMIN) 1000 MCG tablet Take 1 tablet (1,000 mcg total) by mouth daily. 30 tablet 0   No current facility-administered medications for this visit.    Review of Systems  Constitutional: negative Eyes: negative Ears, nose, mouth, throat, and face: negative Respiratory: positive for cough, dyspnea on exertion and sputum Cardiovascular: negative Gastrointestinal:  negative Genitourinary:negative Integument/breast: negative Hematologic/lymphatic: negative Musculoskeletal:positive for arthralgias and back pain Neurological: negative Behavioral/Psych: negative Endocrine: negative Allergic/Immunologic: negative  Physical Exam  AYT:KZSWF, healthy, no distress, well nourished and well developed SKIN: skin color, texture, turgor are normal, no rashes or significant lesions HEAD: Normocephalic, No masses, lesions, tenderness or abnormalities EYES: normal, PERRLA, Conjunctiva are pink and non-injected EARS: External ears normal OROPHARYNX:no exudate, no erythema and lips, buccal mucosa, and tongue normal  NECK: supple, no adenopathy, no JVD LYMPH:  no palpable lymphadenopathy, no hepatosplenomegaly BREAST:not examined LUNGS: expiratory wheezes bilaterally HEART: regular rate & rhythm and no murmurs ABDOMEN:abdomen soft, non-tender, normal bowel sounds and no masses or organomegaly BACK: Back symmetric, no curvature., No CVA tenderness EXTREMITIES:no joint deformities, effusion, or inflammation, no edema, no skin discoloration  NEURO: alert & oriented x 3 with fluent speech, no focal motor/sensory deficits  PERFORMANCE STATUS: ECOG 1  LABORATORY DATA: Lab Results  Component Value Date   WBC 7.6 11/17/2015   HGB 12.8 11/17/2015   HCT 42.4 11/17/2015   MCV 86.9 11/17/2015   PLT 209 11/17/2015      Chemistry      Component Value Date/Time   NA 137 11/17/2015 1346   NA 134* 10/17/2015 1734   K 5.1 11/17/2015 1346   K 5.0 10/17/2015 1734   CL 89* 10/17/2015 1734   CO2 30* 11/17/2015 1346   CO2 35* 10/17/2015 1734   BUN 25.6 11/17/2015 1346   BUN 45* 10/17/2015 1734   CREATININE 0.9 11/17/2015 1346   CREATININE 1.49* 10/17/2015 1734      Component Value Date/Time   CALCIUM 9.5 11/17/2015 1346   CALCIUM 9.5 10/17/2015 1734   ALKPHOS 74 11/17/2015 1346   ALKPHOS 63 10/17/2015 1734   AST 15 11/17/2015 1346   AST 26 10/17/2015 1734    ALT 10 11/17/2015 1346   ALT 17 10/17/2015 1734   BILITOT 0.41 11/17/2015 1346   BILITOT 0.7 10/17/2015 1734       RADIOGRAPHIC STUDIES: Nm Pet Image Initial (pi) Skull Base To Thigh  11/08/2015  CLINICAL DATA:  Initial treatment strategy for lung nodule. 1.2 cm left upper lobe pulmonary nodule. EXAM: NUCLEAR MEDICINE PET SKULL BASE TO THIGH TECHNIQUE: 8.5  mCi F-18 FDG was injected intravenously. Full-ring PET imaging was performed from the skull base to thigh after the radiotracer. CT data was obtained and used for attenuation correction and anatomic localization. FASTING BLOOD GLUCOSE:  Value: 139 mg/dl COMPARISON:  CT chest 09/21/2015 FINDINGS: NECK No hypermetabolic lymph nodes in the neck. CHEST 13 mm left upper lobe pulmonary nodule is hypermetabolic with SUV max = 4.2. 7 mm short axis left axillary lymph node shows mild hypermetabolism with SUV max = 1.9. ABDOMEN/PELVIS No abnormal hypermetabolic activity within the liver, pancreas, adrenal glands, or spleen. 7 mm short axis right pelvic sidewall lymph node (image 157 series 4) is hypermetabolic with SUV max = 5.2. This is associated with 10 mm short axis hypermetabolic right common femoral lymph node. 1.2 cm short axis right groin lymph node (image 173 series 4) is hypermetabolic with SUV max = 4.9. There is abdominal aortic atherosclerosis without aneurysm. Probable layering tiny stones in the gallbladder lumen. Left colonic diverticulosis without diverticulitis. SKELETON No focal hypermetabolic activity to suggest skeletal metastasis. IMPRESSION: 1. 13 mm hypermetabolic left upper lobe pulmonary nodule, highly suspicious for primary bronchogenic neoplasm. 2. Hypermetabolic right pelvic and groin lymph nodes. Given the lack of hypermetabolic metastatic disease elsewhere, metastatic spread from the left upper lobe nodule is considered unlikely. Lymphoma would be a consideration. Although no hypermetabolism in these lymph nodes is slightly higher  than would be expected for infectious/inflammatory process, correlation for right lower extremity disease may prove helpful. 3. Mild FDG uptake in a normal size left axillary lymph node may be infectious/inflammatory. 4. Abdominal aortic atherosclerosis. 5. Possible cholelithiasis. Electronically Signed   By: Misty Stanley M.D.   On: 11/08/2015 15:47    ASSESSMENT: This is a very pleasant 70 years old white female with likely a stage IA (T1a, N0, M0) lung cancer likely non-small cell carcinoma presented with a small hypermetabolic left upper lobe pulmonary nodule. Recent PET scan showed also mildly hypermetabolic left axillary and pelvic lymphadenopathy suspicious for low-grade lymphoma.  PLAN: I had a lengthy discussion with the patient and her friend today about her current disease stage, prognosis and treatment options. She has no tissue diagnosis yet. The patient is a moderate risk for CT-guided core biopsy of the left upper lobe lung nodule. She also cannot tolerate sedation for the bronchoscopy and electromagnetic navigational bronchoscopy. She is not a good surgical candidate for resection based on her pulmonary function and other comorbidities. I discussed with the patient several options including proceeding with the CT-guided core biopsy taken into consideration the high risk of pneumothorax versus proceeding with stereotactic body radiotherapy without tissue diagnosis based on the hypermetabolic activity of the left upper lobe pulmonary nodule. The patient will see Dr. Tammi Klippel later today for evaluation and discussion of the radiotherapy option. I will arrange for her to have repeat CT scan of the chest, abdomen and pelvis in 6 months for reevaluation of the treatment of the left upper lobe nodule as well as the suspicious lymphadenopathy. The patient agreed to the current plan. She was seen during the multi-suppress thoracic oncology clinic today by medical oncology, radiation oncology,  thoracic navigator and physical therapist. She was advised to call immediately if she has any concerning symptoms in the interval. The patient voices understanding of current disease status and treatment options and is in agreement with the current care plan.  All questions were answered. The patient knows to call the clinic with any problems, questions or concerns. We can certainly see the  patient much sooner if necessary.  Thank you so much for allowing me to participate in the care of Jerelene Redden. I will continue to follow up the patient with you and assist in her care.  I spent 40 minutes counseling the patient face to face. The total time spent in the appointment was 60 minutes.  Disclaimer: This note was dictated with voice recognition software. Similar sounding words can inadvertently be transcribed and may not be corrected upon review.   Vanda Waskey K. November 17, 2015, 3:04 PM

## 2015-11-17 NOTE — Therapy (Signed)
[place], [place], [address] Phone: [phone]   Fax:  [phone]  Physical Therapy Evaluation  Patient Details  Name: Jillian Bright MRN: [id number] Date of Birth: 1946/03/15 Referring Provider: Dr. Curt Bears  Encounter Date: 11/17/2015      PT End of Session - 11/17/15 1550    Visit Number 1   Number of Visits 1   PT Start Time 1503   PT Stop Time 1535   PT Time Calculation (min) 32 min   Activity Tolerance Patient tolerated treatment well   Behavior During Therapy [facility] for tasks assessed/performed      Past Medical History  Diagnosis Date  . Diabetes mellitus   . COPD (chronic obstructive pulmonary disease) ([place])     sees Dr. [name]  . Sleep apnea, obstructive   . Pulmonary HTN ([place])   . Obesity (BMI 30.0-34.9)   . Hyperlipidemia   . Myocardial infarction ([place])   . Coronary artery disease   . Anxiety   . Shortness of breath   . GERD (gastroesophageal reflux disease)   . Arthritis     rheumatoid, sees Dr. Tobie Lords  . CHF (congestive heart failure) ([place])   . Pneumonia   . Bronchitis     hx of   . History of hiatal hernia   . Acute on chronic diastolic (congestive) heart failure ([place]) 09/07/2015  . Aortic stenosis 01/14/2012  . Pleural effusion on right 09/07/2015  . Tricuspid regurgitation   . Right-sided congestive heart failure ([place])   . Mitral regurgitation   . Lymphadenopathy, axillary 11/17/2015    Past Surgical History  Procedure Laterality Date  . Appendectomy    . Tonsillectomy and adenoidectomy    . Lumbar disc surgery    . Hemorrhoid surgery    . Coronary angioplasty    . Endarterectomy Left 12/04/2012    Procedure: ENDARTERECTOMY CAROTID;  Surgeon: Mal Misty, MD;  Location: [place];  Service: Vascular;  Laterality: Left;  . Patch angioplasty Left 12/04/2012    Procedure: PATCH ANGIOPLASTY;  Surgeon: Mal Misty, MD;  Location: [place];  Service:  Vascular;  Laterality: Left;  [facility] Kitchen Eye surgery      bilat cataract surg   . Colonscopy     . Total hip arthroplasty Left 07/30/2014    Procedure: LEFT TOTAL HIP ARTHROPLASTY ANTERIOR APPROACH;  Surgeon: Meredith Pel, MD;  Location: [facility];  Service: Orthopedics;  Laterality: Left;  . Tee without cardioversion N/A 09/21/2015    Procedure: TRANSESOPHAGEAL ECHOCARDIOGRAM (TEE);  Surgeon: Larey Dresser, MD;  Location: [place];  Service: Cardiovascular;  Laterality: N/A;    There were no vitals filed for this visit.       Subjective Assessment - 11/17/15 1536    Subjective reports low back and right knee pain; right knee also swells.  Reports recent hospitalization during which she was treated with diuretic for significant swelling; she was admitted at least partly due to multiple falls.  She went to a rehab facility after this and then had some home health care (possibly therapy, though patient called caregivers nurses).   Patient is accompained by: --  friend   Pertinent History Pt. presented with CHF and COPD exacerbation; chest CT showed an enlarging left lung nodule.  PET shows left lower lobe 12 mm nodule and 11 mm precarinal node.  She may have a biopsy done and is expected to have XRT, probably not chemo.  Ex-smoker who quit 2014 after 72 pack-years; on home O2 (3L daytime and 4L night), CHF, pulmonary HTN, RA; h/o lumbar disc surgery years ago; left THA 07/30/2014.   Patient Stated Goals get info from all lung clinic providers   Currently in Pain? Yes   Pain Score 8    Pain Location Back   Pain Orientation Lower   Pain Type Chronic pain   Aggravating Factors  bending, walking   Pain Relieving Factors sitting   Multiple Pain Sites Yes   Pain Score 8   Pain Location Knee   Pain Orientation Right   Pain Type Chronic pain   Aggravating Factors  walk   Pain Relieving Factors unsure            [facility] PT Assessment - 11/17/15 0001    Assessment   Medical Diagnosis probably  lung cancer with lung nodules on scans   Referring Provider Dr. Curt Bears   Onset Date/Surgical Date 11/08/15   Precautions   Precautions Fall;Other (comment)   Precaution Comments on O2 day and night; cancer precautions   Restrictions   Weight Bearing Restrictions No   Balance Screen   Has the patient fallen in the past 6 months Yes   How many times? --  "too many to count" prior to hospitalization; none since   Has the patient had a decrease in activity level because of a fear of falling?  No   Is the patient reluctant to leave their home because of a fear of falling?  No   Home Environment   Living Environment Private residence   Living Arrangements Alone   Type of [place] entrance   [place] One level   Prior Function   Level of Independence Independent with community mobility with device   Leisure has a list of exercises given her by home health worker that she plans to do; sometimes walks a bit   Cognition   Overall Cognitive Status Within Functional Limits for tasks assessed   Observation/Other Assessments   Observations pleasant woman with O2 through nasal canula; right leg appears swollen   Functional Tests   Functional tests Sit to Stand   Sit to Stand   Comments 6 times in 30 seconds, below average for her age   Posture/Postural Control   Posture/Postural Control Postural limitations   Posture Comments right knee stays bent when she stands   ROM / Strength   AROM / PROM / Strength AROM   AROM   Overall AROM Comments Trunk AROM in standing:  flexion and extension WFL; sidebend approx. 10% loss with some discomfort leaning left; rotation 20% loss bilat.   Ambulation/Gait   Ambulation/Gait Yes   Ambulation/Gait Assistance 6: Modified independent (Device/Increase time)  holds onto O2 tank; antalgic gait (right knee)   Balance   Balance Assessed Yes   Dynamic Standing Balance   Dynamic Standing - Comments reaches forward 11 inches in  standing, average for her age                           PT Education - 11/17/15 1550    Education provided Yes   Education Details posture, breathing, staying active with exercise and walking, Cure article on staying active, energy conservation, PT info   Person(s) Educated Patient;Other (comment)  friend   Methods Explanation;Handout   Comprehension Verbalized understanding  Lung Clinic Goals - December 06, 2015 1557    Patient will be able to verbalize understanding of the benefit of exercise to decrease fatigue.   Status Achieved   Patient will be able to verbalize the importance of posture.   Status Achieved   Patient will be able to demonstrate diaphragmatic breathing for improved lung function.   Status Achieved   Patient will be able to verbalize understanding of the role of physical therapy to prevent functional decline and who to contact if physical therapy is needed.   Status Achieved             Plan - 2015/12/06 1551    Clinical Impression Statement Patient on 3L oxygen during the day and 4L at night with h/o CHF and COPD, now with lesions seen on CT and PET scans in left lung and expected to have XRT to treat this.  She has limited flexibility and mobility; she has back and knee pain; functional sit to stand test was below average for her age (6 times compared to average of 10).  Gait is antalgic for right knee pain and typically she uses some sort of assistive deivce (standard or rollator walker, cane, or her oxygen tank).  Her case was discussed at multidisciplinary conference prior to this eval.  She has RA and a h/o lumbar disc surgery and left THA.   Rehab Potential Fair   PT Frequency One time visit   PT Treatment/Interventions Patient/family education   PT Next Visit Plan None at this time; she has recently had home health exercise and plans to continue with this independently.   PT Home Exercise Plan see education section    Consulted and Agree with Plan of Care Patient      Patient will benefit from skilled therapeutic intervention in order to improve the following deficits and impairments:  Abnormal gait, Cardiopulmonary status limiting activity, Decreased activity tolerance, Pain, Decreased range of motion  Visit Diagnosis: Difficulty in walking, not elsewhere classified  Bilateral low back pain without sciatica  Muscle weakness (generalized)  Localized edema      G-Codes - 12/06/2015 1558    Functional Assessment Tool Used clinical judgement   Functional Limitation Mobility: Walking and moving around   Mobility: Walking and Moving Around Current Status [phone]) At least 40 percent but less than 60 percent impaired, limited or restricted   Mobility: Walking and Moving Around Goal Status [phone]) At least 40 percent but less than 60 percent impaired, limited or restricted   Mobility: Walking and Moving Around Discharge Status [phone]) At least 40 percent but less than 60 percent impaired, limited or restricted       Problem List Patient Active Problem List   Diagnosis Date Noted  . Lymphadenopathy, axillary 12/06/2015  . Lymphadenopathy, inguinal December 06, 2015  . Hypokalemia 09/29/2015  . Hypomagnesemia 09/29/2015  . Iron deficiency anemia 09/29/2015  . Metabolic alkalosis 40/81/4481  . Type II diabetes mellitus with renal manifestations ([place]) 09/28/2015  . Aortic stenosis, severe 09/28/2015  . Solitary pulmonary nodule   . COPD exacerbation ([place])   . Hypoxemia   . Vitamin B12 deficiency 09/23/2015  . Acute encephalopathy 09/22/2015  . Acute-on-chronic respiratory failure ([place]) 09/22/2015  . Chronic obstructive pulmonary disease ([place])   . Lung nodule   . OSA (obstructive sleep apnea)   . Pleural effusion   . Acute hypoxemic respiratory failure ([place])   . Morbid obesity ([place])   . Tricuspid regurgitation   . Right-sided congestive heart failure ([place])   .  Mitral regurgitation   . Abnormal chest  x-ray   . Acute on chronic congestive heart failure ([place])   . Acute on chronic diastolic (congestive) heart failure ([place]) 09/07/2015  . On home oxygen therapy 09/07/2015  . Microcytic anemia 09/07/2015  . CHF (congestive heart failure) ([place]) 09/07/2015  . CHF exacerbation ([place]) 09/07/2015  . Pleural effusion on right 09/07/2015  . Pressure ulcer 11/18/2014  . DM type 2 (diabetes mellitus, type 2) ([place]) 11/18/2014  . Syncope 11/17/2014  . Acute on chronic respiratory failure with hypercapnia ([place]) 11/17/2014  . 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 Weakness 01/29/2014  . Abdominal pain 01/29/2014  . Carotid stenosis 06/23/2013  . Rheumatoid arthritis ([place]) 06/08/2013  . Fibromyalgia 06/08/2013  . Occlusion and stenosis of carotid artery without mention of cerebral infarction 12/02/2012  . AKI (acute kidney injury) ([place]) 02/01/2012  . Aortic stenosis 01/14/2012  . CAD (coronary artery disease) 11/07/2011  . Obesity hypoventilation syndrome ([place]) 06/10/2009  . COPD with emphysema ([place]) 05/30/2009  . Pulmonary HTN ([place]) 05/25/2009  . ABDOMINAL PAIN, LOWER 08/04/2008  . DIABETES MELLITUS, TYPE II 08/18/2007  . Chronic diastolic heart failure ([place]) 08/18/2007  . Other and unspecified hyperlipidemia 08/04/2007  . Generalized anxiety disorder 08/04/2007  . Essential hypertension 08/04/2007  . GERD 08/04/2007  . DEGENERATIVE JOINT DISEASE 08/04/2007  . DEPENDENT EDEMA 08/04/2007    SALISBURY,DONNA 11/17/2015, 3:58 PM  [place], [place], [address] Phone: [phone]   Fax:  [phone]  Name: Jillian Bright MRN: [id number] Date of Birth: [birth year]   Serafina Royals, PT 11/17/2015 3:58 PM

## 2015-11-17 NOTE — Progress Notes (Signed)
Radiation Oncology         [phone] ________________________________  Thoracic Oncology Clinic Visit  Name: Jillian Bright MRN: [id number]  Date: 11/17/2015  DOB: December 18, 1945  CC:FRY,STEPHEN A, MD  [name], MD   REFERRING PHYSICIAN: [name], MD  DIAGNOSIS: The encounter diagnosis was Lung nodule.    ICD-9-CM ICD-10-CM   1. Lung nodule 793.11 R91.1     HISTORY OF PRESENT ILLNESS: Jillian Bright is a 70 y.o. female seen at the request of Dr. Titus Mould for an enlarging left lower lobe nodule. The patient presented to the emergency room and April 2016 with a COPD exacerbation and episode of congestive heart failure. Doing an initial chest x-ray there were some opacity on her chest x-ray in the left upper lobe of the lung, a CT scan of the chest on 09/21/2015 revealed a 12 mm spiculated nodule that had increased from 9 mm in 2015, as well as an 11 mm precarinal lymph node. A CT of the head on 10/17/2015 was negative for acute findings and negative for concerns for disease. She subsequently underwent a PET scan on 11/08/2015, the left upper lobe lesion revealed an SUV of 4.2, incidentally she had bilateral pelvic nodes and groin nodes that were noted and a left axillary lymph node with a low level SUV of 1.9. She was presented at multidisciplinary thoracic conference, and recommendations were made for her to proceed with a biopsy. She will be having this performed with pulmonary. She is seen today by Dr. Tammi Klippel for discussion of the role for possible SBRT if her workup continues to be consistent with malignancy.  PREVIOUS RADIATION THERAPY: No  PAST MEDICAL HISTORY:  Past Medical History  Diagnosis Date  . Diabetes mellitus   . COPD (chronic obstructive pulmonary disease) ([place])     sees Dr. [name]  . Sleep apnea, obstructive   . Pulmonary HTN ([place])   . Obesity (BMI 30.0-34.9)   . Hyperlipidemia   . Myocardial infarction ([place])   . Coronary artery disease   . Anxiety     . Shortness of breath   . GERD (gastroesophageal reflux disease)   . Arthritis     rheumatoid, sees Dr. Tobie Lords  . CHF (congestive heart failure) ([place])   . Pneumonia   . Bronchitis     hx of   . History of hiatal hernia   . Acute on chronic diastolic (congestive) heart failure ([place]) 09/07/2015  . Aortic stenosis 01/14/2012  . Pleural effusion on right 09/07/2015  . Tricuspid regurgitation   . Right-sided congestive heart failure ([place])   . Mitral regurgitation       PAST SURGICAL HISTORY: Past Surgical History  Procedure Laterality Date  . Appendectomy    . Tonsillectomy and adenoidectomy    . Lumbar disc surgery    . Hemorrhoid surgery    . Coronary angioplasty    . Endarterectomy Left 12/04/2012    Procedure: ENDARTERECTOMY CAROTID;  Surgeon: Mal Misty, MD;  Location: [facility];  Service: Vascular;  Laterality: Left;  . Patch angioplasty Left 12/04/2012    Procedure: PATCH ANGIOPLASTY;  Surgeon: Mal Misty, MD;  Location: [facility];  Service: Vascular;  Laterality: Left;  Marland Kitchen Eye surgery      bilat cataract surg   . Colonscopy     . Total hip arthroplasty Left 07/30/2014    Procedure: LEFT TOTAL HIP ARTHROPLASTY ANTERIOR APPROACH;  Surgeon: Meredith Pel, MD;  Location: [facility];  Service:  Orthopedics;  Laterality: Left;  . Tee without cardioversion N/A 09/21/2015    Procedure: TRANSESOPHAGEAL ECHOCARDIOGRAM (TEE);  Surgeon: Larey Dresser, MD;  Location: [facility] ENDOSCOPY;  Service: Cardiovascular;  Laterality: N/A;    FAMILY HISTORY:  Family History  Problem Relation Age of Onset  . Hypertension Mother   . Hyperlipidemia Mother   . Stroke Mother   . Heart attack Mother   . Cancer Mother   . Heart attack Father   . Cancer Brother   . Asthma Maternal Grandfather     SOCIAL HISTORY:  Social History   Social History  . Marital Status: Widowed    Spouse Name: N/A  . Number of Children: N/A  . Years of Education: N/A   Occupational History  . used to work as a  Theme park manager    Social History Main Topics  . Smoking status: Former Smoker -- 2.00 packs/day for 36 years    Types: Cigarettes    Quit date: 02/02/2013  . Smokeless tobacco: Never Used     Comment: had restarted and quit again 01/27/12   . Alcohol Use: 0.0 oz/week    0 Standard drinks or equivalent per week     Comment: rare  . Drug Use: No  . Sexual Activity: Not on file   Other Topics Concern  . Not on file   Social History Narrative   Lives alone    ALLERGIES: Codeine  MEDICATIONS:  Current Outpatient Prescriptions  Medication Sig Dispense Refill  . albuterol (PROVENTIL HFA;VENTOLIN HFA) 108 (90 BASE) MCG/ACT inhaler Inhale 2 puffs into the lungs every 6 (six) hours as needed for wheezing or shortness of breath. Reported on 11/17/2015    . ALPRAZolam (XANAX) 0.5 MG tablet Take 0.5 mg by mouth every 8 (eight) hours as needed for anxiety.    Marland Kitchen aspirin 81 MG chewable tablet Chew 1 tablet (81 mg total) by mouth daily. 30 tablet 0  . atorvastatin (LIPITOR) 20 MG tablet Take 1 tablet (20 mg total) by mouth daily. 90 tablet 3  . budesonide-formoterol (SYMBICORT) 160-4.5 MCG/ACT inhaler Inhale 2 puffs into the lungs 2 (two) times daily. 1 Inhaler 5  . DULoxetine (CYMBALTA) 60 MG capsule Take 1 capsule (60 mg total) by mouth daily. 30 capsule 11  . ferrous sulfate 325 (65 FE) MG tablet Take 1 tablet (325 mg total) by mouth 2 (two) times daily with a meal. 90 tablet 0  . gabapentin (NEURONTIN) 800 MG tablet Take 800 mg by mouth 3 (three) times daily.  0  . glipiZIDE (GLUCOTROL) 10 MG tablet TAKE 1 TABLET (10 MG TOTAL) BY MOUTH 2 (TWO) TIMES DAILY BEFORE A MEAL. 60 tablet 10  . metFORMIN (GLUCOPHAGE) 500 MG tablet Take 1 tablet (500 mg total) by mouth 3 (three) times daily. 90 tablet 11  . omeprazole (PRILOSEC) 40 MG capsule Take 1 capsule (40 mg total) by mouth daily before breakfast. 90 capsule 3  . potassium chloride SA (K-DUR,KLOR-CON) 20 MEQ tablet Take 1 tablet (20 mEq total) by mouth  daily. 30 tablet 0  . sitaGLIPtin (JANUVIA) 100 MG tablet Take 1 tablet (100 mg total) by mouth daily. 90 tablet 3  . spironolactone (ALDACTONE) 25 MG tablet Take 1 tablet (25 mg total) by mouth every morning. 30 tablet 11  . torsemide (DEMADEX) 20 MG tablet Take 3 tablets (60 mg total) by mouth daily. 90 tablet 3  . traMADol (ULTRAM) 50 MG tablet Take 1 tablet (50 mg total) by mouth every 6 (six)  hours as needed for severe pain. 120 tablet 2  . VESICARE 10 MG tablet GIVE 1OMG BY MOUTH ONE TIME A DAY FOR OVERDRIVEN BLADDER 30 tablet 6  . vitamin B-12 (CYANOCOBALAMIN) 1000 MCG tablet Take 1 tablet (1,000 mcg total) by mouth daily. 30 tablet 0   No current facility-administered medications for this encounter.    REVIEW OF SYSTEMS:  On review of systems, the patient reports that she is doing well overall. She denies any chest pain, shortness of breath, cough, fevers, chills, night sweats, unintended weight changes. She has noticed some cloudy urine discomfort at the end of her urine stream and the last few days. She denies any bowel disturbances, and denies abdominal pain, nausea or vomiting. She denies any new musculoskeletal or joint aches or pains. The patient reports that it has been 10-15 years since last pelvic exam. The patient reports that she does not receive routine mammogram screenings.  A complete review of systems is obtained and is otherwise negative.      PHYSICAL EXAM:  Vitals with BMI 11/17/2015  Height '5\' 6"'$   Weight 174 lbs 11 oz  BMI 00.8  Systolic 676  Diastolic 66  Pulse 195  Respirations 18   Pain Scale 0/10 In general this is a well appearing Caucasian female in no acute distress. She is alert and oriented x4 and appropriate throughout the examination. HEENT reveals that the patient is normocephalic, atraumatic. EOMs are intact. PERRLA. Skin is intact without any evidence of gross lesions. Cardiovascular exam reveals a regular rate and rhythm, no clicks rubs or murmurs  are auscultated. Chest is clear to auscultation bilaterally. Lymphatic assessment is performed and does not reveal any adenopathy in the cervical, supraclavicular, axillary, or inguinal chains. Abdomen has active bowel sounds in all quadrants and is intact. The abdomen is soft, non tender, non distended. Lower extremities are negative for pretibial pitting edema, deep calf tenderness, cyanosis or clubbing.   KPS = 70  100 - Normal; no complaints; no evidence of disease. 90   - Able to carry on normal activity; minor signs or symptoms of disease. 80   - Normal activity with effort; some signs or symptoms of disease. 77   - Cares for self; unable to carry on normal activity or to do active work. 60   - Requires occasional assistance, but is able to care for most of his personal needs. 50   - Requires considerable assistance and frequent medical care. 42   - Disabled; requires special care and assistance. 62   - Severely disabled; hospital admission is indicated although death not imminent. 44   - Very sick; hospital admission necessary; active supportive treatment necessary. 10   - Moribund; fatal processes progressing rapidly. 0     - Dead  Karnofsky DA, Abelmann [place], Craver LS and Burchenal [facility] [phone]) The use of the nitrogen mustards in the palliative treatment of carcinoma: with particular reference to bronchogenic carcinoma Cancer 1 634-56  LABORATORY DATA:  Lab Results  Component Value Date   WBC 7.6 11/17/2015   HGB 12.8 11/17/2015   HCT 42.4 11/17/2015   MCV 86.9 11/17/2015   PLT 209 11/17/2015   Lab Results  Component Value Date   NA 137 11/17/2015   K 5.1 11/17/2015   CL 89* 10/17/2015   CO2 30* 11/17/2015   Lab Results  Component Value Date   ALT 10 11/17/2015   AST 15 11/17/2015   ALKPHOS 74 11/17/2015   BILITOT 0.41 11/17/2015  RADIOGRAPHY: Nm Pet Image Initial (pi) Skull Base To Thigh  11/08/2015  CLINICAL DATA:  Initial treatment strategy for lung nodule. 1.2  cm left upper lobe pulmonary nodule. EXAM: NUCLEAR MEDICINE PET SKULL BASE TO THIGH TECHNIQUE: 8.5 mCi F-18 FDG was injected intravenously. Full-ring PET imaging was performed from the skull base to thigh after the radiotracer. CT data was obtained and used for attenuation correction and anatomic localization. FASTING BLOOD GLUCOSE:  Value: 139 mg/dl COMPARISON:  CT chest 09/21/2015 FINDINGS: NECK No hypermetabolic lymph nodes in the neck. CHEST 13 mm left upper lobe pulmonary nodule is hypermetabolic with SUV max = 4.2. 7 mm short axis left axillary lymph node shows mild hypermetabolism with SUV max = 1.9. ABDOMEN/PELVIS No abnormal hypermetabolic activity within the liver, pancreas, adrenal glands, or spleen. 7 mm short axis right pelvic sidewall lymph node (image 157 series 4) is hypermetabolic with SUV max = 5.2. This is associated with 10 mm short axis hypermetabolic right common femoral lymph node. 1.2 cm short axis right groin lymph node (image 173 series 4) is hypermetabolic with SUV max = 4.9. There is abdominal aortic atherosclerosis without aneurysm. Probable layering tiny stones in the gallbladder lumen. Left colonic diverticulosis without diverticulitis. SKELETON No focal hypermetabolic activity to suggest skeletal metastasis. IMPRESSION: 1. 13 mm hypermetabolic left upper lobe pulmonary nodule, highly suspicious for primary bronchogenic neoplasm. 2. Hypermetabolic right pelvic and groin lymph nodes. Given the lack of hypermetabolic metastatic disease elsewhere, metastatic spread from the left upper lobe nodule is considered unlikely. Lymphoma would be a consideration. Although no hypermetabolism in these lymph nodes is slightly higher than would be expected for infectious/inflammatory process, correlation for right lower extremity disease may prove helpful. 3. Mild FDG uptake in a normal size left axillary lymph node may be infectious/inflammatory. 4. Abdominal aortic atherosclerosis. 5. Possible  cholelithiasis. Electronically Signed   By: Misty Stanley M.D.   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  PLAN: Dr. Tammi Klippel discusses the findings from her workup including her imaging studies, and examination findings. He discusses with her that the clinical picture suggests that this is a stage I lung cancer. She is not felt to be a surgical candidate given her comorbidities and oxygen requirements. The patient has been told by pulmonology that she is not a suitable candidate for surgical bronchoscopy or needle biopsy due to complications from anesthesia. Her workup continues to be consistent with malignancy, and although there are limitations by not having tissue confirmation of disease, Dr. Tammi Klippel reviews that the risk benefit ratio of treating her in this setting, versus waiting to perform another PET scan in 2-3 months. The patient is willing to take on the risks of radiotherapy without biopsy and is interested in moving forward. Dr. Tammi Klippel recommends SBRT to the left upper lobe lesion. Dr. Tammi Klippel discusses the risks, benefits, short and long-term effects of the treatment, and the patient is interested in moving forward if appropriate.   The patient has scheduled a CT sim appointment for June 30th at 2:00 p.m. A urine test will be ordered and will also schedule gyn appointment.   The above documentation reflects my direct findings during this shared patient visit. Please see the separate note by Dr. Tammi Klippel on this date for the remainder of the patient's plan of care.[name], PAC

## 2015-11-18 ENCOUNTER — Other Ambulatory Visit: Payer: Self-pay | Admitting: Radiation Oncology

## 2015-11-18 ENCOUNTER — Other Ambulatory Visit: Payer: Self-pay | Admitting: Family Medicine

## 2015-11-18 ENCOUNTER — Telehealth: Payer: Self-pay | Admitting: *Deleted

## 2015-11-18 ENCOUNTER — Telehealth: Payer: Self-pay | Admitting: Internal Medicine

## 2015-11-18 ENCOUNTER — Other Ambulatory Visit: Payer: Self-pay | Admitting: Internal Medicine

## 2015-11-18 DIAGNOSIS — Z0000 Encounter for general adult medical examination without abnormal findings: Secondary | ICD-10-CM

## 2015-11-18 NOTE — Telephone Encounter (Signed)
S.w.l pt and advised on DEC appts...the patient ok and aware

## 2015-11-18 NOTE — Telephone Encounter (Signed)
CALLED PATIENT TO INFORM OF APPT. WITH DR. VARNADO ON 12-20-15 - ARRIVAL TIME- 1:45 PM, SPOKE WITH PATIENT AND SHE IS AWARE OF THIS APPT.- ADDRESS FOR DR. VARNADO  IS [address] - PH. NO. - [phone], PATIENT IS GOOD WITH THIS DATE AND TIME

## 2015-11-19 ENCOUNTER — Other Ambulatory Visit: Payer: Self-pay | Admitting: Family Medicine

## 2015-11-19 LAB — URINE CULTURE

## 2015-11-21 ENCOUNTER — Telehealth: Payer: Self-pay | Admitting: Radiation Oncology

## 2015-11-21 DIAGNOSIS — R300 Dysuria: Secondary | ICD-10-CM

## 2015-11-21 MED ORDER — SULFAMETHOXAZOLE-TRIMETHOPRIM 800-160 MG PO TABS: 1.0000 | ORAL_TABLET | Freq: Two times a day (BID) | ORAL | Status: DC

## 2015-11-21 NOTE — Telephone Encounter (Addendum)
Per Shona Simpson, PA-C order called in Bactrim DS to [facility]. Phoned patient at home. No answer and mailbox full. Phoned patient's cell and left a message.

## 2015-11-21 NOTE — Telephone Encounter (Signed)
-----   Message from Hayden Pedro, [place] sent at 11/18/2015 11:11 AM EDT ----- [facility] pt from yesterday- can you let her know looks like UTI- would give Bactrim DS #10, one po BID for 5 days and we'll wait on culture results. She can also use AZO OTC for pain  ----- Message -----    From: [facility] Interface    Sent: 11/17/2015   4:57 PM      To: Hayden Pedro, PA-C

## 2015-11-21 NOTE — Telephone Encounter (Signed)
Can we refill these? 

## 2015-11-21 NOTE — Telephone Encounter (Signed)
Can we refill this? 

## 2015-11-22 ENCOUNTER — Telehealth: Payer: Self-pay | Admitting: Radiation Oncology

## 2015-11-22 NOTE — Telephone Encounter (Signed)
Phoned patient to inquire about status. Patient confirms she picked up antibiotics as direct and began taking them yesterday. Patient understands to take one tablet twice daily until complete. Advised AZO over the counter could be taken for pain associate with urination. Patient verbalized understanding.  Patient denies any additional needs at this time.

## 2015-11-22 NOTE — Telephone Encounter (Signed)
-----   Message from Hayden Pedro, [place] sent at 11/18/2015 11:11 AM EDT ----- [facility] pt from yesterday- can you let her know looks like UTI- would give Bactrim DS #10, one po BID for 5 days and we'll wait on culture results. She can also use AZO OTC for pain  ----- Message -----    From: [facility] Interface    Sent: 11/17/2015   4:57 PM      To: Hayden Pedro, PA-C

## 2015-11-23 ENCOUNTER — Emergency Department (HOSPITAL_COMMUNITY)
Admission: EM | Admit: 2015-11-23 | Discharge: 2015-11-23 | Disposition: A | Discharge status: Home / Self Care | Payer: Medicare Other | Attending: Emergency Medicine | Admitting: Emergency Medicine

## 2015-11-23 ENCOUNTER — Encounter (HOSPITAL_COMMUNITY): Payer: Self-pay | Admitting: Emergency Medicine

## 2015-11-23 DIAGNOSIS — Z96642 Presence of left artificial hip joint: Secondary | ICD-10-CM | POA: Diagnosis not present

## 2015-11-23 DIAGNOSIS — E119 Type 2 diabetes mellitus without complications: Secondary | ICD-10-CM | POA: Diagnosis not present

## 2015-11-23 DIAGNOSIS — Y939 Activity, unspecified: Secondary | ICD-10-CM | POA: Diagnosis not present

## 2015-11-23 DIAGNOSIS — J449 Chronic obstructive pulmonary disease, unspecified: Secondary | ICD-10-CM | POA: Diagnosis not present

## 2015-11-23 DIAGNOSIS — S91312A Laceration without foreign body, left foot, initial encounter: Secondary | ICD-10-CM | POA: Diagnosis not present

## 2015-11-23 DIAGNOSIS — Z87891 Personal history of nicotine dependence: Secondary | ICD-10-CM | POA: Insufficient documentation

## 2015-11-23 DIAGNOSIS — I509 Heart failure, unspecified: Secondary | ICD-10-CM | POA: Diagnosis not present

## 2015-11-23 DIAGNOSIS — I252 Old myocardial infarction: Secondary | ICD-10-CM | POA: Diagnosis not present

## 2015-11-23 DIAGNOSIS — Z7984 Long term (current) use of oral hypoglycemic drugs: Secondary | ICD-10-CM | POA: Insufficient documentation

## 2015-11-23 DIAGNOSIS — Z7982 Long term (current) use of aspirin: Secondary | ICD-10-CM | POA: Insufficient documentation

## 2015-11-23 DIAGNOSIS — Z79899 Other long term (current) drug therapy: Secondary | ICD-10-CM | POA: Diagnosis not present

## 2015-11-23 DIAGNOSIS — Y9259 Other trade areas as the place of occurrence of the external cause: Secondary | ICD-10-CM | POA: Diagnosis not present

## 2015-11-23 DIAGNOSIS — I2510 Atherosclerotic heart disease of native coronary artery without angina pectoris: Secondary | ICD-10-CM | POA: Insufficient documentation

## 2015-11-23 DIAGNOSIS — W260XXA Contact with knife, initial encounter: Secondary | ICD-10-CM | POA: Insufficient documentation

## 2015-11-23 DIAGNOSIS — Y999 Unspecified external cause status: Secondary | ICD-10-CM | POA: Diagnosis not present

## 2015-11-23 MED ORDER — BACITRACIN ZINC 500 UNIT/GM EX OINT: TOPICAL_OINTMENT | Freq: Two times a day (BID) | CUTANEOUS | Status: DC

## 2015-11-23 NOTE — ED Notes (Signed)
Declined W/C at D/C and was escorted to lobby by RN. 

## 2015-11-23 NOTE — ED Provider Notes (Signed)
History  By signing my name below, I, Bea Graff, attest that this documentation has been prepared under the direction and in the presence of Jemiah Ellenburg, PA-C. Electronically Signed: Bea Graff, ED Scribe. 11/23/2015. 12:47 PM.  Chief Complaint  Patient presents with  . Extremity Laceration   The history is provided by the patient and medical records. No language interpreter was used.    HPI Comments:  Jillian Bright is a 70 y.o. female who presents to the Emergency Department complaining of a laceration to the dorsal left foot that she sustained secondary to dropping a clean kitchen knife on it PTA. Pt reports associated bleeding that has since been controlled. She has not taken anything for pain but has applied pressure and peroxide to resolve the bleeding. She denies modifying factors. She denies numbness, tingling, weakness, or current pain of the left foot or LLE. She reports PMHx of DM and states her CBGs are generally controlled with intermittent elevation. Her last tetanus vaccination was within the past year.   Past Medical History  Diagnosis Date  . Diabetes mellitus   . COPD (chronic obstructive pulmonary disease) ([place])     sees Dr. [name]  . Sleep apnea, obstructive   . Pulmonary HTN ([place])   . Obesity (BMI 30.0-34.9)   . Hyperlipidemia   . Myocardial infarction ([place])   . Coronary artery disease   . Anxiety   . Shortness of breath   . GERD (gastroesophageal reflux disease)   . Arthritis     rheumatoid, sees Dr. Tobie Lords  . CHF (congestive heart failure) ([place])   . Pneumonia   . Bronchitis     hx of   . History of hiatal hernia   . Acute on chronic diastolic (congestive) heart failure ([place]) 09/07/2015  . Aortic stenosis 01/14/2012  . Pleural effusion on right 09/07/2015  . Tricuspid regurgitation   . Right-sided congestive heart failure ([place])   . Mitral regurgitation   . Lymphadenopathy, axillary 11/17/2015   Past Surgical History  Procedure  Laterality Date  . Appendectomy    . Tonsillectomy and adenoidectomy    . Lumbar disc surgery    . Hemorrhoid surgery    . Coronary angioplasty    . Endarterectomy Left 12/04/2012    Procedure: ENDARTERECTOMY CAROTID;  Surgeon: Mal Misty, MD;  Location: [facility];  Service: Vascular;  Laterality: Left;  . Patch angioplasty Left 12/04/2012    Procedure: PATCH ANGIOPLASTY;  Surgeon: Mal Misty, MD;  Location: [facility];  Service: Vascular;  Laterality: Left;  Marland Kitchen Eye surgery      bilat cataract surg   . Colonscopy     . Total hip arthroplasty Left 07/30/2014    Procedure: LEFT TOTAL HIP ARTHROPLASTY ANTERIOR APPROACH;  Surgeon: Meredith Pel, MD;  [facility];  Service: Orthopedics;  Laterality: Left;  . Tee without cardioversion N/A 09/21/2015    Procedure: TRANSESOPHAGEAL ECHOCARDIOGRAM (TEE);  Surgeon: Larey Dresser, MD;  Location: [facility] ENDOSCOPY;  Service: Cardiovascular;  Laterality: N/A;   Family History  Problem Relation Age of Onset  . Hypertension Mother   . Hyperlipidemia Mother   . Stroke Mother   . Heart attack Mother   . Cancer Mother   . Heart attack Father   . Cancer Brother   . Asthma Maternal Grandfather    Social History  Substance Use Topics  . Smoking status: Former Smoker -- 2.00 packs/day for 36 years    Types: Cigarettes  Quit date: 02/02/2013  . Smokeless tobacco: Never Used     Comment: had restarted and quit again 01/27/12   . Alcohol Use: 0.0 oz/week    0 Standard drinks or equivalent per week     Comment: rare   OB History    No data available     Review of Systems  Skin: Positive for wound.  Neurological: Negative for weakness and numbness.    Allergies  Codeine  Home Medications   Prior to Admission medications   Medication Sig Start Date End Date Taking? Authorizing Provider  albuterol (PROVENTIL HFA;VENTOLIN HFA) 108 (90 BASE) MCG/ACT inhaler Inhale 2 puffs into the lungs every 6 (six) hours as needed for wheezing or shortness  of breath. Reported on 11/17/2015    Historical Provider, MD  ALPRAZolam Duanne Moron) 0.5 MG tablet Take 0.5 mg by mouth every 8 (eight) hours as needed for anxiety.    Historical Provider, MD  aspirin 81 MG chewable tablet Chew 1 tablet (81 mg total) by mouth daily. 09/28/15   Mir Marry Guan, MD  atorvastatin (LIPITOR) 20 MG tablet Take 1 tablet (20 mg total) by mouth daily. 12/07/14   [name], MD  budesonide-formoterol [facility]) 160-4.5 MCG/ACT inhaler Inhale 2 puffs into the lungs 2 (two) times daily. 10/26/13   Tammy S Parrett, NP  DULoxetine (CYMBALTA) 60 MG capsule TAKE ONE CAPSULE BY MOUTH ONCE DAILY FOR MOOD 11/21/15   [name], MD  ferrous sulfate 325 (65 FE) MG tablet Take 1 tablet (325 mg total) by mouth 2 (two) times daily with a meal. 09/28/15   Mir Marry Guan, MD  gabapentin (NEURONTIN) 800 MG tablet Take 800 mg by mouth 3 (three) times daily. 10/12/15   Historical Provider, MD  glipiZIDE (GLUCOTROL) 10 MG tablet TAKE 1 TABLET BY MOUTH TWICE A DAY PRIOR TO MEAL 11/21/15   [name], MD  JANUVIA 100 MG tablet TAKE 1 TABLET BY MOUTH EVERY DAY 11/21/15   [name], MD  metFORMIN (GLUCOPHAGE) 500 MG tablet Take 1 tablet (500 mg total) by mouth 3 (three) times daily. 12/07/14   [name], MD  omeprazole (PRILOSEC) 40 MG capsule TAKE ONE CAPSULE BY MOUTH EVERY MORNING 11/21/15   [name], MD  potassium chloride SA (K-DUR,KLOR-CON) 20 MEQ tablet Take 1 tablet (20 mEq total) by mouth daily. 09/28/15   Mir Marry Guan, MD  spironolactone (ALDACTONE) 25 MG tablet Take 1 tablet (25 mg total) by mouth every morning. 12/07/14   [name], MD  sulfamethoxazole-trimethoprim (BACTRIM DS,SEPTRA DS) 800-160 MG tablet Take 1 tablet by mouth 2 (two) times daily. 11/21/15   Hayden Pedro, PA-C  torsemide (DEMADEX) 20 MG tablet Take 3 tablets (60 mg total) by mouth daily. 10/11/15   Shirley Friar, PA-C  traMADol (ULTRAM) 50 MG tablet Take 1 tablet (50 mg  total) by mouth every 6 (six) hours as needed for severe pain. 10/20/15   [name], MD  VESICARE 10 MG tablet GIVE 1OMG BY MOUTH ONE TIME A DAY FOR OVERDRIVEN BLADDER 09/04/79   [name], MD  vitamin B-12 (CYANOCOBALAMIN) 1000 MCG tablet Take 1 tablet (1,000 mcg total) by mouth daily. 11/19/14   Delfina Redwood, MD   Triage Vitals: BP 94/65 mmHg  Pulse 88  Temp(Src) 98.4 F (36.9 C) (Oral)  Resp 20  SpO2 93% Physical Exam  Constitutional: She appears well-developed and well-nourished. No distress.  HENT:  Head: Normocephalic and atraumatic.  Eyes: Conjunctivae are normal.  Neck: Neck supple.  Cardiovascular: Normal rate, regular rhythm and intact distal pulses.   Pulmonary/Chest: Effort normal.  Neurological: She is alert.  Normal sensation in the left foot and toes. Strength is 5 out of 5 in the toes.  Skin: Skin is warm and dry. She is not diaphoretic.  0.5 cm laceration to dorsum of left foot. No active hemorrhage. Edges well approximated.  Psychiatric: She has a normal mood and affect. Her behavior is normal.  Nursing note and vitals reviewed.   ED Course  Procedures (including critical care time) DIAGNOSTIC STUDIES: Oxygen Saturation is 93% on 3 L/min, low by my interpretation.   COORDINATION OF CARE: 12:43 PM- Advised pt to apply OTC Neosporin to area. Will dress wound prior to discharge. Informed pt no suturing is indicated. Pt verbalizes understanding and agrees to plan.   MDM   Final diagnoses:  Foot laceration, left, initial encounter    Wound irrigated, area washed, and wound dressed. The edges of the laceration approximated spontaneously and are unable to be pulled apart, likely due to the superficial nature of the laceration. No active laceration repair necessary at this time. Patient follow up with PCP in next 2-3 days for wound check for proper healing due to her history of diabetes. Wound care and return precautions discussed. Patient voiced  understanding of these instructions and is comfortable with discharge.  I personally performed the services described in this documentation, which was scribed in my presence. The recorded information has been reviewed and is accurate.     Lorayne Bender, PA-C 11/24/15 [phone]  Dorie Rank, MD 11/24/15 [phone]

## 2015-11-23 NOTE — Discharge Instructions (Signed)
You have been seen today for a foot laceration. Refer to the attached literature for proper wound care. Go to your PCP in about 3 days for wound check to ensure proper healing, especially due to her diabetes. Return to ED should symptoms worsen.

## 2015-11-23 NOTE — ED Notes (Signed)
Pt states she was at a drug store and dropped a knife on her left foot. Bleeding was controlled at drug store with sterile gauze. Pt has small cut on top on left foot. Pedal pulses +2 equal bilaterally. Sensation intact.

## 2015-11-24 ENCOUNTER — Telehealth: Payer: Self-pay | Admitting: Family Medicine

## 2015-11-24 NOTE — Telephone Encounter (Signed)
Pt was discharge for hospital and per pt needs post hos fup in 3 day. I have scheduled pt for 11-30-15. Can I create something sooner?

## 2015-11-25 ENCOUNTER — Ambulatory Visit
Admission: RE | Admit: 2015-11-25 | Discharge: 2015-11-25 | Disposition: A | Discharge status: Home / Self Care | Payer: Medicare Other | Source: Ambulatory Visit | Attending: Radiation Oncology | Admitting: Radiation Oncology

## 2015-11-25 VITALS — BP 117/82 | HR 98 | Resp 18 | Ht 66.0 in | Wt 180.4 lb

## 2015-11-25 DIAGNOSIS — C3412 Malignant neoplasm of upper lobe, left bronchus or lung: Secondary | ICD-10-CM | POA: Diagnosis present

## 2015-11-25 DIAGNOSIS — Z510 Encounter for antineoplastic radiation therapy: Secondary | ICD-10-CM | POA: Insufficient documentation

## 2015-11-25 DIAGNOSIS — R911 Solitary pulmonary nodule: Secondary | ICD-10-CM

## 2015-11-25 NOTE — Telephone Encounter (Signed)
Okay to schedule

## 2015-11-25 NOTE — Progress Notes (Signed)
Received patient and her daughter in the clinic for nurse evaluation prior to CT simulation. Vitals and weight WDL. Denies pain. Reports she fell this morning but, is unsure what caused her to loose her footing. She confirms she did not hit her head. Continuous oxygen therapy 3 liters via nasal cannula noted. Reports she has three days of Bactrim left for the management of a UTI. She denies dysuria or hematuria. Describes urine as straw yellow and no longer dark.

## 2015-11-25 NOTE — Telephone Encounter (Signed)
lmom for pt to call back

## 2015-11-25 NOTE — Telephone Encounter (Signed)
Pt has been re-sch

## 2015-11-28 ENCOUNTER — Ambulatory Visit (INDEPENDENT_AMBULATORY_CARE_PROVIDER_SITE_OTHER): Payer: Medicare Other | Admitting: Family Medicine

## 2015-11-28 ENCOUNTER — Encounter: Payer: Self-pay | Admitting: Family Medicine

## 2015-11-28 ENCOUNTER — Telehealth: Payer: Self-pay | Admitting: *Deleted

## 2015-11-28 VITALS — BP 96/62 | HR 109 | Temp 97.7°F | Ht 66.0 in | Wt 176.0 lb

## 2015-11-28 DIAGNOSIS — N179 Acute kidney failure, unspecified: Secondary | ICD-10-CM

## 2015-11-28 DIAGNOSIS — I5033 Acute on chronic diastolic (congestive) heart failure: Secondary | ICD-10-CM | POA: Diagnosis not present

## 2015-11-28 DIAGNOSIS — I272 Other secondary pulmonary hypertension: Secondary | ICD-10-CM

## 2015-11-28 DIAGNOSIS — R911 Solitary pulmonary nodule: Secondary | ICD-10-CM | POA: Diagnosis not present

## 2015-11-28 DIAGNOSIS — I6529 Occlusion and stenosis of unspecified carotid artery: Secondary | ICD-10-CM

## 2015-11-28 DIAGNOSIS — J432 Centrilobular emphysema: Secondary | ICD-10-CM

## 2015-11-28 DIAGNOSIS — E1165 Type 2 diabetes mellitus with hyperglycemia: Secondary | ICD-10-CM | POA: Diagnosis not present

## 2015-11-28 DIAGNOSIS — N390 Urinary tract infection, site not specified: Secondary | ICD-10-CM | POA: Diagnosis not present

## 2015-11-28 DIAGNOSIS — I10 Essential (primary) hypertension: Secondary | ICD-10-CM

## 2015-11-28 DIAGNOSIS — J449 Chronic obstructive pulmonary disease, unspecified: Secondary | ICD-10-CM

## 2015-11-28 MED ORDER — HYDROCODONE-ACETAMINOPHEN 10-325 MG PO TABS: 1.0000 | ORAL_TABLET | Freq: Three times a day (TID) | ORAL | Status: DC | PRN

## 2015-11-28 MED ORDER — ALPRAZOLAM 0.5 MG PO TABS: 0.5000 mg | ORAL_TABLET | Freq: Three times a day (TID) | ORAL | Status: AC | PRN

## 2015-11-28 NOTE — Telephone Encounter (Signed)
Returned  Patient's phone call, lvm for a return call

## 2015-11-28 NOTE — Progress Notes (Signed)
Pre visit review using our clinic review tool, if applicable. No additional management support is needed unless otherwise documented below in the visit note. 

## 2015-11-28 NOTE — Progress Notes (Signed)
   Subjective:    Patient ID: Jillian Bright, female    DOB: 1945/07/13, 70 y.o.   MRN: [id number]  HPI Here to follow up a hospital stay from 09-07-15 to 09-28-15 for an acute exacerbation of CHF with volume overload which also led to acute renal failure. She was diuresed and her cardiac function returned to baseline fairly quickly. She had folow up labs on 11-17-15 which showed a creatinine of 0.9 and a return to normal renal function. Her potassium was stable at 5.1. She was diagnosed with a UTI on 11-17-15 and was treated with Bactrim DS. Culture grew a pan sensitive E coli. She feels better now but still has some mild burning at the initiation of urinations. No urgency or pain. Also while in the hospital attention was paid to a nodule in the left upper lung lobe. A CT scan was highly suspicious for malignancy and this was confirmed by showing the nodule to be hypermetabolic on a PET scan. She had a few hypermetabolic pelvic lymph nodes as well. Dr. Halford Chessman referred her to Dr. 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 Due to the risk involved the decision was made to not seek a needle biopsy of this lesion. She was referred to Dr. Tammi Klippel for radiation therapy, and she is scheduled to have the first session of this on 12-07-15. Dr. Halford Chessman also plans to repeat CT scans of the chest, abdomen, and pelvis in 6 months. She feels tired today and is of course SOB, but she seems to be at her baseline. Using continuous oxygen. Her appetite is good. She has a lot of arthritic joint pains and has tried Tramdol for these, but she gets little relief. Her BP has been stable and her glucoses have been ranging in the 100s to 200s.    Review of Systems  Constitutional: Positive for fatigue. Negative for fever.  Respiratory: Positive for shortness of breath. Negative for cough and wheezing.   Cardiovascular: Negative.   Gastrointestinal: Negative.   Genitourinary: Positive for  dysuria. Negative for urgency, frequency, hematuria, flank pain and difficulty urinating.  Neurological: Negative.        Objective:   Physical Exam  Constitutional: She is oriented to person, place, and time.  She appears frail, walks without assistance   Neck: No thyromegaly present.  Cardiovascular: Normal rate, regular rhythm, normal heart sounds and intact distal pulses.   Pulmonary/Chest: Effort normal and breath sounds normal. No respiratory distress. She has no wheezes. She has no rales.  Musculoskeletal:  2+ edema in both lower legs   Lymphadenopathy:    She has no cervical adenopathy.  Neurological: She is alert and oriented to person, place, and time.          Assessment & Plan:  Her CHF is stable and her renal function is back to normal. Her HTN is stable. We will get an A1c to further evaluate her diabetes control. She will provide another urine sample to see if the UTI has cleared. She will follow up with Dr. Tammi Klippel and Dr. Julien Nordmann for the malignant lung lesion.  [name], MD

## 2015-11-30 ENCOUNTER — Other Ambulatory Visit (INDEPENDENT_AMBULATORY_CARE_PROVIDER_SITE_OTHER): Payer: Medicare Other

## 2015-11-30 ENCOUNTER — Ambulatory Visit: Payer: Medicare Other | Admitting: Family Medicine

## 2015-11-30 DIAGNOSIS — N390 Urinary tract infection, site not specified: Secondary | ICD-10-CM

## 2015-11-30 DIAGNOSIS — E1165 Type 2 diabetes mellitus with hyperglycemia: Secondary | ICD-10-CM

## 2015-11-30 LAB — URINALYSIS, ROUTINE W REFLEX MICROSCOPIC
BILIRUBIN URINE: NEGATIVE
Hgb urine dipstick: NEGATIVE
Ketones, ur: NEGATIVE
LEUKOCYTES UA: NEGATIVE
NITRITE: NEGATIVE
PH: 5.5 (ref 5.0–8.0)
RBC / HPF: NONE SEEN (ref 0–?)
Specific Gravity, Urine: 1.01 (ref 1.000–1.030)
TOTAL PROTEIN, URINE-UPE24: NEGATIVE
URINE GLUCOSE: NEGATIVE
UROBILINOGEN UA: 0.2 (ref 0.0–1.0)
WBC UA: NONE SEEN (ref 0–?)

## 2015-11-30 LAB — HEMOGLOBIN A1C: Hgb A1c MFr Bld: 5.8 % (ref 4.6–6.5)

## 2015-12-03 ENCOUNTER — Other Ambulatory Visit: Payer: Self-pay | Admitting: Internal Medicine

## 2015-12-07 ENCOUNTER — Ambulatory Visit
Admission: RE | Admit: 2015-12-07 | Discharge: 2015-12-07 | Disposition: A | Discharge status: Home / Self Care | Payer: Medicare Other | Source: Ambulatory Visit | Attending: Radiation Oncology | Admitting: Radiation Oncology

## 2015-12-07 DIAGNOSIS — Z510 Encounter for antineoplastic radiation therapy: Secondary | ICD-10-CM | POA: Diagnosis not present

## 2015-12-08 ENCOUNTER — Encounter (HOSPITAL_COMMUNITY): Payer: Medicare Other

## 2015-12-08 ENCOUNTER — Encounter (HOSPITAL_COMMUNITY): Payer: Self-pay

## 2015-12-08 ENCOUNTER — Ambulatory Visit (HOSPITAL_COMMUNITY)
Admission: RE | Admit: 2015-12-08 | Discharge: 2015-12-08 | Disposition: A | Discharge status: Home / Self Care | Payer: Medicare Other | Source: Ambulatory Visit | Attending: Cardiology | Admitting: Cardiology

## 2015-12-08 VITALS — BP 110/62 | HR 91 | Ht 66.0 in | Wt 181.0 lb

## 2015-12-08 DIAGNOSIS — I509 Heart failure, unspecified: Secondary | ICD-10-CM

## 2015-12-08 DIAGNOSIS — I252 Old myocardial infarction: Secondary | ICD-10-CM | POA: Diagnosis not present

## 2015-12-08 DIAGNOSIS — Z823 Family history of stroke: Secondary | ICD-10-CM | POA: Diagnosis not present

## 2015-12-08 DIAGNOSIS — I50810 Right heart failure, unspecified: Secondary | ICD-10-CM

## 2015-12-08 DIAGNOSIS — K219 Gastro-esophageal reflux disease without esophagitis: Secondary | ICD-10-CM | POA: Diagnosis not present

## 2015-12-08 DIAGNOSIS — Z885 Allergy status to narcotic agent status: Secondary | ICD-10-CM | POA: Insufficient documentation

## 2015-12-08 DIAGNOSIS — I5032 Chronic diastolic (congestive) heart failure: Secondary | ICD-10-CM | POA: Insufficient documentation

## 2015-12-08 DIAGNOSIS — N183 Chronic kidney disease, stage 3 (moderate): Secondary | ICD-10-CM | POA: Diagnosis not present

## 2015-12-08 DIAGNOSIS — Z79899 Other long term (current) drug therapy: Secondary | ICD-10-CM | POA: Insufficient documentation

## 2015-12-08 DIAGNOSIS — Z8249 Family history of ischemic heart disease and other diseases of the circulatory system: Secondary | ICD-10-CM | POA: Insufficient documentation

## 2015-12-08 DIAGNOSIS — J449 Chronic obstructive pulmonary disease, unspecified: Secondary | ICD-10-CM | POA: Diagnosis not present

## 2015-12-08 DIAGNOSIS — G4733 Obstructive sleep apnea (adult) (pediatric): Secondary | ICD-10-CM | POA: Diagnosis not present

## 2015-12-08 DIAGNOSIS — E785 Hyperlipidemia, unspecified: Secondary | ICD-10-CM | POA: Insufficient documentation

## 2015-12-08 DIAGNOSIS — Z7982 Long term (current) use of aspirin: Secondary | ICD-10-CM | POA: Diagnosis not present

## 2015-12-08 DIAGNOSIS — I2510 Atherosclerotic heart disease of native coronary artery without angina pectoris: Secondary | ICD-10-CM | POA: Diagnosis not present

## 2015-12-08 DIAGNOSIS — Z9981 Dependence on supplemental oxygen: Secondary | ICD-10-CM | POA: Insufficient documentation

## 2015-12-08 DIAGNOSIS — R911 Solitary pulmonary nodule: Secondary | ICD-10-CM | POA: Diagnosis not present

## 2015-12-08 DIAGNOSIS — Z7984 Long term (current) use of oral hypoglycemic drugs: Secondary | ICD-10-CM | POA: Insufficient documentation

## 2015-12-08 DIAGNOSIS — I082 Rheumatic disorders of both aortic and tricuspid valves: Secondary | ICD-10-CM | POA: Insufficient documentation

## 2015-12-08 DIAGNOSIS — I272 Other secondary pulmonary hypertension: Secondary | ICD-10-CM | POA: Insufficient documentation

## 2015-12-08 DIAGNOSIS — I130 Hypertensive heart and chronic kidney disease with heart failure and stage 1 through stage 4 chronic kidney disease, or unspecified chronic kidney disease: Secondary | ICD-10-CM | POA: Diagnosis not present

## 2015-12-08 DIAGNOSIS — Z87891 Personal history of nicotine dependence: Secondary | ICD-10-CM | POA: Insufficient documentation

## 2015-12-08 DIAGNOSIS — E1122 Type 2 diabetes mellitus with diabetic chronic kidney disease: Secondary | ICD-10-CM | POA: Insufficient documentation

## 2015-12-08 DIAGNOSIS — I5033 Acute on chronic diastolic (congestive) heart failure: Secondary | ICD-10-CM

## 2015-12-08 MED ORDER — TORSEMIDE 20 MG PO TABS: 80.0000 mg | ORAL_TABLET | Freq: Every day | ORAL | Status: AC

## 2015-12-08 NOTE — Patient Instructions (Signed)
Increase Torsemide to '80mg'$  ( 4 tablets) daily.  .Lab work in 10 days ([place])  Follow up with Dr.McLean in 3 weeks

## 2015-12-09 ENCOUNTER — Ambulatory Visit: Payer: Medicare Other | Admitting: Radiation Oncology

## 2015-12-11 NOTE — Progress Notes (Signed)
Patient ID: Jillian Bright, female   DOB: 12/26/1945, 70 y.o.   MRN: [id number]    Advanced Heart Failure Clinic Note   Primary Care: Alysia Penna, MD Primary Cardiologist: Dr. Aundra Dubin   HPI:  70 year old female history of coronary artery disease, congestive heart failure, diabetes mellitus, oxygen dependent COPD, obstructive sleep apnea-intolerant to CPAP, pulmonary hypertension, obesity, hyperlipidemia. She was last seen by Dr. Aundra Dubin in August 2013. She initially presented with peripheral edema in 2009 and had a normal stress test and preserved EF on echocardiogram. She had a right heart catheterization which showed mild/moderate pulmonary hypertension which responded well to oxygen with decreased PA pressure. She had 2-D echocardiogram on 09/08/2015 revealing normal ejection fraction of 60-65% and wall motion. Grade 1 diastolic dysfunction. Moderate to severe aortic stenosis where previously was moderate. Mild MR. Left atrium mildly dilated. Peak PA pressure 61 mmHg. Right atrium moderately to severely dilated. Moderate tricuspid valve regurgitation.  Admitted 09/07/15 - 07/29/17 with complicated hospital course inclduing A/C diastolic HF and COPD exacerbation. Thoracentesis 4/25 with 600 cc bloody fluid out. TEE as below, EF 45-50%. Mod-severe AS. Medical management recommended for her AS. 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  She would be high risk for anesthesia for TAVR.  Discharge weight 165 lbs.  She is now home from the rehab facility.  She has suspected lung cancer but is not a good surgical candidate.  She is undergoing radiation treatment, started yesterday.  She is short of breath walking 100-200 feet, occasional lightheadedness with standing, no chest pain.  Weight is up 4 lbs.   Labs (5/17): K 4.3, creatinine 1.3 Labs (6/17): K 5.1, creatinine 0.9, LFTs normal, HCT 42.4  PMH 1. GERD  2. Hyperlipidemia  3. Hypertension   4. CAD: MI 2002 with PCI to proximal and distal RCA. Adenosine myoview (4/09) with EF 72%, no ischemia or infarction.  5. Diabetes mellitus, type II  6. Chronic diastolic CHF: Echo (6/22) showed EF 60-65% with mild diastolic dysfunction. Echo (12/10) showed mild LVH, EF 60-65%, mild AS (mean gradient 13 mmHg), mildly dilated RV with mild to moderate RV systolic dysfunction, and PASP 59 mmHg (moderate pulmonary hypertension). Echo (11/11): EF 60-65%, mild aortic stenosis with mean gradient 12 mmHg, grade I diastolic dysfunction, normal RV size and systolic function, unable to estimate PA systolic pressure.  - TEE 4/17 with EF 45-50%, D-shaped interventricular septum, severely dilated RV with mild to moderate systolic dysfunction, moderate to severe AS, moderate TR.  7. COPD: Has quit smoking. Chronic home oxygen.  - PFT 06/13/09 FEV1 1.97(76%), FEV1% 73, TLC 5.35(101%), DLCO 65%  8. OSA/obesity-hypoventilation  - PSG 06/19/09 AHI 13  - ABG 06/13/09 7.44/50.6/55 on room air  - Started CPAP in hospital 4/17.  9. Pulmonary hypertension: Suspect secondary to hypoxic pulmonary vasoconstriction (COPD, OHS/OSA), group 3 pulmonary hypertension. RHC (1/11) showed mean RA pressure 15, PA 57/42 mean 41 on room air and PA 43/24 mean 32 on nonrebreather mask, PCWP 15, SVO2 51%, CI 1.9. ANA was negative and V/Q scan did not show evidence for chronic PE. RV appeared improved on 11/11 echo.  10. Aortic stenosis: Moderate to severe. Decided against TAVR during 4/17 admission.  - TEE (4/17) with mean gradient 37 mmHg, AVA 1.0 cm^2.  11. Pulmonary nodule: Enlarging pulmonary nodule concerning for bronchogenic carcinoma, especially with smoking history.  - Thoracentesis on 4/25, bloody fluid sent for cytology, fluid negative for malignant cells. - Needs outpatient  workup of concerning lung mass => PET/CT and pulmonary followup.  - She is now undergoing radiation therapy.  12. CKD: Stage 3.   ROS: All  systems reviewed and negative except as per HPI.    Current Outpatient Prescriptions  Medication Sig Dispense Refill  . albuterol (PROVENTIL HFA;VENTOLIN HFA) 108 (90 BASE) MCG/ACT inhaler Inhale 2 puffs into the lungs every 6 (six) hours as needed for wheezing or shortness of breath. Reported on 11/17/2015    . ALPRAZolam (XANAX) 0.5 MG tablet Take 1 tablet (0.5 mg total) by mouth every 8 (eight) hours as needed for anxiety. 90 tablet 5  . aspirin 81 MG chewable tablet Chew 1 tablet (81 mg total) by mouth daily. 30 tablet 0  . atorvastatin (LIPITOR) 20 MG tablet Take 1 tablet (20 mg total) by mouth daily. 90 tablet 3  . budesonide-formoterol (SYMBICORT) 160-4.5 MCG/ACT inhaler Inhale 2 puffs into the lungs 2 (two) times daily. 1 Inhaler 5  . DULoxetine (CYMBALTA) 60 MG capsule TAKE ONE CAPSULE BY MOUTH ONCE DAILY FOR MOOD 30 capsule 11  . ferrous sulfate 325 (65 FE) MG tablet Take 1 tablet (325 mg total) by mouth 2 (two) times daily with a meal. 90 tablet 0  . gabapentin (NEURONTIN) 800 MG tablet Take 800 mg by mouth 3 (three) times daily.  0  . glipiZIDE (GLUCOTROL) 10 MG tablet TAKE 1 TABLET BY MOUTH TWICE A DAY PRIOR TO MEAL 60 tablet 11  . HYDROcodone-acetaminophen (NORCO) 10-325 MG tablet Take 1 tablet by mouth every 8 (eight) hours as needed for severe pain. 90 tablet 0  . JANUVIA 100 MG tablet TAKE 1 TABLET BY MOUTH EVERY DAY 30 tablet 11  . metFORMIN (GLUCOPHAGE) 500 MG tablet Take 1 tablet (500 mg total) by mouth 3 (three) times daily. 90 tablet 11  . omeprazole (PRILOSEC) 40 MG capsule TAKE ONE CAPSULE BY MOUTH EVERY MORNING 30 capsule 11  . potassium chloride SA (K-DUR,KLOR-CON) 20 MEQ tablet Take 1 tablet (20 mEq total) by mouth daily. 30 tablet 0  . spironolactone (ALDACTONE) 25 MG tablet Take 1 tablet (25 mg total) by mouth every morning. 30 tablet 11  . torsemide (DEMADEX) 20 MG tablet Take 4 tablets (80 mg total) by mouth daily. 120 tablet 3  . VESICARE 10 MG tablet GIVE 1OMG BY  MOUTH ONE TIME A DAY FOR OVERDRIVEN BLADDER 30 tablet 6  . vitamin B-12 (CYANOCOBALAMIN) 1000 MCG tablet Take 1 tablet (1,000 mcg total) by mouth daily. 30 tablet 0  . traMADol (ULTRAM) 50 MG tablet Take 1 tablet (50 mg total) by mouth every 6 (six) hours as needed for severe pain. (Patient not taking: Reported on 12/08/2015) 120 tablet 2   No current facility-administered medications for this encounter.    Allergies  Allergen Reactions  . Codeine Itching    Takes vicodin at home     Social History   Social History  . Marital Status: Widowed    Spouse Name: N/A  . Number of Children: N/A  . Years of Education: N/A   Occupational History  . used to work as a Theme park manager    Social History Main Topics  . Smoking status: Former Smoker -- 2.00 packs/day for 36 years    Types: Cigarettes    Quit date: 02/02/2013  . Smokeless tobacco: Never Used     Comment: had restarted and quit again 01/27/12   . Alcohol Use: 0.0 oz/week    0 Standard drinks or equivalent per week  Comment: rare  . Drug Use: No  . Sexual Activity: No   Other Topics Concern  . Not on file   Social History Narrative   Lives alone      Family History  Problem Relation Age of Onset  . Hypertension Mother   . Hyperlipidemia Mother   . Stroke Mother   . Heart attack Mother   . Cancer Mother   . Heart attack Father   . Cancer Brother   . Asthma Maternal Grandfather     Filed Vitals:   12/08/15 1135  BP: 110/62  Pulse: 91  Height: '5\' 6"'$  (1.676 m)  Weight: 181 lb (82.101 kg)  SpO2: 93%   Wt Readings from Last 3 Encounters:  12/08/15 181 lb (82.101 kg)  11/28/15 176 lb (79.833 kg)  11/25/15 180 lb 6.4 oz (81.829 kg)     PHYSICAL EXAM: General: Well appearing, NAD, in WC HEENT: normal Neck: supple. JVP 8 cm with HJR. Carotids 2+ bilat; no bruits. No thyromegaly or nodule noted. Cor: PMI nondisplaced. RRR. No rubs or gallops. 3/6 crescendo-decrescendo murmur RUSB with muffled S2. Lungs:  RML RLL mildly decreased. On 4 liters oxygen Abdomen: soft, NT, ND, no HSM. No bruits or masses. +BS  Extremities: no cyanosis, clubbing, rash.UNNA boots in place. 1+ chronic edema to knees bilaterally.   Neuro: A&O x 3, cranial nerves grossly intact. moves all 4 extremities w/o difficulty. Affect pleasant   ASSESSMENT & PLAN:  1. Chronic diastolic CHF: Had prominent RV failure in the setting of OHS/OSA during previous admission. TEE 4/27 with EF 45-50%, D-shaped interventricular septum, severely dilated RV with mild to moderate systolic dysfunction, moderate to severe AS. She looks mildly volume overloaded today.  NYHA class III symptoms (stable). - Increase torsemide 80 mg daily. BMET/BNP in 10 days.  - Continue Unna boots.  2. Aortic stenosis: TEE done 4/17, there is AS with mean gradient 37 and AVA 1.0 cm^2 => moderate to severe stenosis (not critical). Suspect that the aortic stenosis may have some hemodynamic significance, her main problem seems to be severe right heart failure. The right heart problems will likely not go away with TAVR, and we would be exposing her to a high risk procedure (may not be able to come off vent).  - For now, continue medical management of her RV failure. 3. Pulmonary hypertension: Suspect her PH is a combination of OHS/OSA and pulmonary venous hypertension from volume overload.  4. OHS/OSA: Should continue to wear CPAP nightly.   5. COPD: Continue chronic 02.No longer smoking.  6. CAD: h/o RCA PCI. Stable.  - Continue ASA 81 and atorvastatin 20 mg daily 7. Tricuspid regurgitation: Moderate on last TEE.  8. Pulmonary nodule: Enlarging pulmonary nodule concerning for bronchogenic carcinoma, especially with smoking history.  She has had workup and is undergoing radiation therapy.  9. CKD stage III:  BMET 10 days.     Loralie Champagne 12/11/2015

## 2015-12-12 ENCOUNTER — Ambulatory Visit
Admission: RE | Admit: 2015-12-12 | Discharge: 2015-12-12 | Disposition: A | Discharge status: Home / Self Care | Payer: Medicare Other | Source: Ambulatory Visit | Attending: Radiation Oncology | Admitting: Radiation Oncology

## 2015-12-12 DIAGNOSIS — Z510 Encounter for antineoplastic radiation therapy: Secondary | ICD-10-CM | POA: Diagnosis not present

## 2015-12-14 ENCOUNTER — Ambulatory Visit: Payer: Medicare Other | Admitting: Radiation Oncology

## 2015-12-15 ENCOUNTER — Ambulatory Visit
Admission: RE | Admit: 2015-12-15 | Discharge: 2015-12-15 | Disposition: A | Discharge status: Home / Self Care | Payer: No Typology Code available for payment source | Source: Ambulatory Visit | Attending: Radiation Oncology | Admitting: Radiation Oncology

## 2015-12-15 ENCOUNTER — Ambulatory Visit
Admission: RE | Admit: 2015-12-15 | Discharge: 2015-12-15 | Disposition: A | Discharge status: Home / Self Care | Payer: Medicare Other | Source: Ambulatory Visit | Attending: Radiation Oncology | Admitting: Radiation Oncology

## 2015-12-15 ENCOUNTER — Encounter: Payer: Self-pay | Admitting: Radiation Oncology

## 2015-12-15 VITALS — BP 88/68 | HR 93 | Temp 97.7°F | Resp 18 | Wt 174.9 lb

## 2015-12-15 DIAGNOSIS — C3412 Malignant neoplasm of upper lobe, left bronchus or lung: Secondary | ICD-10-CM

## 2015-12-15 DIAGNOSIS — Z510 Encounter for antineoplastic radiation therapy: Secondary | ICD-10-CM | POA: Diagnosis not present

## 2015-12-15 NOTE — Progress Notes (Signed)
  Radiation Oncology         [phone]   Name: Jillian Bright MRN: [id number]   Date: 12/15/2015  DOB: August 06, 1945   Weekly Radiation Therapy Management    ICD-9-CM ICD-10-CM   1. 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 Vitals stable. Seven pound weight loss noted by the nurse. She reports a normal "good" appetite. Denies nausea, vomiting, or diarrhea. Denies any pain presently. Denies skin changes within treatment field. Reports her dry cough has come back. Reports shortness of breath with exertion. Denies chest pain. Reports difficulty "getting herself going." Set-up films were reviewed. The chart was checked.  Physical Findings  weight is 174 lb 14.4 oz (79.334 kg). Her oral temperature is 97.7 F (36.5 C). Her blood pressure is 88/68 and her pulse is 93. Her respiration is 18 and oxygen saturation is 94%. . Weight essentially stable.  No significant changes. Oxygen 3L via nasal cannula noted.  Impression The patient is tolerating radiation.  Plan The patient has completed treatment. A one month follow up appointment card was given.      Sheral Apley Tammi Klippel, M.D.  This document serves as a record of services personally performed by Tyler Pita, MD. It was created on his behalf by Darcus Austin, a trained medical scribe. The creation of this record is based on the scribe's personal observations and the provider's statements to them. This document has been checked and approved by the attending provider.

## 2015-12-15 NOTE — Progress Notes (Signed)
Vitals stable. Seven pound weight loss noted. Reports a normal "good" appetite. Denies nausea, vomiting, or diarrhea. Denies any pain presently. Oxygen therapy 3 liters via nasal cannula noted. Denies skin changes within treatment field. Reports her dry cough has come back. Reports shortness of breath with exertion. Denies chest pain. Reports difficulty "getting herself going." One month follow up appointment card given. Patient understands to contact this RN with future needs.    BP 88/68 mmHg  Pulse 93  Temp(Src) 97.7 F (36.5 C) (Oral)  Resp 18  Wt 174 lb 14.4 oz (79.334 kg)  SpO2 94% Wt Readings from Last 3 Encounters:  12/15/15 174 lb 14.4 oz (79.334 kg)  12/08/15 181 lb (82.101 kg)  11/28/15 176 lb (79.833 kg)

## 2015-12-16 ENCOUNTER — Encounter: Payer: Self-pay | Admitting: Radiation Oncology

## 2015-12-16 ENCOUNTER — Other Ambulatory Visit: Payer: Self-pay | Admitting: Family Medicine

## 2015-12-19 ENCOUNTER — Encounter: Payer: Self-pay | Admitting: Radiation Oncology

## 2015-12-19 ENCOUNTER — Ambulatory Visit (HOSPITAL_COMMUNITY)
Admission: RE | Admit: 2015-12-19 | Discharge: 2015-12-19 | Disposition: A | Discharge status: Home / Self Care | Payer: Medicare Other | Source: Ambulatory Visit | Attending: Internal Medicine | Admitting: Internal Medicine

## 2015-12-19 DIAGNOSIS — I5033 Acute on chronic diastolic (congestive) heart failure: Secondary | ICD-10-CM | POA: Diagnosis not present

## 2015-12-19 LAB — BASIC METABOLIC PANEL
ANION GAP: 10 (ref 5–15)
BUN: 19 mg/dL (ref 6–20)
CALCIUM: 9 mg/dL (ref 8.9–10.3)
CO2: 31 mmol/L (ref 22–32)
Chloride: 92 mmol/L — ABNORMAL LOW (ref 101–111)
Creatinine, Ser: 1.01 mg/dL — ABNORMAL HIGH (ref 0.44–1.00)
GFR, EST NON AFRICAN AMERICAN: 55 mL/min — AB (ref >60)
GLUCOSE: 201 mg/dL — AB (ref 65–99)
POTASSIUM: 4.1 mmol/L (ref 3.5–5.1)
SODIUM: 133 mmol/L — AB (ref 135–145)

## 2015-12-19 LAB — BRAIN NATRIURETIC PEPTIDE: B Natriuretic Peptide: 496.1 pg/mL — ABNORMAL HIGH (ref 0.0–100.0)

## 2015-12-20 ENCOUNTER — Other Ambulatory Visit: Payer: Self-pay | Admitting: Family Medicine

## 2015-12-20 ENCOUNTER — Encounter: Payer: Self-pay | Admitting: Radiation Oncology

## 2015-12-21 ENCOUNTER — Encounter: Payer: Self-pay | Admitting: Radiation Oncology

## 2015-12-22 ENCOUNTER — Encounter: Payer: Self-pay | Admitting: Radiation Oncology

## 2015-12-22 NOTE — Progress Notes (Signed)
  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  Volumetric arc fields were employed to deliver 6 MV X-rays.  Image guidance was performed with per fraction cone beam CT prior to treatment under personal MD supervision.  Immobilization was achieved using BodyFix Pillow.  Narrative: The patient tolerated radiation treatment relatively well.   No complaints  Plan: The patient has completed radiation treatment. The patient will return to radiation oncology clinic for routine followup in one month. I advised them to call or return sooner if they have any questions or concerns related to their recovery or treatment. ________________________________  Sheral Apley. Tammi Klippel, M.D.

## 2015-12-30 ENCOUNTER — Ambulatory Visit: Payer: Medicare Other | Admitting: Pulmonary Disease

## 2016-01-03 ENCOUNTER — Telehealth: Payer: Self-pay | Admitting: Family Medicine

## 2016-01-03 DIAGNOSIS — R590 Localized enlarged lymph nodes: Secondary | ICD-10-CM

## 2016-01-03 DIAGNOSIS — R911 Solitary pulmonary nodule: Secondary | ICD-10-CM

## 2016-01-03 NOTE — Telephone Encounter (Signed)
Refill request for Duloxetine 60 mg & Gabepentin 300 mg, send to [facility] a 90 day supply.

## 2016-01-04 NOTE — Telephone Encounter (Signed)
Refill both for 6 months. 

## 2016-01-04 NOTE — Telephone Encounter (Signed)
Can we refill these? 

## 2016-01-05 MED ORDER — GABAPENTIN 800 MG PO TABS: 800.0000 mg | ORAL_TABLET | Freq: Three times a day (TID) | ORAL | 1 refills | Status: AC

## 2016-01-05 MED ORDER — DULOXETINE HCL 60 MG PO CPEP: ORAL_CAPSULE | ORAL | 1 refills | Status: AC

## 2016-01-05 NOTE — Addendum Note (Signed)
Addended by: Aggie Hacker A on: 01/05/2016 10:23 AM   Modules accepted: Orders

## 2016-01-05 NOTE — Addendum Note (Signed)
Addended by: Aggie Hacker A on: 01/05/2016 10:19 AM   Modules accepted: Orders

## 2016-01-05 NOTE — Telephone Encounter (Signed)
Per Dr. Sarajane Jews should be Gabapentin 800 mg. I did send both scripts e-scribe to [facility] and a 90 day supply.

## 2016-01-10 ENCOUNTER — Ambulatory Visit (HOSPITAL_COMMUNITY)
Admission: RE | Admit: 2016-01-10 | Discharge: 2016-01-10 | Disposition: A | Discharge status: Home / Self Care | Payer: Medicare Other | Source: Ambulatory Visit | Attending: Cardiology | Admitting: Cardiology

## 2016-01-10 VITALS — BP 96/62 | HR 102 | Wt 177.0 lb

## 2016-01-10 DIAGNOSIS — I130 Hypertensive heart and chronic kidney disease with heart failure and stage 1 through stage 4 chronic kidney disease, or unspecified chronic kidney disease: Secondary | ICD-10-CM | POA: Insufficient documentation

## 2016-01-10 DIAGNOSIS — I272 Other secondary pulmonary hypertension: Secondary | ICD-10-CM | POA: Insufficient documentation

## 2016-01-10 DIAGNOSIS — G4733 Obstructive sleep apnea (adult) (pediatric): Secondary | ICD-10-CM | POA: Insufficient documentation

## 2016-01-10 DIAGNOSIS — Z9981 Dependence on supplemental oxygen: Secondary | ICD-10-CM | POA: Diagnosis not present

## 2016-01-10 DIAGNOSIS — C3490 Malignant neoplasm of unspecified part of unspecified bronchus or lung: Secondary | ICD-10-CM | POA: Insufficient documentation

## 2016-01-10 DIAGNOSIS — Z7984 Long term (current) use of oral hypoglycemic drugs: Secondary | ICD-10-CM | POA: Insufficient documentation

## 2016-01-10 DIAGNOSIS — Z8249 Family history of ischemic heart disease and other diseases of the circulatory system: Secondary | ICD-10-CM | POA: Insufficient documentation

## 2016-01-10 DIAGNOSIS — Z79899 Other long term (current) drug therapy: Secondary | ICD-10-CM | POA: Diagnosis not present

## 2016-01-10 DIAGNOSIS — K219 Gastro-esophageal reflux disease without esophagitis: Secondary | ICD-10-CM | POA: Insufficient documentation

## 2016-01-10 DIAGNOSIS — I252 Old myocardial infarction: Secondary | ICD-10-CM | POA: Diagnosis not present

## 2016-01-10 DIAGNOSIS — Z7982 Long term (current) use of aspirin: Secondary | ICD-10-CM | POA: Insufficient documentation

## 2016-01-10 DIAGNOSIS — I5032 Chronic diastolic (congestive) heart failure: Secondary | ICD-10-CM | POA: Insufficient documentation

## 2016-01-10 DIAGNOSIS — Z823 Family history of stroke: Secondary | ICD-10-CM | POA: Insufficient documentation

## 2016-01-10 DIAGNOSIS — Z885 Allergy status to narcotic agent status: Secondary | ICD-10-CM | POA: Diagnosis not present

## 2016-01-10 DIAGNOSIS — E785 Hyperlipidemia, unspecified: Secondary | ICD-10-CM | POA: Diagnosis not present

## 2016-01-10 DIAGNOSIS — Z923 Personal history of irradiation: Secondary | ICD-10-CM | POA: Diagnosis not present

## 2016-01-10 DIAGNOSIS — J449 Chronic obstructive pulmonary disease, unspecified: Secondary | ICD-10-CM | POA: Diagnosis not present

## 2016-01-10 DIAGNOSIS — Z87891 Personal history of nicotine dependence: Secondary | ICD-10-CM | POA: Insufficient documentation

## 2016-01-10 DIAGNOSIS — N183 Chronic kidney disease, stage 3 (moderate): Secondary | ICD-10-CM | POA: Diagnosis not present

## 2016-01-10 DIAGNOSIS — I2510 Atherosclerotic heart disease of native coronary artery without angina pectoris: Secondary | ICD-10-CM | POA: Insufficient documentation

## 2016-01-10 DIAGNOSIS — E1122 Type 2 diabetes mellitus with diabetic chronic kidney disease: Secondary | ICD-10-CM | POA: Diagnosis not present

## 2016-01-10 DIAGNOSIS — I350 Nonrheumatic aortic (valve) stenosis: Secondary | ICD-10-CM | POA: Insufficient documentation

## 2016-01-10 NOTE — Patient Instructions (Signed)
Labs today  We will contact you in 2 months to schedule your next appointment.  

## 2016-01-11 NOTE — Progress Notes (Addendum)
Patient ID: Jillian Bright, female   DOB: 1945-11-08, 70 y.o.   MRN: [id number]    Advanced Heart Failure Clinic Note   Primary Care: Alysia Penna, MD Primary Cardiologist: Dr. Aundra Dubin   HPI:  70 year old female history of coronary artery disease, congestive heart failure, diabetes mellitus, oxygen dependent COPD, obstructive sleep apnea-intolerant to CPAP, pulmonary hypertension, obesity, hyperlipidemia. She initially presented with peripheral edema in 2009 and had a normal stress test and preserved EF on echocardiogram. She had a right heart catheterization which showed mild/moderate pulmonary hypertension which responded well to oxygen with decreased PA pressure. She had 2-D echocardiogram on 09/08/2015 revealing normal ejection fraction of 60-65% and wall motion. Grade 1 diastolic dysfunction. Moderate to severe aortic stenosis where previously was moderate. Mild MR. Left atrium mildly dilated. Peak PA pressure 61 mmHg. Right atrium moderately to severely dilated. Moderate tricuspid valve regurgitation.  Admitted 09/07/15 - 11/02/32 with complicated hospital course inclduing A/C diastolic HF and COPD exacerbation. Thoracentesis 4/25 with 600 cc bloody fluid out. TEE as below, EF 45-50%. Mod-severe AS. Medical management recommended for her AS. 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  She would be high risk for anesthesia for TAVR.  Discharge weight 165 lbs.  She has completed radiation therapy for lung cancer.  She is short of breath walking 50-75 feet chronically, occasional lightheadedness with standing, no chest pain.  Weight is down 4 lbs. She is able to do her own shopping.   Labs (5/17): K 4.3, creatinine 1.3 Labs (6/17): K 5.1, creatinine 0.9, LFTs normal, HCT 42.4 Labs (7/17): K 4.1, creatinine 1.01, BNP 496  PMH 1. GERD  2. Hyperlipidemia  3. Hypertension  4. CAD: MI 2002 with PCI to proximal and distal RCA. Adenosine myoview  (4/09) with EF 72%, no ischemia or infarction.  5. Diabetes mellitus, type II  6. Chronic diastolic CHF: Echo (1/96) showed EF 60-65% with mild diastolic dysfunction. Echo (12/10) showed mild LVH, EF 60-65%, mild AS (mean gradient 13 mmHg), mildly dilated RV with mild to moderate RV systolic dysfunction, and PASP 59 mmHg (moderate pulmonary hypertension). Echo (11/11): EF 60-65%, mild aortic stenosis with mean gradient 12 mmHg, grade I diastolic dysfunction, normal RV size and systolic function, unable to estimate PA systolic pressure.  - TEE 4/17 with EF 45-50%, D-shaped interventricular septum, severely dilated RV with mild to moderate systolic dysfunction, moderate to severe AS, moderate TR.  7. COPD: Has quit smoking. Chronic home oxygen.  - PFT 06/13/09 FEV1 1.97(76%), FEV1% 73, TLC 5.35(101%), DLCO 65%  8. OSA/obesity-hypoventilation  - PSG 06/19/09 AHI 13  - ABG 06/13/09 7.44/50.6/55 on room air  - Started CPAP in hospital 4/17.  9. Pulmonary hypertension: Suspect secondary to hypoxic pulmonary vasoconstriction (COPD, OHS/OSA), group 3 pulmonary hypertension. RHC (1/11) showed mean RA pressure 15, PA 57/42 mean 41 on room air and PA 43/24 mean 32 on nonrebreather mask, PCWP 15, SVO2 51%, CI 1.9. ANA was negative and V/Q scan did not show evidence for chronic PE. RV appeared improved on 11/11 echo.  10. Aortic stenosis: Moderate to severe. Decided against TAVR during 4/17 admission.  - TEE (4/17) with mean gradient 37 mmHg, AVA 1.0 cm^2.  11. Lung cancer:Thoracentesis on 4/25, bloody fluid sent for cytology, fluid negative for malignant cells.  She was deemed too high risk for surgery.  She completed radiation therapy.  12. CKD: Stage 3.   ROS: All systems reviewed and negative except as per HPI.  Current Outpatient Prescriptions  Medication Sig Dispense Refill  . albuterol (PROVENTIL HFA;VENTOLIN HFA) 108 (90 BASE) MCG/ACT inhaler Inhale 2 puffs into the lungs every 6 (six)  hours as needed for wheezing or shortness of breath. Reported on 11/17/2015    . ALPRAZolam (XANAX) 0.5 MG tablet Take 1 tablet (0.5 mg total) by mouth every 8 (eight) hours as needed for anxiety. 90 tablet 5  . aspirin 81 MG chewable tablet Chew 1 tablet (81 mg total) by mouth daily. 30 tablet 0  . atorvastatin (LIPITOR) 20 MG tablet Take 1 tablet (20 mg total) by mouth daily. 90 tablet 3  . budesonide-formoterol (SYMBICORT) 160-4.5 MCG/ACT inhaler Inhale 2 puffs into the lungs 2 (two) times daily. 1 Inhaler 5  . DULoxetine (CYMBALTA) 60 MG capsule TAKE ONE CAPSULE BY MOUTH ONCE DAILY FOR MOOD 90 capsule 1  . ferrous sulfate 325 (65 FE) MG tablet Take 1 tablet (325 mg total) by mouth 2 (two) times daily with a meal. 90 tablet 0  . gabapentin (NEURONTIN) 800 MG tablet Take 1 tablet (800 mg total) by mouth 3 (three) times daily. 270 tablet 1  . glipiZIDE (GLUCOTROL) 10 MG tablet TAKE 1 TABLET BY MOUTH TWICE A DAY PRIOR TO MEAL 60 tablet 11  . HYDROcodone-acetaminophen (NORCO) 10-325 MG tablet Take 1 tablet by mouth every 8 (eight) hours as needed for severe pain. 90 tablet 0  . JANUVIA 100 MG tablet TAKE 1 TABLET BY MOUTH EVERY DAY 30 tablet 11  . KLOR-CON M20 20 MEQ tablet TAKE 1 TABLET BY MOUTH ONCE DAILY FOR CONGESTIVE HEART FAILURE 30 tablet 11  . metFORMIN (GLUCOPHAGE) 500 MG tablet Take 1 tablet (500 mg total) by mouth 3 (three) times daily. 90 tablet 11  . omeprazole (PRILOSEC) 40 MG capsule TAKE ONE CAPSULE BY MOUTH EVERY MORNING 30 capsule 11  . spironolactone (ALDACTONE) 25 MG tablet Take 1 tablet (25 mg total) by mouth every morning. 30 tablet 11  . torsemide (DEMADEX) 20 MG tablet Take 4 tablets (80 mg total) by mouth daily. 120 tablet 3  . traMADol (ULTRAM) 50 MG tablet Take 1 tablet (50 mg total) by mouth every 6 (six) hours as needed for severe pain. 120 tablet 2  . VESICARE 10 MG tablet GIVE 1OMG BY MOUTH ONE TIME A DAY FOR OVERDRIVEN BLADDER 30 tablet 6  . vitamin B-12  (CYANOCOBALAMIN) 1000 MCG tablet Take 1 tablet (1,000 mcg total) by mouth daily. 30 tablet 0   No current facility-administered medications for this encounter.     Allergies  Allergen Reactions  . Codeine Itching    Takes vicodin at home     Social History   Social History  . Marital status: Widowed    Spouse name: N/A  . Number of children: N/A  . Years of education: N/A   Occupational History  . used to work as a Theme park manager Retired   Social History Main Topics  . Smoking status: Former Smoker    Packs/day: 2.00    Years: 36.00    Types: Cigarettes    Quit date: 02/02/2013  . Smokeless tobacco: Never Used     Comment: had restarted and quit again 01/27/12   . Alcohol use 0.0 oz/week     Comment: rare  . Drug use: No  . Sexual activity: No   Other Topics Concern  . Not on file   Social History Narrative   Lives alone      Family History  Problem Relation Age  of Onset  . Hypertension Mother   . Hyperlipidemia Mother   . Stroke Mother   . Heart attack Mother   . Cancer Mother   . Heart attack Father   . Cancer Brother   . Asthma Maternal Grandfather     Vitals:   01/10/16 1413  BP: 96/62  Pulse: (!) 102  SpO2: 96%  Weight: 177 lb (80.3 kg)   Wt Readings from Last 3 Encounters:  01/10/16 177 lb (80.3 kg)  12/15/15 174 lb 14.4 oz (79.3 kg)  12/08/15 181 lb (82.1 kg)     PHYSICAL EXAM: General: Well appearing, NAD, in WC HEENT: normal Neck: supple. JVP 7 cm. Carotids 2+ bilat; no bruits. No thyromegaly or nodule noted. Cor: PMI nondisplaced. Distant heart sounds.  RRR. No rubs or gallops. 2/6 crescendo-decrescendo murmur RUSB with clear S2. Lungs: Decreased breath sounds bilaterally. On 4 liters oxygen Abdomen: soft, NT, ND, no HSM. No bruits or masses. +BS  Extremities: no cyanosis, clubbing, rash.UNNA boots in place. Trace edema.   Neuro: A&O x 3, cranial nerves grossly intact. moves all 4 extremities w/o difficulty. Affect  pleasant   ASSESSMENT & PLAN:  1. Chronic diastolic CHF: Had prominent RV failure in the setting of OHS/OSA during previous admission. TEE 4/27 with EF 45-50%, D-shaped interventricular septum, severely dilated RV with mild to moderate systolic dysfunction, moderate to severe AS. She does not look volume overloaded today.  NYHA class III symptoms (stable). - Continue torsemide 80 mg daily.  BMET/BNP today.   2. Aortic stenosis: TEE done 4/17, there is AS with mean gradient 37 and AVA 1.0 cm^2 => moderate to severe stenosis (not critical). Suspect that the aortic stenosis may have some hemodynamic significance, her main problem seems to be severe right heart failure. The right heart problems will likely not go away with TAVR, and we would be exposing her to a high risk procedure (may not be able to come off vent).  - For now, continue medical management of her RV failure. 3. Pulmonary hypertension: Suspect her PH is due to a combination of OHS/OSA and pulmonary venous hypertension from volume overload.  4. OHS/OSA: Should continue to wear CPAP nightly.   5. COPD: Continue chronic 02.No longer smoking.  6. CAD: h/o RCA PCI. Stable.  - Continue ASA 81 and atorvastatin 20 mg daily 7. Tricuspid regurgitation: Moderate on last TEE.  8. Lung cancer:  She has completed radiation therapy.   9. CKD stage III:  BMET today.   Followup in 2 months.     Loralie Champagne 01/11/2016

## 2016-01-24 ENCOUNTER — Ambulatory Visit: Payer: Self-pay | Admitting: Radiation Oncology

## 2016-02-07 ENCOUNTER — Ambulatory Visit
Admission: RE | Admit: 2016-02-07 | Discharge: 2016-02-07 | Disposition: A | Discharge status: Home / Self Care | Payer: No Typology Code available for payment source | Source: Ambulatory Visit | Attending: Radiation Oncology | Admitting: Radiation Oncology

## 2016-02-20 ENCOUNTER — Encounter: Payer: Self-pay | Admitting: Radiation Oncology

## 2016-02-20 ENCOUNTER — Ambulatory Visit
Admission: RE | Admit: 2016-02-20 | Discharge: 2016-02-20 | Disposition: A | Discharge status: Home / Self Care | Payer: Medicare Other | Source: Ambulatory Visit | Attending: Radiation Oncology | Admitting: Radiation Oncology

## 2016-02-20 VITALS — BP 116/79 | HR 96 | Temp 98.4°F | Ht 66.0 in | Wt 176.8 lb

## 2016-02-20 DIAGNOSIS — Z7984 Long term (current) use of oral hypoglycemic drugs: Secondary | ICD-10-CM | POA: Diagnosis not present

## 2016-02-20 DIAGNOSIS — Z885 Allergy status to narcotic agent status: Secondary | ICD-10-CM | POA: Insufficient documentation

## 2016-02-20 DIAGNOSIS — Z923 Personal history of irradiation: Secondary | ICD-10-CM | POA: Diagnosis not present

## 2016-02-20 DIAGNOSIS — C3412 Malignant neoplasm of upper lobe, left bronchus or lung: Secondary | ICD-10-CM | POA: Insufficient documentation

## 2016-02-20 DIAGNOSIS — Z7982 Long term (current) use of aspirin: Secondary | ICD-10-CM | POA: Diagnosis not present

## 2016-02-20 DIAGNOSIS — J449 Chronic obstructive pulmonary disease, unspecified: Secondary | ICD-10-CM | POA: Diagnosis not present

## 2016-02-20 LAB — BASIC METABOLIC PANEL
Anion Gap: 10 mEq/L (ref 3–11)
BUN: 21.5 mg/dL (ref 7.0–26.0)
CHLORIDE: 91 meq/L — AB (ref 98–109)
CO2: 37 mEq/L — ABNORMAL HIGH (ref 22–29)
Calcium: 9.2 mg/dL (ref 8.4–10.4)
Creatinine: 1.1 mg/dL (ref 0.6–1.1)
EGFR: 52 mL/min/{1.73_m2} — ABNORMAL LOW (ref >90)
GLUCOSE: 111 mg/dL (ref 70–140)
POTASSIUM: 4.5 meq/L (ref 3.5–5.1)
Sodium: 138 mEq/L (ref 136–145)

## 2016-02-20 NOTE — Progress Notes (Signed)
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  She did well in tolerating her radiotherapy without symptoms.                              On review of systems, the patient states she is doing pretty well overall. She denies fevers, chills, chest pain, progressive shortness of breath. No other complaints are noted.  ALLERGIES:  is allergic to codeine.  Meds: Current Outpatient Prescriptions  Medication Sig Dispense Refill  . albuterol (PROVENTIL HFA;VENTOLIN HFA) 108 (90 BASE) MCG/ACT inhaler Inhale 2 puffs into the lungs every 6 (six) hours as needed for wheezing or shortness of breath. Reported on 11/17/2015    . ALPRAZolam (XANAX) 0.5 MG tablet Take 1 tablet (0.5 mg total) by mouth every 8 (eight) hours as needed for anxiety. 90 tablet 5  . aspirin 81 MG chewable tablet Chew 1 tablet (81 mg total) by mouth daily. 30 tablet 0  . atorvastatin (LIPITOR) 20 MG tablet Take 1 tablet (20 mg total) by mouth daily. 90 tablet 3  . budesonide-formoterol (SYMBICORT) 160-4.5 MCG/ACT inhaler Inhale 2 puffs into the lungs 2 (two) times daily. 1 Inhaler 5  . DULoxetine (CYMBALTA) 60 MG capsule TAKE ONE CAPSULE BY MOUTH ONCE DAILY FOR MOOD 90 capsule 1  . gabapentin (NEURONTIN) 800 MG tablet Take 1 tablet (800 mg total) by mouth 3 (three) times daily. 270 tablet 1  . glipiZIDE (GLUCOTROL) 10 MG tablet TAKE 1 TABLET BY MOUTH TWICE A DAY PRIOR TO MEAL 60 tablet 11  . JANUVIA 100 MG tablet TAKE 1 TABLET BY MOUTH EVERY DAY 30 tablet 11  . KLOR-CON M20 20 MEQ tablet TAKE 1  TABLET BY MOUTH ONCE DAILY FOR CONGESTIVE HEART FAILURE 30 tablet 11  . metFORMIN (GLUCOPHAGE) 500 MG tablet Take 1 tablet (500 mg total) by mouth 3 (three) times daily. 90 tablet 11  . omeprazole (PRILOSEC) 40 MG capsule TAKE ONE CAPSULE BY MOUTH EVERY MORNING 30 capsule 11  . spironolactone (ALDACTONE) 25 MG tablet Take 1 tablet (25 mg total) by mouth every morning. 30 tablet 11  . torsemide (DEMADEX) 20 MG tablet Take 4 tablets (80 mg total) by mouth daily. 120 tablet 3  . traMADol (ULTRAM) 50 MG tablet Take 1 tablet (50 mg total) by mouth every 6 (six) hours as needed for severe pain. 120 tablet 2  . VESICARE 10 MG tablet GIVE 1OMG BY MOUTH ONE TIME A DAY FOR OVERDRIVEN BLADDER 30 tablet 6  . ferrous sulfate 325 (65 FE) MG tablet Take 1 tablet (325 mg total) by mouth 2 (two) times daily with a meal. (Patient not taking: Reported on 02/20/2016) 90 tablet 0  . HYDROcodone-acetaminophen (NORCO) 10-325 MG tablet Take 1 tablet by mouth every 8 (eight) hours as needed for severe pain. (Patient not taking: Reported on 02/20/2016) 90 tablet 0  . vitamin B-12 (CYANOCOBALAMIN) 1000 MCG tablet Take 1 tablet (1,000 mcg total) by mouth daily. (  Patient not taking: Reported on 02/20/2016) 30 tablet 0   No current facility-administered medications for this encounter.     Physical Findings:  height is '5\' 6"'$  (1.676 m) and weight is 176 lb 12.8 oz (80.2 kg). Her oral temperature is 98.4 F (36.9 C). Her blood pressure is 116/79 and her pulse is 96. Her oxygen saturation is 97%.  she is on 3L [place] continuously, and 4L at night. In general this is a well appearing caucasian female in no acute distress. She's alert and oriented x4 and appropriate throughout the examination. Cardiopulmonary assessment is negative for acute distress and she exhibits normal effort.   Lab Findings: Lab Results  Component Value Date   WBC 7.6 11/17/2015   HGB 12.8 11/17/2015   HCT 42.4 11/17/2015   MCV 86.9 11/17/2015   PLT 209  11/17/2015     Radiographic Findings: No results found.  Impression/Plan: 1. 70 y.o.  woman with 13 mm putative left upper lobe cancer ([facility]) stage I NSCLC. The patient has recovered well with radiotherapy. We discussed the role for serial CT imaging. I will schedule her first baseline scan as well as labs to determine her ability to receive contrast. 2. COPD. The patient will follow up with Dr. Halford Chessman as well for management of this.      [name], PAC

## 2016-02-20 NOTE — Progress Notes (Addendum)
Ms. Jillian Bright here for reassessment S/P XRT - SBRT to her left upper lobe.  O2 at 3 liters/min during the day and 4 liters at bedtime..  VSS  She reports episodes of chest pain in the mid chest, but none at the present time. Denies need for naps during the day.  Heart attack in 2002.  Gait slow.  Accompanied by a friend today.   BP 116/79 (BP Location: Right Arm, Patient Position: Sitting, Cuff Size: Normal)   Pulse 96   Temp 98.4 F (36.9 C) (Oral)   Ht '5\' 6"'$  (1.676 m)   Wt 176 lb 12.8 oz (80.2 kg)   SpO2 97%   BMI 28.54 kg/m    Wt Readings from Last 3 Encounters:  02/20/16 176 lb 12.8 oz (80.2 kg)  01/10/16 177 lb (80.3 kg)  12/15/15 174 lb 14.4 oz (79.3 kg)

## 2016-02-20 NOTE — Addendum Note (Signed)
Encounter addended by: Benn Moulder, RN on: 02/20/2016  5:24 PM<BR>    Actions taken: Charge Capture section accepted

## 2016-02-21 ENCOUNTER — Telehealth: Payer: Self-pay | Admitting: *Deleted

## 2016-02-21 ENCOUNTER — Encounter: Payer: Self-pay | Admitting: *Deleted

## 2016-02-21 NOTE — Progress Notes (Signed)
Called Jillian Bright to inform about her kidney function test results that they were fine.  Unable to leave a message voice mail full able to leave a call back number for her pager [phone].

## 2016-02-21 NOTE — Telephone Encounter (Signed)
CALLED PATIENT TO INFORM OF CT ON 02-24-16, ARRIVAL TIME - 12:45 PM @ [facility], NPO 4 HRS. PRIOR TO TEST, UNABLE TO LEAVE MESSAGE

## 2016-02-21 NOTE — Progress Notes (Signed)
Spoke with Mrs. Jillian Bright let her know that her kidney function test results were fine for her CT scan per Shona Simpson, P.A. And she is scheduled for 02-24-16 at 1300 to arrive at 1245 to Radiology department at [facility] and the procedure takes 3  Minutes.  Do not eat or drink 4 hours before the procedure. [name] repeated the instructions back.

## 2016-02-24 ENCOUNTER — Encounter (HOSPITAL_COMMUNITY): Payer: Self-pay

## 2016-02-24 ENCOUNTER — Ambulatory Visit (HOSPITAL_COMMUNITY)
Admission: RE | Admit: 2016-02-24 | Discharge: 2016-02-24 | Disposition: A | Discharge status: Home / Self Care | Payer: Medicare Other | Source: Ambulatory Visit | Attending: Radiation Oncology | Admitting: Radiation Oncology

## 2016-02-24 DIAGNOSIS — R590 Localized enlarged lymph nodes: Secondary | ICD-10-CM | POA: Diagnosis not present

## 2016-02-24 DIAGNOSIS — R911 Solitary pulmonary nodule: Secondary | ICD-10-CM | POA: Insufficient documentation

## 2016-02-24 DIAGNOSIS — Z923 Personal history of irradiation: Secondary | ICD-10-CM | POA: Insufficient documentation

## 2016-02-24 DIAGNOSIS — C3412 Malignant neoplasm of upper lobe, left bronchus or lung: Secondary | ICD-10-CM | POA: Diagnosis not present

## 2016-02-24 HISTORY — DX: Malignant (primary) neoplasm, unspecified: C80.1

## 2016-02-24 MED ORDER — IOPAMIDOL (ISOVUE-300) INJECTION 61%
75.0000 mL | Freq: Once | INTRAVENOUS | Status: AC | PRN
  Administered 2016-02-24: 75 mL via INTRAVENOUS

## 2016-02-27 ENCOUNTER — Telehealth: Payer: Self-pay | Admitting: Radiation Oncology

## 2016-02-27 DIAGNOSIS — C3412 Malignant neoplasm of upper lobe, left bronchus or lung: Secondary | ICD-10-CM

## 2016-02-27 NOTE — Telephone Encounter (Signed)
LM for pt to return my call to review imaging.

## 2016-03-01 ENCOUNTER — Telehealth: Payer: Self-pay | Admitting: Family Medicine

## 2016-03-01 NOTE — Telephone Encounter (Signed)
Pt is still having leg and knee pain and would like something stronger than hydrocodone

## 2016-03-02 ENCOUNTER — Telehealth: Payer: Self-pay

## 2016-03-02 NOTE — Telephone Encounter (Signed)
Pt called asking for refill on her Noco 10/325 mg Last OV was on 11/28/15 Pt states she is still having leg pain please advise.

## 2016-03-05 MED ORDER — HYDROCODONE-ACETAMINOPHEN 10-325 MG PO TABS: 1.0000 | ORAL_TABLET | Freq: Three times a day (TID) | ORAL | 0 refills | Status: AC | PRN

## 2016-03-05 NOTE — Telephone Encounter (Signed)
done

## 2016-03-05 NOTE — Telephone Encounter (Signed)
Script is ready for pick up here at front office and I tried to reach pt and no answer.

## 2016-03-06 ENCOUNTER — Telehealth: Payer: Self-pay | Admitting: *Deleted

## 2016-03-06 NOTE — Telephone Encounter (Signed)
CALLED PATIENT TO INFORM OF CT FOR 03-13-16- ARRIVAL TIME -1:45 PM @ [facility], PT. TO HAVE CLEAR LIQUIDS ONLY 4 HRS. PRIOR TO TEST, NO ANSWER WILL CALL LATER

## 2016-03-09 ENCOUNTER — Encounter: Payer: Self-pay | Admitting: Pulmonary Disease

## 2016-03-09 ENCOUNTER — Ambulatory Visit (INDEPENDENT_AMBULATORY_CARE_PROVIDER_SITE_OTHER): Payer: Medicare Other | Admitting: Pulmonary Disease

## 2016-03-09 VITALS — BP 110/78 | HR 91 | Ht 66.0 in | Wt 180.0 lb

## 2016-03-09 DIAGNOSIS — Z23 Encounter for immunization: Secondary | ICD-10-CM | POA: Diagnosis not present

## 2016-03-09 DIAGNOSIS — I6529 Occlusion and stenosis of unspecified carotid artery: Secondary | ICD-10-CM

## 2016-03-09 DIAGNOSIS — J449 Chronic obstructive pulmonary disease, unspecified: Secondary | ICD-10-CM | POA: Diagnosis not present

## 2016-03-09 DIAGNOSIS — J9611 Chronic respiratory failure with hypoxia: Secondary | ICD-10-CM | POA: Diagnosis not present

## 2016-03-09 NOTE — Addendum Note (Signed)
Addended by: Mathis Dad on: 03/09/2016 04:13 PM   Modules accepted: Orders

## 2016-03-09 NOTE — Patient Instructions (Signed)
Try using mucinex twice per day Can try over the counter antihistamine (such as allegra, claritin, or zyrtec) Influenza and Prevnar shots today  Follow up in 4 months

## 2016-03-09 NOTE — Progress Notes (Signed)
Current Outpatient Prescriptions on File Prior to Visit  Medication Sig  . albuterol (PROVENTIL HFA;VENTOLIN HFA) 108 (90 BASE) MCG/ACT inhaler Inhale 2 puffs into the lungs every 6 (six) hours as needed for wheezing or shortness of breath. Reported on 11/17/2015  . ALPRAZolam (XANAX) 0.5 MG tablet Take 1 tablet (0.5 mg total) by mouth every 8 (eight) hours as needed for anxiety.  Marland Kitchen aspirin 81 MG chewable tablet Chew 1 tablet (81 mg total) by mouth daily.  Marland Kitchen atorvastatin (LIPITOR) 20 MG tablet Take 1 tablet (20 mg total) by mouth daily.  . budesonide-formoterol (SYMBICORT) 160-4.5 MCG/ACT inhaler Inhale 2 puffs into the lungs 2 (two) times daily.  . DULoxetine (CYMBALTA) 60 MG capsule TAKE ONE CAPSULE BY MOUTH ONCE DAILY FOR MOOD  . ferrous sulfate 325 (65 FE) MG tablet Take 1 tablet (325 mg total) by mouth 2 (two) times daily with a meal.  . gabapentin (NEURONTIN) 800 MG tablet Take 1 tablet (800 mg total) by mouth 3 (three) times daily.  Marland Kitchen glipiZIDE (GLUCOTROL) 10 MG tablet TAKE 1 TABLET BY MOUTH TWICE A DAY PRIOR TO MEAL  . HYDROcodone-acetaminophen (NORCO) 10-325 MG tablet Take 1 tablet by mouth every 8 (eight) hours as needed for severe pain.  Marland Kitchen JANUVIA 100 MG tablet TAKE 1 TABLET BY MOUTH EVERY DAY  . KLOR-CON M20 20 MEQ tablet TAKE 1 TABLET BY MOUTH ONCE DAILY FOR CONGESTIVE HEART FAILURE  . metFORMIN (GLUCOPHAGE) 500 MG tablet Take 1 tablet (500 mg total) by mouth 3 (three) times daily.  Marland Kitchen omeprazole (PRILOSEC) 40 MG capsule TAKE ONE CAPSULE BY MOUTH EVERY MORNING  . spironolactone (ALDACTONE) 25 MG tablet Take 1 tablet (25 mg total) by mouth every morning.  . torsemide (DEMADEX) 20 MG tablet Take 4 tablets (80 mg total) by mouth daily.  . traMADol (ULTRAM) 50 MG tablet Take 1 tablet (50 mg total) by mouth every 6 (six) hours as needed for severe pain.  . VESICARE 10 MG tablet GIVE 1OMG BY MOUTH ONE TIME A DAY FOR OVERDRIVEN BLADDER  . vitamin B-12 (CYANOCOBALAMIN) 1000 MCG tablet Take 1  tablet (1,000 mcg total) by mouth daily.   No current facility-administered medications on file prior to visit.     Chief Complaint  Patient presents with  . Follow-up    Pt c/o cough - worse at night. Mucus is light to dark in color and at times thick. Requests a cough suppressant.  Wants flu vaccine today.     Pulmonary tests PFT 06/13/09 >> FEV1 1.97(76%), FEV1% 73, TLC 5.35(101%), DLCO 65% ABG 06/13/09 >> 7.44/50.6/55 on room air  PFT 12/25/11 >> FEV1 1.17 (52%), FEV1% 58, TLC 3.67 (70%), DLCO 50%, +BD Spirometry 09/16/15 >> FEV1 1.12 (46%), FEV1% 64  Chest imaging CT chest 12/31/11 >> emphysema, 1.5 cm precarinal node CT cervical spine 01/03/12 >> 11 x 8 mm LUL nodule CT chest 06/30/12 >> 1.3 cm precarinal node no change PET scan 11/08/15 >> 13 mm LUL nodule 4.2 SUV CT chest 02/24/16 >> 7 mm LUL nodule  Sleep tests PSG 06/19/09 >> AHI 13   Cardiac tests Echo 09/21/15 >> EF 45 to 50%, mod/severe AS, mild/mod MR, mod/severe RA dilation  Past medical history DM, HLD, CAD, Anxiety, GERD, HH  Past surgical history, Family history, Social history, Allergies reviewed  Vital signs BP 110/78 (BP Location: Left Arm, Cuff Size: Normal)   Pulse 91   Ht '5\' 6"'$  (1.676 m)   Wt 180 lb (81.6 kg)   SpO2  90%   BMI 29.05 kg/m    History of Present Illness: Jillian Bright is a 70 y.o. female former smoker with COPD, and OSA/OHS unable to tolerate CPAP.  She has more trouble with allergies.  She gets cough with clear sputum.  Denies chest pain, fever, wheeze, or hemoptysis.  Using 3 to 4 liters oxygen.  Finished XRT last Summer.   Physical Exam:  General - No distress ENT - no sinus tenderness, no oral exudate Cardiac - s1s2 regular, 2/6 SM Chest - no wheeze/rales Back - no focal tenderness Abd - soft, non-tender Ext - no edema Neuro - normal strength Skin - no rashes Psych - normal mood, and behavior   Assessment/Plan:  COPD with chronic bronchitis. - continue sybmicort  and prn ventolin - she can use mucinex, antihistamine for allergies - flu and prevnar shots today  Chronic respiratory failure 2nd to COPD and Obesity hypoventilation syndrome. - continue 3 liters oxygen during day, 4 liters at night   Patient Instructions  Try using mucinex twice per day Can try over the counter antihistamine (such as allegra, claritin, or zyrtec) Influenza and Prevnar shots today  Follow up in 4 months    [facility] Pager:  [phone] 03/09/2016, 4:00 PM

## 2016-03-13 ENCOUNTER — Ambulatory Visit (HOSPITAL_COMMUNITY): Admission: RE | Admit: 2016-03-13 | Payer: Medicare Other | Source: Ambulatory Visit

## 2016-03-28 ENCOUNTER — Telehealth: Payer: Self-pay | Admitting: Internal Medicine

## 2016-03-29 ENCOUNTER — Telehealth: Payer: Self-pay

## 2016-03-29 NOTE — Telephone Encounter (Signed)
Call from EMS received by [facility]. Pt was found deceased in her home. Suspect natural causes. Dr. Derrel Nip and Dr. Larose Kells notified.   Dr. Sarajane Jews - FYI as death certificate will be sent to you. Thanks.

## 2016-04-27 NOTE — Telephone Encounter (Signed)
Phone call from EMS, pt was found death at home, they needed to notify PCP; answering service unable to locate doctor on call so they called me. They suspect natural causes. Will pass the information to PCP as they need somebody to sign the death certificate

## 2016-04-27 DEATH — deceased

## 2016-05-09 ENCOUNTER — Other Ambulatory Visit: Payer: Medicare Other

## 2016-05-16 ENCOUNTER — Ambulatory Visit: Payer: Medicare Other | Admitting: Internal Medicine

## 2016-06-16 IMAGING — CT CT HEAD W/O CM
3 series · 16 of 47 positions shown, 19 images · non-contrast
Comparison: 11/17/2014

CLINICAL DATA: Altered mental status. Possible medication overdose.

EXAM:
CT HEAD WITHOUT CONTRAST
TECHNIQUE: Contiguous axial images were obtained from the base of the skull
through the vertex without intravenous contrast.

[Series 2: head 5.0 h30s · axial · 0.42mm/px · z∈[-626,-491]mm · 10 of 33 slices shown, 13 images]
[im 3/33  brain]
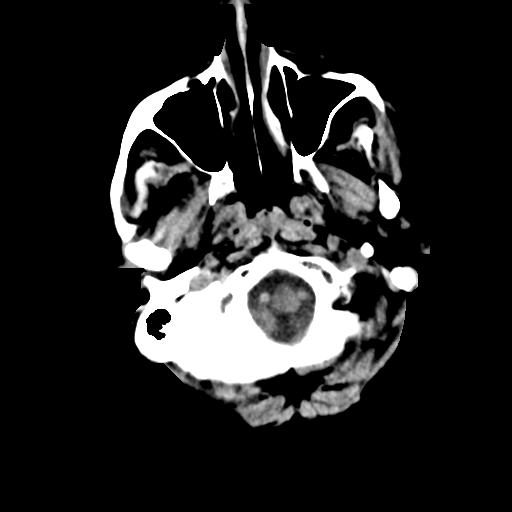
[im 3/33  bone]
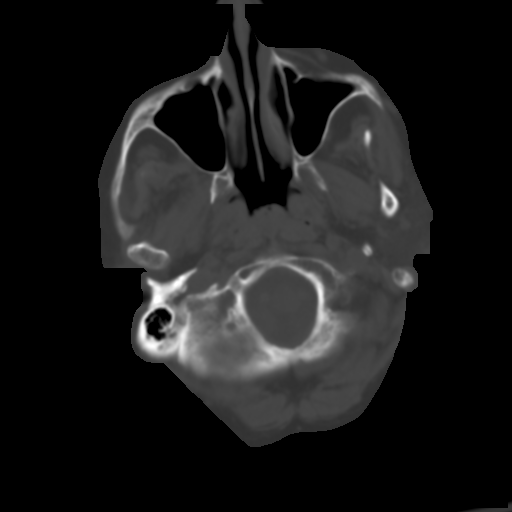
[im 6/33  brain]
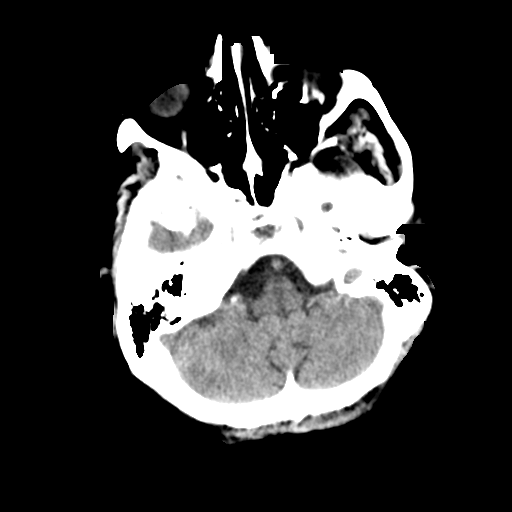
[im 9/33  brain]
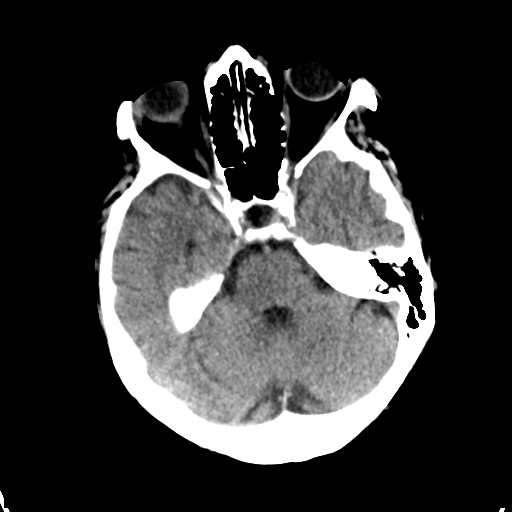
[im 12/33  brain]
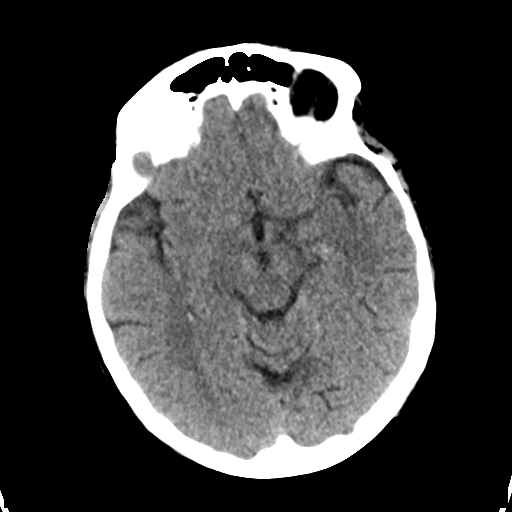
[im 15/33  brain]
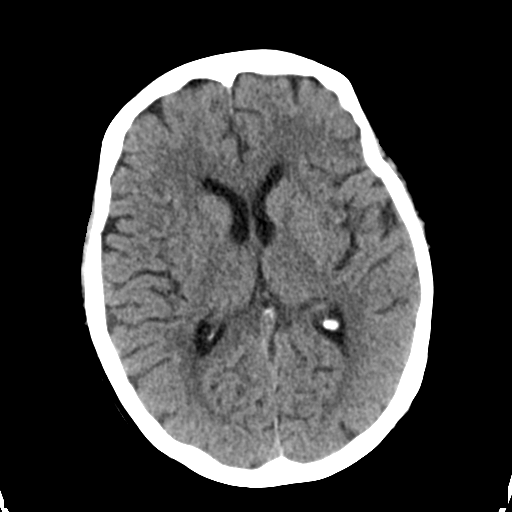
[im 15/33  bone]
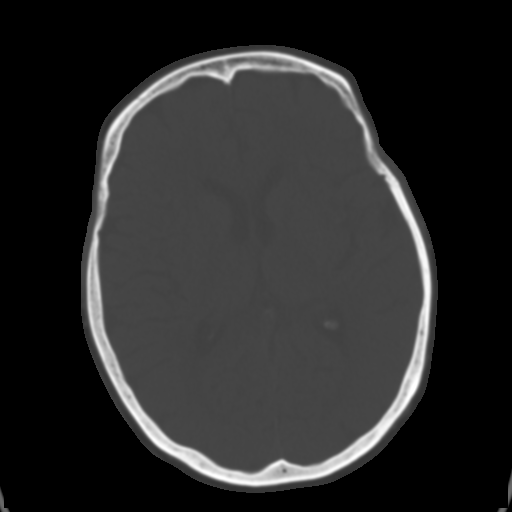
[im 18/33  brain]
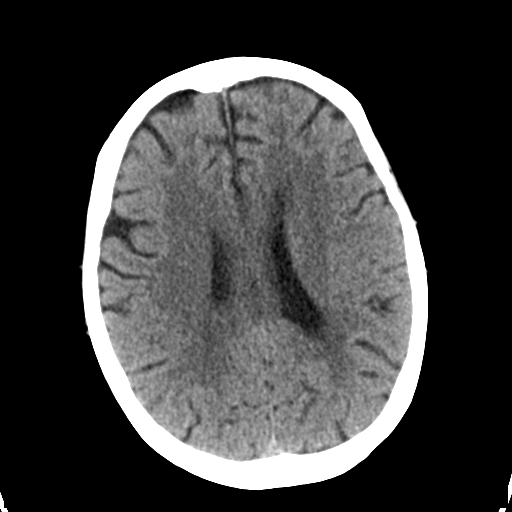
[im 21/33  brain]
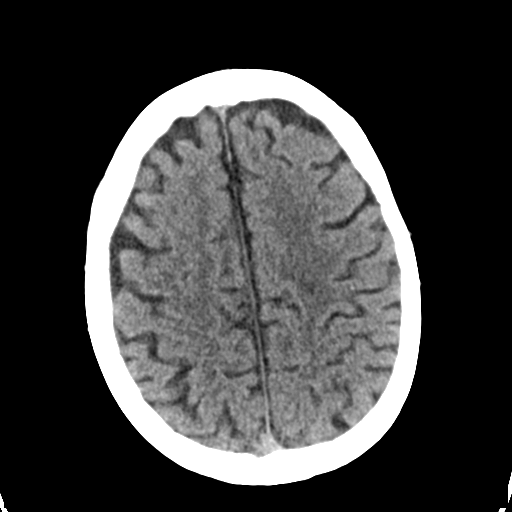
[im 25/33  brain]
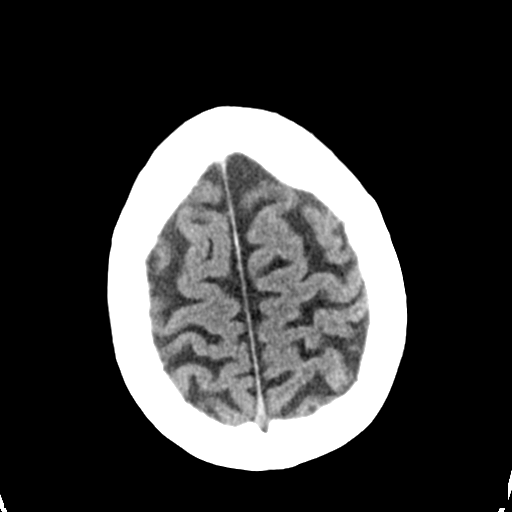
[im 27/33  brain]
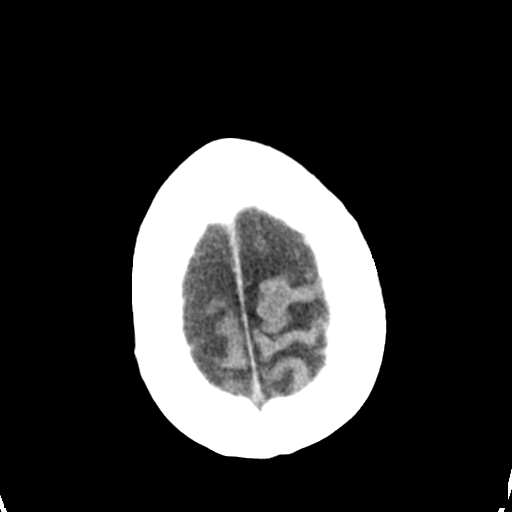
[im 27/33  bone]
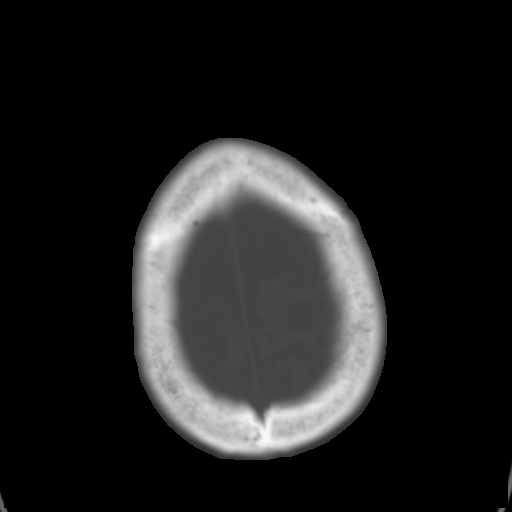
[im 30/33  brain]
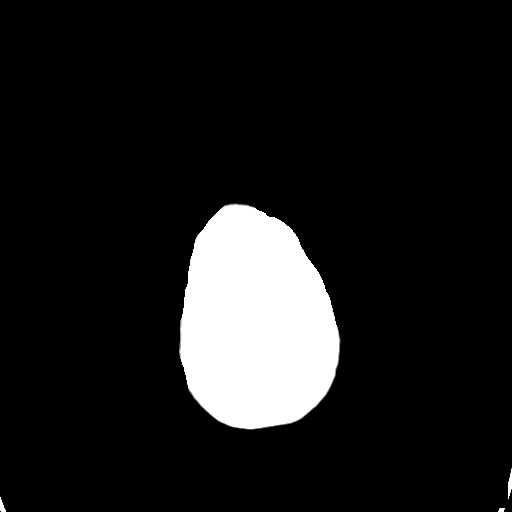

[Series 4: head 3.0 mpr · coronal · 0.32mm/px · 3 of 63 slices shown (1 of 2)]
[im 21/63  brain]
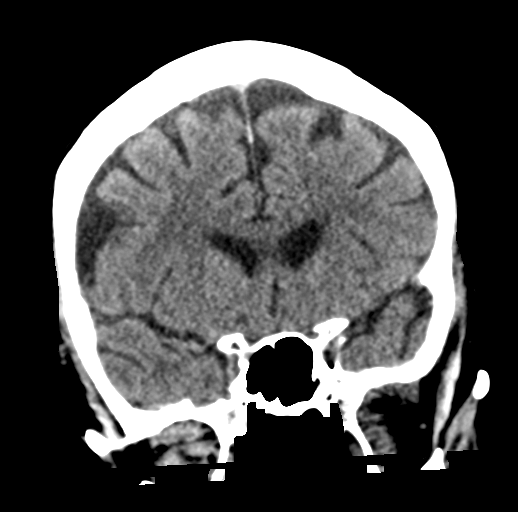
[im 28/63  brain]
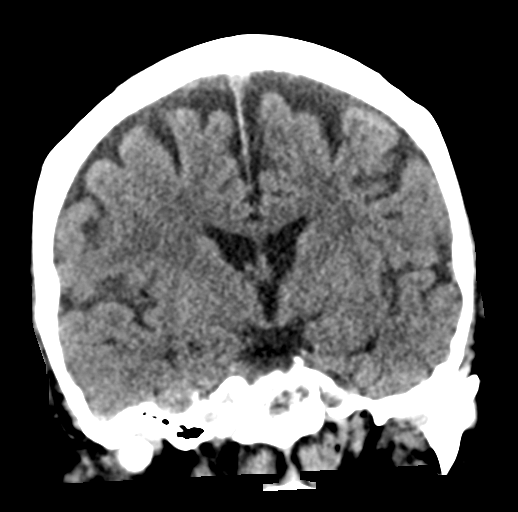
[im 35/63  brain]
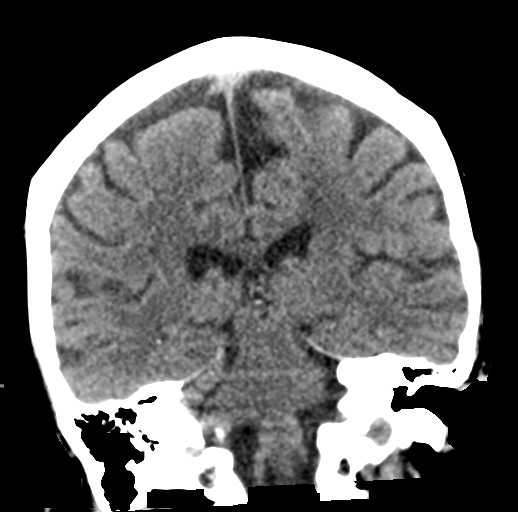

[Series 5: head 3.0 mpr · sagittal · 0.31mm/px · 3 of 53 slices shown (2 of 2)]
[im 18/53  brain]
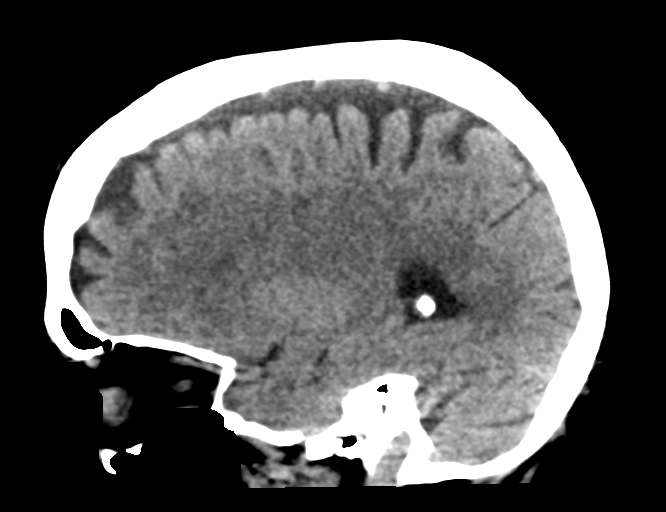
[im 27/53  brain]
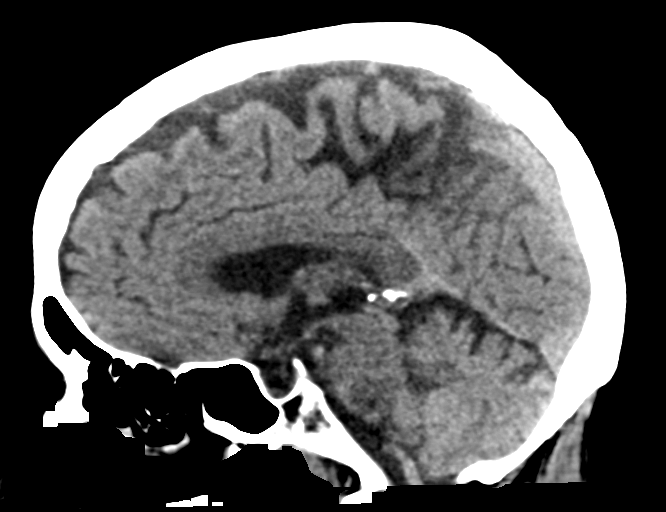
[im 35/53  brain]
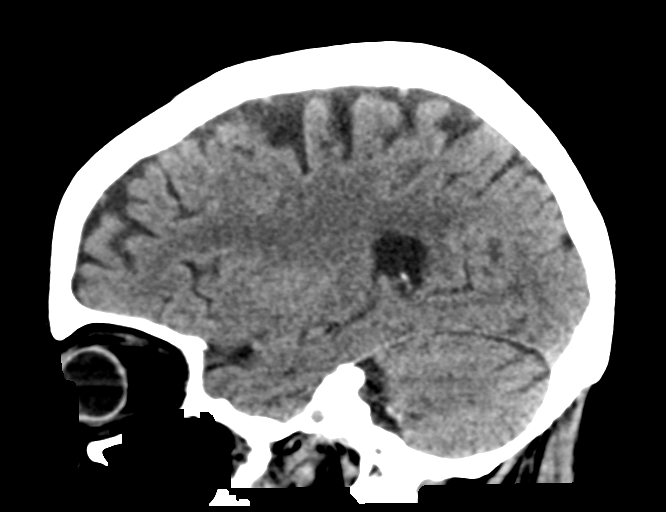

[16 of 47 positions shown; findings below may reference images not displayed]

FINDINGS: Ventricles, cisterns and other CSF spaces are within normal. Minimal
chronic ischemic microvascular disease. No evidence of mass, mass
effect, shift of midline structures or acute hemorrhage. No evidence
of acute infarction. Remaining bones and soft tissues are within
normal.
IMPRESSION: No acute intracranial findings.

Mild chronic ischemic microvascular disease.

## 2016-07-02 ENCOUNTER — Encounter: Payer: Self-pay | Admitting: Pulmonary Disease

## 2016-07-10 ENCOUNTER — Ambulatory Visit: Payer: Medicare Other | Admitting: Pulmonary Disease
